# Patient Record
Sex: Female | Born: 1995 | Race: Black or African American | Hispanic: No | Marital: Single | Smoking: Never smoker
Health system: Southern US, Community
[De-identification: ages and names within clinical notes are randomized; demographics above are authoritative.]

## PROBLEM LIST (undated history)

## (undated) ENCOUNTER — Inpatient Hospital Stay (HOSPITAL_COMMUNITY): Payer: Self-pay

## (undated) ENCOUNTER — Ambulatory Visit (HOSPITAL_COMMUNITY): Discharge status: Absent without leave | Payer: Managed Care, Other (non HMO)

## (undated) DIAGNOSIS — A749 Chlamydial infection, unspecified: Secondary | ICD-10-CM

## (undated) DIAGNOSIS — U071 COVID-19: Secondary | ICD-10-CM

## (undated) DIAGNOSIS — A549 Gonococcal infection, unspecified: Secondary | ICD-10-CM

## (undated) DIAGNOSIS — E119 Type 2 diabetes mellitus without complications: Secondary | ICD-10-CM

## (undated) DIAGNOSIS — R87629 Unspecified abnormal cytological findings in specimens from vagina: Secondary | ICD-10-CM

## (undated) DIAGNOSIS — O1490 Unspecified pre-eclampsia, unspecified trimester: Secondary | ICD-10-CM

## (undated) DIAGNOSIS — A6000 Herpesviral infection of urogenital system, unspecified: Secondary | ICD-10-CM

## (undated) DIAGNOSIS — O139 Gestational [pregnancy-induced] hypertension without significant proteinuria, unspecified trimester: Secondary | ICD-10-CM

## (undated) DIAGNOSIS — L309 Dermatitis, unspecified: Secondary | ICD-10-CM

## (undated) DIAGNOSIS — N390 Urinary tract infection, site not specified: Secondary | ICD-10-CM

## (undated) DIAGNOSIS — O24419 Gestational diabetes mellitus in pregnancy, unspecified control: Secondary | ICD-10-CM

## (undated) HISTORY — PX: LYMPHADENECTOMY: SHX15

## (undated) HISTORY — PX: WISDOM TOOTH EXTRACTION: SHX21

## (undated) HISTORY — DX: Unspecified pre-eclampsia, unspecified trimester: O14.90

---
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
## 2016-03-20 ENCOUNTER — Ambulatory Visit (INDEPENDENT_AMBULATORY_CARE_PROVIDER_SITE_OTHER): Payer: Medicaid Other | Admitting: Pediatrics

## 2016-03-20 ENCOUNTER — Ambulatory Visit: Payer: Medicaid Other | Admitting: Pediatrics

## 2016-03-20 ENCOUNTER — Encounter: Payer: Self-pay | Admitting: Pediatrics

## 2016-03-20 VITALS — BP 139/81 | HR 64 | Ht 71.0 in | Wt 263.8 lb

## 2016-03-20 DIAGNOSIS — Z3040 Encounter for surveillance of contraceptives, unspecified: Secondary | ICD-10-CM

## 2016-03-20 DIAGNOSIS — Z113 Encounter for screening for infections with a predominantly sexual mode of transmission: Secondary | ICD-10-CM | POA: Diagnosis not present

## 2016-03-20 NOTE — Addendum Note (Signed)
Addended by: Ardeth SportsmanHODES, Ingrid Shifrin L on: 03/20/2016 02:39 PM   Modules accepted: Orders

## 2016-03-20 NOTE — Progress Notes (Signed)
THIS RECORD MAY CONTAIN CONFIDENTIAL INFORMATION THAT SHOULD NOT BE RELEASED WITHOUT REVIEW OF THE SERVICE PROVIDER.  Adolescent Medicine Consultation Follow-Up Visit Kelli Mcpherson  is a 21 y.o. female referred by [name], [name], MD here today for follow-up regarding nexplanon.    Last seen in [facility] Clinic on 11/15/15 for vaginal discharge.  Plan at last visit included wet prep. Treated for BV.   - Pertinent Labs? No - Growth Chart Viewed? yes   History was provided by the patient and boyfriend.  PCP Confirmed?  yes  My Chart Activated?   yes    Chief Complaint  Patient presents with  . Follow-up    HPI:   Would like nexplanon out. She is gaining weight. She reports not eating any more than usual. Drinks water and fruit punch but not a lot.  Not trying to get pregnant. Boyfriend is here and would like her to keep nexplanon.  Doesn't feel like she would take pills, doesn't want IUD, concerned about depo weight gain.   24 hour recall:  B: didn't eat- late to class  L: fish sticks, salad ([place]), fruit punch, gatorade  D: chili, water  B; didn't eat   Review of Systems  Constitutional: Negative for malaise/fatigue.  Eyes: Negative for double vision.  Respiratory: Negative for shortness of breath.   Cardiovascular: Negative for chest pain and palpitations.  Gastrointestinal: Negative for abdominal pain, constipation, diarrhea, nausea and vomiting.  Genitourinary: Negative for dysuria.  Musculoskeletal: Negative for joint pain and myalgias.  Skin: Negative for rash.  Neurological: Negative for dizziness and headaches.  Endo/Heme/Allergies: Does not bruise/bleed easily.      No LMP recorded. No Known Allergies Outpatient Medications Prior to Visit  Medication Sig Dispense Refill  . ibuprofen (ADVIL,MOTRIN) 600 MG tablet Take 1 tablet (600 mg total) by mouth every 6 (six) hours as needed for fever or mild pain. 30 tablet 0  .  metroNIDAZOLE (FLAGYL) 500 MG tablet Take 1 tablet (500 mg total) by mouth 2 (two) times daily. 14 tablet 0  . naproxen (NAPROSYN) 500 MG tablet Take 1 tablet (500 mg total) by mouth 2 (two) times daily. 30 tablet 0   No facility-administered medications prior to visit.      Patient Active Problem List   Diagnosis Date Noted  . Surveillance of contraceptive implant 11/15/2015  . Obesity 08/01/2015      The following portions of the patient's history were reviewed and updated as appropriate: allergies, current medications, past family history, past medical history, past social history and problem list.  Physical Exam:  Vitals:   03/20/16 1342  BP: 139/81  Pulse: 64  Weight: 263 lb 12.8 oz (119.7 kg)  Height: 5\' 11"  (1.803 m)   BP 139/81 (BP Location: Right Arm, Patient Position: Sitting, Cuff Size: Large)   Pulse 64   Ht 5\' 11"  (1.803 m)   Wt 263 lb 12.8 oz (119.7 kg)   BMI 36.79 kg/m  Body mass index: body mass index is 36.79 kg/m. Blood pressure percentiles are >99 % systolic and 91 % diastolic based on NHBPEP's 4th Report. Blood pressure percentile targets: 90: 127/80, 95: 130/84, 99 + 5 mmHg: 143/97.   Physical Exam  Constitutional: She appears well-developed. No distress.  HENT:  Mouth/Throat: Oropharynx is clear and moist.  Neck: No thyromegaly present.  Cardiovascular: Normal rate and regular rhythm.   No murmur heard. Pulmonary/Chest: Breath sounds normal.  Abdominal: Soft. She exhibits no mass. There is no  tenderness. There is no guarding.  Musculoskeletal: She exhibits no edema.  Lymphadenopathy:    She has no cervical adenopathy.  Neurological: She is alert.  Skin: Skin is warm. No rash noted.  Psychiatric: She has a normal mood and affect.  Nursing note and vitals reviewed.   Assessment/Plan: 1. Surveillance of contraceptive implant Nexplanon in good position in LUE. Has had some weight gain since placement but also continues in college with fairly  poor eating habits. Has gained weight consistently since 2016 prior to nexplanon. Discussed at length today- she may consider keeping it. Does not desire pregnancy but didn't feel committal to another form of contraception today. She will come back for removal if she would like.    Follow-up:  PRN for nexplanon removal   Medical decision-making:  >15 minutes spent face to face with patient with more than 50% of appointment spent discussing diagnosis, management, follow-up, and reviewing the plan of care as noted above.

## 2016-03-20 NOTE — Patient Instructions (Signed)
Come back and see us if you still want your nexplanon out  Consider exercising more and limiting sugary beverages

## 2016-03-21 LAB — GC/CHLAMYDIA PROBE AMP
CT PROBE, AMP APTIMA: NOT DETECTED
GC PROBE AMP APTIMA: NOT DETECTED

## 2016-08-20 ENCOUNTER — Ambulatory Visit (INDEPENDENT_AMBULATORY_CARE_PROVIDER_SITE_OTHER): Payer: Medicaid Other | Admitting: Student

## 2016-08-20 ENCOUNTER — Encounter: Payer: Self-pay | Admitting: Student

## 2016-08-20 VITALS — Temp 98.0°F | Wt 280.0 lb

## 2016-08-20 DIAGNOSIS — R197 Diarrhea, unspecified: Secondary | ICD-10-CM | POA: Diagnosis not present

## 2016-08-20 DIAGNOSIS — N760 Acute vaginitis: Secondary | ICD-10-CM

## 2016-08-20 DIAGNOSIS — Z3202 Encounter for pregnancy test, result negative: Secondary | ICD-10-CM | POA: Diagnosis not present

## 2016-08-20 DIAGNOSIS — N946 Dysmenorrhea, unspecified: Secondary | ICD-10-CM

## 2016-08-20 LAB — POCT URINE PREGNANCY: PREG TEST UR: NEGATIVE

## 2016-08-20 MED ORDER — FLUCONAZOLE 100 MG PO TABS: 150.0000 mg | ORAL_TABLET | Freq: Once | ORAL | 0 refills | Status: AC

## 2016-08-20 NOTE — Patient Instructions (Addendum)
You were seen today for your cramping, diarrhea, vomiting, and vaginal rash.  We will treat you for a yeast infection and will let you know about your test results. If you have any questions or worsening of any of your symptoms, please call us back or return to care.  The best website for information about children is [email or web address].[email or web address].  All the information is reliable and up-to-date.    At every age, encourage reading.  Reading with your child is one of the best activities you can do.   Use the [facility] near your home and borrow new books every week!  Call the main number [phone] before going to the Emergency Department unless it's a true emergency.  For a true emergency, [facility].   A nurse always answers the main number [phone] and a doctor is always available, even when the clinic is closed.    Clinic is open for sick visits only on Saturday mornings from 8:30AM to 12:30PM. Call first thing on Saturday morning for an appointment.

## 2016-08-20 NOTE — Progress Notes (Signed)
   Subjective:     Kelli Mcpherson, is a 21 y.o. female   History provider by patient No interpreter necessary.  Chief Complaint  Patient presents with  . Diarrhea    x1week  . Vaginal Itching    pt is currently on cylcle but stated that she thinks it could be the pads that she uses    HPI: Kelli Mcpherson is a 20yo with a Nexplanon in place presenting with heavy vaginal bleeding, abdominal cramping, diarrhea, nausea, and vomiting. Since her nexplanon was placed last fall she gets a period almost every month but it is normally light and lasts 2-3 days.   Her period started 6 days ago and she is still on her period today. The bleeding has been much heavier than normal. Has also been associated with cramping, nausea. She also has a vaginal rash that is irritating. She says the rash is painful, itchy, and red and inflamed. A few days before her period started she had a new yellow cloudy mucous vaginal discharge. She was last sexually active two weeks ago with a new partner. She reports using condoms. She denies new feminine products or soaps.  Also reports diarrhea that is frequent and liquidy. Had one episode of NBNB emesis today. Has been able to eat and drink normally.  Review of Systems  Constitutional: Negative for activity change, appetite change and fever.  HENT: Positive for rhinorrhea.   Respiratory: Negative for shortness of breath.   Cardiovascular: Negative for chest pain.  Gastrointestinal: Positive for diarrhea, nausea and vomiting. Negative for blood in stool.  Genitourinary: Positive for pelvic pain and vaginal discharge. Negative for dysuria.  Musculoskeletal: Negative for arthralgias.  Skin: Negative for rash.  Neurological: Negative for headaches.     Patient's history was reviewed and updated as appropriate: allergies, current medications, past medical history, past social history and problem list.     Objective:     Temp 98 F (36.7 C)   Wt 280 lb (127 kg)    LMP 08/14/2016   BMI 39.05 kg/m   Physical Exam  Constitutional:  Obese female, not in distress  HENT:  Head: Normocephalic and atraumatic.  Moist mucus membranes  Eyes: EOM are normal.  Neck: Normal range of motion.  Cardiovascular: Normal rate, regular rhythm and normal heart sounds.   No murmur heard. Pulmonary/Chest: Effort normal and breath sounds normal. She has no wheezes. She has no rales.  Abdominal: Soft. Bowel sounds are normal. She exhibits no distension. There is no tenderness.  Genitourinary:  Genitourinary Comments: Inflamed, pink maculopapular rash posterior to vulva on inferior buttocks  Musculoskeletal: Normal range of motion.  Neurological: She is alert.  Skin: Skin is warm and dry.       Assessment & Plan:   1. Dysmenorrhea in adolescent - POCT urine pregnancy  2. Acute vaginitis - Rash seems consistent with yeast infection. Patient's symptoms of recent vaginal discharge and itchiness in setting of new sexual partner warrant further testing. - WET PREP BY MOLECULAR PROBE - GC/Chlamydia Probe Amp - fluconazole (DIFLUCAN) 100 MG tablet; Take 1.5 tablets (150 mg total) by mouth once.  Dispense: 1.5 tablet; Refill: 0  3. Diarrhea, unspecified type - Likely diarrhea is unrelated to GU symptoms. Most likely cause of diarrhea is viral infection. Reviewed return precautions.  Supportive care and return precautions reviewed.  Return if symptoms worsen or fail to improve.  Randolm IdolSarah Bailie Christenbury, MD [facility], PGY-2 08/20/2016

## 2016-08-21 LAB — WET PREP BY MOLECULAR PROBE
CANDIDA SPECIES: NOT DETECTED
Gardnerella vaginalis: NOT DETECTED
Trichomonas vaginosis: DETECTED — AB

## 2016-08-21 LAB — GC/CHLAMYDIA PROBE AMP
CT Probe RNA: DETECTED — AB
GC Probe RNA: NOT DETECTED

## 2016-08-21 NOTE — Progress Notes (Signed)
Phone call to Kelli SquiresShaquasia was not answered.  Voicemail left without details, with promise to send message by MyChart.  Infection needs treatment, which can be either one pill twice a day for 7 days, or larger dose once.  [facility]   Also promised to call again on Thursday afternoon from the office.

## 2016-08-22 ENCOUNTER — Other Ambulatory Visit: Payer: Self-pay | Admitting: Pediatrics

## 2016-08-22 DIAGNOSIS — A599 Trichomoniasis, unspecified: Secondary | ICD-10-CM

## 2016-08-22 MED ORDER — FLUCONAZOLE 150 MG PO TABS: 150.0000 mg | ORAL_TABLET | Freq: Once | ORAL | 0 refills | Status: AC

## 2016-08-22 MED ORDER — METRONIDAZOLE 500 MG PO TABS: 500.0000 mg | ORAL_TABLET | Freq: Two times a day (BID) | ORAL | 0 refills | Status: DC

## 2016-08-22 NOTE — Progress Notes (Signed)
Spoke with Kelli Mcpherson, who said she prefers metronidazole 7-day treatment over single dose. Also Diflucan as written was not covered by Medicaid, according to pharmacy.  Will reorder as generic with specific note in prescription.  August 20, 2016 [facility] generic but not Diflucan.

## 2016-08-25 ENCOUNTER — Encounter: Payer: Self-pay | Admitting: Pediatrics

## 2016-08-25 ENCOUNTER — Ambulatory Visit (INDEPENDENT_AMBULATORY_CARE_PROVIDER_SITE_OTHER): Payer: Medicaid Other | Admitting: Pediatrics

## 2016-08-25 VITALS — Temp 98.9°F | Wt 274.8 lb

## 2016-08-25 DIAGNOSIS — A64 Unspecified sexually transmitted disease: Secondary | ICD-10-CM

## 2016-08-25 NOTE — Patient Instructions (Signed)
You have been treated today for chlamydia and you have medicine to treat your last partner. The Health Department may contact you to give and get more information.   Chlamydia, Female Chlamydia is an STD (sexually transmitted disease). This is an infection that spreads through sexual contact. If it is not treated, it can cause serious problems. It must be treated with antibiotic medicine. Sometimes, you may not have symptoms (asymptomatic). When you have symptoms, they can include:  Burning when you pee (urinate).  Peeing often.  Fluid (discharge) coming from the vagina.  Redness, soreness, and swelling (inflammation) of the butt (rectum).  Bleeding or fluid coming from the butt.  Belly (abdominal) pain.  Pain during sex.  Bleeding between periods.  Itching, burning, or redness in the eyes.  Fluid coming from the eyes.  Follow these instructions at home: Medicines  Take over-the-counter and prescription medicines only as told by your doctor.  Take your antibiotic medicine as told by your doctor. Do not stop taking the antibiotic even if you start to feel better. Sexual activity  Tell sex partners about your infection. Sex partners are people you had oral, anal, or vaginal sex with within 60 days of when you started getting sick. They need treatment, too.  Do not have sex until: ? You and your sex partners have been treated. ? Your doctor says it is okay.  If you have a single dose treatment, wait 7 days before having sex. General instructions  It is up to you to get your test results. Ask your doctor when your results will be ready.  Get a lot of rest.  Eat healthy foods.  Drink enough fluid to keep your pee (urine) clear or pale yellow.  Keep all follow-up visits as told by your doctor. You may need tests after 3 months. Preventing chlamydia  The only way to prevent chlamydia is not to have sex. To lower your risk: ? Use latex condoms correctly. Do this every  time you have sex. ? Avoid having many sex partners. ? Ask if your partner has been tested for STDs and if he or she had negative results. Contact a doctor if:  You get new symptoms.  You do not get better with treatment.  You have a fever or chills.  You have pain during sex. Get help right away if:  Your pain gets worse and does not get better with medicine.  You get flu-like symptoms, such as: ? Night sweats. ? Sore throat. ? Muscle aches.  You feel sick to your stomach (nauseous).  You throw up (vomit).  You have trouble swallowing.  You have bleeding: ? Between periods. ? After sex.  You have irregular periods.  You have belly pain that does not get better with medicine.  You have lower back pain that does not get better with medicine.  You feel weak or dizzy.  You pass out (faint).  You are pregnant and you get symptoms of chlamydia. Summary  Chlamydia is an infection that spreads through sexual contact.  Sometimes, chlamydia can cause no symptoms (asymptomatic).  Do not have sex until your doctor says it is okay.  All sex partners will have to be treated for chlamydia.

## 2016-08-25 NOTE — Progress Notes (Signed)
    Assessment and Plan:     1. STI (sexually transmitted infection) x 2  Taking medication for Trich as ordered Directly observed therapy for chlamydia today EPT given for partner Stressed condom use  [facility] report faxed  Return in about 3 weeks (around 09/15/2016) for medication response follow up with Dr Lubertha SouthProse.    Subjective:  HPI Kelli Mcpherson is a 21 y.o. old female here with herself  Chief Complaint  Patient presents with  . Follow-up   Positive chlamydia result after communications about wet prep positive for Trich and prescription successfully made Feeling much better with less itching Diarrhea and nausea had already cleared  Immunizations, medications and allergies were reviewed and updated. Family history and social history were reviewed and updated.   Review of Systems No fever No back pain No abdominal pain No sore throat  History and Problem List: Kelli Mcpherson has Obesity and Surveillance of contraceptive implant on her problem list.  Kelli Mcpherson  has a past medical history of Eczema.  Objective:   Temp 98.9 F (37.2 C) (Temporal)   Wt 274 lb 12.8 oz (124.6 kg)   LMP 08/20/2016   BMI 38.33 kg/m  Physical Exam  Constitutional: She is oriented to person, place, and time. She appears well-developed.  Obese  HENT:  Left Ear: External ear normal.  Nose: Nose normal.  Mouth/Throat: Oropharynx is clear and moist.  Eyes: Conjunctivae and EOM are normal.  Neck: Neck supple. No thyromegaly present.  Cardiovascular: Normal rate, regular rhythm and normal heart sounds.   Pulmonary/Chest: Effort normal and breath sounds normal.  Abdominal: Soft. Bowel sounds are normal. There is no tenderness.  Neurological: She is alert and oriented to person, place, and time.  Skin: Skin is warm and dry. No rash noted.  Nursing note and vitals reviewed.   Leda MinPROSE, CLAUDIA, MD

## 2016-09-15 ENCOUNTER — Ambulatory Visit: Payer: Self-pay

## 2016-09-15 ENCOUNTER — Ambulatory Visit: Payer: Medicaid Other | Admitting: Pediatrics

## 2016-09-16 ENCOUNTER — Ambulatory Visit: Payer: Medicaid Other

## 2016-09-16 DIAGNOSIS — Z113 Encounter for screening for infections with a predominantly sexual mode of transmission: Secondary | ICD-10-CM

## 2016-09-16 NOTE — Progress Notes (Signed)
Here for STD treatment follow up from 08/25/16. Patient completed medication and is not having any current symptoms. Urine collected.

## 2016-09-17 LAB — GC/CHLAMYDIA PROBE AMP
CT Probe RNA: NOT DETECTED
GC Probe RNA: NOT DETECTED

## 2016-09-17 NOTE — Progress Notes (Signed)
Test from yesterday shows NO infection.  You did a good job taking the medicine and staying safe.   Call if and when you need to.

## 2016-10-23 ENCOUNTER — Ambulatory Visit: Payer: Self-pay | Admitting: Pediatrics

## 2016-10-23 ENCOUNTER — Telehealth: Payer: Self-pay

## 2016-10-23 NOTE — Telephone Encounter (Addendum)
Mom called very upset because patient was unable to be seen today for Nexplanon removal. Explained to pt that if patient consents to Nexplanon removal that procedure may be attempted that day. Reassured patient that because appointment note states "follow up" that doesn't mean procedure will not be done that day. Pt voiced understanding and plans to be at appt on 10/5.

## 2016-10-24 ENCOUNTER — Ambulatory Visit (INDEPENDENT_AMBULATORY_CARE_PROVIDER_SITE_OTHER): Payer: Medicaid Other | Admitting: Family

## 2016-10-24 ENCOUNTER — Encounter: Payer: Self-pay | Admitting: Family

## 2016-10-24 VITALS — BP 146/81 | HR 86 | Ht 72.24 in | Wt 285.8 lb

## 2016-10-24 DIAGNOSIS — Z30432 Encounter for removal of intrauterine contraceptive device: Secondary | ICD-10-CM

## 2016-10-24 DIAGNOSIS — Z3046 Encounter for surveillance of implantable subdermal contraceptive: Secondary | ICD-10-CM | POA: Diagnosis not present

## 2016-10-24 NOTE — Patient Instructions (Signed)
Follow-up  in 1 month. Schedule this appointment before you leave clinic today.  Your Nexplanon was removed today and is no longer preventing pregnancy.  If you have sex, remember to use condoms to prevent pregnancy and to prevent sexually transmitted infections.  Leave the outside bandage on for 24 hours.  Leave the smaller bandages on for 3-5 days or until they fall off on their own.  Keep the area clean and dry for 3-5 days.  There is usually bruising or swelling at and around the removal site for a few days to a week after the removal.  If you see redness or pus draining from the removal site, call us immediately.  We would like you to return to the clinic for a follow-up visit in 1 month.  You can call [facility] 24 hours a day with any questions or concerns.  There is always a nurse or doctor available to take your call.  Call 9-1-1 if you have a life-threatening emergency.  For anything else, please call us at [phone] before heading to the ER.  

## 2016-10-27 ENCOUNTER — Encounter: Payer: Self-pay | Admitting: Family

## 2016-10-27 NOTE — Progress Notes (Signed)
THIS RECORD MAY CONTAIN CONFIDENTIAL INFORMATION THAT SHOULD NOT BE RELEASED WITHOUT REVIEW OF THE SERVICE PROVIDER.  Adolescent Medicine Consultation Follow-Up Visit Kelli Mcpherson  is a 21 y.o. female referred by Tilman Neat, MD here today for follow-up regarding nexplanon.    Last seen in [facility] on 03/20/16 for nexplanon surveillance.   Pertinent Labs? No Growth Chart Viewed? no   History was provided by the patient.  Interpreter? no  PCP Confirmed?  yes  My Chart Activated?   yes    Chief Complaint  Patient presents with  . Follow-up    would like nexplanon removed    HPI:    -desire nexplanon out -does not want another form of birth control  -sexually active without pregnancy intent -declines further conversation regarding methods.   Review of Systems  Constitutional: Negative for fever and malaise/fatigue.  Eyes: Negative for double vision.  Cardiovascular: Negative for chest pain.  Gastrointestinal: Negative for abdominal pain and nausea.  Skin: Negative for rash.  Neurological: Negative for dizziness, tingling and tremors.  Endo/Heme/Allergies: Does not bruise/bleed easily.  Psychiatric/Behavioral: Negative for depression and suicidal ideas.   No LMP recorded. No Known Allergies No outpatient prescriptions prior to visit.   No facility-administered medications prior to visit.      Patient Active Problem List   Diagnosis Date Noted  . Surveillance of contraceptive implant 11/15/2015  . Obesity 08/01/2015  Physical Exam:  Vitals:   10/24/16 1555  BP: (!) 146/81  Pulse: 86  Weight: 285 lb 12.8 oz (129.6 kg)  Height: 6' 0.24" (1.835 m)   BP (!) 146/81 (BP Location: Right Arm, Patient Position: Sitting, Cuff Size: Normal)   Pulse 86   Ht 6' 0.24" (1.835 m)   Wt 285 lb 12.8 oz (129.6 kg)   BMI 38.50 kg/m  Body mass index: body mass index is 38.5 kg/m. Blood pressure percentiles are >99 % systolic and 92 % diastolic  based on the August 2017 AAP Clinical Practice Guideline. Blood pressure percentile targets: 90: 128/78, 95: 130/81, 95 + 12 mmHg: 142/93. This reading is in the Stage 2 hypertension range (BP >= 140/90).   Physical Exam  Constitutional: She is oriented to person, place, and time. She appears well-developed. No distress.  Eyes: No scleral icterus.  Cardiovascular: Normal rate and regular rhythm.   No murmur heard. Pulmonary/Chest: Effort normal and breath sounds normal.  Musculoskeletal: Normal range of motion. She exhibits no edema.  Neurological: She is alert and oriented to person, place, and time. No cranial nerve deficit.  Skin: Skin is warm and dry. No rash noted.  nexplanon palpable at correct site just prior to removal. See procedure note.   Psychiatric: She has a normal mood and affect.   Assessment/Plan: 1. Encounter for Nexplanon removal Risks & benefits of Nexplanon removal discussed. Consent form signed.  The patient denies any allergies to anesthetics or antiseptics.  Procedure: Pt was placed in supine position. left arm was flexed at the elbow and externally rotated so that her wrist was parallel to her ear, The device was palpated and marked. The site was cleaned with Betadine. The area surrounding the device was covered with a sterile drape. 1% lidocaine was injected just under the device. A scalpel was used to create a small incision. The device was pushed towards the incision. Fibrous tissue surrounding the device was gradually removed from the device. The device was removed and measured to ensure all 4 cm of device was removed.  Steri-strips were used to close the incision. Pressure dressing was applied to the patient.  The patient was instructed to removed the pressure dressing in 24 hrs.  The patient was advised to move slowly from a supine to an upright position  The patient denied any concerns or complaints  The patient was instructed to schedule a  follow-up appt in 1 month. The patient will be called in 1 week to address any concerns.   Follow-up:  11/28/16 at 2pm with Christianne Dolin, FNP-C  Medical decision-making:  >15 minutes spent face to face with patient with more than 50% of appointment spent discussing procedure, return precautions, and follow up care.

## 2016-11-17 ENCOUNTER — Encounter: Payer: Self-pay | Admitting: Pediatrics

## 2016-11-17 ENCOUNTER — Ambulatory Visit (INDEPENDENT_AMBULATORY_CARE_PROVIDER_SITE_OTHER): Payer: Medicaid Other | Admitting: Pediatrics

## 2016-11-17 VITALS — BP 118/78 | Ht 72.24 in | Wt 275.0 lb

## 2016-11-17 DIAGNOSIS — L309 Dermatitis, unspecified: Secondary | ICD-10-CM | POA: Diagnosis not present

## 2016-11-17 DIAGNOSIS — Z113 Encounter for screening for infections with a predominantly sexual mode of transmission: Secondary | ICD-10-CM | POA: Diagnosis not present

## 2016-11-17 DIAGNOSIS — N760 Acute vaginitis: Secondary | ICD-10-CM

## 2016-11-17 DIAGNOSIS — Z3202 Encounter for pregnancy test, result negative: Secondary | ICD-10-CM

## 2016-11-17 LAB — POCT URINE PREGNANCY: Preg Test, Ur: NEGATIVE

## 2016-11-17 MED ORDER — TRIAMCINOLONE ACETONIDE 0.1 % EX OINT: 1.0000 "application " | TOPICAL_OINTMENT | Freq: Two times a day (BID) | CUTANEOUS | 4 refills | Status: DC

## 2016-11-17 NOTE — Patient Instructions (Signed)

## 2016-11-17 NOTE — Progress Notes (Signed)
    Subjective:    Kelli Mcpherson is a 21 y.o. female accompanied by self presenting to the clinic today with a chief c/o of vaginal discharge last week that seems to have resolved. She had some itching & discharge but not anymore.  LMP last week & this was the 1st cycle after removal of Nexplanon. She had it taken out on 10/24/16 due to irregular spotting & also thought it was causing weight gain. No c/o abdominal pain, no dysuria or dyspareunia. Pt is sexually active & using condoms. Does not want any alternate birth control. She has lost 10 lbs since her nexplanon was taken out & was resistant to discussing birth control options.  She also wanted medications for eczema flare up. Prev on hydrocortisone but not helping. Usually has flare up in the winter- mostly hands.  Review of Systems  Constitutional: Negative for activity change, appetite change, fatigue and fever.  HENT: Negative for congestion.   Respiratory: Negative for cough, shortness of breath and wheezing.   Gastrointestinal: Negative for abdominal pain, diarrhea, nausea and vomiting.  Genitourinary: Positive for vaginal discharge. Negative for difficulty urinating, dyspareunia and dysuria.  Skin: Negative for rash.  Neurological: Negative for headaches.  Psychiatric/Behavioral: Negative for sleep disturbance.       Objective:   Physical Exam  Constitutional: She appears well-nourished. No distress.  HENT:  Head: Normocephalic and atraumatic.  Right Ear: External ear normal.  Left Ear: External ear normal.  Nose: Nose normal.  Mouth/Throat: Oropharynx is clear and moist.  Eyes: Conjunctivae and EOM are normal. Right eye exhibits no discharge. Left eye exhibits no discharge.  Neck: Normal range of motion.  Cardiovascular: Normal rate, regular rhythm and normal heart sounds.   Pulmonary/Chest: No respiratory distress. She has no wheezes. She has no rales.  Abdominal: Soft. There is no tenderness. There is no  rebound and no guarding.  Skin: Skin is warm and dry. No rash noted.  Nursing note and vitals reviewed.  .BP 118/78   Ht 6' 0.24" (1.835 m)   Wt 275 lb (124.7 kg)   LMP 10/27/2016   BMI 37.05 kg/m         Assessment & Plan:  1. Acute vaginitis Discussed hygiene & avoid douching - WET PREP BY MOLECULAR PROBE STI screen done - C. trachomatis/N. gonorrhoeae RNA - POCT urine pregnancy: negative  2. Eczema, unspecified type Skin care discussed - triamcinolone ointment (KENALOG) 0.1 %; Apply 1 application topically 2 (two) times daily.  Dispense: 80 g; Refill: 4  Patient wanted to cancel [facility] appt as she is not interested in discussing birth control options.  Return if symptoms worsen or fail to improve.  Tobey BrideShruti Ziggy Chanthavong, MD 11/17/2016 3:29 PM

## 2016-11-18 LAB — C. TRACHOMATIS/N. GONORRHOEAE RNA
C. TRACHOMATIS RNA, TMA: DETECTED — AB
N. gonorrhoeae RNA, TMA: NOT DETECTED

## 2016-11-18 LAB — WET PREP BY MOLECULAR PROBE
Candida species: NOT DETECTED
MICRO NUMBER: 81209378
SPECIMEN QUALITY: ADEQUATE
Trichomonas vaginosis: NOT DETECTED

## 2016-11-19 ENCOUNTER — Other Ambulatory Visit: Payer: Self-pay | Admitting: Pediatrics

## 2016-11-19 DIAGNOSIS — N760 Acute vaginitis: Principal | ICD-10-CM

## 2016-11-19 DIAGNOSIS — B9689 Other specified bacterial agents as the cause of diseases classified elsewhere: Secondary | ICD-10-CM

## 2016-11-19 DIAGNOSIS — A64 Unspecified sexually transmitted disease: Secondary | ICD-10-CM

## 2016-11-19 MED ORDER — METRONIDAZOLE 500 MG PO TABS: 500.0000 mg | ORAL_TABLET | Freq: Two times a day (BID) | ORAL | 0 refills | Status: AC

## 2016-11-19 MED ORDER — AZITHROMYCIN 1 G PO PACK: 1.0000 g | PACK | Freq: Once | ORAL | 0 refills | Status: AC

## 2016-11-19 NOTE — Progress Notes (Signed)
VM not set up. Will try again later.

## 2016-11-20 ENCOUNTER — Encounter: Payer: Self-pay | Admitting: Pediatrics

## 2016-11-20 ENCOUNTER — Ambulatory Visit (INDEPENDENT_AMBULATORY_CARE_PROVIDER_SITE_OTHER): Payer: Medicaid Other | Admitting: Pediatrics

## 2016-11-20 VITALS — Temp 98.3°F | Wt 282.2 lb

## 2016-11-20 DIAGNOSIS — A64 Unspecified sexually transmitted disease: Secondary | ICD-10-CM | POA: Diagnosis not present

## 2016-11-20 MED ORDER — AZITHROMYCIN 250 MG PO TABS: 1000.0000 mg | ORAL_TABLET | Freq: Every day | ORAL | Status: DC

## 2016-11-20 NOTE — Progress Notes (Signed)
    Assessment and Plan:     1. Sexually transmitted infection Chlamydia treated with azithromycin 1000 mg.  Witnessed.   Partner therapy given and witnessed. Medication for BV to be picked up and started today. - azithromycin (ZITHROMAX) tablet 1,000 mg; Take 4 tablets (1,000 mg total) by mouth daily. Package of condoms given to each Check with Dr Wynetta EmerySimha if [facility] needs [facility].  Too old for flu shot in clinic.  Return for any new symptoms or concerns.    Subjective:  HPI Kelli Mcpherson is a 21 y.o. old female here with boyfriend Kelli Mcpherson  Chief Complaint  Patient presents with  . Follow-up    Follow Up for infection    Feeling fine.  Last infection felt a little different. Plans to get medication ordered by Dr Wynetta EmerySimha for BV but wanted to come for the other infection (chlamydia) Told Dr Wynetta EmerySimha that she would come to clinic for medicine Came to clinic with partner Kelli Mcpherson so he can get treated Kelli Mcpherson has been incarcerated (no details) and recalls judge told him he cannot vote Neither volunteers info about any other partners  Fever: no Change in appetite: no Change in sleep: no Change in breathing: no Vomiting: no Diarrhea: no Change in stool: no Change in urine: no Change in skin: no  Sick contacts:  Kelli Mcpherson Smoke: no Travel: no  Immunizations, medications and allergies were reviewed and updated. Family history and social history were reviewed and updated.   Review of Systems See HPI  History and Problem List: Kelli Mcpherson has Obesity on her problem list.  Kelli Mcpherson  has a past medical history of Eczema.  Objective:   Temp 98.3 F (36.8 C) (Temporal)   Wt 282 lb 3.2 oz (128 kg)   LMP 10/27/2016   BMI 38.02 kg/m  Physical Exam  Constitutional: She is oriented to person, place, and time. She appears well-developed.  Obese, agreeable  HENT:  Right Ear: External ear normal.  Left Ear: External ear normal.  Nose: Nose normal.  Mouth/Throat:  Oropharynx is clear and moist.  Eyes: Conjunctivae and EOM are normal.  Neck: Neck supple. No thyromegaly present.  Cardiovascular: Normal rate, regular rhythm and normal heart sounds.   Pulmonary/Chest: Effort normal and breath sounds normal.  Abdominal: Soft. Bowel sounds are normal.  Genitourinary:  Genitourinary Comments: Deferred.  Police presence in clinic due to suspicion for bomb package sent to one doctor.   Neurological: She is alert and oriented to person, place, and time.  Skin: Skin is warm and dry. No rash noted.  Nursing note and vitals reviewed.   Leda MinPROSE, CLAUDIA, MD

## 2016-11-20 NOTE — Patient Instructions (Signed)
Be sure and get the medication ordered by Dr Wynetta EmerySimha.  Take it for the full 7days. Come back whenever you feel not right, or either you or [name] has a different partner.

## 2016-11-28 ENCOUNTER — Ambulatory Visit: Payer: Medicaid Other | Admitting: Family

## 2017-02-25 ENCOUNTER — Ambulatory Visit
Admission: RE | Admit: 2017-02-25 | Discharge: 2017-02-25 | Disposition: A | Discharge status: Home / Self Care | Payer: Medicaid Other | Source: Ambulatory Visit | Attending: Pediatrics | Admitting: Pediatrics

## 2017-02-25 ENCOUNTER — Ambulatory Visit (INDEPENDENT_AMBULATORY_CARE_PROVIDER_SITE_OTHER): Payer: Self-pay | Admitting: Pediatrics

## 2017-02-25 VITALS — BP 134/94 | HR 99 | Temp 98.6°F | Wt 277.6 lb

## 2017-02-25 DIAGNOSIS — R05 Cough: Secondary | ICD-10-CM

## 2017-02-25 DIAGNOSIS — R059 Cough, unspecified: Secondary | ICD-10-CM

## 2017-02-25 DIAGNOSIS — Z113 Encounter for screening for infections with a predominantly sexual mode of transmission: Secondary | ICD-10-CM

## 2017-02-25 DIAGNOSIS — B9789 Other viral agents as the cause of diseases classified elsewhere: Secondary | ICD-10-CM

## 2017-02-25 DIAGNOSIS — J069 Acute upper respiratory infection, unspecified: Secondary | ICD-10-CM

## 2017-02-25 MED ORDER — IBUPROFEN 200 MG PO TABS: 800.0000 mg | ORAL_TABLET | Freq: Three times a day (TID) | ORAL | 0 refills | Status: DC | PRN

## 2017-02-25 NOTE — Patient Instructions (Signed)

## 2017-02-25 NOTE — Progress Notes (Signed)
  History was provided by the patient.  No interpreter necessary.  Kelli Mcpherson is a 22 y.o. female presents for  Chief Complaint  Patient presents with  . Cough    since monday no meds taken today  . Headache  . Breathing Problem   For 3 days has had about symptoms.  Headache is frontal. Not taking any medications for symptoms.  Chest pain over the sternum. No fevers.    The following portions of the patient's history were reviewed and updated as appropriate: allergies, current medications, past family history, past medical history, past social history, past surgical history and problem list.  Review of Systems  Constitutional: Negative for fever.  HENT: Positive for congestion. Negative for ear discharge and ear pain.   Eyes: Negative for pain and discharge.  Respiratory: Positive for cough. Negative for shortness of breath and wheezing.   Gastrointestinal: Negative for diarrhea and vomiting.  Skin: Negative for rash.  Neurological: Positive for headaches.     Physical Exam:  BP (!) 134/94   Pulse 99   Temp 98.6 F (37 C) (Temporal)   Wt 277 lb 9.6 oz (125.9 kg)   LMP 02/18/2017   SpO2 94%   BMI 37.40 kg/m  Growth percentile SmartLinks can only be used for patients less than 21 years old. Wt Readings from Last 3 Encounters:  02/25/17 277 lb 9.6 oz (125.9 kg)  11/20/16 282 lb 3.2 oz (128 kg) (>99 %, Z= 2.76)*  11/17/16 275 lb (124.7 kg) (>99 %, Z= 2.71)*   * Growth percentiles are based on CDC (Girls, 2-20 Years) data.   HR: 90 RR: 18  General:   alert, cooperative, appears stated age and no distress  Oral cavity:   lips, mucosa, and tongue normal; moist mucus membranes   EENT:   sclerae white, normal TM bilaterally, no drainage from nares, tonsils are normal, no cervical lymphadenopathy   Lungs:  clear to auscultation bilaterally, tenderness over the sternum   Heart:   regular rate and rhythm, S1, S2 normal, no murmur, click, rub or gallop       Assessment/Plan: 1. Viral URI with cough Xray was negative.  Chest pain is most likely musculoskeletal so wrote script for motrin at home.   - DG Chest 2 View; Future - ibuprofen (MOTRIN IB) 200 MG tablet; Take 4 tablets (800 mg total) by mouth every 8 (eight) hours as needed for mild pain.  Dispense: 30 tablet; Refill: 0  2. Routine screening for STI (sexually transmitted infection) Positive chlamydia at the last visit.  - C. trachomatis/N. gonorrhoeae RNA     Cherece Griffith CitronNicole Grier, MD  02/25/17

## 2017-02-26 ENCOUNTER — Encounter: Payer: Self-pay | Admitting: Pediatrics

## 2017-02-26 LAB — C. TRACHOMATIS/N. GONORRHOEAE RNA
C. trachomatis RNA, TMA: NOT DETECTED
N. gonorrhoeae RNA, TMA: NOT DETECTED

## 2017-03-27 ENCOUNTER — Other Ambulatory Visit: Payer: Self-pay

## 2017-03-27 ENCOUNTER — Encounter: Payer: Self-pay | Admitting: Pediatrics

## 2017-03-27 ENCOUNTER — Ambulatory Visit (INDEPENDENT_AMBULATORY_CARE_PROVIDER_SITE_OTHER): Payer: Medicaid Other | Admitting: Pediatrics

## 2017-03-27 VITALS — BP 120/90 | Temp 97.2°F | Wt 279.2 lb

## 2017-03-27 DIAGNOSIS — Z3202 Encounter for pregnancy test, result negative: Secondary | ICD-10-CM

## 2017-03-27 DIAGNOSIS — Z3049 Encounter for surveillance of other contraceptives: Secondary | ICD-10-CM

## 2017-03-27 DIAGNOSIS — Z113 Encounter for screening for infections with a predominantly sexual mode of transmission: Secondary | ICD-10-CM | POA: Diagnosis not present

## 2017-03-27 DIAGNOSIS — Z3009 Encounter for other general counseling and advice on contraception: Secondary | ICD-10-CM

## 2017-03-27 LAB — POCT URINE PREGNANCY: PREG TEST UR: NEGATIVE

## 2017-03-27 NOTE — Patient Instructions (Signed)
Contraception Choices Contraception, also called birth control, refers to methods or devices that prevent pregnancy. Hormonal methods Contraceptive implant A contraceptive implant is a thin, plastic tube that contains a hormone. It is inserted into the upper part of the arm. It can remain in place for up to 3 years. Progestin-only injections Progestin-only injections are injections of progestin, a synthetic form of the hormone progesterone. They are given every 3 months by a health care provider. Birth control pills Birth control pills are pills that contain hormones that prevent pregnancy. They must be taken once a day, preferably at the same time each day. Birth control patch The birth control patch contains hormones that prevent pregnancy. It is placed on the skin and must be changed once a week for three weeks and removed on the fourth week. A prescription is needed to use this method of contraception. Vaginal ring A vaginal ring contains hormones that prevent pregnancy. It is placed in the vagina for three weeks and removed on the fourth week. After that, the process is repeated with a new ring. A prescription is needed to use this method of contraception. Emergency contraceptive Emergency contraceptives prevent pregnancy after unprotected sex. They come in pill form and can be taken up to 5 days after sex. They work best the sooner they are taken after having sex. Most emergency contraceptives are available without a prescription. This method should not be used as your only form of birth control. Barrier methods Female condom A female condom is a thin sheath that is worn over the penis during sex. Condoms keep sperm from going inside a woman's body. They can be used with a spermicide to increase their effectiveness. They should be disposed after a single use. Female condom A female condom is a soft, loose-fitting sheath that is put into the vagina before sex. The condom keeps sperm from going  inside a woman's body. They should be disposed after a single use. Diaphragm A diaphragm is a soft, dome-shaped barrier. It is inserted into the vagina before sex, along with a spermicide. The diaphragm blocks sperm from entering the uterus, and the spermicide kills sperm. A diaphragm should be left in the vagina for 6-8 hours after sex and removed within 24 hours. A diaphragm is prescribed and fitted by a health care provider. A diaphragm should be replaced every 1-2 years, after giving birth, after gaining more than 15 lb (6.8 kg), and after pelvic surgery. Cervical cap A cervical cap is a round, soft latex or plastic cup that fits over the cervix. It is inserted into the vagina before sex, along with spermicide. It blocks sperm from entering the uterus. The cap should be left in place for 6-8 hours after sex and removed within 48 hours. A cervical cap must be prescribed and fitted by a health care provider. It should be replaced every 2 years. Sponge A sponge is a soft, circular piece of polyurethane foam with spermicide on it. The sponge helps block sperm from entering the uterus, and the spermicide kills sperm. To use it, you make it wet and then insert it into the vagina. It should be inserted before sex, left in for at least 6 hours after sex, and removed and thrown away within 30 hours. Spermicides Spermicides are chemicals that kill or block sperm from entering the cervix and uterus. They can come as a cream, jelly, suppository, foam, or tablet. A spermicide should be inserted into the vagina with an applicator at least 10-15 minutes before   sex to allow time for it to work. The process must be repeated every time you have sex. Spermicides do not require a prescription. Intrauterine contraception Intrauterine device (IUD) An IUD is a T-shaped device that is put in a woman's uterus. There are two types:  Hormone IUD.This type contains progestin, a synthetic form of the hormone progesterone. This  type can stay in place for 3-5 years.  Copper IUD.This type is wrapped in copper wire. It can stay in place for 10 years.  Permanent methods of contraception Female tubal ligation In this method, a woman's fallopian tubes are sealed, tied, or blocked during surgery to prevent eggs from traveling to the uterus. Hysteroscopic sterilization In this method, a small, flexible insert is placed into each fallopian tube. The inserts cause scar tissue to form in the fallopian tubes and block them, so sperm cannot reach an egg. The procedure takes about 3 months to be effective. Another form of birth control must be used during those 3 months. Female sterilization This is a procedure to tie off the tubes that carry sperm (vasectomy). After the procedure, the man can still ejaculate fluid (semen). Natural planning methods Natural family planning In this method, a couple does not have sex on days when the woman could become pregnant. Calendar method This means keeping track of the length of each menstrual cycle, identifying the days when pregnancy can happen, and not having sex on those days. Ovulation method In this method, a couple avoids sex during ovulation. Symptothermal method This method involves not having sex during ovulation. The woman typically checks for ovulation by watching changes in her temperature and in the consistency of cervical mucus. Post-ovulation method In this method, a couple waits to have sex until after ovulation. Summary  Contraception, also called birth control, means methods or devices that prevent pregnancy.  Hormonal methods of contraception include implants, injections, pills, patches, vaginal rings, and emergency contraceptives.  Barrier methods of contraception can include female condoms, female condoms, diaphragms, cervical caps, sponges, and spermicides.  There are two types of IUDs (intrauterine devices). An IUD can be put in a woman's uterus to prevent pregnancy  for 3-5 years.  Permanent sterilization can be done through a procedure for males, females, or both.  Natural family planning methods involve not having sex on days when the woman could become pregnant. This information is not intended to replace advice given to you by your health care provider. Make sure you discuss any questions you have with your health care provider. Document Released: 01/06/2005 Document Revised: 02/09/2016 Document Reviewed: 02/09/2016 Elsevier Interactive Patient Education  2018 [employer]vier [employer]  

## 2017-03-27 NOTE — Progress Notes (Signed)
History was provided by the patient.  Kelli Mcpherson is a 22 y.o. female who is here for STI screening.     HPI:   22 year old female with PMH of Chlamydia and Trichomonas presents for STI screening. She reports last unprotected sexual intercourse was two days ago. Concerned that every time she has sex without a condom she gets an infection. She has had two partners in the recent past. [facility] but she says she is no longer having intercourse with him. The other partner that she has been having intercourse with has given her Chlamydia in the past and to her knowledge has never been treated. She denies vaginal discharge, vaginal itching/irritation, pelvic pain, nausea/vomiting, fevers, and dysuria.   Reports that she is not interested in becoming pregnant right now. She had the Nexplanon removed in Oct 2018 due to weight gain. Frustrated that it was difficult to get the appointment to have it removed. Resistant to discussing other options for contraception.   The following portions of the patient's history were reviewed and updated as appropriate: allergies, current medications, past family history, past medical history, past social history, past surgical history and problem list.  Physical Exam:  BP 120/90 (BP Location: Right Arm, Patient Position: Sitting, Cuff Size: Large)   Temp (!) 97.2 F (36.2 C) (Temporal)   Wt 279 lb 3.2 oz (126.6 kg)   BMI 37.61 kg/m    General:   alert, cooperative and no distress  Skin:   normal  Oral cavity:   lips, mucosa, and tongue normal; teeth and gums normal  Eyes:   sclerae white  Nose: clear, no discharge  Lungs:  clear to auscultation bilaterally  Heart:   regular rate and rhythm, S1, S2 normal, no murmur, click, rub or gallop   Abdomen:  soft, non-tender; bowel sounds normal; no masses,  no organomegaly  Extremities:   extremities normal, atraumatic, no cyanosis or edema  Neuro:  normal without focal findings  and mental status, speech normal, alert and oriented x3    Assessment/Plan:  1. Routine screening for STI (sexually transmitted infection) Patient with history of STDs in the past presents for screening today. Counseled extensively on safe sex and need to have partners tested/treated if she is positive today. Condoms given at today's visit.  - C. trachomatis/N. gonorrhoeae RNA - HIV antibody - RPR - WET PREP FOR TRICH, YEAST, CLUE  2. Pregnancy examination or test, negative result - POCT urine pregnancy  3. Encounter for counseling regarding contraception Discussed contraception options extensively with patient. She is opposed to an implantable device but discussed that there are options that she has more control over such as OCPs, patches, vaginal rings, etc. Patient would like to discuss further with her mother. Handouts provided regarding various options and their efficacy.   De Hollingsheadatherine L Marlenne Ridge, DO  03/27/17

## 2017-03-28 LAB — C. TRACHOMATIS/N. GONORRHOEAE RNA
C. trachomatis RNA, TMA: NOT DETECTED
N. GONORRHOEAE RNA, TMA: NOT DETECTED

## 2017-03-30 ENCOUNTER — Telehealth: Payer: Self-pay

## 2017-03-30 LAB — RPR: RPR Ser Ql: NONREACTIVE

## 2017-03-30 LAB — HIV ANTIBODY (ROUTINE TESTING W REFLEX): HIV: NONREACTIVE

## 2017-03-30 NOTE — Telephone Encounter (Signed)
Patient left message on nurse line asking for lab results from visit last week. Please call her at [phone].

## 2017-04-01 ENCOUNTER — Other Ambulatory Visit: Payer: Self-pay | Admitting: Pediatrics

## 2017-04-01 DIAGNOSIS — N760 Acute vaginitis: Principal | ICD-10-CM

## 2017-04-01 DIAGNOSIS — A599 Trichomoniasis, unspecified: Secondary | ICD-10-CM

## 2017-04-01 DIAGNOSIS — B9689 Other specified bacterial agents as the cause of diseases classified elsewhere: Secondary | ICD-10-CM

## 2017-04-01 LAB — WET PREP BY MOLECULAR PROBE
Candida species: NOT DETECTED
MICRO NUMBER: 90300479
SPECIMEN QUALITY:: ADEQUATE
Trichomonas vaginosis: NOT DETECTED

## 2017-04-01 LAB — WET PREP FOR TRICH, YEAST, CLUE

## 2017-04-01 MED ORDER — METRONIDAZOLE 500 MG PO TABS: 500.0000 mg | ORAL_TABLET | Freq: Two times a day (BID) | ORAL | 0 refills | Status: AC

## 2017-04-01 NOTE — Progress Notes (Signed)
Had positive BV at visit with Peds Teaching last week.

## 2017-04-01 NOTE — Telephone Encounter (Signed)
Patient is looking for results. Called to inform her of results and POC but no VM. Per Dr. Lubertha SouthProse, positive for BV. Other tests normal. Medication sent to pharmacy. Will try her later.

## 2017-04-01 NOTE — Telephone Encounter (Signed)
Second attempt to contact patient. No answer or VM.

## 2017-04-01 NOTE — Telephone Encounter (Signed)
Patient notified of results and POC. Advised against alcohol during treatment and to abstain from sex while taking medication.

## 2017-05-19 ENCOUNTER — Encounter: Payer: Self-pay | Admitting: Pediatrics

## 2017-05-19 ENCOUNTER — Ambulatory Visit (INDEPENDENT_AMBULATORY_CARE_PROVIDER_SITE_OTHER): Payer: Medicaid Other | Admitting: Pediatrics

## 2017-05-19 VITALS — BP 140/88 | HR 97 | Ht 71.85 in | Wt 282.4 lb

## 2017-05-19 DIAGNOSIS — Z789 Other specified health status: Secondary | ICD-10-CM

## 2017-05-19 DIAGNOSIS — Z30012 Encounter for prescription of emergency contraception: Secondary | ICD-10-CM

## 2017-05-19 DIAGNOSIS — N898 Other specified noninflammatory disorders of vagina: Secondary | ICD-10-CM | POA: Diagnosis not present

## 2017-05-19 MED ORDER — ULIPRISTAL ACETATE 30 MG PO TABS
30.0000 mg | ORAL_TABLET | Freq: Once | ORAL | Status: AC
  Administered 2017-05-19: 30 mg via ORAL

## 2017-05-19 NOTE — Progress Notes (Signed)
History was provided by the patient.  Kelli Mcpherson is a 22 y.o. female who is here for concern condom is stuck internally .   PCP confirmed? Yes.    Tilman Neat, MD  HPI:  Having a discharge and odor.  Last sexually active Friday. No pain with sex. Worried that she retained a condom. She had the tip of it that came out.  Agreeable to [facility] today.  Not interested in other form of contraception today.    Review of Systems  Constitutional: Negative for malaise/fatigue.  Eyes: Negative for double vision.  Respiratory: Negative for shortness of breath.   Cardiovascular: Negative for chest pain and palpitations.  Gastrointestinal: Negative for abdominal pain, constipation, diarrhea, nausea and vomiting.  Genitourinary: Negative for dysuria.  Musculoskeletal: Negative for joint pain and myalgias.  Skin: Negative for rash.  Neurological: Negative for dizziness and headaches.  Endo/Heme/Allergies: Does not bruise/bleed easily.     Patient Active Problem List   Diagnosis Date Noted  . Obesity 08/01/2015    Current Outpatient Medications on File Prior to Visit  Medication Sig Dispense Refill  . ibuprofen (MOTRIN IB) 200 MG tablet Take 4 tablets (800 mg total) by mouth every 8 (eight) hours as needed for mild pain. (Patient not taking: Reported on 03/27/2017) 30 tablet 0  . triamcinolone ointment (KENALOG) 0.1 % Apply 1 application topically 2 (two) times daily. (Patient not taking: Reported on 03/27/2017) 80 g 4   Current Facility-Administered Medications on File Prior to Visit  Medication Dose Route Frequency Provider Last Rate Last Dose  . azithromycin (ZITHROMAX) tablet 1,000 mg  1,000 mg Oral Daily Prose, Hazel Dell Bing, MD        No Known Allergies  Physical Exam:    Vitals:   05/19/17 1404  BP: 140/88  Pulse: 97  Weight: 282 lb 6.4 oz (128.1 kg)  Height: 5' 11.85" (1.825 m)    Growth percentile SmartLinks can only be used for patients less than 51 years old. No LMP  recorded.  Physical Exam  Constitutional: She appears well-developed. No distress.  HENT:  Mouth/Throat: Oropharynx is clear and moist.  Neck: No thyromegaly present.  Cardiovascular: Normal rate and regular rhythm.  No murmur heard. Pulmonary/Chest: Breath sounds normal.  Abdominal: Soft. She exhibits no mass. There is no tenderness. There is no guarding.  Genitourinary: There is no rash or lesion on the right labia. There is no rash or lesion on the left labia. Cervix exhibits discharge. Cervix exhibits no friability. No bleeding in the vagina. No foreign body in the vagina. Vaginal discharge found.  Musculoskeletal: She exhibits no edema.  Lymphadenopathy:    She has no cervical adenopathy.  Neurological: She is alert.  Skin: Skin is warm. No rash noted.  Psychiatric: She has a normal mood and affect.  Nursing note and vitals reviewed.    Assessment/Plan: 1. Vaginal discharge Swabs today given that she was positive for chlamydia and trich at last visit. She did not have a retained foreign body.  - WET PREP BY MOLECULAR PROBE - C. trachomatis/N. gonorrhoeae RNA  2. Emergency contraception Given that condom broke, offered EC. She was agreeable. Does not desire regular contraception today.  - ulipristal acetate (ELLA) tablet 30 mg

## 2017-05-19 NOTE — Patient Instructions (Addendum)
No retained condom today. We swabbed for infections and we will send you results.   MyChart username: [id number]

## 2017-05-20 LAB — WET PREP BY MOLECULAR PROBE
Candida species: NOT DETECTED
MICRO NUMBER: 90525215
SPECIMEN QUALITY: ADEQUATE
Trichomonas vaginosis: NOT DETECTED

## 2017-05-20 LAB — C. TRACHOMATIS/N. GONORRHOEAE RNA
C. TRACHOMATIS RNA, TMA: DETECTED — AB
N. gonorrhoeae RNA, TMA: NOT DETECTED

## 2017-05-21 ENCOUNTER — Encounter: Payer: Self-pay | Admitting: Pediatrics

## 2017-05-21 ENCOUNTER — Ambulatory Visit (INDEPENDENT_AMBULATORY_CARE_PROVIDER_SITE_OTHER): Payer: Medicaid Other

## 2017-05-21 VITALS — BP 136/79 | HR 86 | Ht 71.85 in | Wt 282.8 lb

## 2017-05-21 DIAGNOSIS — A749 Chlamydial infection, unspecified: Secondary | ICD-10-CM

## 2017-05-21 MED ORDER — AZITHROMYCIN 500 MG PO TABS
1000.0000 mg | ORAL_TABLET | Freq: Once | ORAL | Status: AC
  Administered 2017-05-21: 1000 mg via ORAL

## 2017-05-21 NOTE — Progress Notes (Signed)
Pt here today to see RN for STI treatment. Allergies reviewed and medication tolerated well. Patient education given and gave partner treatment per patient request. Made patient aware to abstain from sex for 7 days and partner as well. Pt has no concerns or issues- follow up PRN.

## 2017-05-21 NOTE — Patient Instructions (Signed)
Chlamydia, Female Chlamydia is an STD (sexually transmitted disease). This is an infection that spreads through sexual contact. If it is not treated, it can cause serious problems. It must be treated with antibiotic medicine.  Sometimes, you may not have symptoms (asymptomatic). When you have symptoms, they can include:  Burning when you pee (urinate).  Peeing often.  Fluid (discharge) coming from the vagina.  Redness, soreness, and swelling (inflammation) of the butt (rectum).  Bleeding or fluid coming from the butt.  Belly (abdominal) pain.  Pain during sex.  Bleeding between periods.  Itching, burning, or redness in the eyes.  Fluid coming from the eyes.  Follow these instructions at home: Medicines  Take over-the-counter and prescription medicines only as told by your doctor.  Take your antibiotic medicine as told by your doctor. Do not stop taking the antibiotic even if you start to feel better. Sexual activity  Tell sex partners about your infection. Sex partners are people you had oral, anal, or vaginal sex with within 60 days of when you started getting sick. They need treatment, too.  Do not have sex until: ? You and your sex partners have been treated. ? Your doctor says it is okay.  If you have a single dose treatment, wait 7 days before having sex. General instructions  It is up to you to get your test results. Ask your doctor when your results will be ready.  Get a lot of rest.  Eat healthy foods.  Drink enough fluid to keep your pee (urine) clear or pale yellow.  Keep all follow-up visits as told by your doctor. You may need tests after 3 months. Preventing chlamydia  The only way to prevent chlamydia is not to have sex. To lower your risk: ? Use latex condoms correctly. Do this every time you have sex. ? Avoid having many sex partners. ? Ask if your partner has been tested for STDs and if he or she had negative results. Contact a doctor  if:  You get new symptoms.  You do not get better with treatment.  You have a fever or chills.  You have pain during sex. Get help right away if:  Your pain gets worse and does not get better with medicine.  You get flu-like symptoms, such as: ? Night sweats. ? Sore throat. ? Muscle aches.  You feel sick to your stomach (nauseous).  You throw up (vomit).  You have trouble swallowing.  You have bleeding: ? Between periods. ? After sex.  You have irregular periods.  You have belly pain that does not get better with medicine.  You have lower back pain that does not get better with medicine.  You feel weak or dizzy.  You pass out (faint).  You are pregnant and you get symptoms of chlamydia. Summary  Chlamydia is an infection that spreads through sexual contact.  Sometimes, chlamydia can cause no symptoms (asymptomatic).  Do not have sex until your doctor says it is okay.  All sex partners will have to be treated for chlamydia. This information is not intended to replace advice given to you by your health care provider. Make sure you discuss any questions you have with your health care provider.

## 2017-05-22 ENCOUNTER — Telehealth: Payer: Self-pay

## 2017-05-22 NOTE — Telephone Encounter (Signed)
Pt called stating Flagyl was not sent ot pharmacy for BV. Sending to Christianne Dolin, NP to prescribe.

## 2017-05-24 ENCOUNTER — Other Ambulatory Visit: Payer: Self-pay | Admitting: Pediatrics

## 2017-05-24 DIAGNOSIS — B9689 Other specified bacterial agents as the cause of diseases classified elsewhere: Secondary | ICD-10-CM

## 2017-05-24 DIAGNOSIS — N760 Acute vaginitis: Principal | ICD-10-CM

## 2017-05-24 MED ORDER — METRONIDAZOLE 500 MG PO TABS: 500.0000 mg | ORAL_TABLET | Freq: Two times a day (BID) | ORAL | 0 refills | Status: AC

## 2017-06-23 ENCOUNTER — Encounter: Payer: Self-pay | Admitting: Family

## 2017-06-23 ENCOUNTER — Telehealth: Payer: Self-pay | Admitting: Pediatrics

## 2017-06-23 ENCOUNTER — Ambulatory Visit (INDEPENDENT_AMBULATORY_CARE_PROVIDER_SITE_OTHER): Payer: Medicaid Other | Admitting: Family

## 2017-06-23 VITALS — BP 131/80 | HR 69 | Ht 71.0 in | Wt 279.8 lb

## 2017-06-23 DIAGNOSIS — N898 Other specified noninflammatory disorders of vagina: Secondary | ICD-10-CM

## 2017-06-23 DIAGNOSIS — Z3202 Encounter for pregnancy test, result negative: Secondary | ICD-10-CM

## 2017-06-23 DIAGNOSIS — L739 Follicular disorder, unspecified: Secondary | ICD-10-CM

## 2017-06-23 DIAGNOSIS — Z113 Encounter for screening for infections with a predominantly sexual mode of transmission: Secondary | ICD-10-CM | POA: Diagnosis not present

## 2017-06-23 LAB — POCT URINE PREGNANCY: Preg Test, Ur: NEGATIVE

## 2017-06-23 NOTE — Telephone Encounter (Signed)
Kelli Mcpherson is requesting a referral to dermatology for her eczema. Please advice. Thanks.

## 2017-06-23 NOTE — Telephone Encounter (Signed)
[name] should have a clinic appointment.  We can order prescription medicine and see if there is improvement before making a dermatology referral.

## 2017-06-23 NOTE — Progress Notes (Signed)
History was provided by the patient.  Kelli Mcpherson is a 22 y.o. female who is here for vaginal discharge, vaginal lesion.   PCP confirmed? Yes.    [name], [name], MD  HPI:   -was seen on 04/30 for vaginal discharge and possible retained condom.  -EC was given, tx for BV was given. She was treated for chlamydia on 05/21/17. -still having some discharge, not odorous. Most concerned about three vaginal lesions.  -she waxes pubic hair, about 5 weeks ago. No pain with lesions, no drainage.  -wants to be rescreened for STIs -no cramping, no pain with intercourse  Review of Systems  Constitutional: Negative for malaise/fatigue.  Eyes: Negative for double vision.  Respiratory: Negative for shortness of breath.   Cardiovascular: Negative for chest pain and palpitations.  Gastrointestinal: Negative for abdominal pain, constipation, diarrhea, nausea and vomiting.  Genitourinary: Negative for dysuria.  Musculoskeletal: Negative for joint pain and myalgias.  Skin: Negative for rash.  Neurological: Negative for dizziness and headaches.  Endo/Heme/Allergies: Does not bruise/bleed easily.     Patient Active Problem List   Diagnosis Date Noted  . Obesity 08/01/2015    No current outpatient medications on file prior to visit.   No current facility-administered medications on file prior to visit.     No Known Allergies  Physical Exam:    Vitals:   06/23/17 0918  BP: 131/80  Pulse: 69  Weight: 279 lb 12.8 oz (126.9 kg)  Height: 5\' 11"  (1.803 m)    Growth percentile SmartLinks can only be used for patients less than 21 years old. No LMP recorded.  Physical Exam  Constitutional: She appears well-developed. No distress.  HENT:  Head: Normocephalic and atraumatic.  Neck: Normal range of motion. Neck supple.  Cardiovascular: Normal rate and regular rhythm.  No murmur heard. Pulmonary/Chest: Effort normal and breath sounds normal.  Abdominal: Soft.  Genitourinary: Vagina  normal.  Musculoskeletal: She exhibits no edema.  Lymphadenopathy:    She has no cervical adenopathy.  Neurological: She is alert.  Skin: Skin is warm and dry. No rash noted.  3 follicular lesions no drainage noted    Assessment/Plan: 1. Folliculitis -reassurance given, does not appear to be HSV, not painful on exam  -warm moist compresses, sitz baths, waxing precautions   2. Vaginal discharge -will await results - WET PREP BY MOLECULAR PROBE  3. Routine screening for STI (sexually transmitted infection) -will await results - C. trachomatis/N. gonorrhoeae RNA - RPR - HIV antibody  4. Pregnancy examination or test, negative result -negative  - POCT urine pregnancy

## 2017-06-24 LAB — WET PREP BY MOLECULAR PROBE
Candida species: NOT DETECTED
MICRO NUMBER:: 90670819
SPECIMEN QUALITY: ADEQUATE
TRICHOMONAS VAG: NOT DETECTED

## 2017-06-24 LAB — RPR: RPR: NONREACTIVE

## 2017-06-24 LAB — HIV ANTIBODY (ROUTINE TESTING W REFLEX): HIV 1&2 Ab, 4th Generation: NONREACTIVE

## 2017-06-24 LAB — C. TRACHOMATIS/N. GONORRHOEAE RNA
C. TRACHOMATIS RNA, TMA: NOT DETECTED
N. gonorrhoeae RNA, TMA: NOT DETECTED

## 2017-06-25 ENCOUNTER — Other Ambulatory Visit: Payer: Self-pay | Admitting: Pediatrics

## 2017-06-25 MED ORDER — METRONIDAZOLE 500 MG PO TABS: 500.0000 mg | ORAL_TABLET | Freq: Two times a day (BID) | ORAL | 0 refills | Status: AC

## 2017-06-25 NOTE — Telephone Encounter (Signed)
Attempted to call patient but "unable to complete call at this time."  Will try again later.

## 2017-06-26 NOTE — Telephone Encounter (Signed)
I spoke with Kelli Mcpherson and scheduled appointment for 06/30/17.

## 2017-06-30 ENCOUNTER — Encounter: Payer: Self-pay | Admitting: Pediatrics

## 2017-06-30 ENCOUNTER — Ambulatory Visit (INDEPENDENT_AMBULATORY_CARE_PROVIDER_SITE_OTHER): Payer: Medicaid Other | Admitting: Pediatrics

## 2017-06-30 VITALS — Temp 97.6°F | Wt 281.2 lb

## 2017-06-30 DIAGNOSIS — L309 Dermatitis, unspecified: Secondary | ICD-10-CM

## 2017-06-30 DIAGNOSIS — L2084 Intrinsic (allergic) eczema: Secondary | ICD-10-CM

## 2017-06-30 MED ORDER — TRIAMCINOLONE ACETONIDE 0.1 % EX OINT: 1.0000 "application " | TOPICAL_OINTMENT | Freq: Two times a day (BID) | CUTANEOUS | 3 refills | Status: DC

## 2017-06-30 MED ORDER — NYSTATIN 100000 UNIT/GM EX OINT: 1.0000 "application " | TOPICAL_OINTMENT | Freq: Four times a day (QID) | CUTANEOUS | 1 refills | Status: DC

## 2017-06-30 NOTE — Progress Notes (Signed)
   Subjective:     Kelli Mcpherson, is a 22 y.o. female  HPI  Chief Complaint  Patient presents with  . Eczema   Recent visits include: Last seen for eczema 11/17/16: rx'd TAC 0.01% 80 gm R x 4 Also seen 6/4: for bacterial vaginosis with TOC neg for Chlamydia  Flagyl sent to pharmacy -- not picked up yet "they didn't hae it when she first went over there  Itchy on nipple for years on and off, uses her eczema cream on it, Especially itchy at night when she sleeps and scratches it   Eczema hand and arms:  Not  Soap: soap Moisturizer--not really using   Review of Systems   History and Problem List: [name] has Obesity on their problem list.  [name]  has a past medical history of Eczema.     Objective:     Vitals:   06/30/17 1354  Temp: 97.6 F (36.4 C)    Physical Exam  Skin: No acne on face or upper back, thick darkened skin under breasts Left nipple scale small scabbing mild erythema Hyperpigmented lichenified plaque hyperpigmented antecubital fossa left and wrists hyperpigmented Patch left shin no scale no thickening     Assessment & Plan:   1. Intrinsic atopic dermatitis  Please use moisturizer Under bra line more likely acanthosis nigricans Shin probably has postinflammatory hyperpigmentation  Nipple triamcinolone is not going to help nipple dermatitis   - triamcinolone ointment (KENALOG) 0.1 %; Apply 1 application topically 2 (two) times daily.  Dispense: 80 g; Refill: 3  2. Nipple dermatitis  Trial of treatment for candidal dermatitis Limited insurance using nystatin for $4 drug lis. t belo although w Chlortrimazole would be a better choice it is not covered in $4 drug list  - nystatin ointment (MYCOSTATIN); Apply 1 application topically 4 (four) times daily.  Dispense: 30 g; Refill: 1  Not sure about insurance coverage, has Medicaid for family planning  Supportive care and return precautions reviewed.  Spent  15  minutes face to  face time with patient; greater than 50% spent in counseling regarding diagnosis and treatment plan.   Theadore NanHilary Ninel Abdella, MD

## 2017-06-30 NOTE — Patient Instructions (Signed)
Good to see you today! Thank you for coming in.   Nystatin is for the Nipple rash  Triamcinolone is for the Eczema  Both medicines are available at [facility] for 4$

## 2017-08-10 ENCOUNTER — Ambulatory Visit (INDEPENDENT_AMBULATORY_CARE_PROVIDER_SITE_OTHER): Payer: Medicaid Other | Admitting: Pediatrics

## 2017-08-10 ENCOUNTER — Encounter: Payer: Self-pay | Admitting: Pediatrics

## 2017-08-10 VITALS — BP 118/69 | HR 63 | Ht 71.0 in | Wt 281.4 lb

## 2017-08-10 DIAGNOSIS — Z3009 Encounter for other general counseling and advice on contraception: Secondary | ICD-10-CM

## 2017-08-10 DIAGNOSIS — Z3202 Encounter for pregnancy test, result negative: Secondary | ICD-10-CM | POA: Diagnosis not present

## 2017-08-10 DIAGNOSIS — N898 Other specified noninflammatory disorders of vagina: Secondary | ICD-10-CM | POA: Diagnosis not present

## 2017-08-10 DIAGNOSIS — L739 Follicular disorder, unspecified: Secondary | ICD-10-CM | POA: Insufficient documentation

## 2017-08-10 DIAGNOSIS — Z113 Encounter for screening for infections with a predominantly sexual mode of transmission: Secondary | ICD-10-CM

## 2017-08-10 LAB — POCT URINE PREGNANCY: Preg Test, Ur: NEGATIVE

## 2017-08-10 MED ORDER — CLINDAMYCIN PHOSPHATE 1 % EX GEL
Freq: Two times a day (BID) | CUTANEOUS | 0 refills | Status: DC
Start: 2017-08-10 — End: 2017-10-13

## 2017-08-10 NOTE — Patient Instructions (Signed)
Clindamycin gel to the bumps that you have  Track your periods with the Glow app  If you are having discharge that is like egg whites, try and avoid sex  Use plan b up to 72 hours after sex if condom breaks or no condom  We will call you with results  Sugar scrub or loofa for exfoliation after waxing

## 2017-08-10 NOTE — Progress Notes (Signed)
History was provided by the patient.  Kelli Mcpherson is a 22 y.o. female who is here for repro health concerns.   PCP confirmed? Yes.    Tilman Neat, MD  HPI:  Was having white discharge. She was trying to eat shirmp and steak from [employer] and got concerned she was pregnant. LMP in June- she thinks in the middle. She does not normally have regular periods. condom broke when she was last sexually active    Had nexplanon but felt she was really irritable with it and wanted it out but didn't feel that anyone would take it out for her (was seen in primary care). Does not want another form of contraception at this point, even something like pills that is in her control. Does not want to be pregnant. Is willing to take plan B as needed.   Also concerned that she has another bump down there-- concerned about folliculitis as before vs. HSV. One partner who kissed her has been with another woman who has herpes.    Review of Systems  Constitutional: Negative for malaise/fatigue.  Eyes: Negative for double vision.  Respiratory: Negative for shortness of breath.   Cardiovascular: Negative for chest pain and palpitations.  Gastrointestinal: Positive for abdominal pain and diarrhea. Negative for constipation, nausea and vomiting.  Genitourinary: Negative for dysuria.  Musculoskeletal: Negative for joint pain and myalgias.  Skin: Negative for rash.  Neurological: Negative for dizziness and headaches.  Endo/Heme/Allergies: Does not bruise/bleed easily.     Patient Active Problem List   Diagnosis Date Noted  . Obesity 08/01/2015    Current Outpatient Medications on File Prior to Visit  Medication Sig Dispense Refill  . nystatin ointment (MYCOSTATIN) Apply 1 application topically 4 (four) times daily. (Patient not taking: Reported on 08/10/2017) 30 g 1  . triamcinolone ointment (KENALOG) 0.1 % Apply 1 application topically 2 (two) times daily. (Patient not taking: Reported on 08/10/2017)  80 g 3   No current facility-administered medications on file prior to visit.     No Known Allergies  Physical Exam:    Vitals:   08/10/17 1035  BP: 118/69  Pulse: 63  Weight: 281 lb 6.4 oz (127.6 kg)  Height: 5\' 11"  (1.803 m)    Growth percentile SmartLinks can only be used for patients less than 66 years old. No LMP recorded.  Physical Exam  Constitutional: She appears well-developed. No distress.  HENT:  Mouth/Throat: Oropharynx is clear and moist.  Neck: No thyromegaly present.  Cardiovascular: Normal rate and regular rhythm.  No murmur heard. Pulmonary/Chest: Breath sounds normal.  Abdominal: Soft. She exhibits no mass. There is no tenderness. There is no guarding.  Genitourinary: There is lesion on the left labia.  Genitourinary Comments: Scattered ingrown hairs to mons pubis and left labia majora consistent with folliculitis post waxing  Musculoskeletal: She exhibits no edema.  Lymphadenopathy:    She has no cervical adenopathy.  Neurological: She is alert.  Skin: Skin is warm. No rash noted.  Psychiatric: She has a normal mood and affect.  Nursing note and vitals reviewed.    Assessment/Plan: 1. Encounter for counseling regarding contraception Discussed contraception at length given that she does not want to be pregnant and was very concerned that she could be today. Discussed that it takes at least 14 days after sex for upreg to be positive so we can't know for sure today, but will see her back as needed. Discussed using plan B post sex without condom or when  condom breaks which she was agreeable. Discussed ideally wouldn't use more than about 4x/year. Provided extensive education about normal vaginal discharge, menstrual cycles and ovulation. Recommended tracking periods with an app.   2. Folliculitis Will try clindagel for sites that are ingrown. Also discussed good exfoliation after waxing and recommended sugar scrub or loofah to mons pubis.  - clindamycin  (CLINDAGEL) 1 % gel; Apply topically 2 (two) times daily.  Dispense: 30 g; Refill: 0  3. Vaginal discharge Wet prep today- did not appreciate much discharge on exam.  - WET PREP BY MOLECULAR PROBE  4. Routine screening for STI (sexually transmitted infection) Per protocol.  - C. trachomatis/N. gonorrhoeae RNA  5. Pregnancy examination or test, negative result Neg- however- only 10 days post unprotected sex.  - POCT urine pregnancy

## 2017-08-11 LAB — WET PREP BY MOLECULAR PROBE
Candida species: NOT DETECTED
MICRO NUMBER:: 90864488
SPECIMEN QUALITY:: ADEQUATE
Trichomonas vaginosis: NOT DETECTED

## 2017-08-11 LAB — C. TRACHOMATIS/N. GONORRHOEAE RNA
C. trachomatis RNA, TMA: NOT DETECTED
N. gonorrhoeae RNA, TMA: NOT DETECTED

## 2017-08-12 ENCOUNTER — Other Ambulatory Visit: Payer: Self-pay | Admitting: Pediatrics

## 2017-08-12 MED ORDER — METRONIDAZOLE 500 MG PO TABS: 500.0000 mg | ORAL_TABLET | Freq: Two times a day (BID) | ORAL | 0 refills | Status: AC

## 2017-08-28 ENCOUNTER — Other Ambulatory Visit: Payer: Self-pay | Admitting: Pediatrics

## 2017-08-28 NOTE — Telephone Encounter (Signed)
Patient called and needs a refill on her clindamycin. [facility].

## 2017-08-28 NOTE — Telephone Encounter (Signed)
Routing to provider  

## 2017-08-31 NOTE — Telephone Encounter (Signed)
Called and left generic VM stating patient needs follow up medication refill and to ensure the script is not at the pharmacy considering patient was prescribed medication on 7/22.

## 2017-08-31 NOTE — Telephone Encounter (Signed)
Needs OV- clinda just filled on 7/22?

## 2017-09-28 ENCOUNTER — Ambulatory Visit (INDEPENDENT_AMBULATORY_CARE_PROVIDER_SITE_OTHER): Payer: Medicaid Other | Admitting: Pediatrics

## 2017-09-28 ENCOUNTER — Encounter: Payer: Self-pay | Admitting: Pediatrics

## 2017-09-28 VITALS — Temp 97.8°F | Wt 278.0 lb

## 2017-09-28 DIAGNOSIS — R079 Chest pain, unspecified: Secondary | ICD-10-CM

## 2017-09-28 DIAGNOSIS — R1030 Lower abdominal pain, unspecified: Secondary | ICD-10-CM

## 2017-09-28 DIAGNOSIS — Z3202 Encounter for pregnancy test, result negative: Secondary | ICD-10-CM

## 2017-09-28 LAB — POCT URINE PREGNANCY: PREG TEST UR: NEGATIVE

## 2017-09-28 NOTE — Patient Instructions (Addendum)
Your abdominal pain could be due to period but may be due to gastroenteritis, or a stomach virus. If you are continuing to have pain and diarrhea by Friday, we will need you to come back. In the meantime, we recommend you take ibuprofen  If you ever have severe, intense belly pain that causes you to double over in pain, we recommend that you go to the emergency department to make sure this is not your ovaries.  For the chest pain, we are glad you do not have any sign of chest pain due to cardiac or lung problems. Please keep a record of when the chest pain is occurring and return if it does not improve with the modifications in your work schedule. To the extent possible, you should avoid heavy lifting with your arms

## 2017-09-28 NOTE — Progress Notes (Signed)
Subjective:     Kelli Mcpherson, is a 23 y.o. female  HPI  Chief Complaint  Patient presents with  . Abdominal Pain    She said her stomach been cramping for three days   . Chest Pain    She said the chest pains happens on and off    Kelli Mcpherson is a 22 year old female with a history of obesity and previous STI who presents with stomach cramps for approximately 1 week.   Cramps started when she started her period 1 week ago. Her vaginal bleeding stopped 3 days ago, but the cramping has continued. She describes the pain as bilateral in the lower abdominal without radiation and is 8/10 in intensity. Pain is intermittent with episodes approximately 3 times a day. Episodes last 5 seconds at a time and are improved with ibuprofen. Patient took ibuprofen for the pain and found that it helped. However, she has run out of the medicine.   She's had no fever. She has also been nauseous, but has not thrown up. She has had diarrhea since the symptoms started that she describes "as pure water." She denies pain with stools, but does report that she has sometimes had blood on the toilet paper when it is not her toilet paper and reports having large stools.  She denies dysuria but is having vaginal discharge that is clear and smells strong but does not have a fishy odor. She is using Dove soap now to wash her vaginal area.  She has also had a cough for the last two weeks that is still present but much better than when symptoms first started.   She has no sick contacts. She has been eating and drinking as normal. She normally skips breakfast. Patient felt ill at work and had to leave early due to the pain.   She is also reporting sharp substernal chest pain. Chest pain is episodic and has been occurring randomly for the last 2 weeks. It is not brought on by exertion and is not related to coughing or breathing. She reports feeling dizzy, but that occurs at work and not with all episodes of chest pain.  She specifically reports dizziness when she goes from sitting to standing. She has never had syncope.  She also reports shortness of breath and mother gave her brother's inhaler, which the patient reports improves her symptoms but did not take them away.   Pertinent negatives include belching or regurgitation of stomach contents, palpitations. She has no family history of sudden youth death or cardiac problems in childhood.  Of note, the patient was seen by adolescent health in July 2019 with concerns that she might be pregnant. She has irregular periods at baseline, and was reporting that she did not want to replace her Nexplanon with another form of contraception. She has a history of previous STI.  Review of Systems All ten systems reviewed and otherwise negative except as stated in the HPI  The following portions of the patient's history were reviewed and updated as appropriate: allergies, current medications, past medical history, past social history and problem list.     Objective:     Temperature 97.8 F (36.6 C), temperature source Temporal, weight 278 lb (126.1 kg).  Physical Exam  General: well-nourished, in NAD HEENT: [place]/AT, PERRL, EOMI, no conjunctival injection, mucous membranes moist, oropharynx clear Neck: full ROM, supple Lymph nodes: no cervical lymphadenopathy Chest: lungs CTAB, no nasal flaring or grunting, no increased work of breathing, no retractions Heart:  RRR, no m/r/g Abdomen: soft, nontender, nondistended, no hepatosplenomegaly Extremities: Cap refill <3s Musculoskeletal: full ROM in 4 extremities, moves all extremities equally Neurological: alert and active Skin: no rash     Assessment & Plan:   Abdominal Pain - most likely differential is between continued period pain, given her history of irregular bleeding and the very brief episodic nature, and gastroenteritis, given presence of diarrhea - Urine pregnancy today - Reviewed conservative care  instructions for possible gastroenteritis - May continue to take ibuprofen every 6 hours as needed for pain - Requested that patient schedule repeat visit with [place] or adolescent pod about her vaginal discharge - Patient to return if still having abdominal pain and diarrhea in 4 days (9/13) - Reviewed emergency sources of abdominal pain, including appendicitis and ovarian torsion; recommended going to the emergency department if she has severe lower abdominal pain that causes her to double over  Chest Pain - pain sounds musculoskeletal ; no red flags for cardiac chest pain - Instructed patient to return during episode of shortness of breath so that we can better assess those symptoms - May try Tylenol and ibuprofen PRN pain - Provided note for patient's workplace, as some episodes of pain seem to be occurring with dizziness in the context of poor hydration and irregular eating patterns that are somewhat new for the patient, so that she gets 2 shorter breaks instead of one longer break - Encouraged her to eat 3 meals a day, including breakfast and recommended at least 64 oz of water per day  Supportive care and return precautions reviewed.   Dorene Sorrow, MD

## 2017-10-05 ENCOUNTER — Ambulatory Visit (INDEPENDENT_AMBULATORY_CARE_PROVIDER_SITE_OTHER): Payer: Medicaid Other | Admitting: Family

## 2017-10-05 ENCOUNTER — Encounter: Payer: Self-pay | Admitting: Family

## 2017-10-05 VITALS — BP 118/74 | HR 50 | Ht 70.67 in | Wt 289.6 lb

## 2017-10-05 DIAGNOSIS — N898 Other specified noninflammatory disorders of vagina: Secondary | ICD-10-CM | POA: Diagnosis not present

## 2017-10-05 DIAGNOSIS — Z113 Encounter for screening for infections with a predominantly sexual mode of transmission: Secondary | ICD-10-CM

## 2017-10-05 DIAGNOSIS — Z3202 Encounter for pregnancy test, result negative: Secondary | ICD-10-CM | POA: Diagnosis not present

## 2017-10-05 LAB — POCT URINE PREGNANCY: Preg Test, Ur: NEGATIVE

## 2017-10-05 NOTE — Progress Notes (Signed)
History was provided by the patient.  Kelli Mcpherson is a 22 y.o. female who is here for concern for STI testing.   PCP confirmed? Yes.    [name], [name], MD  HPI:   2 weeks vaginal discharge. Smell, heavy.  Uses condoms. Condom broke. Cramping better but no pain with sex No unexplained vag bleeding Ok to leave VM with results  Is not interested in another form of BC at this time.   Review of Systems  Constitutional: Negative for malaise/fatigue.  Eyes: Negative for double vision.  Respiratory: Negative for shortness of breath.   Cardiovascular: Negative for chest pain and palpitations.  Gastrointestinal: Negative for abdominal pain, constipation, diarrhea, nausea and vomiting.  Genitourinary: Negative for dysuria.  Musculoskeletal: Negative for joint pain and myalgias.  Skin: Negative for rash.  Neurological: Negative for dizziness and headaches.  Endo/Heme/Allergies: Does not bruise/bleed easily.      Patient Active Problem List   Diagnosis Date Noted  . Folliculitis 08/10/2017  . Obesity 08/01/2015    Current Outpatient Medications on File Prior to Visit  Medication Sig Dispense Refill  . clindamycin (CLINDAGEL) 1 % gel Apply topically 2 (two) times daily. (Patient not taking: Reported on 09/28/2017) 30 g 0   No current facility-administered medications on file prior to visit.     No Known Allergies  Physical Exam:    Vitals:   10/05/17 1145  BP: 118/74  Pulse: (!) 50  Weight: 289 lb 9.6 oz (131.4 kg)  Height: 5' 10.67" (1.795 m)   Wt Readings from Last 3 Encounters:  10/05/17 289 lb 9.6 oz (131.4 kg)  09/28/17 278 lb (126.1 kg)  08/10/17 281 lb 6.4 oz (127.6 kg)    Growth percentile SmartLinks can only be used for patients less than 21 years old. No LMP recorded.  Physical Exam  Constitutional: She appears well-developed and well-nourished. No distress.  Eyes: Pupils are equal, round, and reactive to light. EOM are normal. No scleral icterus.   Cardiovascular: Normal rate.  No murmur heard. Pulmonary/Chest: Effort normal.  Abdominal: Soft. There is no tenderness. There is no guarding.  Genitourinary: Vaginal discharge (self swab) found.  Musculoskeletal: Normal range of motion.  Lymphadenopathy:    She has no cervical adenopathy.  Neurological: She is alert.  Skin: Skin is warm and dry. No rash noted.  Psychiatric: She has a normal mood and affect.  Nursing note and vitals reviewed.   Assessment/Plan: 1. Vaginal discharge -history consistent with BV, will await results.  - WET PREP BY MOLECULAR PROBE  2. Routine screening for STI (sexually transmitted infection) -will screen for all infections  - RPR - WET PREP BY MOLECULAR PROBE - HIV Antibody (routine testing w rflx)  3. Negative pregnancy test -negative, she is not precontemplative for change for birth control at this time -condom use reviewed, EC reviewed - POCT urine pregnancy

## 2017-10-06 ENCOUNTER — Other Ambulatory Visit: Payer: Self-pay | Admitting: Family

## 2017-10-06 LAB — WET PREP BY MOLECULAR PROBE
Candida species: NOT DETECTED
MICRO NUMBER: 91107723
SPECIMEN QUALITY: ADEQUATE
Trichomonas vaginosis: NOT DETECTED

## 2017-10-06 LAB — RPR: RPR Ser Ql: NONREACTIVE

## 2017-10-06 LAB — HIV ANTIBODY (ROUTINE TESTING W REFLEX): HIV 1&2 Ab, 4th Generation: NONREACTIVE

## 2017-10-06 MED ORDER — METRONIDAZOLE 500 MG PO TABS: 500.0000 mg | ORAL_TABLET | Freq: Two times a day (BID) | ORAL | 0 refills | Status: DC

## 2017-10-07 ENCOUNTER — Telehealth: Payer: Self-pay | Admitting: Family

## 2017-10-07 NOTE — Telephone Encounter (Signed)
Patient called to get results from Mondays visit. Please give her a call back with her results at your earliest convenience. If she does not answer the phone it is ok to lvm.

## 2017-10-07 NOTE — Telephone Encounter (Addendum)
Called and left VM per patient.

## 2017-10-10 ENCOUNTER — Encounter: Payer: Self-pay | Admitting: Family

## 2017-10-13 ENCOUNTER — Encounter: Payer: Self-pay | Admitting: Pediatrics

## 2017-10-13 ENCOUNTER — Ambulatory Visit (INDEPENDENT_AMBULATORY_CARE_PROVIDER_SITE_OTHER): Payer: Medicaid Other | Admitting: Pediatrics

## 2017-10-13 VITALS — BP 110/72 | HR 67 | Temp 97.4°F | Wt 283.8 lb

## 2017-10-13 DIAGNOSIS — R1030 Lower abdominal pain, unspecified: Secondary | ICD-10-CM

## 2017-10-13 DIAGNOSIS — N898 Other specified noninflammatory disorders of vagina: Secondary | ICD-10-CM

## 2017-10-13 DIAGNOSIS — R0789 Other chest pain: Secondary | ICD-10-CM

## 2017-10-13 DIAGNOSIS — Z3202 Encounter for pregnancy test, result negative: Secondary | ICD-10-CM | POA: Diagnosis not present

## 2017-10-13 DIAGNOSIS — G44219 Episodic tension-type headache, not intractable: Secondary | ICD-10-CM

## 2017-10-13 DIAGNOSIS — R071 Chest pain on breathing: Secondary | ICD-10-CM

## 2017-10-13 LAB — POCT URINE PREGNANCY: PREG TEST UR: NEGATIVE

## 2017-10-13 NOTE — Patient Instructions (Addendum)
[facility]  Website  Directions  Save  [phone] Google reviews  [place]  Address: [address], [place], [place] [address]  Hours:  Open ? Closes 12:30PM ? Reopens 1:30PM   Phone: [phone]    For the chest pain can take 600 mg (3 tabs) of ibuprofen every 8 hours for the next week.

## 2017-10-13 NOTE — Progress Notes (Addendum)
Subjective:    Kelli Mcpherson, is a 22 y.o. female   Chief Complaint  Patient presents with  . Chest Pain    today when she was at work she felt like she was going to pass out, she said it feels likes someone is squeezing her chest  . Abdominal Pain    started at visit on 09/28/2017   History provider by patient Interpreter: no  HPI:  CMA's notes and vital signs have been reviewed  New Concern #1 Onset of symptoms:   Abdominal pain persistent since 09/28/17 visit with Dr. Hartley BarefootSteptoe.  Per that office note; patient was seen by adolescent health in July 2019 with concerns that she might be pregnant. She has irregular periods at baseline, and was reporting that she did not want to replace her Nexplanon with another form of contraception. She has a history of previous STI.  Saw Adolescent Medicine 10/05/17 for Vaginal discharge/abdominal pain  Results for Kelli BrooklynAYLOR, Johnnisha N (MRN [id number]) as of 10/13/2017 10:18  Ref. Range 09/28/2017 12:07 10/05/2017 12:03 10/05/2017 12:07 10/05/2017 13:19 10/05/2017 13:28  Trichomonas vaginosis Unknown     Not Detected  Candida species Unknown     Not Detected  Gardnerella vaginalis Unknown     Detected. Increased levels of G. vaginalis may not be significant in the absence of signs and sym... (A)  HIV Latest Ref Range: NON-REACTI   NON-REACTIVE     Preg Test, Ur Latest Ref Range: Negative  Negative   Negative       Component 8d ago  MICRO NUMBER: [id number]   SPECIMEN QUALITY: ADEQUATE   SOURCE: NOT GIVEN   STATUS: FINAL   Trichomonas vaginosis Not Detected   Gardnerella vaginalis Abnormal   Detected. Increased levels of G. vaginalis may not be significant in the absence of signs and symptoms of bacterial vaginosis.        On 10/06/17:  Orders for Flagyl 500 mg BID x 7 days prescribed.  Interval history: Patient states " I am still taking it (flagyl) . She cannot remember when she picked up the medication and started it. Abdominal pain - lower  mid abdominal below umbilicus - Feels like a menstrual cramp, which comes and goes, and it had improved until it  returned today while she was at work.  She experiences cramping at both work or at rest at home.   LMP:  Ended ~ 09/28/17 She has not been sexually active since 09/28/17 office visit Still having vaginal discharge - clear, "odor, ? Fishy), Using soap to clean herself  Appetite   Skips breakfast, Eats lunch 11:30 am and evening meal.  Poor fluid intake Sleep less than 8 hours nightly. Stool:  No diarrhea, soft stool daily. Voiding  Normal and no dysuria Sick Contacts:  No Travel: No  No fever.   Concern #2 Chest tightness - squeezing sensation over lower half of sternum  Per review of office note 09/28/17 reported the following history; sharp substernal chest pain. Chest pain is episodic and has been occurring randomly for the last 2 weeks. It is not brought on by exertion and is not related to coughing or breathing. She reports feeling dizzy, but that occurs at work and not with all episodes of chest pain. She specifically reports dizziness when she goes from sitting to standing. She has never had syncope.  Interval history:  Chest tightness over lower sternum  (10/10) happens at work it happened at 9:20 am today. Discomfort improved with rest but  did not go away.  Pain resolved after 2 - 3 minutes.  Pain radiated to her jaw.   Denies chest pain in the office.  She works Scientist, clinical (histocompatibility and immunogenetics).  Work type can vary throughout the week but is physical.  Concern #3  Complaining of headache started this morning at work - throbbing in right occiput.  No history of migraines No medication taken Denies stressors or major changes in life. Skipped breakfast (usually does, but now eating lunch "early at 11:30 am" Drinks little during the work day as they cannot have water bottles out on the work floor, so she drinks when she can from the water  fountain.  Medications:  Current Outpatient Medications on File Prior to Visit  Medication Sig Dispense Refill  . metroNIDAZOLE (FLAGYL) 500 MG tablet Take 1 tablet (500 mg total) by mouth 2 (two) times daily. 14 tablet 0   No current facility-administered medications on file prior to visit.     Review of Systems  Constitutional: Positive for activity change. Negative for appetite change and fever.  HENT: Negative for congestion, sinus pressure, sinus pain, sneezing and sore throat.   Eyes: Negative.   Respiratory: Positive for chest tightness.   Cardiovascular: Positive for chest pain.  Gastrointestinal: Positive for abdominal pain. Negative for constipation, diarrhea and nausea.  Genitourinary: Positive for vaginal discharge.  Musculoskeletal: Negative.   Neurological: Positive for dizziness and headaches.  Hematological: Negative.   Psychiatric/Behavioral: Negative.      Patient's history was reviewed and updated as appropriate: allergies, medications, and problem list.       has Obesity and Folliculitis on their problem list. Objective:     BP 110/72 (BP Location: Left Arm, Patient Position: Standing)   Pulse 67   Temp (!) 97.4 F (36.3 C) (Temporal)   Wt 283 lb 12.8 oz (128.7 kg)   SpO2 97%   BMI 39.95 kg/m   Physical Exam  Constitutional: She appears well-developed and well-nourished.  Non-toxic appearance. She does not appear ill. No distress.  HENT:  Head: Normocephalic and atraumatic.  Right Ear: External ear normal.  Left Ear: External ear normal.  Nose: Nose normal.  Mouth/Throat: Oropharynx is clear and moist.  No obvious papilledema on exam  Eyes: Pupils are equal, round, and reactive to light. Conjunctivae and EOM are normal. Right eye exhibits no discharge. Left eye exhibits no discharge.  Neck: Normal range of motion.  Cardiovascular: Normal rate, regular rhythm, normal heart sounds and normal pulses.    No systolic murmur is present.  No  diastolic murmur is present. Pulses:      Radial pulses are 2+ on the right side, and 2+ on the left side.  Pulmonary/Chest: Effort normal and breath sounds normal. No respiratory distress. She has no wheezes. She has no rales.  Reproducible pain over costochondral junctions on left sternal lower border  Abdominal: Soft. She exhibits no distension. There is tenderness.  Right and left lower abdominal pain with deep palpation.  Lymphadenopathy:    She has no cervical adenopathy.  Skin: Skin is warm and dry. No rash noted.  Nursing note and vitals reviewed. Uvula is midline No meningeal signs        Assessment & Plan:   1. Costochondral chest pain 3rd office visit for both tight chest pain complaints and lower abdominal pains.  Explored and emotional/stressors and offered opportunity to meet with [facility] but patient declined when offered twice during the office visit.  Chest pain can  be re-created with deep palpation to left lower sternum.  Normal vital signs but adult is overweight for height, BMI  98 % with Weight at 99th %. Do not believe chest pain to be cardiac in nature although she did report some discomfort into her jaw. I was able to cause the "same pain" with palpation to chest/sternum so believe this to be more skeletal/muscle pain vs true cardiac.  Recommended use of Motrin/ibuprofen 600 mg every 6-8 hours over the next week and to evaluate if any improvement in pain, she has not taken medication before.  2. Lower abdominal pain ? Pelvic inflammatory disease given pain with lower left, mid abdominal palpation. Dermoid cyst? She is tender to palpation and tearful during lower abdominal exam. Unclear if she has not cleared the BV or possibly has PID? I do not feel stool in the descending colon, so am not suspicious for constipation.  Will repeat labs.  She is not pregnant per urine pregnancy test today.   - C. trachomatis/N. gonorrhoeae RNA - POCT urine pregnancy - WET PREP BY  MOLECULAR PROBE  Spoke with Harvin Hazel NP about patient and comparing concerns from todays visit.  Will have patient scheduled next week to see C. Millican for pelvic exam and to determine need for further work up if lower abdominal complaints are continuing.  Spoke with patient per phone and she will agreeable to follow up next week.  3. Vaginal discharge Completing Flagyl for BV diagnosed 10/06/17.  Instructed to complete take all medication. Discouraged from practice of using soap for personal genital hygiene.  Verified that Ms Morace is not drinking alcohol while on Flagyl and she denies.  - C. trachomatis/N. gonorrhoeae RNA - POCT urine pregnancy - negative - WET PREP BY MOLECULAR PROBE  Contact number [phone]  4. Episodic tension-type headache, not intractable Poor fluid intake during the work day.  Poor sleep hygiene and has a very physical job.  I did discussion transitioning care to Adult/FP office and provided patient with contact number. Work note provided for today.  Supportive care and return precautions reviewed.  Parent verbalizes understanding and motivation to comply with all instructions.  Offer flu vaccine, but patient declined.  Medical decision-making:  > 25 minutes spent, more than 50% of appointment was spent discussing diagnosis, review of previous office visit notes and management of symptoms  Follow up:  None planned, return precautions if symptoms not improving/resolving.   Pixie Casino MSN, CPNP, CDE   Addendum 10/14/17:  Labs reviewed:  Called patient 10/14/17 to report need to treat for chlymadia to receive 1 gm orally today and provided treatment for partner with instructions. Results for DARTHA, ROZZELL (MRN [id number]) as of 10/14/2017 16:25  Ref. Range 10/13/2017 11:02  Trichomonas vaginosis Unknown Not Detected  Candida species Unknown Not Detected  Gardnerella vaginalis Unknown Detected. Increased levels of G. vaginalis may not be  significant in the absence of signs and sym... (A)  C. Trachomatis RNA, TMA Latest Ref Range: NOT DETECT  DETECTED (A)  N. gonorrhoeae RNA, TMA Latest Ref Range: NOT DETECT  NOT DETECTED  WET PREP BY MOLECULAR PROBE Unknown Rpt (A)  Source Unknown GENITAL  C. TRACHOMATIS/N. GONORRHOEAE RNA Unknown Rpt (A)   Pixie Casino MSN, CPNP, CDE

## 2017-10-14 ENCOUNTER — Ambulatory Visit (INDEPENDENT_AMBULATORY_CARE_PROVIDER_SITE_OTHER): Payer: Medicaid Other

## 2017-10-14 DIAGNOSIS — A749 Chlamydial infection, unspecified: Secondary | ICD-10-CM

## 2017-10-14 LAB — WET PREP BY MOLECULAR PROBE
Candida species: NOT DETECTED
MICRO NUMBER: 91146039
SPECIMEN QUALITY: ADEQUATE
TRICHOMONAS VAG: NOT DETECTED

## 2017-10-14 LAB — C. TRACHOMATIS/N. GONORRHOEAE RNA
C. trachomatis RNA, TMA: DETECTED — AB
N. gonorrhoeae RNA, TMA: NOT DETECTED

## 2017-10-14 MED ORDER — AZITHROMYCIN 250 MG PO TABS: 1000.0000 mg | ORAL_TABLET | Freq: Once | ORAL | Status: DC

## 2017-10-14 MED ORDER — AZITHROMYCIN 500 MG PO TABS
1000.0000 mg | ORAL_TABLET | Freq: Once | ORAL | Status: AC
  Administered 2017-10-14: 1000 mg via ORAL

## 2017-10-14 NOTE — Patient Instructions (Signed)
Chlamydia, Female Chlamydia is an STD (sexually transmitted disease). It is a bacterial infection that spreads (is contagious) through sexual contact. Chlamydia can occur in different areas of the body, including:  The tube that moves urine from the bladder out of the body (urethra).  The lower part of the uterus (cervix).  The throat.  The rectum.  This condition is not difficult to treat. However, if left untreated, chlamydia can lead to more serious health problems, including pelvic inflammatory disorder (PID). PID can increase your risk of not being able to have children (sterility). What are the causes? Chlamydia is caused by the bacteria Chlamydia trachomatis. It is passed from an infected partner during sexual activity. Chlamydia can spread through contact with the genitals, mouth, or rectum. What are the signs or symptoms? In some cases, there may not be any symptoms for this condition (asymptomatic), especially early in the infection. If symptoms develop, they may include:  Burning with urination.  Frequent urination.  Vaginal discharge.  Redness, soreness, and swelling (inflammation) of the rectum.  Bleeding or discharge from the rectum.  Abdominal pain.  Pain during sexual intercourse.  Bleeding between menstrual periods.  Itching, burning, or redness in the eyes, or discharge from the eyes.  How is this diagnosed? This condition may be diagnosed with:  Urine tests.  Swab tests. Depending on your symptoms, your health care provider may use a cotton swab to collect discharge from your vagina or rectum to test for the bacteria.  A pelvic exam.  How is this treated? This condition is treated with antibiotic medicines. If you are pregnant, certain types of antibiotics will need to be avoided. Follow these instructions at home: Medicines  Take over-the-counter and prescription medicines only as told by your health care provider.  Take your antibiotic medicine  as told by your health care provider. Do not stop taking the antibiotic even if you start to feel better. Sexual activity  Tell sexual partners about your infection. This includes any oral, anal, or vaginal sex partners you have had within 60 days of when your symptoms started. Sexual partners should also be treated, even if they have no signs of the disease.  Do not have sex until you and your sexual partners have completed treatment and your health care provider says it is okay. If your health care provider prescribed you a single dose treatment, wait 7 days after taking the treatment before having sex. General instructions  It is your responsibility to get your test results. Ask your health care provider, or the department performing the test, when your results will be ready.  Get plenty of rest.  Eat a healthy, well-balanced diet.  Drink enough fluids to keep your urine clear or pale yellow.  Keep all follow-up visits as told by your health care provider. This is important. You may need to be tested for infection again 3 months after treatment. How is this prevented? The only sure way to prevent chlamydia is to avoid having sex. However, you can lower your risk by:  Using latex condoms correctly every time you have sex.  Not having multiple sexual partners.  Asking if your sexual partner has been tested for STIs and had negative results.  Contact a health care provider if:  You develop new symptoms or your symptoms do not get better after completing treatment.  You have a fever or chills.  You have pain during sexual intercourse. Get help right away if:  Your pain gets worse and does   not get better with medicine.  You develop flu-like symptoms, such as night sweats, sore throat, or muscle aches.  You experience nausea or vomiting.  You have difficulty swallowing.  You have bleeding between periods or after sex.  You have irregular menstrual periods.  You have  abdominal or lower back pain that does not get better with medicine.  You feel weak or dizzy, or you faint.  You are pregnant and you develop symptoms of chlamydia. Summary  Chlamydia is an STD (sexually transmitted disease). It is a bacterial infection that spreads (is contagious) through sexual contact.  This condition is not difficult to treat, however. If left untreated, chlamydia can lead to more serious health problems, including pelvic inflammatory disease (PID).  In some cases, there may not be any symptoms for this condition (asymptomatic).  This condition is treated with antibiotic medicines.  Using latex condoms correctly every time you have sex can help prevent chlamydia. This information is not intended to replace advice given to you by your health care provider. Make sure you discuss any questions you have with your health care provider. Document Released: 10/16/2004 Document Revised: 12/24/2015 Document Reviewed: 12/24/2015 Elsevier Interactive Patient Education  2018 Elsevier Inc.  

## 2017-10-14 NOTE — Progress Notes (Addendum)
Pt here today for STI treatment. Allergies reviewed. Medication tolerated well. Pt education given- along with importance to abstain from sex for 7 days to ensure infection is cured. Partner treatment given.

## 2017-10-21 ENCOUNTER — Encounter: Payer: Self-pay | Admitting: Family

## 2017-10-21 ENCOUNTER — Ambulatory Visit (INDEPENDENT_AMBULATORY_CARE_PROVIDER_SITE_OTHER): Payer: Medicaid Other | Admitting: Family

## 2017-10-21 VITALS — BP 130/79 | HR 60 | Ht 71.14 in | Wt 282.0 lb

## 2017-10-21 DIAGNOSIS — Z8619 Personal history of other infectious and parasitic diseases: Secondary | ICD-10-CM | POA: Diagnosis not present

## 2017-10-21 DIAGNOSIS — Z1389 Encounter for screening for other disorder: Secondary | ICD-10-CM

## 2017-10-26 ENCOUNTER — Other Ambulatory Visit: Payer: Self-pay | Admitting: Pediatrics

## 2017-10-26 ENCOUNTER — Ambulatory Visit: Payer: Self-pay

## 2017-10-26 ENCOUNTER — Telehealth: Payer: Self-pay

## 2017-10-26 MED ORDER — FLUCONAZOLE 150 MG PO TABS: ORAL_TABLET | ORAL | 0 refills | Status: DC

## 2017-10-26 NOTE — Telephone Encounter (Signed)
Called to cancel upcoming nurse visit for today for testing is too soon. She states she just finished up Flagyl but now is having increased vaginal discharge and itching. Routing to [place].

## 2017-10-26 NOTE — Telephone Encounter (Signed)
Spoke with patient and gave medication directions. Follow up scheduled.Patient to come in if symptoms persist or worsen.

## 2017-10-26 NOTE — Telephone Encounter (Signed)
Likely yeast infection from all antibiotics prescribed in regards to recent infections. Diflucan 150 mg today and 3 days from now sent to [facility].

## 2017-10-27 ENCOUNTER — Encounter: Payer: Self-pay | Admitting: Family

## 2017-10-27 NOTE — Progress Notes (Signed)
History was provided by the patient.  Kelli Mcpherson is a 22 y.o. female who is here for follow up after her last visit on 09/24 for abdominal pain. She was treated for chlamydia on 10/14/17 and returns today to ensure symptoms are resolved and to confirm there is no concern for PID.   PCP confirmed? Yes.    Tilman Neat, MD  HPI:  -she has no concerns today and is unsure why she is here -her partner also received tx for chlamydia -she has not had intercourse since tx   Review of Systems  Constitutional: Negative for chills, fever and malaise/fatigue.  HENT: Negative for sore throat.   Respiratory: Negative for cough.   Gastrointestinal: Negative for abdominal pain and diarrhea.  Genitourinary: Negative for dysuria and flank pain.  Musculoskeletal: Negative for joint pain and myalgias.  Skin: Negative for rash.  Neurological: Negative for dizziness.    Patient Active Problem List   Diagnosis Date Noted  . Costochondral chest pain 10/13/2017  . Episodic tension-type headache, not intractable 10/13/2017  . Folliculitis 08/10/2017  . Obesity 08/01/2015    Current Outpatient Medications on File Prior to Visit  Medication Sig Dispense Refill  . metroNIDAZOLE (FLAGYL) 500 MG tablet Take 1 tablet (500 mg total) by mouth 2 (two) times daily. 14 tablet 0   No current facility-administered medications on file prior to visit.     No Known Allergies  Physical Exam:    Vitals:   10/21/17 1345  BP: 130/79  Pulse: 60  Weight: 282 lb (127.9 kg)  Height: 5' 11.14" (1.807 m)    Growth percentile SmartLinks can only be used for patients less than 71 years old. No LMP recorded.  Physical Exam  Constitutional: No distress.  Eyes: Pupils are equal, round, and reactive to light. EOM are normal.  Cardiovascular: Normal rate and regular rhythm.  No murmur heard. Pulmonary/Chest: Effort normal.  Genitourinary: Vagina normal. Pelvic exam was performed with patient supine. No  vaginal discharge found.  Genitourinary Comments: Negative CMT  No discharge, lesions No adnexal tenderness or pain on palpation   Neurological: She is alert.  Skin: Skin is warm and dry. No rash noted.  Psychiatric: She has a normal mood and affect.    Assessment/Plan: 1. History of chlamydia -her pelvic exam is benign today  -condom use reviewed -EC use reviewed -she does not want birth control at this time -reviewed that we will screen for reinfection at about 4 weeks

## 2017-11-19 ENCOUNTER — Ambulatory Visit (INDEPENDENT_AMBULATORY_CARE_PROVIDER_SITE_OTHER): Payer: Medicaid Other

## 2017-11-19 DIAGNOSIS — Z113 Encounter for screening for infections with a predominantly sexual mode of transmission: Secondary | ICD-10-CM | POA: Diagnosis not present

## 2017-11-19 NOTE — Progress Notes (Signed)
Pt here today for STI screening. Pt c/o increased vaginal discharge and took Diflucan for yeast on 10/7 with no relief. Patient given return precautions and pt would like labs released via MyChart.

## 2017-11-20 LAB — WET PREP BY MOLECULAR PROBE
CANDIDA SPECIES: NOT DETECTED
MICRO NUMBER:: 91311778
SPECIMEN QUALITY:: ADEQUATE
Trichomonas vaginosis: NOT DETECTED

## 2017-11-20 LAB — C. TRACHOMATIS/N. GONORRHOEAE RNA
C. TRACHOMATIS RNA, TMA: DETECTED — AB
N. gonorrhoeae RNA, TMA: DETECTED — AB

## 2017-11-21 ENCOUNTER — Other Ambulatory Visit: Payer: Self-pay

## 2017-11-21 ENCOUNTER — Encounter (HOSPITAL_COMMUNITY): Payer: Self-pay | Admitting: Emergency Medicine

## 2017-11-21 ENCOUNTER — Emergency Department (HOSPITAL_COMMUNITY)
Admission: EM | Admit: 2017-11-21 | Discharge: 2017-11-21 | Disposition: A | Discharge status: Home / Self Care | Payer: Self-pay | Attending: Emergency Medicine | Admitting: Emergency Medicine

## 2017-11-21 DIAGNOSIS — A549 Gonococcal infection, unspecified: Secondary | ICD-10-CM | POA: Insufficient documentation

## 2017-11-21 DIAGNOSIS — Z79899 Other long term (current) drug therapy: Secondary | ICD-10-CM | POA: Insufficient documentation

## 2017-11-21 DIAGNOSIS — Z87891 Personal history of nicotine dependence: Secondary | ICD-10-CM | POA: Insufficient documentation

## 2017-11-21 DIAGNOSIS — A749 Chlamydial infection, unspecified: Secondary | ICD-10-CM | POA: Insufficient documentation

## 2017-11-21 HISTORY — DX: Gonococcal infection, unspecified: A54.9

## 2017-11-21 HISTORY — DX: Chlamydial infection, unspecified: A74.9

## 2017-11-21 MED ORDER — CEFTRIAXONE SODIUM 250 MG IJ SOLR
250.0000 mg | Freq: Once | INTRAMUSCULAR | Status: AC
  Administered 2017-11-21: 250 mg via INTRAMUSCULAR
  Filled 2017-11-21: qty 250

## 2017-11-21 MED ORDER — AZITHROMYCIN 250 MG PO TABS
1000.0000 mg | ORAL_TABLET | Freq: Once | ORAL | Status: AC
  Administered 2017-11-21: 1000 mg via ORAL
  Filled 2017-11-21: qty 4

## 2017-11-21 NOTE — ED Provider Notes (Addendum)
[facility] EMERGENCY DEPARTMENT Provider Note   CSN: [id number] Arrival date & time: 11/21/17  1008     History   Chief Complaint No chief complaint on file.   HPI Kelli Mcpherson is a 22 y.o. female.  22 year old female presents for STD treatment.  Patient went to her PCP 2 days ago, tested positive for both gonorrhea and chlamydia and does not want to wait until Monday for treatment.  Patient reports white discharge that has been a recurrent problem for her over the past year.  Patient has been treated for gonorrhea and chlamydia in the past.  Patient is sexually active with more than one female partner, does not use condoms.  Denies pain, fevers, any other complaints or concerns today.     Past Medical History:  Diagnosis Date  . Chlamydia   . COVID-19   . Eczema   . Gestational diabetes   . Gonorrhea   . Pregnancy induced hypertension     Patient Active Problem List   Diagnosis Date Noted  . Pre-eclampsia 06/08/2019  . COVID-19 affecting pregnancy in third trimester 05/29/2019  . DKA (diabetic ketoacidosis) (HCC) 05/29/2019  . Vaginal discharge 02/04/2018  . Costochondral chest pain 10/13/2017  . Episodic tension-type headache, not intractable 10/13/2017  . Obesity 08/01/2015    Past Surgical History:  Procedure Laterality Date  . LYMPHADENECTOMY    . WISDOM TOOTH EXTRACTION       OB History    Gravida  1   Para  1   Term      Preterm  1   AB      Living  1     SAB      TAB      Ectopic      Multiple  0   Live Births  1            Home Medications    Prior to Admission medications   Medication Sig Start Date End Date Taking? Authorizing Provider  acetaminophen (TYLENOL) 500 MG tablet Take 500 mg by mouth every 6 (six) hours as needed.    [provider]  Acetaminophen-Codeine (TYLENOL/CODEINE #3) 300-30 MG tablet Take 1 tablet by mouth every 6 (six) hours as needed for pain. 06/20/19   Nugent, Odie Sera, NP  enalapril (VASOTEC) 10 MG tablet Take 1 tablet (10 mg total) by mouth daily. 06/20/19 07/20/19  Nugent, Odie Sera, NP  furosemide (LASIX) 20 MG tablet Take 1 tablet (20 mg total) by mouth daily for 10 days. 06/20/19 06/30/19  Nugent, Odie Sera, NP  ibuprofen (ADVIL) 600 MG tablet Take 1 tablet (600 mg total) by mouth every 6 (six) hours. 06/11/19   Marcelle Overlie, MD  metroNIDAZOLE (FLAGYL) 500 MG tablet Take 1 tablet (500 mg total) by mouth 2 (two) times daily for 7 days. 06/27/19 07/04/19  LampteyBritta Mccreedy, MD  Prenatal Vit-Fe Phos-FA-Omega (VITAFOL GUMMIES) 3.33-0.333-34.8 MG CHEW Chew 3 each by mouth daily. 04/13/19   [provider]    Family History Family History  Problem Relation Age of Onset  . Diabetes Mother   . Hypertension Mother   . Hypertension Father     Social History Social History   Tobacco Use  . Smoking status: Former Smoker    Types: Cigars  . Smokeless tobacco: Former Neurosurgeon  . Tobacco comment: patient currently does not smoke   Substance Use Topics  . Alcohol use: Never    Comment: socially  . Drug  use: Never     Allergies   Patient has no known allergies.   Review of Systems Review of Systems  Constitutional: Negative for fever.  Genitourinary: Positive for vaginal discharge. Negative for menstrual problem, pelvic pain and vaginal pain.  Musculoskeletal: Negative for back pain.  Skin: Negative for rash and wound.  Allergic/Immunologic: Negative for immunocompromised state.  Hematological: Negative for adenopathy.  Psychiatric/Behavioral: Negative for confusion.  All other systems reviewed and are negative.    Physical Exam Updated Vital Signs BP 133/81 (BP Location: Right Arm)   Pulse 82   Temp 98.8 F (37.1 C) (Oral)   Resp 18   LMP 11/13/2017   SpO2 99%   Physical Exam  Constitutional: She is oriented to person, place, and time. She appears well-developed and well-nourished. No distress.  HENT:  Head: Normocephalic and  atraumatic.  Cardiovascular: Normal rate, regular rhythm, normal heart sounds and intact distal pulses.  No murmur heard. Pulmonary/Chest: Effort normal and breath sounds normal. No respiratory distress.  Neurological: She is alert and oriented to person, place, and time.  Skin: Skin is warm and dry. She is not diaphoretic.  Psychiatric: She has a normal mood and affect. Her behavior is normal.  Nursing note and vitals reviewed.    ED Treatments / Results  Labs (all labs ordered are listed, but only abnormal results are displayed) Labs Reviewed - No data to display  EKG    Radiology No results found.  Procedures Procedures (including critical care time)  Medications Ordered in ED Medications  cefTRIAXone (ROCEPHIN) injection 250 mg (250 mg Intramuscular Given 11/21/17 1043)  azithromycin (ZITHROMAX) tablet 1,000 mg (1,000 mg Oral Given 11/21/17 1043)     Initial Impression / Assessment and Plan / ED Course  I have reviewed the triage vital signs and the nursing notes.  Pertinent labs & imaging results that were available during my care of the patient were reviewed by me and considered in my medical decision making (see chart for details).  Clinical Course as of Jun 27 705  Sat Nov 21, 2017  2362 22 year old female presents for med for gonorrhea and chlamydia, positive test after pelvic exam with her PCP 2 days ago.  Patient declines pelvic exam today, I feel this is reasonable after reviewing her PCP note from 2 days ago.  Discussed concerns regarding recurrent infection with patient, discussed repercussions of untreated or recurrent STD.  Advised patient to abstain from intercourse for the next 10 days.  Patient was given information on free and confidential health department testing sites for herself and her partners.  Patient was treated today with Rocephin and Zithromax.  All questions were answered and patient is ready for discharge. Patient has a implanted birth control  device.   [LM]    Clinical Course User Index [LM] Jeannie Fend, PA-C   Final Clinical Impressions(s) / ED Diagnoses   Final diagnoses:  Gonorrhea  Chlamydia    ED Discharge Orders    None       Jeannie Fend, PA-C 11/21/17 1051    Tilden Fossa, MD 11/22/17 0745   Note addenda to change drug use history per patient request. Review of available UDS on file, negative for any drug use.    Jeannie Fend, PA-C 06/28/19 1610    Tilden Fossa, MD 06/28/19 [phone]

## 2017-11-21 NOTE — Discharge Instructions (Addendum)
Abstain from intercourse until you know your test results. Call the hospital in 3-5 days for your results. IF your gonorrhea and chlamydia tests are negative, you may resume intercourse. IF either test is positive, your partner will need to go to the health department for free treatment. IF your test is positive, both partners need to abstain from intercourse for 10 days after treatment (this includes all forms of intercourse).  ° °

## 2017-11-21 NOTE — ED Triage Notes (Signed)
Pt has white vaginal discharge. She had a pap and provided a urine and was diagnosed with chlamydia, she is here for treatment for this, states they wouldn't be able to treat her until Monday.

## 2017-12-09 ENCOUNTER — Other Ambulatory Visit: Payer: Self-pay

## 2017-12-09 ENCOUNTER — Encounter: Payer: Self-pay | Admitting: Family

## 2017-12-09 ENCOUNTER — Ambulatory Visit (INDEPENDENT_AMBULATORY_CARE_PROVIDER_SITE_OTHER): Payer: Medicaid Other | Admitting: Family

## 2017-12-09 ENCOUNTER — Other Ambulatory Visit (HOSPITAL_COMMUNITY)
Admission: RE | Admit: 2017-12-09 | Discharge: 2017-12-09 | Disposition: A | Discharge status: Home / Self Care | Payer: Medicaid Other | Source: Ambulatory Visit | Attending: Family | Admitting: Family

## 2017-12-09 VITALS — BP 136/79 | HR 60 | Ht 70.87 in | Wt 287.2 lb

## 2017-12-09 DIAGNOSIS — Z124 Encounter for screening for malignant neoplasm of cervix: Secondary | ICD-10-CM | POA: Insufficient documentation

## 2017-12-09 NOTE — Progress Notes (Signed)
History was provided by the patient.  Kelli Mcpherson is a 22 y.o. female who is here for PAP and contraceptive counseling.   PCP confirmed? No.  Prose, Presidio Bing, MD  HPI:  Patient here for PAP screening and contraceptive counseling.  Pt states that she was upset that she had previously tested positive for chlamydia and gonorrhea and received the results on My Chart but was unable to receive treatment until the next Monday, so patient went to the ED for treatment.  Pt was reminded that Urgent Care might have been a better choice for care.  Patient states that she wants to find a PCP so she can have care when she needs it.   Patient reports that she has had no vaginal discharge since the treatment was complete and has not been sexually active since before treatment.     Review of Systems  Constitutional: Negative.   HENT: Negative for ear pain, nosebleeds, sore throat and tinnitus.   Eyes: Negative for blurred vision and double vision.  Respiratory: Negative for cough and shortness of breath.   Cardiovascular: Negative for chest pain.  Gastrointestinal: Positive for diarrhea (pt states she has been on multi rounds of antibiotics and thinks this may be the cause). Negative for abdominal pain, heartburn and nausea.  Genitourinary: Negative for dysuria.  Musculoskeletal: Negative for joint pain and myalgias.  Skin: Negative for rash.  Neurological: Negative for dizziness, speech change, seizures, loss of consciousness, weakness and headaches.  Endo/Heme/Allergies: Negative for environmental allergies.  Psychiatric/Behavioral: Negative.     Patient Active Problem List   Diagnosis Date Noted  . Costochondral chest pain 10/13/2017  . Episodic tension-type headache, not intractable 10/13/2017  . Obesity 08/01/2015    Current Outpatient Medications on File Prior to Visit  Medication Sig Dispense Refill  . fluconazole (DIFLUCAN) 150 MG tablet Take 1 tablet today and 1 tablet 3 days from  now (Patient not taking: Reported on 12/09/2017) 2 tablet 0  . metroNIDAZOLE (FLAGYL) 500 MG tablet Take 1 tablet (500 mg total) by mouth 2 (two) times daily. (Patient not taking: Reported on 12/09/2017) 14 tablet 0   No current facility-administered medications on file prior to visit.     No Known Allergies  Physical Exam:    Vitals:   12/09/17 0934  BP: 136/79  Pulse: 60  Weight: 287 lb 3.2 oz (130.3 kg)  Height: 5' 10.87" (1.8 m)    Growth percentile SmartLinks can only be used for patients less than 42 years old. Patient's last menstrual period was 11/13/2017.  Physical Exam  Constitutional: She appears well-developed and well-nourished.  HENT:  Head: Normocephalic.  Right Ear: External ear normal.  Left Ear: External ear normal.  Nose: Nose normal.  Mouth/Throat: Oropharynx is clear and moist.  Eyes: Pupils are equal, round, and reactive to light. Conjunctivae and EOM are normal.  Neck: Normal range of motion. Neck supple.  Cardiovascular: Regular rhythm, normal heart sounds and intact distal pulses.  No murmur heard. Pulmonary/Chest: Effort normal and breath sounds normal. No respiratory distress. She has no wheezes. She has no rales.  Genitourinary: Vagina normal.  Genitourinary Comments: No CMT, abdominal girth prevents full exam.  Cervix is ectropic and friable.   Vagina no discharge or bleeding.    Musculoskeletal: Normal range of motion.  Neurological: She is alert. No cranial nerve deficit or sensory deficit. She exhibits normal muscle tone.  Skin: Skin is warm and dry. Capillary refill takes less than 2 seconds.  Assessment/Plan:   This 22 year old female is here for PAP exam and testing for reinfection and contraceptive counseling.   1.  PAP exam - PAP exam completed, samples will be sent for testing for STI's.  Will call patient with results.   2. Contraceptive counseling - Discussed various methods for contraception, patients states that she is not a  reliable pill taker and that she would need a method that did not have to be taken daily.  Patient was given condoms and offered Plan B if needed.  Patients is aware that she can return to this clinic for all her  GYN needs.   3. STI screening - PAP samples sent additionally for STI screening.  Patient will be called with results.   Patient given return precautions.

## 2017-12-14 LAB — CYTOLOGY - PAP
Bacterial vaginitis: POSITIVE — AB
CHLAMYDIA, DNA PROBE: NEGATIVE
Candida vaginitis: NEGATIVE
NEISSERIA GONORRHEA: NEGATIVE
Trichomonas: NEGATIVE

## 2017-12-15 ENCOUNTER — Ambulatory Visit: Payer: Medicaid Other | Admitting: Family

## 2017-12-16 ENCOUNTER — Ambulatory Visit: Payer: Medicaid Other | Admitting: Family

## 2017-12-30 ENCOUNTER — Encounter: Payer: Self-pay | Admitting: Family

## 2017-12-30 NOTE — Progress Notes (Signed)
History was provided by the patient .  Kelli Mcpherson is a 22 y.o. female who is here for PAP exam due.   PCP confirmed? Yes.    [name], [name], MD  HPI:   -patient present for PAP smear -reviewed with patient about going to ER for chlamydia treatment after she received test results on Friday and could not be seen in clinic until Monday.  -patient verbalized frustration with having to wait; she took treatment, no sex since that time; no new symptoms.  -having some diarrhea since abx use.   Review of Systems  Constitutional: Negative for malaise/fatigue.  Eyes: Negative for double vision.  Respiratory: Negative for shortness of breath.   Cardiovascular: Negative for chest pain and palpitations.  Gastrointestinal: Positive for diarrhea. Negative for abdominal pain, constipation, nausea and vomiting.  Genitourinary: Negative for dysuria.  Musculoskeletal: Negative for joint pain and myalgias.  Skin: Negative for rash.  Neurological: Negative for dizziness and headaches.  Endo/Heme/Allergies: Does not bruise/bleed easily.      Patient Active Problem List   Diagnosis Date Noted  . Costochondral chest pain 10/13/2017  . Episodic tension-type headache, not intractable 10/13/2017  . Obesity 08/01/2015    Current Outpatient Medications on File Prior to Visit  Medication Sig Dispense Refill  . fluconazole (DIFLUCAN) 150 MG tablet Take 1 tablet today and 1 tablet 3 days from now (Patient not taking: Reported on 12/09/2017) 2 tablet 0  . metroNIDAZOLE (FLAGYL) 500 MG tablet Take 1 tablet (500 mg total) by mouth 2 (two) times daily. (Patient not taking: Reported on 12/09/2017) 14 tablet 0   No current facility-administered medications on file prior to visit.     No Known Allergies  Physical Exam:    Vitals:   12/09/17 0934  BP: 136/79  Pulse: 60  Weight: 287 lb 3.2 oz (130.3 kg)  Height: 5' 10.87" (1.8 m)    Growth percentile SmartLinks can only be used for patients  less than 21 years old. Patient's last menstrual period was 11/13/2017.  Physical Exam  Constitutional: She appears well-developed. No distress.  Eyes: Pupils are equal, round, and reactive to light. EOM are normal.  Cardiovascular: Normal rate.  Pulmonary/Chest: Breath sounds normal.  Genitourinary: Vagina normal and uterus normal. No vaginal discharge found.  Genitourinary Comments: Cervix friable, ectropion   Musculoskeletal: She exhibits no edema.  Neurological: She is alert.  Skin: Skin is warm. No rash noted.  Psychiatric: She has a normal mood and affect.  Nursing note and vitals reviewed.   Assessment/Plan: 1. Pap smear for cervical cancer screening -will send for pathology per guidelines -reviewed ectropion findings, will call with results  -will repeat gc/c today on PAP  - Cytology - PAP

## 2018-02-04 ENCOUNTER — Encounter: Payer: Self-pay | Admitting: Family

## 2018-02-04 ENCOUNTER — Ambulatory Visit (INDEPENDENT_AMBULATORY_CARE_PROVIDER_SITE_OTHER): Payer: Medicaid Other | Admitting: Family

## 2018-02-04 ENCOUNTER — Other Ambulatory Visit (HOSPITAL_COMMUNITY)
Admission: RE | Admit: 2018-02-04 | Discharge: 2018-02-04 | Disposition: A | Discharge status: Home / Self Care | Payer: Medicaid Other | Source: Ambulatory Visit | Attending: Pediatrics | Admitting: Pediatrics

## 2018-02-04 VITALS — BP 116/72 | HR 64 | Ht 71.0 in | Wt 275.0 lb

## 2018-02-04 DIAGNOSIS — Z3202 Encounter for pregnancy test, result negative: Secondary | ICD-10-CM

## 2018-02-04 DIAGNOSIS — Z113 Encounter for screening for infections with a predominantly sexual mode of transmission: Secondary | ICD-10-CM | POA: Insufficient documentation

## 2018-02-04 DIAGNOSIS — Z124 Encounter for screening for malignant neoplasm of cervix: Secondary | ICD-10-CM

## 2018-02-04 DIAGNOSIS — N898 Other specified noninflammatory disorders of vagina: Secondary | ICD-10-CM | POA: Insufficient documentation

## 2018-02-04 LAB — POCT URINE PREGNANCY: PREG TEST UR: NEGATIVE

## 2018-02-04 NOTE — Progress Notes (Signed)
I have reviewed the resident's note and plan of care and helped develop the plan as necessary.  Murlean Seelye, FNP   

## 2018-02-04 NOTE — Progress Notes (Signed)
Adolescent Medicine Consultation Follow-Up Visit Kelli Mcpherson  is a 23 y.o. female referred by Tilman Neat, MD here today for follow-up.    Previsit planning completed:  yes  Growth Chart Viewed? yes  History was provided by the patient.  PCP Confirmed?  yes  My Chart Activated?   yes   HPI:    Vaginal discharge Present x 5 days.  Concerned she may have an STD.  H/o chlamydia and gonorrhea within the past year.  She was treated for both. She is sexually active with males only and is currently involved with one partner. She is not on [facility], sometimes uses condoms.  No desire for pregnancy at this time.   LMP in December 2019 but she does not recall the specific date as it is irregular.  Last sexual encounter was January 2nd.    Health Maintenance: -due for Pap smear today, unsatisfactory sample obtained at last visit   No Known Allergies  Patient Active Problem List   Diagnosis Date Noted  . Costochondral chest pain 10/13/2017  . Episodic tension-type headache, not intractable 10/13/2017  . Obesity 08/01/2015   Confidentiality was discussed with the patient and if applicable, with caregiver as well.  The following portions of the patient's history were reviewed and updated as appropriate: allergies, current medications, past family history, past medical history, past social history, past surgical history and problem list.  Physical Exam:  Vitals:   02/04/18 0954  BP: 116/72  Pulse: 64  Weight: 275 lb (124.7 kg)  Height: 5\' 11"  (1.803 m)   BP 116/72   Pulse 64   Ht 5\' 11"  (1.803 m)   Wt 275 lb (124.7 kg)   BMI 38.35 kg/m  Body mass index: body mass index is 38.35 kg/m. Growth percentile SmartLinks can only be used for patients less than 28 years old.  Physical Exam   Gen- 23 yo female, NAD  Skin - warm, dry, no rash Chest - CTAB, normal effort Heart - RRR no MRG  Pelvic exam: VULVA: normal appearing vulva with no masses, tenderness or lesions, VAGINA:  normal appearing vagina with normal color and discharge, no lesions, CERVIX: normal appearing cervix without discharge or lesions. Neuro - alert, no focal deficits   Assessment/Plan:  Vaginal discharge Not on [facility], with last sexual encounter early January.  Negative urine preg at OV today.   -Obtaining CG chlamydia, wet prep and trichomonas RNA -Check HIV and RPR -Repeat pap today- had one in 11/2017 but unsatisfactory sample -Counseled on safe sex, patient does not desire LARC at this time.  Will inform pt of results when available.   Follow-up: as needed  Freddrick March MD [facility] PGY3

## 2018-02-05 LAB — TRICHOMONAS VAGINALIS, PROBE AMP: Trichomonas vaginalis RNA: NOT DETECTED

## 2018-02-05 LAB — C. TRACHOMATIS/N. GONORRHOEAE RNA
C. trachomatis RNA, TMA: NOT DETECTED
N. gonorrhoeae RNA, TMA: NOT DETECTED

## 2018-02-09 LAB — CYTOLOGY - PAP
Bacterial vaginitis: POSITIVE — AB
CHLAMYDIA, DNA PROBE: NEGATIVE
Candida vaginitis: NEGATIVE
NEISSERIA GONORRHEA: NEGATIVE
Trichomonas: NEGATIVE

## 2018-02-10 ENCOUNTER — Other Ambulatory Visit: Payer: Self-pay | Admitting: Pediatrics

## 2018-02-10 MED ORDER — METRONIDAZOLE 500 MG PO TABS: 500.0000 mg | ORAL_TABLET | Freq: Two times a day (BID) | ORAL | 0 refills | Status: AC

## 2018-02-10 NOTE — Progress Notes (Deleted)
    Assessment and Plan:      No follow-ups on file.    Subjective:  HPI Kelli Mcpherson is a 23 y.o. old female here with {family members:[phone]}  No chief complaint on file.   Several visit for BV (gardnerella) over past year, as well as GC and chlamydia Steady in follow up for medications  Medications/treatments tried at home: ***  Fever: *** Change in appetite: *** Change in sleep: *** Change in breathing: *** Vomiting/diarrhea/stool change: *** Change in urine: *** Change in skin: ***   Review of Systems Above   Immunizations, problem list, medications and allergies were reviewed and updated.   History and Problem List: Kelli Mcpherson has Obesity; Costochondral chest pain; Episodic tension-type headache, not intractable; and Vaginal discharge on their problem list.  Kelli Mcpherson  has a past medical history of Chlamydia, Eczema, and Gonorrhea.  Objective:   There were no vitals taken for this visit. Physical Exam Tilman Neat MD MPH 02/10/2018 8:49 PM

## 2018-02-11 ENCOUNTER — Ambulatory Visit: Payer: Medicaid Other | Admitting: Pediatrics

## 2018-03-08 ENCOUNTER — Ambulatory Visit: Payer: Medicaid Other | Admitting: Pediatrics

## 2018-04-14 ENCOUNTER — Ambulatory Visit: Payer: Medicaid Other

## 2018-07-27 ENCOUNTER — Ambulatory Visit (HOSPITAL_COMMUNITY)
Admission: EM | Admit: 2018-07-27 | Discharge: 2018-07-27 | Disposition: A | Discharge status: Home / Self Care | Payer: Medicaid Other | Attending: Emergency Medicine | Admitting: Emergency Medicine

## 2018-07-27 ENCOUNTER — Encounter (HOSPITAL_COMMUNITY): Payer: Self-pay

## 2018-07-27 ENCOUNTER — Other Ambulatory Visit: Payer: Self-pay

## 2018-07-27 DIAGNOSIS — R3989 Other symptoms and signs involving the genitourinary system: Secondary | ICD-10-CM | POA: Insufficient documentation

## 2018-07-27 LAB — POCT PREGNANCY, URINE: Preg Test, Ur: NEGATIVE

## 2018-07-27 LAB — POCT URINALYSIS DIP (DEVICE)
Bilirubin Urine: NEGATIVE
Glucose, UA: NEGATIVE mg/dL
Ketones, ur: NEGATIVE mg/dL
Nitrite: NEGATIVE
Protein, ur: NEGATIVE mg/dL
Specific Gravity, Urine: >=1.03 (ref 1.005–1.030)
Urobilinogen, UA: 0.2 mg/dL (ref 0.0–1.0)
pH: 5.5 (ref 5.0–8.0)

## 2018-07-27 MED ORDER — VALACYCLOVIR HCL 1 G PO TABS: 1000.0000 mg | ORAL_TABLET | Freq: Three times a day (TID) | ORAL | 0 refills | Status: DC

## 2018-07-27 NOTE — ED Triage Notes (Signed)
Patient presents to Urgent Care with complaints of vaginal irritation since yesterday. Patient reports she thinks somehow she got cut on her vagina, reports burning with urination.

## 2018-07-27 NOTE — Discharge Instructions (Signed)
Begin valtrex three times daily for 10 days  We are testing you for herpes, Gonorrhea, Chlamydia, Trichomonas, Yeast and Bacterial Vaginosis. We will call you if anything is positive and let you know if you require any further treatment. Please inform partners of any positive results.   Please return if symptoms not improving with treatment, development of fever, nausea, vomiting, abdominal pain.

## 2018-07-28 LAB — CERVICOVAGINAL ANCILLARY ONLY
Bacterial vaginitis: NEGATIVE
Candida vaginitis: NEGATIVE
Chlamydia: POSITIVE — AB
Neisseria Gonorrhea: NEGATIVE
Trichomonas: NEGATIVE

## 2018-07-29 ENCOUNTER — Telehealth (HOSPITAL_COMMUNITY): Payer: Self-pay | Admitting: Emergency Medicine

## 2018-07-29 MED ORDER — VALACYCLOVIR HCL 1 G PO TABS: 1000.0000 mg | ORAL_TABLET | Freq: Three times a day (TID) | ORAL | 0 refills | Status: AC

## 2018-07-29 MED ORDER — AZITHROMYCIN 250 MG PO TABS: 1000.0000 mg | ORAL_TABLET | Freq: Once | ORAL | 0 refills | Status: AC

## 2018-07-29 NOTE — Telephone Encounter (Signed)
Chlamydia is positive.  Rx po zithromax 1g #1 dose no refills was sent to the pharmacy of record.  Pt needs education to please refrain from sexual intercourse for 7 days to give the medicine time to work, sexual partners need to be notified and tested/treated.  Condoms may reduce risk of reinfection.  Recheck or followup with PCP for further evaluation if symptoms are not improving.   [facility] notified.  Pt also requesting sending valtrex to cheaper pharmacy. Will send to [facility] on [address].   Patient contacted and made aware of all results, all questions answered.

## 2018-07-29 NOTE — ED Provider Notes (Signed)
[facility]    CSN: [id number] Arrival date & time: 07/27/18  5009      History   Chief Complaint Chief Complaint  Patient presents with  . vaginal irritation    HPI Kelli Mcpherson is a 23 y.o. female no contributing past medical history presenting today for evaluation of vaginal irritation.  Patient states that beginning yesterday she has had irritation and notes burning with urination.  States that she has noticed a sore in her vaginal area and notes that the urine burns when it hits this area.  Denies any abnormal vaginal discharge.  Denies itching.  Denies pelvic pain.  Denies abnormal bleeding.  Denies specific concerns for STDs, but would like to have these checked.  HPI  Past Medical History:  Diagnosis Date  . Chlamydia   . Eczema   . Gonorrhea     Patient Active Problem List   Diagnosis Date Noted  . Vaginal discharge 02/04/2018  . Costochondral chest pain 10/13/2017  . Episodic tension-type headache, not intractable 10/13/2017  . Obesity 08/01/2015    Past Surgical History:  Procedure Laterality Date  . LYMPHADENECTOMY      OB History   No obstetric history on file.      Home Medications    Prior to Admission medications   Medication Sig Start Date End Date Taking? Authorizing Provider  valACYclovir (VALTREX) 1000 MG tablet Take 1 tablet (1,000 mg total) by mouth 3 (three) times daily for 10 days. 07/27/18 08/06/18  Jacqulyne Gladue, Elesa Hacker, PA-C    Family History Family History  Problem Relation Age of Onset  . Diabetes Mother   . Hypertension Mother   . Healthy Father     Social History Social History   Tobacco Use  . Smoking status: Former Research scientist (life sciences)  . Smokeless tobacco: Former Systems developer  . Tobacco comment: patient currently does not smoke   Substance Use Topics  . Alcohol use: Yes    Comment: socially  . Drug use: Not Currently    Types: "Crack" cocaine     Allergies   Patient has no known allergies.   Review of Systems  Review of Systems  Constitutional: Negative for fever.  Respiratory: Negative for shortness of breath.   Cardiovascular: Negative for chest pain.  Gastrointestinal: Negative for abdominal pain, diarrhea, nausea and vomiting.  Genitourinary: Positive for dysuria and genital sores. Negative for flank pain, hematuria, menstrual problem, vaginal bleeding, vaginal discharge and vaginal pain.  Musculoskeletal: Negative for back pain.  Skin: Negative for rash.  Neurological: Negative for dizziness, light-headedness and headaches.     Physical Exam Triage Vital Signs ED Triage Vitals  Enc Vitals Group     BP 07/27/18 1848 125/85     Pulse Rate 07/27/18 1848 87     Resp 07/27/18 1848 17     Temp 07/27/18 1848 98.9 F (37.2 C)     Temp Source 07/27/18 1848 Oral     SpO2 07/27/18 1848 100 %     Weight --      Height --      Head Circumference --      Peak Flow --      Pain Score 07/27/18 1846 0     Pain Loc --      Pain Edu? --      Excl. in [place]? --    No data found.  Updated Vital Signs BP 125/85 (BP Location: Right Arm)   Pulse 87   Temp 98.9 F (37.2  C) (Oral)   Resp 17   SpO2 100%   Visual Acuity Right Eye Distance:   Left Eye Distance:   Bilateral Distance:    Right Eye Near:   Left Eye Near:    Bilateral Near:     Physical Exam Vitals signs and nursing note reviewed.  Constitutional:      General: She is not in acute distress.    Appearance: She is well-developed.  HENT:     Head: Normocephalic and atraumatic.  Eyes:     Conjunctiva/sclera: Conjunctivae normal.  Neck:     Musculoskeletal: Neck supple.  Cardiovascular:     Rate and Rhythm: Normal rate and regular rhythm.     Heart sounds: No murmur.  Pulmonary:     Effort: Pulmonary effort is normal. No respiratory distress.     Breath sounds: Normal breath sounds.  Abdominal:     Palpations: Abdomen is soft.     Tenderness: There is no abdominal tenderness.     Comments: Nontender to light and deep  palpation throughout entire abdomen  Genitourinary:    Comments: Normal external female genitalia, small isolated sore to anterior perineum, tender to touch, no drainage, no surrounding induration  Vaginal mucosa pink, no significant discharge seen in vaginal vault.  Cervix is appears slightly erythematous. Skin:    General: Skin is warm and dry.  Neurological:     Mental Status: She is alert.      UC Treatments / Results  Labs (all labs ordered are listed, but only abnormal results are displayed) Labs Reviewed  POCT URINALYSIS DIP (DEVICE) - Abnormal; Notable for the following components:      Result Value   Hgb urine dipstick TRACE (*)    Leukocytes,Ua TRACE (*)    All other components within normal limits  CERVICOVAGINAL ANCILLARY ONLY - Abnormal; Notable for the following components:   Chlamydia **POSITIVE** (*)    All other components within normal limits  HSV CULTURE AND TYPING  POC URINE PREG, ED  POCT PREGNANCY, URINE    EKG   Radiology No results found.  Procedures Procedures (including critical care time)  Medications Ordered in UC Medications - No data to display  Initial Impression / Assessment and Plan / UC Course  I have reviewed the triage vital signs and the nursing notes.  Pertinent labs & imaging results that were available during my care of the patient were reviewed by me and considered in my medical decision making (see chart for details).     Trace leuks, suggestive of UTI.  Vaginal swab obtained to send off to check for causes of irritation/STDs.  Isolated sore that is painful, HSV swab obtained.  Will empirically treat with Valtrex.  Will call with results and provide further treatment if needed.Discussed strict return precautions. Patient verbalized understanding and is agreeable with plan.  Final Clinical Impressions(s) / UC Diagnoses   Final diagnoses:  Genital sore     Discharge Instructions     Begin valtrex three times daily for  10 days  We are testing you for herpes, Gonorrhea, Chlamydia, Trichomonas, Yeast and Bacterial Vaginosis. We will call you if anything is positive and let you know if you require any further treatment. Please inform partners of any positive results.   Please return if symptoms not improving with treatment, development of fever, nausea, vomiting, abdominal pain.    ED Prescriptions    Medication Sig Dispense Auth. Provider   valACYclovir (VALTREX) 1000 MG tablet Take 1 tablet (  1,000 mg total) by mouth 3 (three) times daily for 10 days. 30 tablet Sacoya Mcgourty, LindisfarneHallie C, PA-C     Controlled Substance Prescriptions  Controlled Substance Registry consulted? Not Applicable   Lew DawesWieters, Danford Tat C, [place] JerseyPA-C 07/29/18 1010

## 2018-07-30 LAB — HSV CULTURE AND TYPING

## 2018-08-03 ENCOUNTER — Telehealth (HOSPITAL_COMMUNITY): Payer: Self-pay | Admitting: Emergency Medicine

## 2018-08-03 NOTE — Telephone Encounter (Signed)
Patient contacted and made aware of all results, all questions answered. Pt enouraged to follow up with PCP. Pt educated on safe sex practices.

## 2018-08-07 ENCOUNTER — Encounter (HOSPITAL_COMMUNITY): Payer: Self-pay

## 2018-08-07 ENCOUNTER — Other Ambulatory Visit: Payer: Self-pay

## 2018-08-07 ENCOUNTER — Ambulatory Visit (HOSPITAL_COMMUNITY)
Admission: EM | Admit: 2018-08-07 | Discharge: 2018-08-07 | Disposition: A | Discharge status: Home / Self Care | Payer: Self-pay | Attending: Family Medicine | Admitting: Family Medicine

## 2018-08-07 ENCOUNTER — Ambulatory Visit (INDEPENDENT_AMBULATORY_CARE_PROVIDER_SITE_OTHER): Payer: Self-pay

## 2018-08-07 DIAGNOSIS — R079 Chest pain, unspecified: Secondary | ICD-10-CM

## 2018-08-07 MED ORDER — IBUPROFEN 800 MG PO TABS: 800.0000 mg | ORAL_TABLET | Freq: Three times a day (TID) | ORAL | 0 refills | Status: DC

## 2018-08-07 NOTE — Discharge Instructions (Signed)
You have been seen at the [facility] today for chest pain. Your evaluation today was not suggestive of any emergent condition requiring medical intervention at this time. Your chest x-ray did not show any worrisome changes. However, some medical problems make take more time to appear. Therefore, it's very important that you pay attention to any new symptoms or worsening of your current condition.  Please proceed directly to the Emergency Department immediately should you feel worse in any way or have any of the following symptoms: increasing or different chest pain, pain that spreads to your arm, neck, jaw, back or abdomen, shortness of breath, or nausea and vomiting.  

## 2018-08-07 NOTE — ED Provider Notes (Signed)
[place]   [id number] 08/07/18 Arrival Time: [place]:  1. Chest pain, unspecified type     I have personally viewed the imaging studies ordered this visit. No pneumothorax.  Patient history and exam consistent with non-cardiac cause of chest pain. Conservative measures indicated. Prescription NSAIDs per medication orders. Worsening signs and symptoms discussed and patient verbalized understanding.   Meds ordered this encounter  Medications  . ibuprofen (ADVIL) 800 MG tablet    Sig: Take 1 tablet (800 mg total) by mouth 3 (three) times daily with meals.    Dispense:  21 tablet    Refill:  0     Discharge Instructions     You have been seen at the [facility] today for chest pain. Your evaluation today was not suggestive of any emergent condition requiring medical intervention at this time. Your chest x-ray did not show any worrisome changes. However, some medical problems make take more time to appear. Therefore, it's very important that you pay attention to any new symptoms or worsening of your current condition.  Please proceed directly to the Emergency Department immediately should you feel worse in any way or have any of the following symptoms: increasing or different chest pain, pain that spreads to your arm, neck, jaw, back or abdomen, shortness of breath, or nausea and vomiting.     Chest pain precautions given. Reviewed expectations re: course of current medical issues. Questions answered. Outlined signs and symptoms indicating need for more acute intervention. Patient verbalized understanding. After Visit Summary given.   SUBJECTIVE:  History from: patient. Kelli Mcpherson is a 23 y.o. female who presents with complaint of persistent pain of L chest/side below axilla; without radiation; described as dull. Onset abrupt, early yesterday evening. Reports symptoms are unchanged since beginning. Slept well last evening;  pain did not wake her. Injury or recent strenuous activity: none. Rates as 3/10 in intensity; without associated n/v; without associated SOB. Denies: fatigue, irregular heart beat, lower extremity edema, near-syncope, orthopnea, palpitations, paroxysmal nocturnal dyspnea and syncope. Aggravating factors: include taking a deep breath; also questions noticing pain more when she is supine. Alleviating factors: have not been identified. Recent illnesses: none. Fever: absent. Ambulatory without assistance. Self/OTC treatment: none reported. No new medications. No OCP use. History of similar: no.  Illicit drug use: none.  Social History   Tobacco Use  Smoking Status Former Smoker  Smokeless Tobacco Former Systems developer  Tobacco Comment   patient currently does not smoke    Social History   Substance and Sexual Activity  Alcohol Use Yes   Comment: socially   ROS: As per HPI. All other systems negative.   OBJECTIVE:  Vitals:   08/07/18 1032  BP: 126/84  Pulse: 84  Resp: 16  Temp: 98.7 F (37.1 C)  TempSrc: Oral  SpO2: 93%    Recheck Sp02 by me: 97%  General appearance: alert, oriented, no acute distress Eyes: PERRLA; EOMI; conjunctivae normal HENT: normocephalic; atraumatic Neck: supple without FROM Lungs: without labored respirations; CTAB Heart: regular rate and rhythm without murmer Chest Wall: with tenderness to palpation over L chest wall just under axilla; no bruising or erythema Abdomen: soft, non-tender; bowel sounds normal; no masses or organomegaly; no guarding or rebound tenderness Extremities: without edema; without calf swelling or tenderness; symmetrical without gross deformities Skin: warm and dry; without rash or lesions Psychological: alert and cooperative; normal mood and affect  Imaging: Dg Chest 2 View  Result Date: 08/07/2018  CLINICAL DATA:  Acute onset of LEFT-sided chest wall pain that is worse with deep inspiration. EXAM: CHEST - 2 VIEW COMPARISON:   02/25/2017 and earlier. FINDINGS: Cardiomediastinal silhouette unremarkable. Lungs clear. Bronchovascular markings normal. Pulmonary vascularity normal. No pneumothorax. No pleural effusions. Visualized bony thorax intact. IMPRESSION: Normal examination. Electronically Signed   By: Hulan Saashomas  Lawrence M.D.   On: 08/07/2018 11:06   No Known Allergies  Past Medical History:  Diagnosis Date  . Chlamydia   . Eczema   . Gonorrhea    Social History   Socioeconomic History  . Marital status: Single    Spouse name: Not on file  . Number of children: Not on file  . Years of education: Not on file  . Highest education level: Not on file  Occupational History  . Not on file  Social Needs  . Financial resource strain: Not on file  . Food insecurity    Worry: Not on file    Inability: Not on file  . Transportation needs    Medical: Not on file    Non-medical: Not on file  Tobacco Use  . Smoking status: Former Games developermoker  . Smokeless tobacco: Former NeurosurgeonUser  . Tobacco comment: patient currently does not smoke   Substance and Sexual Activity  . Alcohol use: Yes    Comment: socially  . Drug use: Not Currently    Types: "Crack" cocaine  . Sexual activity: Yes  Lifestyle  . Physical activity    Days per week: Not on file    Minutes per session: Not on file  . Stress: Not on file  Relationships  . Social Musicianconnections    Talks on phone: Not on file    Gets together: Not on file    Attends religious service: Not on file    Active member of club or organization: Not on file    Attends meetings of clubs or organizations: Not on file    Relationship status: Not on file  . Intimate partner violence    Fear of current or ex partner: Not on file    Emotionally abused: Not on file    Physically abused: Not on file    Forced sexual activity: Not on file  Other Topics Concern  . Not on file  Social History Narrative  . Not on file   Family History  Problem Relation Age of Onset  . Diabetes  Mother   . Hypertension Mother   . Healthy Father    Past Surgical History:  Procedure Laterality Date  . Erven CollaLYMPHADENECTOMY       [name], MD 08/09/18 [phone]

## 2018-08-07 NOTE — ED Triage Notes (Signed)
Pt present left side pain underneath her left arm.  Pt denies any injury to her left side.

## 2018-09-20 DIAGNOSIS — A6000 Herpesviral infection of urogenital system, unspecified: Secondary | ICD-10-CM | POA: Insufficient documentation

## 2018-09-30 DIAGNOSIS — I10 Essential (primary) hypertension: Secondary | ICD-10-CM

## 2018-09-30 HISTORY — DX: Essential (primary) hypertension: I10

## 2018-10-28 ENCOUNTER — Encounter (HOSPITAL_COMMUNITY): Payer: Self-pay

## 2018-10-28 ENCOUNTER — Ambulatory Visit (HOSPITAL_COMMUNITY)
Admission: EM | Admit: 2018-10-28 | Discharge: 2018-10-28 | Disposition: A | Discharge status: Home / Self Care | Payer: Managed Care, Other (non HMO) | Attending: Emergency Medicine | Admitting: Emergency Medicine

## 2018-10-28 ENCOUNTER — Other Ambulatory Visit: Payer: Self-pay

## 2018-10-28 DIAGNOSIS — N390 Urinary tract infection, site not specified: Secondary | ICD-10-CM | POA: Insufficient documentation

## 2018-10-28 DIAGNOSIS — B9689 Other specified bacterial agents as the cause of diseases classified elsewhere: Secondary | ICD-10-CM | POA: Diagnosis present

## 2018-10-28 DIAGNOSIS — N760 Acute vaginitis: Secondary | ICD-10-CM | POA: Insufficient documentation

## 2018-10-28 DIAGNOSIS — R7309 Other abnormal glucose: Secondary | ICD-10-CM | POA: Diagnosis not present

## 2018-10-28 DIAGNOSIS — Z3202 Encounter for pregnancy test, result negative: Secondary | ICD-10-CM

## 2018-10-28 LAB — POCT PREGNANCY, URINE: Preg Test, Ur: NEGATIVE

## 2018-10-28 LAB — POCT URINALYSIS DIP (DEVICE)
Bilirubin Urine: NEGATIVE
Glucose, UA: NEGATIVE mg/dL
Hgb urine dipstick: NEGATIVE
Ketones, ur: NEGATIVE mg/dL
Leukocytes,Ua: NEGATIVE
Nitrite: NEGATIVE
Protein, ur: NEGATIVE mg/dL
Specific Gravity, Urine: >=1.03 (ref 1.005–1.030)
Urobilinogen, UA: 0.2 mg/dL (ref 0.0–1.0)
pH: 6.5 (ref 5.0–8.0)

## 2018-10-28 LAB — GLUCOSE, CAPILLARY: Glucose-Capillary: 89 mg/dL (ref 70–99)

## 2018-10-28 MED ORDER — METRONIDAZOLE 500 MG PO TABS: 500.0000 mg | ORAL_TABLET | Freq: Two times a day (BID) | ORAL | 0 refills | Status: DC

## 2018-10-28 MED ORDER — CEPHALEXIN 500 MG PO CAPS: 500.0000 mg | ORAL_CAPSULE | Freq: Four times a day (QID) | ORAL | 0 refills | Status: DC

## 2018-10-28 NOTE — Discharge Instructions (Signed)
We will send off other STI checks if any results return postive we will call you.  Avoid any sexual activity until results return Stay hydrated  Take medications with food  Take full dose of meds

## 2018-10-28 NOTE — ED Triage Notes (Signed)
Pt reports white vaginal discharge and urinary frequency x 2 days.

## 2018-10-28 NOTE — ED Provider Notes (Signed)
[place]    CSN: [id number] Arrival date & time: 10/28/18  1742      History   Chief Complaint Chief Complaint  Patient presents with  . Vaginal Discharge  . Urinary Frequency    HPI Kelli Mcpherson is a 23 y.o. female.   Pt states that she is having frequency and some white vaginal discharge while sitting at home. Denies any abd pain, states that she has had BV in the past unsure if this is it or not. Pt is concerned due to her last urine check she had a high sugar level and was told that she was pre diabetic and wants to have her sugar check ed to ensure that its not high. Denies any bleeding, no abd pain,      Past Medical History:  Diagnosis Date  . Chlamydia   . Eczema   . Gonorrhea     Patient Active Problem List   Diagnosis Date Noted  . Vaginal discharge 02/04/2018  . Costochondral chest pain 10/13/2017  . Episodic tension-type headache, not intractable 10/13/2017  . Obesity 08/01/2015    Past Surgical History:  Procedure Laterality Date  . LYMPHADENECTOMY      OB History   No obstetric history on file.      Home Medications    Prior to Admission medications   Medication Sig Start Date End Date Taking? Authorizing Provider  cephALEXin (KEFLEX) 500 MG capsule Take 1 capsule (500 mg total) by mouth 4 (four) times daily. 10/28/18   Marney Setting, NP  ibuprofen (ADVIL) 800 MG tablet Take 1 tablet (800 mg total) by mouth 3 (three) times daily with meals. 08/07/18   Vanessa Kick, MD  metroNIDAZOLE (FLAGYL) 500 MG tablet Take 1 tablet (500 mg total) by mouth 2 (two) times daily. 10/28/18   Marney Setting, NP    Family History Family History  Problem Relation Age of Onset  . Diabetes Mother   . Hypertension Mother   . Healthy Father     Social History Social History   Tobacco Use  . Smoking status: Former Research scientist (life sciences)  . Smokeless tobacco: Former Systems developer  . Tobacco comment: patient currently does not smoke   Substance Use  Topics  . Alcohol use: Yes    Comment: socially  . Drug use: Not Currently    Types: "Crack" cocaine     Allergies   Patient has no known allergies.   Review of Systems Review of Systems  Constitutional: Negative.   Respiratory: Negative.   Cardiovascular: Negative.   Gastrointestinal: Negative.   Genitourinary: Positive for frequency and vaginal discharge.  Neurological: Negative.      Physical Exam Triage Vital Signs ED Triage Vitals  Enc Vitals Group     BP 10/28/18 1805 134/61     Pulse Rate 10/28/18 1805 69     Resp 10/28/18 1805 16     Temp 10/28/18 1805 98 F (36.7 C)     Temp Source 10/28/18 1805 Temporal     SpO2 10/28/18 1805 100 %     Weight --      Height --      Head Circumference --      Peak Flow --      Pain Score 10/28/18 1803 0     Pain Loc --      Pain Edu? --      Excl. in [place]? --    No data found.  Updated Vital Signs BP 134/61 (BP  Location: Right Arm)   Pulse 69   Temp 98 F (36.7 C) (Temporal)   Resp 16   SpO2 100%   Visual Acuity Right Eye Distance:   Left Eye Distance:   Bilateral Distance:    Right Eye Near:   Left Eye Near:    Bilateral Near:     Physical Exam Cardiovascular:     Rate and Rhythm: Normal rate.  Pulmonary:     Effort: Pulmonary effort is normal.  Abdominal:     General: Bowel sounds are normal.     Comments: Vaginal discharge white in color small amount   Musculoskeletal: Normal range of motion.  Neurological:     General: No focal deficit present.     Mental Status: She is alert.      UC Treatments / Results  Labs (all labs ordered are listed, but only abnormal results are displayed) Labs Reviewed  URINE CULTURE  GLUCOSE, CAPILLARY  RPR  CBG MONITORING, ED  POCT URINALYSIS DIP (DEVICE)  POC URINE PREG, ED  GC/CHLAMYDIA PROBE AMP () NOT AT [facility]    EKG   Radiology No results found.  Procedures Procedures (including critical care time)  Medications Ordered in UC  Medications - No data to display  Initial Impression / Assessment and Plan / UC Course  I have reviewed the triage vital signs and the nursing notes.  Pertinent labs & imaging results that were available during my care of the patient were reviewed by me and considered in my medical decision making (see chart for details).    Will check urine and send off for other STI tests per pts request.  Will call for any positive test.  We will send off other STI checks if any results return postive we will call you.  Avoid any sexual activity until results return Stay hydrated  Take medications with food  Take full dose of meds  Final Clinical Impressions(s) / UC Diagnoses   Final diagnoses:  Lower urinary tract infectious disease  BV (bacterial vaginosis)     Discharge Instructions     We will send off other STI checks if any results return postive we will call you.  Avoid any sexual activity until results return Stay hydrated  Take medications with food  Take full dose of meds     ED Prescriptions    Medication Sig Dispense Auth. Provider   metroNIDAZOLE (FLAGYL) 500 MG tablet Take 1 tablet (500 mg total) by mouth 2 (two) times daily. 14 tablet Maple Mirza L, NP   cephALEXin (KEFLEX) 500 MG capsule Take 1 capsule (500 mg total) by mouth 4 (four) times daily. 20 capsule Coralyn Mark, NP     PDMP not reviewed this encounter.   Coralyn Mark, NP 10/28/18 [phone]

## 2018-10-29 LAB — URINE CULTURE: Culture: NO GROWTH

## 2018-10-29 LAB — RPR: RPR Ser Ql: NONREACTIVE

## 2018-11-01 LAB — CERVICOVAGINAL ANCILLARY ONLY
Bacterial vaginitis: NEGATIVE
Candida vaginitis: NEGATIVE
Chlamydia: NEGATIVE
Neisseria Gonorrhea: NEGATIVE
Trichomonas: NEGATIVE

## 2019-03-12 ENCOUNTER — Other Ambulatory Visit: Payer: Self-pay

## 2019-03-12 ENCOUNTER — Ambulatory Visit (HOSPITAL_COMMUNITY)
Admission: EM | Admit: 2019-03-12 | Discharge: 2019-03-12 | Disposition: A | Discharge status: Home / Self Care | Payer: Managed Care, Other (non HMO) | Attending: Emergency Medicine | Admitting: Emergency Medicine

## 2019-03-12 ENCOUNTER — Encounter (HOSPITAL_COMMUNITY): Payer: Self-pay

## 2019-03-12 DIAGNOSIS — R350 Frequency of micturition: Secondary | ICD-10-CM | POA: Diagnosis not present

## 2019-03-12 DIAGNOSIS — Z3201 Encounter for pregnancy test, result positive: Secondary | ICD-10-CM | POA: Diagnosis not present

## 2019-03-12 DIAGNOSIS — N390 Urinary tract infection, site not specified: Secondary | ICD-10-CM | POA: Diagnosis not present

## 2019-03-12 LAB — CBG MONITORING, ED: Glucose-Capillary: 133 mg/dL — ABNORMAL HIGH (ref 70–99)

## 2019-03-12 LAB — POCT URINALYSIS DIP (DEVICE)
Bilirubin Urine: NEGATIVE
Glucose, UA: >=1000 mg/dL — AB
Ketones, ur: 15 mg/dL — AB
Nitrite: POSITIVE — AB
Protein, ur: 30 mg/dL — AB
Specific Gravity, Urine: >=1.03 (ref 1.005–1.030)
Urobilinogen, UA: 0.2 mg/dL (ref 0.0–1.0)
pH: 5.5 (ref 5.0–8.0)

## 2019-03-12 LAB — POC URINE PREG, ED: Preg Test, Ur: POSITIVE — AB

## 2019-03-12 LAB — GLUCOSE, CAPILLARY: Glucose-Capillary: 133 mg/dL — ABNORMAL HIGH (ref 70–99)

## 2019-03-12 LAB — POCT PREGNANCY, URINE: Preg Test, Ur: POSITIVE — AB

## 2019-03-12 MED ORDER — NITROFURANTOIN MONOHYD MACRO 100 MG PO CAPS: 100.0000 mg | ORAL_CAPSULE | Freq: Two times a day (BID) | ORAL | 0 refills | Status: AC

## 2019-03-12 NOTE — Discharge Instructions (Signed)
Urine showed evidence of infection. We are treating you with macrobid twice dialy for 5 days. Be sure to take full course. Stay hydrated- urine should be pale yellow to clear.   Please return or follow up with your primary provider if symptoms not improving with treatment. Please return sooner if you have worsening of symptoms or develop fever, nausea, vomiting, abdominal pain, back pain, lightheadedness, dizziness.

## 2019-03-12 NOTE — ED Provider Notes (Signed)
[facility]    CSN: [id number] Arrival date & time: 03/12/19  1530      History   Chief Complaint Chief Complaint  Patient presents with  . Urinary Tract Infection    HPI Kelli Mcpherson is a 24 y.o. female 20 weeks, 5 days pregnant presenting today for evaluation of urinary frequency.  Patient states that over the past week she has had increased urinary frequency along with strong urinary odor.  She does report a clear discharge as well.  Has history of bacterial vaginosis as well as urinary tract infections.  She is currently on Metformin and occasionally checking blood sugars at home.  Following up with OB/GYN and diabetes management.  Denies dysuria.  Does report some lower back pain.  Denies abdominal pain, fever, nausea or vomiting.  Denies bleeding.  HPI  Past Medical History:  Diagnosis Date  . Chlamydia   . Eczema   . Gonorrhea     Patient Active Problem List   Diagnosis Date Noted  . Vaginal discharge 02/04/2018  . Costochondral chest pain 10/13/2017  . Episodic tension-type headache, not intractable 10/13/2017  . Obesity 08/01/2015    Past Surgical History:  Procedure Laterality Date  . LYMPHADENECTOMY      OB History    Gravida  1   Para      Term      Preterm      AB      Living        SAB      TAB      Ectopic      Multiple      Live Births               Home Medications    Prior to Admission medications   Medication Sig Start Date End Date Taking? Authorizing Provider  ibuprofen (ADVIL) 800 MG tablet Take 1 tablet (800 mg total) by mouth 3 (three) times daily with meals. 08/07/18   Mardella Layman, MD  nitrofurantoin, macrocrystal-monohydrate, (MACROBID) 100 MG capsule Take 1 capsule (100 mg total) by mouth 2 (two) times daily for 5 days. 03/12/19 03/17/19  Jodeci Roarty, Junius Creamer, PA-C    Family History Family History  Problem Relation Age of Onset  . Diabetes Mother   . Hypertension Mother   . Healthy Father      Social History Social History   Tobacco Use  . Smoking status: Former Games developer  . Smokeless tobacco: Former Neurosurgeon  . Tobacco comment: patient currently does not smoke   Substance Use Topics  . Alcohol use: Yes    Comment: socially  . Drug use: Not Currently    Types: "Crack" cocaine     Allergies   Patient has no known allergies.   Review of Systems Review of Systems  Constitutional: Negative for fever.  Respiratory: Negative for shortness of breath.   Cardiovascular: Negative for chest pain.  Gastrointestinal: Negative for abdominal pain, diarrhea, nausea and vomiting.  Genitourinary: Positive for frequency and vaginal discharge. Negative for dysuria, flank pain, genital sores, hematuria, menstrual problem, vaginal bleeding and vaginal pain.  Musculoskeletal: Negative for back pain.  Skin: Negative for rash.  Neurological: Negative for dizziness, light-headedness and headaches.     Physical Exam Triage Vital Signs ED Triage Vitals  Enc Vitals Group     BP 03/12/19 1607 136/84     Pulse Rate 03/12/19 1607 100     Resp 03/12/19 1607 18     Temp 03/12/19 1607  98.8 F (37.1 C)     Temp Source 03/12/19 1607 Oral     SpO2 03/12/19 1607 99 %     Weight --      Height --      Head Circumference --      Peak Flow --      Pain Score 03/12/19 1605 0     Pain Loc --      Pain Edu? --      Excl. in [place]? --    No data found.  Updated Vital Signs BP 136/84 (BP Location: Right Arm)   Pulse 100   Temp 98.8 F (37.1 C) (Oral)   Resp 18   LMP 07/31/2018 (Exact Date)   SpO2 99%   Visual Acuity Right Eye Distance:   Left Eye Distance:   Bilateral Distance:    Right Eye Near:   Left Eye Near:    Bilateral Near:     Physical Exam Vitals and nursing note reviewed.  Constitutional:      Appearance: She is well-developed.     Comments: No acute distress  HENT:     Head: Normocephalic and atraumatic.     Nose: Nose normal.  Eyes:     Conjunctiva/sclera:  Conjunctivae normal.  Cardiovascular:     Rate and Rhythm: Normal rate.  Pulmonary:     Effort: Pulmonary effort is normal. No respiratory distress.  Abdominal:     General: There is no distension.  Musculoskeletal:        General: Normal range of motion.     Cervical back: Neck supple.  Skin:    General: Skin is warm and dry.  Neurological:     Mental Status: She is alert and oriented to person, place, and time.      UC Treatments / Results  Labs (all labs ordered are listed, but only abnormal results are displayed) Labs Reviewed  GLUCOSE, CAPILLARY - Abnormal; Notable for the following components:      Result Value   Glucose-Capillary 133 (*)    All other components within normal limits  POC URINE PREG, ED - Abnormal; Notable for the following components:   Preg Test, Ur POSITIVE (*)    All other components within normal limits  POCT URINALYSIS DIP (DEVICE) - Abnormal; Notable for the following components:   Glucose, UA >=1000 (*)    Ketones, ur 15 (*)    Hgb urine dipstick MODERATE (*)    Protein, ur 30 (*)    Nitrite POSITIVE (*)    Leukocytes,Ua TRACE (*)    All other components within normal limits  POCT PREGNANCY, URINE - Abnormal; Notable for the following components:   Preg Test, Ur POSITIVE (*)    All other components within normal limits  CBG MONITORING, ED - Abnormal; Notable for the following components:   Glucose-Capillary 133 (*)    All other components within normal limits  URINE CULTURE  CERVICOVAGINAL ANCILLARY ONLY    EKG   Radiology No results found.  Procedures Procedures (including critical care time)  Medications Ordered in UC Medications - No data to display  Initial Impression / Assessment and Plan / UC Course  I have reviewed the triage vital signs and the nursing notes.  Pertinent labs & imaging results that were available during my care of the patient were reviewed by me and considered in my medical decision making (see chart  for details).    1. UTI Positive nitrites and trace leuks on UA, will  empirically treat for UTI today with Macrobid twice daily x5 days.  Urine culture pending.  Also with greater than 1000 glucose in urine, blood sugar checked and was 133.  Continue to take Metformin as prescribed, monitor sugars at home and follow-up with diabetes management.  Vaginal swab pending to evaluate for abnormal causes of discharge.  Will call with results and provide further treatment as needed.  Discussed strict return precautions. Patient verbalized understanding and is agreeable with plan.  Final Clinical Impressions(s) / UC Diagnoses   Final diagnoses:  Urinary frequency  Lower urinary tract infectious disease     Discharge Instructions     Urine showed evidence of infection. We are treating you with macrobid twice dialy for 5 days. Be sure to take full course. Stay hydrated- urine should be pale yellow to clear.   Please return or follow up with your primary provider if symptoms not improving with treatment. Please return sooner if you have worsening of symptoms or develop fever, nausea, vomiting, abdominal pain, back pain, lightheadedness, dizziness.   ED Prescriptions    Medication Sig Dispense Auth. Provider   nitrofurantoin, macrocrystal-monohydrate, (MACROBID) 100 MG capsule Take 1 capsule (100 mg total) by mouth 2 (two) times daily for 5 days. 10 capsule Wajiha Versteeg, [place] C, PA-C     PDMP not reviewed this encounter.   Joneen Caraway [place] C, PA-C 03/12/19 1725

## 2019-03-12 NOTE — ED Triage Notes (Signed)
Pt presents with urinary frequency for over a week.

## 2019-03-15 LAB — URINE CULTURE: Culture: >=100000 — AB

## 2019-03-15 LAB — CERVICOVAGINAL ANCILLARY ONLY
Bacterial vaginitis: NEGATIVE
Candida vaginitis: NEGATIVE
Chlamydia: NEGATIVE
Neisseria Gonorrhea: NEGATIVE
Trichomonas: NEGATIVE

## 2019-04-12 ENCOUNTER — Other Ambulatory Visit: Payer: Self-pay

## 2019-04-14 ENCOUNTER — Ambulatory Visit: Payer: Managed Care, Other (non HMO) | Admitting: Internal Medicine

## 2019-04-14 ENCOUNTER — Other Ambulatory Visit: Payer: Self-pay

## 2019-05-17 ENCOUNTER — Ambulatory Visit (HOSPITAL_COMMUNITY)
Admission: EM | Admit: 2019-05-17 | Discharge: 2019-05-17 | Disposition: A | Discharge status: Home / Self Care | Payer: Managed Care, Other (non HMO) | Attending: Family Medicine | Admitting: Family Medicine

## 2019-05-17 ENCOUNTER — Encounter (HOSPITAL_COMMUNITY): Payer: Self-pay

## 2019-05-17 ENCOUNTER — Other Ambulatory Visit: Payer: Self-pay

## 2019-05-17 DIAGNOSIS — O99213 Obesity complicating pregnancy, third trimester: Secondary | ICD-10-CM | POA: Insufficient documentation

## 2019-05-17 DIAGNOSIS — Z79899 Other long term (current) drug therapy: Secondary | ICD-10-CM | POA: Insufficient documentation

## 2019-05-17 DIAGNOSIS — R112 Nausea with vomiting, unspecified: Secondary | ICD-10-CM

## 2019-05-17 DIAGNOSIS — O99613 Diseases of the digestive system complicating pregnancy, third trimester: Secondary | ICD-10-CM | POA: Insufficient documentation

## 2019-05-17 DIAGNOSIS — Z3A30 30 weeks gestation of pregnancy: Secondary | ICD-10-CM | POA: Insufficient documentation

## 2019-05-17 DIAGNOSIS — O212 Late vomiting of pregnancy: Secondary | ICD-10-CM | POA: Insufficient documentation

## 2019-05-17 DIAGNOSIS — R12 Heartburn: Secondary | ICD-10-CM | POA: Diagnosis not present

## 2019-05-17 DIAGNOSIS — Z87891 Personal history of nicotine dependence: Secondary | ICD-10-CM | POA: Diagnosis not present

## 2019-05-17 DIAGNOSIS — U071 COVID-19: Secondary | ICD-10-CM | POA: Diagnosis not present

## 2019-05-17 DIAGNOSIS — E669 Obesity, unspecified: Secondary | ICD-10-CM | POA: Insufficient documentation

## 2019-05-17 MED ORDER — ONDANSETRON 8 MG PO TBDP: 8.0000 mg | ORAL_TABLET | Freq: Three times a day (TID) | ORAL | 0 refills | Status: DC | PRN

## 2019-05-17 NOTE — Discharge Instructions (Addendum)
Avoid fried food or dairy for the next two days

## 2019-05-17 NOTE — ED Triage Notes (Signed)
Pt reports she was exposed to her brother, who tested positive for covid yesterday. Reports emesis, which she has not had during her pregnancy.

## 2019-05-17 NOTE — ED Provider Notes (Signed)
[facility]    CSN: [id number] Arrival date & time: 05/17/19  6295      History   Chief Complaint Chief Complaint  Patient presents with  . Covid Exposure    HPI Kelli Mcpherson is a 24 y.o. female.   Established [facility] patient  Pt reports she was exposed to her brother, who tested positive for covid yesterday. Reports emesis, which she has not had during her pregnancy.  Patient is currently about [redacted] weeks pregnant.  She is gravida 1 para 0.  Patient's had some nocturnal heartburn and called her obstetrician.  The latter recommended that she get started on some Pepcid.  Patient's had no abdominal pain or urinary symptoms.  She has retained her sense of smell.  She has had a mild occasional cough.  She denies abdominal pain.  She is currently taking her prenatal vitamins.  Patient notes that after eating chicken nuggets yesterday she had her first episode of vomiting.     Past Medical History:  Diagnosis Date  . Chlamydia   . Eczema   . Gonorrhea     Patient Active Problem List   Diagnosis Date Noted  . Vaginal discharge 02/04/2018  . Costochondral chest pain 10/13/2017  . Episodic tension-type headache, not intractable 10/13/2017  . Obesity 08/01/2015    Past Surgical History:  Procedure Laterality Date  . LYMPHADENECTOMY      OB History    Gravida  1   Para      Term      Preterm      AB      Living        SAB      TAB      Ectopic      Multiple      Live Births               Home Medications    Prior to Admission medications   Medication Sig Start Date End Date Taking? Authorizing Provider  ibuprofen (ADVIL) 800 MG tablet Take 1 tablet (800 mg total) by mouth 3 (three) times daily with meals. 08/07/18   Mardella Layman, MD  ondansetron (ZOFRAN-ODT) 8 MG disintegrating tablet Take 1 tablet (8 mg total) by mouth every 8 (eight) hours as needed for nausea. 05/17/19   Elvina Sidle, MD    Family History Family History   Problem Relation Age of Onset  . Diabetes Mother   . Hypertension Mother   . Healthy Father     Social History Social History   Tobacco Use  . Smoking status: Former Games developer  . Smokeless tobacco: Former Neurosurgeon  . Tobacco comment: patient currently does not smoke   Substance Use Topics  . Alcohol use: Not Currently    Comment: socially  . Drug use: Not Currently    Types: "Crack" cocaine     Allergies   Patient has no known allergies.   Review of Systems Review of Systems  Constitutional: Negative.   Gastrointestinal: Positive for nausea and vomiting. Negative for abdominal pain.     Physical Exam Triage Vital Signs ED Triage Vitals  Enc Vitals Group     BP 05/17/19 1002 140/88     Pulse Rate 05/17/19 1002 (!) 123     Resp 05/17/19 1002 16     Temp 05/17/19 1002 98.7 F (37.1 C)     Temp Source 05/17/19 1002 Oral     SpO2 05/17/19 1002 99 %     Weight --  Height --      Head Circumference --      Peak Flow --      Pain Score 05/17/19 1001 0     Pain Loc --      Pain Edu? --      Excl. in [place]? --    No data found.  Updated Vital Signs BP 140/88 (BP Location: Right Arm)   Pulse (!) 123   Temp 98.7 F (37.1 C) (Oral)   Resp 16   LMP 07/31/2018 (Exact Date)   SpO2 99%    Physical Exam Vitals and nursing note reviewed.  Constitutional:      Appearance: Normal appearance. She is obese.  HENT:     Mouth/Throat:     Mouth: Mucous membranes are moist.  Eyes:     Extraocular Movements: Extraocular movements intact.     Conjunctiva/sclera: Conjunctivae normal.     Pupils: Pupils are equal, round, and reactive to light.  Cardiovascular:     Rate and Rhythm: Tachycardia present.  Pulmonary:     Effort: Pulmonary effort is normal.  Abdominal:     Comments: Gravid abdomen, nontender  Musculoskeletal:        General: Normal range of motion.     Cervical back: Normal range of motion and neck supple.  Skin:    General: Skin is warm and dry.    Neurological:     General: No focal deficit present.     Mental Status: She is alert and oriented to person, place, and time.  Psychiatric:        Mood and Affect: Mood normal.        Behavior: Behavior normal.        Thought Content: Thought content normal.        Judgment: Judgment normal.      UC Treatments / Results  Labs (all labs ordered are listed, but only abnormal results are displayed) Labs Reviewed  SARS CORONAVIRUS 2 (TAT 6-24 HRS)    EKG   Radiology No results found.  Procedures Procedures (including critical care time)  Medications Ordered in UC Medications - No data to display  Initial Impression / Assessment and Plan / UC Course  I have reviewed the triage vital signs and the nursing notes.  Pertinent labs & imaging results that were available during my care of the patient were reviewed by me and considered in my medical decision making (see chart for details).  Patient instructed to call her obstetrician about having been seen here and that we are ruling out COVID-19.   Final Clinical Impressions(s) / UC Diagnoses   Final diagnoses:  Nausea and vomiting, intractability of vomiting not specified, unspecified vomiting type     Discharge Instructions     Avoid fried food or dairy for the next two days    ED Prescriptions    Medication Sig Dispense Auth. Provider   ondansetron (ZOFRAN-ODT) 8 MG disintegrating tablet Take 1 tablet (8 mg total) by mouth every 8 (eight) hours as needed for nausea. 12 tablet Robyn Haber, MD     PDMP not reviewed this encounter.   Robyn Haber, MD 05/17/19 1023

## 2019-05-18 ENCOUNTER — Other Ambulatory Visit: Payer: Self-pay

## 2019-05-18 ENCOUNTER — Emergency Department (HOSPITAL_COMMUNITY)
Admission: EM | Admit: 2019-05-18 | Discharge: 2019-05-18 | Disposition: A | Discharge status: Home / Self Care | Payer: Managed Care, Other (non HMO) | Attending: Emergency Medicine | Admitting: Emergency Medicine

## 2019-05-18 ENCOUNTER — Encounter (HOSPITAL_COMMUNITY): Payer: Self-pay | Admitting: Emergency Medicine

## 2019-05-18 DIAGNOSIS — Z3A30 30 weeks gestation of pregnancy: Secondary | ICD-10-CM | POA: Insufficient documentation

## 2019-05-18 DIAGNOSIS — O98513 Other viral diseases complicating pregnancy, third trimester: Secondary | ICD-10-CM | POA: Insufficient documentation

## 2019-05-18 DIAGNOSIS — Z5321 Procedure and treatment not carried out due to patient leaving prior to being seen by health care provider: Secondary | ICD-10-CM | POA: Insufficient documentation

## 2019-05-18 DIAGNOSIS — U071 COVID-19: Secondary | ICD-10-CM | POA: Insufficient documentation

## 2019-05-18 HISTORY — DX: COVID-19: U07.1

## 2019-05-18 LAB — CBC WITH DIFFERENTIAL/PLATELET
Abs Immature Granulocytes: 0.04 10*3/uL (ref 0.00–0.07)
Basophils Absolute: 0 10*3/uL (ref 0.0–0.1)
Basophils Relative: 0 %
Eosinophils Absolute: 0 10*3/uL (ref 0.0–0.5)
Eosinophils Relative: 1 %
HCT: 35.2 % — ABNORMAL LOW (ref 36.0–46.0)
Hemoglobin: 11.1 g/dL — ABNORMAL LOW (ref 12.0–15.0)
Immature Granulocytes: 1 %
Lymphocytes Relative: 26 %
Lymphs Abs: 1.6 10*3/uL (ref 0.7–4.0)
MCH: 28 pg (ref 26.0–34.0)
MCHC: 31.5 g/dL (ref 30.0–36.0)
MCV: 88.9 fL (ref 80.0–100.0)
Monocytes Absolute: 0.6 10*3/uL (ref 0.1–1.0)
Monocytes Relative: 11 %
Neutro Abs: 3.7 10*3/uL (ref 1.7–7.7)
Neutrophils Relative %: 61 %
Platelets: 205 10*3/uL (ref 150–400)
RBC: 3.96 MIL/uL (ref 3.87–5.11)
RDW: 13.2 % (ref 11.5–15.5)
WBC: 6 10*3/uL (ref 4.0–10.5)
nRBC: 0 % (ref 0.0–0.2)

## 2019-05-18 LAB — COMPREHENSIVE METABOLIC PANEL
ALT: 23 U/L (ref 0–44)
AST: 35 U/L (ref 15–41)
Albumin: 3.1 g/dL — ABNORMAL LOW (ref 3.5–5.0)
Alkaline Phosphatase: 99 U/L (ref 38–126)
Anion gap: 20 — ABNORMAL HIGH (ref 5–15)
BUN: <5 mg/dL — ABNORMAL LOW (ref 6–20)
CO2: 12 mmol/L — ABNORMAL LOW (ref 22–32)
Calcium: 9.3 mg/dL (ref 8.9–10.3)
Chloride: 105 mmol/L (ref 98–111)
Creatinine, Ser: 0.58 mg/dL (ref 0.44–1.00)
GFR calc Af Amer: >60 mL/min (ref >60)
GFR calc non Af Amer: >60 mL/min (ref >60)
Glucose, Bld: 118 mg/dL — ABNORMAL HIGH (ref 70–99)
Potassium: 3.6 mmol/L (ref 3.5–5.1)
Sodium: 137 mmol/L (ref 135–145)
Total Bilirubin: 0.6 mg/dL (ref 0.3–1.2)
Total Protein: 6.6 g/dL (ref 6.5–8.1)

## 2019-05-18 LAB — SARS CORONAVIRUS 2 (TAT 6-24 HRS): SARS Coronavirus 2: POSITIVE — AB

## 2019-05-18 NOTE — ED Notes (Signed)
Pt states that she wants to leave, this NT explained this is leaving AMA, pt stated understanding.

## 2019-05-18 NOTE — ED Triage Notes (Signed)
Pt st's she was tested + for covid earlier today.  Tonight is having pain in upper back when she coughs.  Shortness of breath off and on.  Pt also c/o nausea and vomiting

## 2019-05-21 ENCOUNTER — Other Ambulatory Visit: Payer: Self-pay

## 2019-05-21 ENCOUNTER — Encounter (HOSPITAL_COMMUNITY): Payer: Self-pay | Admitting: Emergency Medicine

## 2019-05-21 ENCOUNTER — Emergency Department (HOSPITAL_COMMUNITY)
Admission: EM | Admit: 2019-05-21 | Discharge: 2019-05-22 | Disposition: A | Discharge status: Home / Self Care | Payer: Managed Care, Other (non HMO) | Attending: Emergency Medicine | Admitting: Emergency Medicine

## 2019-05-21 DIAGNOSIS — O98513 Other viral diseases complicating pregnancy, third trimester: Secondary | ICD-10-CM | POA: Insufficient documentation

## 2019-05-21 DIAGNOSIS — U071 COVID-19: Secondary | ICD-10-CM | POA: Insufficient documentation

## 2019-05-21 DIAGNOSIS — Z87891 Personal history of nicotine dependence: Secondary | ICD-10-CM | POA: Diagnosis not present

## 2019-05-21 DIAGNOSIS — Z3A30 30 weeks gestation of pregnancy: Secondary | ICD-10-CM | POA: Diagnosis not present

## 2019-05-21 DIAGNOSIS — O212 Late vomiting of pregnancy: Secondary | ICD-10-CM | POA: Insufficient documentation

## 2019-05-21 DIAGNOSIS — R112 Nausea with vomiting, unspecified: Secondary | ICD-10-CM

## 2019-05-21 MED ORDER — ACETAMINOPHEN 500 MG PO TABS
1000.0000 mg | ORAL_TABLET | Freq: Once | ORAL | Status: AC
  Administered 2019-05-21: 1000 mg via ORAL
  Filled 2019-05-21: qty 2

## 2019-05-21 MED ORDER — ACETAMINOPHEN 500 MG PO TABS: 1000.0000 mg | ORAL_TABLET | Freq: Once | ORAL | Status: DC

## 2019-05-21 NOTE — ED Triage Notes (Signed)
Pt was seen here yesterday for same, tasted positive for covid 4/28, [redacted] weeks pregnant, having abd pain and increase SOB. No vaginal discharge or bleeding.

## 2019-05-21 NOTE — ED Notes (Signed)
Triage nurse made aware of pt's heart rate and temperature.

## 2019-05-22 ENCOUNTER — Emergency Department (HOSPITAL_COMMUNITY): Payer: Managed Care, Other (non HMO)

## 2019-05-22 ENCOUNTER — Encounter (HOSPITAL_COMMUNITY): Payer: Self-pay

## 2019-05-22 DIAGNOSIS — O98513 Other viral diseases complicating pregnancy, third trimester: Secondary | ICD-10-CM | POA: Diagnosis not present

## 2019-05-22 LAB — URINALYSIS, ROUTINE W REFLEX MICROSCOPIC
Bilirubin Urine: NEGATIVE
Glucose, UA: 50 mg/dL — AB
Hgb urine dipstick: NEGATIVE
Ketones, ur: 80 mg/dL — AB
Nitrite: NEGATIVE
Protein, ur: >=300 mg/dL — AB
Specific Gravity, Urine: 1.029 (ref 1.005–1.030)
Squamous Epithelial / HPF: >50 — ABNORMAL HIGH (ref 0–5)
pH: 5 (ref 5.0–8.0)

## 2019-05-22 LAB — COMPREHENSIVE METABOLIC PANEL
ALT: 17 U/L (ref 0–44)
ALT: 15 U/L (ref 0–44)
AST: 20 U/L (ref 15–41)
AST: 20 U/L (ref 15–41)
Albumin: 3.4 g/dL — ABNORMAL LOW (ref 3.5–5.0)
Albumin: 2.8 g/dL — ABNORMAL LOW (ref 3.5–5.0)
Alkaline Phosphatase: 109 U/L (ref 38–126)
Alkaline Phosphatase: 91 U/L (ref 38–126)
Anion gap: 16 — ABNORMAL HIGH (ref 5–15)
Anion gap: 15 (ref 5–15)
BUN: 5 mg/dL — ABNORMAL LOW (ref 6–20)
BUN: 5 mg/dL — ABNORMAL LOW (ref 6–20)
CO2: 15 mmol/L — ABNORMAL LOW (ref 22–32)
CO2: 13 mmol/L — ABNORMAL LOW (ref 22–32)
Calcium: 10 mg/dL (ref 8.9–10.3)
Calcium: 9.1 mg/dL (ref 8.9–10.3)
Chloride: 105 mmol/L (ref 98–111)
Chloride: 108 mmol/L (ref 98–111)
Creatinine, Ser: 0.84 mg/dL (ref 0.44–1.00)
Creatinine, Ser: 0.71 mg/dL (ref 0.44–1.00)
GFR calc Af Amer: >60 mL/min (ref >60)
GFR calc Af Amer: >60 mL/min (ref >60)
GFR calc non Af Amer: >60 mL/min (ref >60)
GFR calc non Af Amer: >60 mL/min (ref >60)
Glucose, Bld: 127 mg/dL — ABNORMAL HIGH (ref 70–99)
Glucose, Bld: 153 mg/dL — ABNORMAL HIGH (ref 70–99)
Potassium: 4.1 mmol/L (ref 3.5–5.1)
Potassium: 3.5 mmol/L (ref 3.5–5.1)
Sodium: 136 mmol/L (ref 135–145)
Sodium: 136 mmol/L (ref 135–145)
Total Bilirubin: 1.1 mg/dL (ref 0.3–1.2)
Total Bilirubin: 0.9 mg/dL (ref 0.3–1.2)
Total Protein: 7.3 g/dL (ref 6.5–8.1)
Total Protein: 6.2 g/dL — ABNORMAL LOW (ref 6.5–8.1)

## 2019-05-22 LAB — CBC WITH DIFFERENTIAL/PLATELET
Abs Immature Granulocytes: 0.07 10*3/uL (ref 0.00–0.07)
Basophils Absolute: 0 10*3/uL (ref 0.0–0.1)
Basophils Relative: 0 %
Eosinophils Absolute: 0 10*3/uL (ref 0.0–0.5)
Eosinophils Relative: 0 %
HCT: 39.6 % (ref 36.0–46.0)
Hemoglobin: 12.6 g/dL (ref 12.0–15.0)
Immature Granulocytes: 1 %
Lymphocytes Relative: 19 %
Lymphs Abs: 1.3 10*3/uL (ref 0.7–4.0)
MCH: 28.2 pg (ref 26.0–34.0)
MCHC: 31.8 g/dL (ref 30.0–36.0)
MCV: 88.6 fL (ref 80.0–100.0)
Monocytes Absolute: 0.5 10*3/uL (ref 0.1–1.0)
Monocytes Relative: 8 %
Neutro Abs: 4.7 10*3/uL (ref 1.7–7.7)
Neutrophils Relative %: 72 %
Platelets: 185 10*3/uL (ref 150–400)
RBC: 4.47 MIL/uL (ref 3.87–5.11)
RDW: 13.2 % (ref 11.5–15.5)
WBC: 6.6 10*3/uL (ref 4.0–10.5)
nRBC: 0 % (ref 0.0–0.2)

## 2019-05-22 LAB — CBG MONITORING, ED: Glucose-Capillary: 119 mg/dL — ABNORMAL HIGH (ref 70–99)

## 2019-05-22 LAB — LACTIC ACID, PLASMA: Lactic Acid, Venous: 1.7 mmol/L (ref 0.5–1.9)

## 2019-05-22 MED ORDER — ALUM & MAG HYDROXIDE-SIMETH 200-200-20 MG/5ML PO SUSP
30.0000 mL | Freq: Once | ORAL | Status: AC
  Administered 2019-05-22: 30 mL via ORAL
  Filled 2019-05-22: qty 30

## 2019-05-22 MED ORDER — ONDANSETRON HCL 4 MG/2ML IJ SOLN
4.0000 mg | Freq: Once | INTRAMUSCULAR | Status: AC
  Administered 2019-05-22: 4 mg via INTRAVENOUS
  Filled 2019-05-22: qty 2

## 2019-05-22 MED ORDER — LACTATED RINGERS IV BOLUS
1000.0000 mL | Freq: Once | INTRAVENOUS | Status: AC
  Administered 2019-05-22: 1000 mL via INTRAVENOUS

## 2019-05-22 NOTE — Progress Notes (Signed)
0144: OBRRN called to [facility] for pt c/o increased SOB and abdominal pain. [redacted] wks GA. G1P0. Pt tachycardic on monitor but VSS otherwise.   0200: OBRRN at bedside, chest x-ray being performed.  0215: Pt placed on monitor.  0225: CBG 119  0233: Dr. Elon Spanner called and notified of pt c/o increased SOB and abdominal pain. [redacted] wks GA. G1P0. Pt tachycardic but VSS otherwise. FHR reactive at 150 bpm 15x15, one variable. No ctx present. No bleeding. No LOF, Fetal movement decreased but present. OB cleared at this time.  0235: Pt removed from monitor at this time.

## 2019-05-22 NOTE — Discharge Instructions (Addendum)
Continue to push good oral hydration.  Gentle diet and progress as tolerated. Stay quarantined at home until 05/28/19 as directed. Call your OB-GYN if any ongoing issues. Return here for any new/acute changes.

## 2019-05-22 NOTE — ED Notes (Signed)
Pt reports no emesis or feelings of nausea/sickness since drinking her ginger ale at 0500.

## 2019-05-22 NOTE — ED Notes (Signed)
Patient seen walking out ten minutes prior to name being moved into a room. Called for room x1 with no response

## 2019-05-22 NOTE — ED Notes (Signed)
Called [facility]  

## 2019-05-22 NOTE — ED Provider Notes (Signed)
[facility] EMERGENCY DEPARTMENT Provider Note   CSN: [id number] Arrival date & time: 05/21/19  2016     History Chief Complaint  Patient presents with  . Covid    Kelli Mcpherson is a 24 y.o. female.  The history is provided by the patient and medical records.   24 year old female G1, P0 approximately [redacted] weeks gestation here with cough, intermittent shortness of breath, and some lower abdominal pain.  She was diagnosed with Covid on 05/18/2019.  States of the past few days she has had persistent cough pretty much all day every day.  States she mostly has shortness of breath at night when lying flat to sleep.  She denies chest pain, palpitations, dizziness, or weakness. She also reports ongoing nausea and vomiting.  She has tried to switch to a bland diet but has not had any relief.  She is not even able to hold down water throughout the day today.  She also reports some sharp, stabbing lower abdominal pain.  She has not had any vaginal bleeding or loss of fluids.  States she does not feel like she is having any contractions.  She does have history of gestational diabetes.  She is followed by Dr. Royston Sinner.  Past Medical History:  Diagnosis Date  . Chlamydia   . COVID-19   . Eczema   . Gonorrhea     Patient Active Problem List   Diagnosis Date Noted  . Vaginal discharge 02/04/2018  . Costochondral chest pain 10/13/2017  . Episodic tension-type headache, not intractable 10/13/2017  . Obesity 08/01/2015    Past Surgical History:  Procedure Laterality Date  . LYMPHADENECTOMY       OB History    Gravida  1   Para      Term      Preterm      AB      Living        SAB      TAB      Ectopic      Multiple      Live Births              Family History  Problem Relation Age of Onset  . Diabetes Mother   . Hypertension Mother   . Healthy Father     Social History   Tobacco Use  . Smoking status: Former Research scientist (life sciences)  . Smokeless tobacco:  Former Systems developer  . Tobacco comment: patient currently does not smoke   Substance Use Topics  . Alcohol use: Not Currently    Comment: socially  . Drug use: Not Currently    Types: "Crack" cocaine    Home Medications Prior to Admission medications   Medication Sig Start Date End Date Taking? Authorizing Provider  ibuprofen (ADVIL) 800 MG tablet Take 1 tablet (800 mg total) by mouth 3 (three) times daily with meals. 08/07/18   Vanessa Kick, MD  ondansetron (ZOFRAN-ODT) 8 MG disintegrating tablet Take 1 tablet (8 mg total) by mouth every 8 (eight) hours as needed for nausea. 05/17/19   Robyn Haber, MD    Allergies    Patient has no known allergies.  Review of Systems   Review of Systems  Respiratory: Positive for cough.   Gastrointestinal: Positive for abdominal pain.  All other systems reviewed and are negative.   Physical Exam Updated Vital Signs BP 130/85 (BP Location: Right Arm)   Pulse (!) 114   Temp 98.7 F (37.1 C) (Oral)   Resp (!) 22  Ht 6' (1.829 m)   Wt (!) 136.5 kg   LMP 07/31/2018 (Approximate)   SpO2 97%   BMI 40.81 kg/m   Physical Exam Vitals and nursing note reviewed.  Constitutional:      Appearance: She is well-developed.  HENT:     Head: Normocephalic and atraumatic.  Eyes:     Conjunctiva/sclera: Conjunctivae normal.     Pupils: Pupils are equal, round, and reactive to light.  Cardiovascular:     Rate and Rhythm: Regular rhythm. Tachycardia present.     Heart sounds: Normal heart sounds.     Comments: Low grade tachy around 105-110 during exam Pulmonary:     Effort: Pulmonary effort is normal.     Breath sounds: Normal breath sounds. No stridor. No wheezing or rhonchi.     Comments: NAD, able to speak in full sentences without difficulty, O2 sats high 90's on RA Abdominal:     General: Bowel sounds are normal.     Palpations: Abdomen is soft.     Tenderness: There is no abdominal tenderness.     Comments: Gravid abdomen, no elicited  tenderness  Musculoskeletal:        General: Normal range of motion.     Cervical back: Normal range of motion.  Skin:    General: Skin is warm and dry.  Neurological:     Mental Status: She is alert and oriented to person, place, and time.     ED Results / Procedures / Treatments   Labs (all labs ordered are listed, but only abnormal results are displayed) Labs Reviewed  COMPREHENSIVE METABOLIC PANEL - Abnormal; Notable for the following components:      Result Value   CO2 15 (*)    Glucose, Bld 127 (*)    BUN 5 (*)    Albumin 3.4 (*)    Anion gap 16 (*)    All other components within normal limits  URINALYSIS, ROUTINE W REFLEX MICROSCOPIC - Abnormal; Notable for the following components:   Color, Urine AMBER (*)    APPearance CLOUDY (*)    Glucose, UA 50 (*)    Ketones, ur 80 (*)    Protein, ur >=300 (*)    Leukocytes,Ua TRACE (*)    Bacteria, UA MANY (*)    Squamous Epithelial / LPF >50 (*)    Non Squamous Epithelial 0-5 (*)    All other components within normal limits  COMPREHENSIVE METABOLIC PANEL - Abnormal; Notable for the following components:   CO2 13 (*)    Glucose, Bld 153 (*)    BUN 5 (*)    Total Protein 6.2 (*)    Albumin 2.8 (*)    All other components within normal limits  CBG MONITORING, ED - Abnormal; Notable for the following components:   Glucose-Capillary 119 (*)    All other components within normal limits  CBC WITH DIFFERENTIAL/PLATELET  LACTIC ACID, PLASMA    EKG None  Radiology DG Chest Port 1 View  Result Date: 05/22/2019 CLINICAL DATA:  Shortness of breath.  COVID-19. EXAM: PORTABLE CHEST 1 VIEW COMPARISON:  08/07/2018 FINDINGS: The heart size and mediastinal contours are within normal limits. Both lungs are clear. The visualized skeletal structures are unremarkable. IMPRESSION: Clear lungs. Electronically Signed   By: Deatra Robinson M.D.   On: 05/22/2019 02:41    Procedures Procedures (including critical care time)  Medications  Ordered in ED Medications  acetaminophen (TYLENOL) tablet 1,000 mg (1,000 mg Oral Given 05/21/19 2107)  lactated  ringers bolus 1,000 mL (0 mLs Intravenous Stopped 05/22/19 0416)  ondansetron (ZOFRAN) injection 4 mg (4 mg Intravenous Given 05/22/19 0230)  lactated ringers bolus 1,000 mL (0 mLs Intravenous Stopped 05/22/19 0548)  alum & mag hydroxide-simeth (MAALOX/MYLANTA) 200-200-20 MG/5ML suspension 30 mL (30 mLs Oral Given 05/22/19 0554)    ED Course  I have reviewed the triage vital signs and the nursing notes.  Pertinent labs & imaging results that were available during my care of the patient were reviewed by me and considered in my medical decision making (see chart for details).    MDM Rules/Calculators/A&P  24 y.o. F here with cough, SOB at night, nausea, vomiting, and lower abdominal pain.  She is G1P0 approx [redacted] weeks gestation.  No vaginal bleeding or loss of fluid.  Diagnosed covid + on 05/18/19.  She was initially febrile but improved with tylenol by time of my evaluation.  She is tachycardic but in NAD.  She does appear clinically dry.  Will start liter of LR, zofran. Labs pending.  Will page [facility] given advanced pregnancy.  [facility] has evaluated patient and cleared from their standpoint.  Labs with low CO2 of 15, normal renal function.  No significant hyperglycemia.  Mildly elevated anion gap of 16.  This is likely due to dehydration given her ongoing nausea and vomiting.  Her UA appears contaminated with large squamous, ketones, and protein-- this also likely due to dehydration.  Her blood pressure has remained normal along with LFTs.  Does not appear to have any signs of preeclampsia, HELLP syndrome, etc.  Patient was aggressively hydrated here with 2 L of LR.   Heart rate has steadily been improving, now around 105.  Remains without chest pain.  Low suspicion for PE.  She is tolerating oral fluids and medications at this time and seems to be feeling much better.  We will repeat her  CMP.  6:55 AM Patient's repeat CMP with actually worsening CO2 but anion gap is now normal.  Patient continues feeling better, has been asking RN repeatedly to leave. Her HR continues to remain better controlled. I have discussed with her the noted electrolyte derangement on her labs today which we would usually consider admission for continued IV hydration, however patient wants to go home.  Patient is aware her symptoms may worsen but wants to try OP management.  She will need to continue to push oral hydration at home, gentle diet, and progress as tolerated.  She will call her OB-GYN on Monday for follow-up.  She was given strict return precautions for any new/acute changes.  Case discussed with attending physician, Dr. Elesa Massed, who agrees with assessment and plan of care.  Final Clinical Impression(s) / ED Diagnoses Final diagnoses:  COVID-19  Non-intractable vomiting with nausea, unspecified vomiting type    Rx / DC Orders ED Discharge Orders    None       Garlon Hatchet, PA-C 05/22/19 1660    Ward, Layla Maw, DO 05/22/19 [id number]

## 2019-05-22 NOTE — ED Notes (Signed)
Ginger ale given for fluid challenge 

## 2019-05-24 ENCOUNTER — Encounter (HOSPITAL_COMMUNITY): Payer: Self-pay | Admitting: Emergency Medicine

## 2019-05-24 ENCOUNTER — Emergency Department (HOSPITAL_COMMUNITY)
Admission: EM | Admit: 2019-05-24 | Discharge: 2019-05-24 | Disposition: A | Discharge status: Home / Self Care | Payer: Managed Care, Other (non HMO) | Attending: Emergency Medicine | Admitting: Emergency Medicine

## 2019-05-24 DIAGNOSIS — R509 Fever, unspecified: Secondary | ICD-10-CM | POA: Insufficient documentation

## 2019-05-24 DIAGNOSIS — U071 COVID-19: Secondary | ICD-10-CM | POA: Insufficient documentation

## 2019-05-24 DIAGNOSIS — R112 Nausea with vomiting, unspecified: Secondary | ICD-10-CM | POA: Diagnosis not present

## 2019-05-24 DIAGNOSIS — Z3A31 31 weeks gestation of pregnancy: Secondary | ICD-10-CM | POA: Diagnosis not present

## 2019-05-24 DIAGNOSIS — Z87891 Personal history of nicotine dependence: Secondary | ICD-10-CM | POA: Diagnosis not present

## 2019-05-24 DIAGNOSIS — O26893 Other specified pregnancy related conditions, third trimester: Secondary | ICD-10-CM | POA: Diagnosis not present

## 2019-05-24 LAB — URINALYSIS, ROUTINE W REFLEX MICROSCOPIC
Bilirubin Urine: NEGATIVE
Glucose, UA: 50 mg/dL — AB
Hgb urine dipstick: NEGATIVE
Ketones, ur: 80 mg/dL — AB
Leukocytes,Ua: NEGATIVE
Nitrite: NEGATIVE
Protein, ur: 100 mg/dL — AB
Specific Gravity, Urine: 1.024 (ref 1.005–1.030)
pH: 5 (ref 5.0–8.0)

## 2019-05-24 LAB — COMPREHENSIVE METABOLIC PANEL
ALT: 17 U/L (ref 0–44)
AST: 26 U/L (ref 15–41)
Albumin: 3.2 g/dL — ABNORMAL LOW (ref 3.5–5.0)
Alkaline Phosphatase: 110 U/L (ref 38–126)
Anion gap: 17 — ABNORMAL HIGH (ref 5–15)
BUN: <5 mg/dL — ABNORMAL LOW (ref 6–20)
CO2: 8 mmol/L — ABNORMAL LOW (ref 22–32)
Calcium: 10 mg/dL (ref 8.9–10.3)
Chloride: 112 mmol/L — ABNORMAL HIGH (ref 98–111)
Creatinine, Ser: 1 mg/dL (ref 0.44–1.00)
GFR calc Af Amer: >60 mL/min (ref >60)
GFR calc non Af Amer: >60 mL/min (ref >60)
Glucose, Bld: 155 mg/dL — ABNORMAL HIGH (ref 70–99)
Potassium: 3.9 mmol/L (ref 3.5–5.1)
Sodium: 137 mmol/L (ref 135–145)
Total Bilirubin: 0.8 mg/dL (ref 0.3–1.2)
Total Protein: 7.4 g/dL (ref 6.5–8.1)

## 2019-05-24 LAB — CBC WITH DIFFERENTIAL/PLATELET
Abs Immature Granulocytes: 0.12 10*3/uL — ABNORMAL HIGH (ref 0.00–0.07)
Basophils Absolute: 0 10*3/uL (ref 0.0–0.1)
Basophils Relative: 0 %
Eosinophils Absolute: 0 10*3/uL (ref 0.0–0.5)
Eosinophils Relative: 0 %
HCT: 41.7 % (ref 36.0–46.0)
Hemoglobin: 13 g/dL (ref 12.0–15.0)
Immature Granulocytes: 2 %
Lymphocytes Relative: 11 %
Lymphs Abs: 0.8 10*3/uL (ref 0.7–4.0)
MCH: 28.2 pg (ref 26.0–34.0)
MCHC: 31.2 g/dL (ref 30.0–36.0)
MCV: 90.5 fL (ref 80.0–100.0)
Monocytes Absolute: 0.5 10*3/uL (ref 0.1–1.0)
Monocytes Relative: 6 %
Neutro Abs: 6.2 10*3/uL (ref 1.7–7.7)
Neutrophils Relative %: 81 %
Platelets: 202 10*3/uL (ref 150–400)
RBC: 4.61 MIL/uL (ref 3.87–5.11)
RDW: 13.6 % (ref 11.5–15.5)
WBC: 7.7 10*3/uL (ref 4.0–10.5)
nRBC: 0 % (ref 0.0–0.2)

## 2019-05-24 LAB — LACTIC ACID, PLASMA: Lactic Acid, Venous: 1.2 mmol/L (ref 0.5–1.9)

## 2019-05-24 LAB — LIPASE, BLOOD: Lipase: 29 U/L (ref 11–51)

## 2019-05-24 MED ORDER — ONDANSETRON HCL 4 MG/2ML IJ SOLN: 4.0000 mg | Freq: Once | INTRAMUSCULAR | Status: DC

## 2019-05-24 MED ORDER — LACTATED RINGERS IV BOLUS
1000.0000 mL | Freq: Once | INTRAVENOUS | Status: AC
  Administered 2019-05-24: 19:00:00 1000 mL via INTRAVENOUS

## 2019-05-24 MED ORDER — METOCLOPRAMIDE HCL 5 MG/ML IJ SOLN
10.0000 mg | INTRAMUSCULAR | Status: AC
  Administered 2019-05-24: 10 mg via INTRAVENOUS
  Filled 2019-05-24: qty 2

## 2019-05-24 MED ORDER — PROMETHAZINE HCL 25 MG PO TABS: 25.0000 mg | ORAL_TABLET | Freq: Three times a day (TID) | ORAL | 0 refills | Status: DC | PRN

## 2019-05-24 MED ORDER — ACETAMINOPHEN 500 MG PO TABS: 1000.0000 mg | ORAL_TABLET | Freq: Once | ORAL | Status: DC

## 2019-05-24 MED ORDER — ACETAMINOPHEN 500 MG PO TABS
1000.0000 mg | ORAL_TABLET | Freq: Once | ORAL | Status: AC
  Administered 2019-05-24: 1000 mg via ORAL
  Filled 2019-05-24: qty 2

## 2019-05-24 NOTE — ED Notes (Signed)
Brought bedside commode to room, pt able to ambulate w/o assistance, no dizziness or instability noted.

## 2019-05-24 NOTE — ED Provider Notes (Addendum)
[facility] EMERGENCY DEPARTMENT Provider Note   CSN: [id number] Arrival date & time: 05/24/19  1614     History Chief Complaint  Patient presents with  . Fever    Kelli Mcpherson is a 24 y.o. female.  24yo F G1P0 at [redacted] weeks gestation w/ h/o obesity who p/w nausea and vomiting.  Patient tested positive for COVID-19 on 4/28, developed symptoms just before getting tested.  She has had cough, sore throat, and nausea/vomiting.  She came to the ED on 5/2 for nausea and vomiting, was treated and discharged home with Zofran.  She states that the vomiting has continued and she is not able to keep anything down.  Zofran has not helped, last dose was 8 AM this morning.  She denies any diarrhea.  She continues to have cough but notes that her previous shortness of breath has gotten better.  She denies any abdominal pain, vaginal bleeding/passage of fluid, or urinary symptoms.  The history is provided by the patient.  Fever      Past Medical History:  Diagnosis Date  . Chlamydia   . COVID-19   . Eczema   . Gonorrhea     Patient Active Problem List   Diagnosis Date Noted  . Vaginal discharge 02/04/2018  . Costochondral chest pain 10/13/2017  . Episodic tension-type headache, not intractable 10/13/2017  . Obesity 08/01/2015    Past Surgical History:  Procedure Laterality Date  . LYMPHADENECTOMY       OB History    Gravida  1   Para      Term      Preterm      AB      Living        SAB      TAB      Ectopic      Multiple      Live Births              Family History  Problem Relation Age of Onset  . Diabetes Mother   . Hypertension Mother   . Healthy Father     Social Hx: Denies tobacco or drug use Occasional alcohol use  Home Medications Prior to Admission medications   Medication Sig Start Date End Date Taking? Authorizing Provider  metFORMIN (GLUCOPHAGE-XR) 750 MG 24 hr tablet Take 750 mg by mouth 2 (two) times daily.  05/06/19  Yes [provider]  ondansetron (ZOFRAN-ODT) 8 MG disintegrating tablet Take 1 tablet (8 mg total) by mouth every 8 (eight) hours as needed for nausea. Patient taking differently: Take 8 mg by mouth every 8 (eight) hours as needed for nausea (DISSOLVE ORALLY).  05/17/19  Yes Robyn Haber, MD  Prenatal Vit-Fe Phos-FA-Omega (VITAFOL GUMMIES) 3.33-0.333-34.8 MG CHEW Chew 3 each by mouth daily. 04/13/19  Yes [provider]  triamcinolone cream (KENALOG) 0.1 % Apply 1 application topically 2 (two) times daily as needed (for itching).   Yes [provider]  ibuprofen (ADVIL) 800 MG tablet Take 1 tablet (800 mg total) by mouth 3 (three) times daily with meals. Patient not taking: Reported on 05/24/2019 08/07/18   Vanessa Kick, MD  promethazine (PHENERGAN) 25 MG tablet Take 1 tablet (25 mg total) by mouth every 8 (eight) hours as needed for nausea or vomiting. 05/24/19   Jakeria Caissie, Wenda Overland, MD    Allergies    Patient has no known allergies.  Review of Systems   Review of Systems  Constitutional: Positive for fever.  All other systems reviewed and are negative except that which was mentioned in HPI  Physical Exam Updated Vital Signs BP 134/77   Pulse (!) 114   Temp (!) 101.1 F (38.4 C) (Oral)   Resp (!) 26   LMP 07/31/2018 (Approximate) Comment: pt shielded  SpO2 100%   Physical Exam Vitals and nursing note reviewed.  Constitutional:      General: She is not in acute distress.    Appearance: She is well-developed.  HENT:     Head: Normocephalic and atraumatic.  Eyes:     Conjunctiva/sclera: Conjunctivae normal.  Cardiovascular:     Rate and Rhythm: Regular rhythm. Tachycardia present.     Heart sounds: Normal heart sounds. No murmur.  Pulmonary:     Effort: Pulmonary effort is normal.     Breath sounds: Normal breath sounds.  Abdominal:     Palpations: Abdomen is soft.     Tenderness: There is no abdominal tenderness.     Comments: Uterus  palpable above umbilicus  Musculoskeletal:     Cervical back: Neck supple.  Skin:    General: Skin is warm and dry.  Neurological:     Mental Status: She is alert and oriented to person, place, and time.     Comments: Fluent speech  Psychiatric:     Comments: Depressed mood, flat affect, avoids eye contact     ED Results / Procedures / Treatments   Labs (all labs ordered are listed, but only abnormal results are displayed) Labs Reviewed  COMPREHENSIVE METABOLIC PANEL - Abnormal; Notable for the following components:      Result Value   Chloride 112 (*)    CO2 8 (*)    Glucose, Bld 155 (*)    BUN <5 (*)    Albumin 3.2 (*)    Anion gap 17 (*)    All other components within normal limits  CBC WITH DIFFERENTIAL/PLATELET - Abnormal; Notable for the following components:   Abs Immature Granulocytes 0.12 (*)    All other components within normal limits  URINALYSIS, ROUTINE W REFLEX MICROSCOPIC - Abnormal; Notable for the following components:   APPearance HAZY (*)    Glucose, UA 50 (*)    Ketones, ur 80 (*)    Protein, ur 100 (*)    Bacteria, UA FEW (*)    All other components within normal limits  URINE CULTURE  LIPASE, BLOOD  LACTIC ACID, PLASMA    EKG EKG Interpretation  Date/Time:  Tuesday May 24 2019 19:55:09 EDT Ventricular Rate:  114 PR Interval:    QRS Duration: 98 QT Interval:  336 QTC Calculation: 463 R Axis:   60 Text Interpretation: Sinus tachycardia Borderline Q waves in inferior leads Borderline T abnormalities, diffuse leads tachycardia and inferior T wave inversions new from tracing in 2012 Confirmed by Frederick Peers [phone]) on 05/24/2019 8:43:30 PM   Radiology No results found.  Procedures Procedures (including critical care time)  Medications Ordered in ED Medications  acetaminophen (TYLENOL) tablet 1,000 mg (1,000 mg Oral Given 05/24/19 1723)  lactated ringers bolus 1,000 mL (0 mLs Intravenous Stopped 05/24/19 2256)  metoCLOPramide (REGLAN)  injection 10 mg (10 mg Intravenous Given 05/24/19 1902)    ED Course  I have reviewed the triage vital signs and the nursing notes.  Pertinent labs that were available during my care of the patient were reviewed by me and considered in my medical decision making (see chart for details).    MDM Rules/Calculators/A&P  Pt non-toxic on exam, T 101.1, HR 121, remainder of VS normal. Abd non-tender. Denies any pregnancy related complaints. Gave IVF and reglan.   Lab work overall reassuring, creatinine normal, anion gap 17 with CO2 8 likely secondary to dehydration.  UA with some ketones and protein, no evidence of infection. She had similar lab abnormalities a few days ago with same symptoms and AG corrected with fluids alone therefore I do not feel this represents DKA. CBC normal.  On reassessment, she was drinking fluids and stated that her nausea had improved.  She has had no vomiting here and feels comfortable going home.  I have counseled on supportive measures for COVID-19 and encouraged to take Tylenol as needed for fevers.  Instructed to follow closely with her OB/GYN and I have extensively reviewed return precautions.  Kelli Mcpherson was evaluated in Emergency Department on 05/24/2019 for the symptoms described in the history of present illness. She was evaluated in the context of the global COVID-19 pandemic, which necessitated consideration that the patient might be at risk for infection with the SARS-CoV-2 virus that causes COVID-19. Institutional protocols and algorithms that pertain to the evaluation of patients at risk for COVID-19 are in a state of rapid change based on information released by regulatory bodies including the CDC and federal and state organizations. These policies and algorithms were followed during the patient's care in the ED.  Final Clinical Impression(s) / ED Diagnoses Final diagnoses:  COVID-19 virus infection  Non-intractable vomiting with  nausea, unspecified vomiting type    Rx / DC Orders ED Discharge Orders         Ordered    promethazine (PHENERGAN) 25 MG tablet  Every 8 hours PRN     05/24/19 2325           Shanedra Lave, Ambrose Finland, MD 05/24/19 2328    Clarene Duke, Ambrose Finland, MD 06/23/19 [phone]

## 2019-05-24 NOTE — ED Triage Notes (Signed)
Pt is covid + as on April 27th and here today for dehydration states unable to keep fluids down.

## 2019-05-24 NOTE — ED Notes (Signed)
Pt able to keep PO fluids down 

## 2019-05-24 NOTE — ED Notes (Signed)
Provider aware [facility] is not coming.

## 2019-05-24 NOTE — ED Notes (Signed)
[facility] notified.  ?

## 2019-05-25 LAB — URINE CULTURE: Culture: 30000 — AB

## 2019-05-26 ENCOUNTER — Telehealth: Payer: Self-pay | Admitting: *Deleted

## 2019-05-26 NOTE — Telephone Encounter (Signed)
Post ED Visit - Positive Culture Follow-up  Culture report reviewed by antimicrobial stewardship pharmacist: [facility] []  , Pharm.D. []  Enzo Bi, Pharm.D., BCPS AQ-ID []  , Pharm.D., BCPS []  Celedonio Miyamoto, .D., BCPS []  [place], .D., BCPS, AAHIVP []  Georgina Pillion, Pharm.D., BCPS, AAHIVP []  [address], PharmD, BCPS []  , PharmD, BCPS []  [place], PharmD, BCPS []  [place], PharmD []  , PharmD, BCPS []  Estella Husk, PharmD  Pharmacy Team []  Lysle Pearl, PharmD []  , PharmD []  Phillips Climes, PharmD []  , Rph []  Agapito Games) , PharmD []  Verlan Friends, PharmD []  , PharmD []  Mervyn Gay, PharmD []  , PharmD []  Vinnie Level, PharmD []  [facility], PharmD []  , PharmD []  Len Childs, PharmD   Positive urine culture, reviewed by , PA  and no further patient follow-up is required at this time.  Greer Pickerel [facility] 05/26/2019, 11:04 AM

## 2019-05-28 MED ORDER — GENERIC EXTERNAL MEDICATION
10.00 | Status: DC
Start: ? — End: 2019-05-28

## 2019-05-28 MED ORDER — GENERIC EXTERNAL MEDICATION
Status: DC
Start: ? — End: 2019-05-28

## 2019-05-28 MED ORDER — SODIUM CHLORIDE 0.9 % IV SOLN
10.00 | INTRAVENOUS | Status: DC
Start: ? — End: 2019-05-28

## 2019-05-29 ENCOUNTER — Encounter (HOSPITAL_COMMUNITY): Payer: Self-pay | Admitting: Internal Medicine

## 2019-05-29 ENCOUNTER — Inpatient Hospital Stay (HOSPITAL_COMMUNITY)
Admission: AD | Admit: 2019-05-29 | Discharge: 2019-06-11 | DRG: 805 | Disposition: A | Discharge status: Home / Self Care | Payer: Managed Care, Other (non HMO) | Source: Other Acute Inpatient Hospital | Attending: Obstetrics & Gynecology | Admitting: Obstetrics & Gynecology

## 2019-05-29 ENCOUNTER — Other Ambulatory Visit: Payer: Self-pay

## 2019-05-29 ENCOUNTER — Inpatient Hospital Stay (HOSPITAL_BASED_OUTPATIENT_CLINIC_OR_DEPARTMENT_OTHER): Payer: Managed Care, Other (non HMO)

## 2019-05-29 ENCOUNTER — Inpatient Hospital Stay (HOSPITAL_COMMUNITY): Payer: Managed Care, Other (non HMO)

## 2019-05-29 DIAGNOSIS — O9942 Diseases of the circulatory system complicating childbirth: Secondary | ICD-10-CM | POA: Diagnosis not present

## 2019-05-29 DIAGNOSIS — Z3A31 31 weeks gestation of pregnancy: Secondary | ICD-10-CM

## 2019-05-29 DIAGNOSIS — O99284 Endocrine, nutritional and metabolic diseases complicating childbirth: Secondary | ICD-10-CM | POA: Diagnosis present

## 2019-05-29 DIAGNOSIS — O9952 Diseases of the respiratory system complicating childbirth: Secondary | ICD-10-CM | POA: Diagnosis present

## 2019-05-29 DIAGNOSIS — E1110 Type 2 diabetes mellitus with ketoacidosis without coma: Secondary | ICD-10-CM

## 2019-05-29 DIAGNOSIS — O99214 Obesity complicating childbirth: Secondary | ICD-10-CM | POA: Diagnosis present

## 2019-05-29 DIAGNOSIS — O4103X0 Oligohydramnios, third trimester, not applicable or unspecified: Secondary | ICD-10-CM | POA: Diagnosis not present

## 2019-05-29 DIAGNOSIS — O99213 Obesity complicating pregnancy, third trimester: Secondary | ICD-10-CM

## 2019-05-29 DIAGNOSIS — O24425 Gestational diabetes mellitus in childbirth, controlled by oral hypoglycemic drugs: Secondary | ICD-10-CM | POA: Diagnosis present

## 2019-05-29 DIAGNOSIS — O163 Unspecified maternal hypertension, third trimester: Secondary | ICD-10-CM

## 2019-05-29 DIAGNOSIS — O114 Pre-existing hypertension with pre-eclampsia, complicating childbirth: Secondary | ICD-10-CM | POA: Diagnosis present

## 2019-05-29 DIAGNOSIS — E669 Obesity, unspecified: Secondary | ICD-10-CM

## 2019-05-29 DIAGNOSIS — O26893 Other specified pregnancy related conditions, third trimester: Secondary | ICD-10-CM | POA: Diagnosis not present

## 2019-05-29 DIAGNOSIS — E0810 Diabetes mellitus due to underlying condition with ketoacidosis without coma: Secondary | ICD-10-CM | POA: Diagnosis not present

## 2019-05-29 DIAGNOSIS — I472 Ventricular tachycardia: Secondary | ICD-10-CM | POA: Diagnosis present

## 2019-05-29 DIAGNOSIS — U071 COVID-19: Secondary | ICD-10-CM | POA: Diagnosis not present

## 2019-05-29 DIAGNOSIS — E1310 Other specified diabetes mellitus with ketoacidosis without coma: Secondary | ICD-10-CM | POA: Diagnosis not present

## 2019-05-29 DIAGNOSIS — O1002 Pre-existing essential hypertension complicating childbirth: Secondary | ICD-10-CM | POA: Diagnosis present

## 2019-05-29 DIAGNOSIS — Z87891 Personal history of nicotine dependence: Secondary | ICD-10-CM | POA: Diagnosis not present

## 2019-05-29 DIAGNOSIS — E0580 Other thyrotoxicosis without thyrotoxic crisis or storm: Secondary | ICD-10-CM | POA: Diagnosis present

## 2019-05-29 DIAGNOSIS — Z794 Long term (current) use of insulin: Secondary | ICD-10-CM | POA: Diagnosis not present

## 2019-05-29 DIAGNOSIS — E870 Hyperosmolality and hypernatremia: Secondary | ICD-10-CM | POA: Diagnosis present

## 2019-05-29 DIAGNOSIS — O98513 Other viral diseases complicating pregnancy, third trimester: Secondary | ICD-10-CM | POA: Diagnosis not present

## 2019-05-29 DIAGNOSIS — Z4589 Encounter for adjustment and management of other implanted devices: Secondary | ICD-10-CM | POA: Diagnosis not present

## 2019-05-29 DIAGNOSIS — Z3A32 32 weeks gestation of pregnancy: Secondary | ICD-10-CM

## 2019-05-29 DIAGNOSIS — R918 Other nonspecific abnormal finding of lung field: Secondary | ICD-10-CM | POA: Diagnosis not present

## 2019-05-29 DIAGNOSIS — Z452 Encounter for adjustment and management of vascular access device: Secondary | ICD-10-CM | POA: Diagnosis not present

## 2019-05-29 DIAGNOSIS — Z362 Encounter for other antenatal screening follow-up: Secondary | ICD-10-CM | POA: Diagnosis not present

## 2019-05-29 DIAGNOSIS — J9601 Acute respiratory failure with hypoxia: Secondary | ICD-10-CM | POA: Diagnosis not present

## 2019-05-29 DIAGNOSIS — R0602 Shortness of breath: Secondary | ICD-10-CM | POA: Diagnosis not present

## 2019-05-29 DIAGNOSIS — Z363 Encounter for antenatal screening for malformations: Secondary | ICD-10-CM | POA: Diagnosis not present

## 2019-05-29 DIAGNOSIS — J1282 Pneumonia due to coronavirus disease 2019: Secondary | ICD-10-CM | POA: Diagnosis present

## 2019-05-29 DIAGNOSIS — E872 Acidosis: Secondary | ICD-10-CM | POA: Diagnosis present

## 2019-05-29 DIAGNOSIS — O9852 Other viral diseases complicating childbirth: Principal | ICD-10-CM | POA: Diagnosis present

## 2019-05-29 DIAGNOSIS — O24415 Gestational diabetes mellitus in pregnancy, controlled by oral hypoglycemic drugs: Secondary | ICD-10-CM | POA: Diagnosis not present

## 2019-05-29 DIAGNOSIS — Z3A33 33 weeks gestation of pregnancy: Secondary | ICD-10-CM | POA: Diagnosis not present

## 2019-05-29 DIAGNOSIS — O24419 Gestational diabetes mellitus in pregnancy, unspecified control: Secondary | ICD-10-CM | POA: Diagnosis not present

## 2019-05-29 DIAGNOSIS — O4100X0 Oligohydramnios, unspecified trimester, not applicable or unspecified: Secondary | ICD-10-CM

## 2019-05-29 DIAGNOSIS — R05 Cough: Secondary | ICD-10-CM | POA: Diagnosis not present

## 2019-05-29 DIAGNOSIS — O1490 Unspecified pre-eclampsia, unspecified trimester: Secondary | ICD-10-CM | POA: Diagnosis present

## 2019-05-29 DIAGNOSIS — O1493 Unspecified pre-eclampsia, third trimester: Secondary | ICD-10-CM | POA: Diagnosis not present

## 2019-05-29 DIAGNOSIS — R110 Nausea: Secondary | ICD-10-CM | POA: Diagnosis not present

## 2019-05-29 DIAGNOSIS — I471 Supraventricular tachycardia: Secondary | ICD-10-CM | POA: Diagnosis not present

## 2019-05-29 DIAGNOSIS — Z95828 Presence of other vascular implants and grafts: Secondary | ICD-10-CM

## 2019-05-29 HISTORY — DX: Gestational diabetes mellitus in pregnancy, unspecified control: O24.419

## 2019-05-29 HISTORY — DX: Type 2 diabetes mellitus with ketoacidosis without coma: E11.10

## 2019-05-29 LAB — BASIC METABOLIC PANEL
Anion gap: 10 (ref 5–15)
Anion gap: 10 (ref 5–15)
BUN: 13 mg/dL (ref 6–20)
BUN: 12 mg/dL (ref 6–20)
CO2: 15 mmol/L — ABNORMAL LOW (ref 22–32)
CO2: 13 mmol/L — ABNORMAL LOW (ref 22–32)
Calcium: 10.1 mg/dL (ref 8.9–10.3)
Calcium: 10.2 mg/dL (ref 8.9–10.3)
Chloride: 123 mmol/L — ABNORMAL HIGH (ref 98–111)
Chloride: 125 mmol/L — ABNORMAL HIGH (ref 98–111)
Creatinine, Ser: 0.95 mg/dL (ref 0.44–1.00)
Creatinine, Ser: 0.92 mg/dL (ref 0.44–1.00)
GFR calc Af Amer: >60 mL/min (ref >60)
GFR calc Af Amer: >60 mL/min (ref >60)
GFR calc non Af Amer: >60 mL/min (ref >60)
GFR calc non Af Amer: >60 mL/min (ref >60)
Glucose, Bld: 233 mg/dL — ABNORMAL HIGH (ref 70–99)
Glucose, Bld: 188 mg/dL — ABNORMAL HIGH (ref 70–99)
Potassium: 3.6 mmol/L (ref 3.5–5.1)
Potassium: 3.2 mmol/L — ABNORMAL LOW (ref 3.5–5.1)
Sodium: 148 mmol/L — ABNORMAL HIGH (ref 135–145)
Sodium: 148 mmol/L — ABNORMAL HIGH (ref 135–145)

## 2019-05-29 LAB — CBC WITH DIFFERENTIAL/PLATELET
Abs Immature Granulocytes: 0.2 10*3/uL — ABNORMAL HIGH (ref 0.00–0.07)
Basophils Absolute: 0 10*3/uL (ref 0.0–0.1)
Basophils Relative: 0 %
Eosinophils Absolute: 0 10*3/uL (ref 0.0–0.5)
Eosinophils Relative: 0 %
HCT: 35.5 % — ABNORMAL LOW (ref 36.0–46.0)
Hemoglobin: 11.6 g/dL — ABNORMAL LOW (ref 12.0–15.0)
Immature Granulocytes: 4 %
Lymphocytes Relative: 14 %
Lymphs Abs: 0.8 10*3/uL (ref 0.7–4.0)
MCH: 28 pg (ref 26.0–34.0)
MCHC: 32.7 g/dL (ref 30.0–36.0)
MCV: 85.5 fL (ref 80.0–100.0)
Monocytes Absolute: 0.3 10*3/uL (ref 0.1–1.0)
Monocytes Relative: 5 %
Neutro Abs: 4.4 10*3/uL (ref 1.7–7.7)
Neutrophils Relative %: 77 %
Platelets: 266 10*3/uL (ref 150–400)
RBC: 4.15 MIL/uL (ref 3.87–5.11)
RDW: 13.5 % (ref 11.5–15.5)
WBC: 5.8 10*3/uL (ref 4.0–10.5)
nRBC: 1.6 % — ABNORMAL HIGH (ref 0.0–0.2)

## 2019-05-29 LAB — COMPREHENSIVE METABOLIC PANEL
ALT: 51 U/L — ABNORMAL HIGH (ref 0–44)
AST: 44 U/L — ABNORMAL HIGH (ref 15–41)
Albumin: 2 g/dL — ABNORMAL LOW (ref 3.5–5.0)
Alkaline Phosphatase: 100 U/L (ref 38–126)
Anion gap: 13 (ref 5–15)
BUN: 12 mg/dL (ref 6–20)
CO2: 11 mmol/L — ABNORMAL LOW (ref 22–32)
Calcium: 10.2 mg/dL (ref 8.9–10.3)
Chloride: 123 mmol/L — ABNORMAL HIGH (ref 98–111)
Creatinine, Ser: 0.96 mg/dL (ref 0.44–1.00)
GFR calc Af Amer: >60 mL/min (ref >60)
GFR calc non Af Amer: >60 mL/min (ref >60)
Glucose, Bld: 232 mg/dL — ABNORMAL HIGH (ref 70–99)
Potassium: 4.1 mmol/L (ref 3.5–5.1)
Sodium: 147 mmol/L — ABNORMAL HIGH (ref 135–145)
Total Bilirubin: 1.2 mg/dL (ref 0.3–1.2)
Total Protein: 6.1 g/dL — ABNORMAL LOW (ref 6.5–8.1)

## 2019-05-29 LAB — URINALYSIS, ROUTINE W REFLEX MICROSCOPIC
Bacteria, UA: NONE SEEN
Bilirubin Urine: NEGATIVE
Glucose, UA: NEGATIVE mg/dL
Hgb urine dipstick: NEGATIVE
Ketones, ur: 20 mg/dL — AB
Leukocytes,Ua: NEGATIVE
Nitrite: NEGATIVE
Protein, ur: 100 mg/dL — AB
Specific Gravity, Urine: 1.016 (ref 1.005–1.030)
pH: 5 (ref 5.0–8.0)

## 2019-05-29 LAB — PROCALCITONIN: Procalcitonin: 0.23 ng/mL

## 2019-05-29 LAB — BETA-HYDROXYBUTYRIC ACID
Beta-Hydroxybutyric Acid: 2.59 mmol/L — ABNORMAL HIGH (ref 0.05–0.27)
Beta-Hydroxybutyric Acid: 0.3 mmol/L — ABNORMAL HIGH (ref 0.05–0.27)

## 2019-05-29 LAB — GLUCOSE, CAPILLARY
Glucose-Capillary: 184 mg/dL — ABNORMAL HIGH (ref 70–99)
Glucose-Capillary: 184 mg/dL — ABNORMAL HIGH (ref 70–99)
Glucose-Capillary: 186 mg/dL — ABNORMAL HIGH (ref 70–99)
Glucose-Capillary: 185 mg/dL — ABNORMAL HIGH (ref 70–99)
Glucose-Capillary: 196 mg/dL — ABNORMAL HIGH (ref 70–99)
Glucose-Capillary: 208 mg/dL — ABNORMAL HIGH (ref 70–99)
Glucose-Capillary: 217 mg/dL — ABNORMAL HIGH (ref 70–99)
Glucose-Capillary: 221 mg/dL — ABNORMAL HIGH (ref 70–99)
Glucose-Capillary: 228 mg/dL — ABNORMAL HIGH (ref 70–99)
Glucose-Capillary: 238 mg/dL — ABNORMAL HIGH (ref 70–99)
Glucose-Capillary: 227 mg/dL — ABNORMAL HIGH (ref 70–99)
Glucose-Capillary: 201 mg/dL — ABNORMAL HIGH (ref 70–99)
Glucose-Capillary: 205 mg/dL — ABNORMAL HIGH (ref 70–99)
Glucose-Capillary: 211 mg/dL — ABNORMAL HIGH (ref 70–99)
Glucose-Capillary: 218 mg/dL — ABNORMAL HIGH (ref 70–99)
Glucose-Capillary: 216 mg/dL — ABNORMAL HIGH (ref 70–99)
Glucose-Capillary: 220 mg/dL — ABNORMAL HIGH (ref 70–99)
Glucose-Capillary: 194 mg/dL — ABNORMAL HIGH (ref 70–99)
Glucose-Capillary: 201 mg/dL — ABNORMAL HIGH (ref 70–99)
Glucose-Capillary: 139 mg/dL — ABNORMAL HIGH (ref 70–99)
Glucose-Capillary: 126 mg/dL — ABNORMAL HIGH (ref 70–99)
Glucose-Capillary: 163 mg/dL — ABNORMAL HIGH (ref 70–99)

## 2019-05-29 LAB — HEMOGLOBIN A1C
Hgb A1c MFr Bld: 7.3 % — ABNORMAL HIGH (ref 4.8–5.6)
Mean Plasma Glucose: 162.81 mg/dL

## 2019-05-29 LAB — RAPID URINE DRUG SCREEN, HOSP PERFORMED
Amphetamines: NOT DETECTED
Barbiturates: NOT DETECTED
Benzodiazepines: NOT DETECTED
Cocaine: NOT DETECTED
Opiates: NOT DETECTED
Tetrahydrocannabinol: NOT DETECTED

## 2019-05-29 LAB — FIBRINOGEN: Fibrinogen: 466 mg/dL (ref 210–475)

## 2019-05-29 LAB — TYPE AND SCREEN
ABO/RH(D): B POS
Antibody Screen: NEGATIVE

## 2019-05-29 LAB — MAGNESIUM: Magnesium: 2 mg/dL (ref 1.7–2.4)

## 2019-05-29 LAB — FERRITIN: Ferritin: 63 ng/mL (ref 11–307)

## 2019-05-29 LAB — ABO/RH: ABO/RH(D): B POS

## 2019-05-29 LAB — BLOOD GAS, VENOUS
Acid-base deficit: 11.2 mmol/L — ABNORMAL HIGH (ref 0.0–2.0)
Bicarbonate: 13.7 mmol/L — ABNORMAL LOW (ref 20.0–28.0)
O2 Saturation: 88.5 %
Patient temperature: 37
pCO2, Ven: 27.2 mmHg — ABNORMAL LOW (ref 44.0–60.0)
pH, Ven: 7.322 (ref 7.250–7.430)
pO2, Ven: 61.7 mmHg — ABNORMAL HIGH (ref 32.0–45.0)

## 2019-05-29 LAB — C-REACTIVE PROTEIN: CRP: 4.5 mg/dL — ABNORMAL HIGH (ref <1.0)

## 2019-05-29 LAB — HIV ANTIBODY (ROUTINE TESTING W REFLEX): HIV Screen 4th Generation wRfx: NONREACTIVE

## 2019-05-29 LAB — BRAIN NATRIURETIC PEPTIDE: B Natriuretic Peptide: 26.1 pg/mL (ref 0.0–100.0)

## 2019-05-29 LAB — PHOSPHORUS: Phosphorus: 2.6 mg/dL (ref 2.5–4.6)

## 2019-05-29 LAB — D-DIMER, QUANTITATIVE: D-Dimer, Quant: 1.32 ug/mL-FEU — ABNORMAL HIGH (ref 0.00–0.50)

## 2019-05-29 LAB — LACTATE DEHYDROGENASE: LDH: 252 U/L — ABNORMAL HIGH (ref 98–192)

## 2019-05-29 MED ORDER — DEXTROSE-NACL 5-0.45 % IV SOLN: INTRAVENOUS | Status: DC

## 2019-05-29 MED ORDER — DOCUSATE SODIUM 100 MG PO CAPS
100.0000 mg | ORAL_CAPSULE | Freq: Two times a day (BID) | ORAL | Status: DC
  Filled 2019-05-29: qty 1

## 2019-05-29 MED ORDER — SODIUM CHLORIDE 0.9% FLUSH
3.0000 mL | Freq: Two times a day (BID) | INTRAVENOUS | Status: DC
  Administered 2019-05-29 – 2019-06-07 (×10): 3 mL via INTRAVENOUS

## 2019-05-29 MED ORDER — DEXAMETHASONE SODIUM PHOSPHATE 10 MG/ML IJ SOLN: 6.0000 mg | INTRAMUSCULAR | Status: DC

## 2019-05-29 MED ORDER — SODIUM CHLORIDE 0.9 % IV SOLN: 100.0000 mg | Freq: Every day | INTRAVENOUS | Status: DC

## 2019-05-29 MED ORDER — INSULIN REGULAR(HUMAN) IN NACL 100-0.9 UT/100ML-% IV SOLN
INTRAVENOUS | Status: DC
  Administered 2019-05-29: 19 [IU]/h via INTRAVENOUS
  Administered 2019-05-29: 3.3 [IU]/h via INTRAVENOUS
  Administered 2019-05-30: 7 [IU]/h via INTRAVENOUS
  Administered 2019-05-30: 11 [IU]/h via INTRAVENOUS
  Administered 2019-05-31: 10.5 [IU]/h via INTRAVENOUS
  Administered 2019-06-01: 4.8 [IU]/h via INTRAVENOUS
  Administered 2019-06-01: 19 [IU]/h via INTRAVENOUS
  Administered 2019-06-01: 9.5 [IU]/h via INTRAVENOUS
  Administered 2019-06-02: 13 [IU]/h via INTRAVENOUS
  Administered 2019-06-02: 11.5 [IU]/h via INTRAVENOUS
  Administered 2019-06-03: 17 [IU]/h via INTRAVENOUS
  Administered 2019-06-04: 2.8 [IU]/h via INTRAVENOUS
  Filled 2019-05-29 (×13): qty 100

## 2019-05-29 MED ORDER — SODIUM CHLORIDE 0.9% FLUSH: 10.0000 mL | INTRAVENOUS | Status: DC | PRN

## 2019-05-29 MED ORDER — POLYETHYLENE GLYCOL 3350 17 G PO PACK: 17.0000 g | PACK | Freq: Every day | ORAL | Status: DC | PRN

## 2019-05-29 MED ORDER — PROMETHAZINE HCL 25 MG/ML IJ SOLN: 25.0000 mg | Freq: Four times a day (QID) | INTRAMUSCULAR | Status: DC | PRN

## 2019-05-29 MED ORDER — PRENATAL MULTIVITAMIN CH
1.0000 | ORAL_TABLET | Freq: Every day | ORAL | Status: DC
  Administered 2019-05-31 – 2019-06-08 (×9): 1 via ORAL
  Filled 2019-05-29 (×10): qty 1

## 2019-05-29 MED ORDER — ACETAMINOPHEN 325 MG PO TABS: 650.0000 mg | ORAL_TABLET | Freq: Four times a day (QID) | ORAL | Status: DC | PRN

## 2019-05-29 MED ORDER — SODIUM CHLORIDE 0.9 % IV SOLN: INTRAVENOUS | Status: DC

## 2019-05-29 MED ORDER — POTASSIUM CHLORIDE 10 MEQ/100ML IV SOLN
10.0000 meq | INTRAVENOUS | Status: AC
  Administered 2019-05-29 (×2): 10 meq via INTRAVENOUS
  Filled 2019-05-29: qty 100

## 2019-05-29 MED ORDER — ENOXAPARIN SODIUM 40 MG/0.4ML ~~LOC~~ SOLN
40.0000 mg | SUBCUTANEOUS | Status: DC
  Administered 2019-05-29: 40 mg via SUBCUTANEOUS
  Filled 2019-05-29 (×2): qty 0.4

## 2019-05-29 MED ORDER — ACETAMINOPHEN 650 MG RE SUPP
650.0000 mg | Freq: Four times a day (QID) | RECTAL | Status: DC | PRN
  Filled 2019-05-29: qty 1

## 2019-05-29 MED ORDER — GENERIC EXTERNAL MEDICATION
250.00 | Status: DC
Start: ? — End: 2019-05-29

## 2019-05-29 MED ORDER — BISACODYL 5 MG PO TBEC: 5.0000 mg | DELAYED_RELEASE_TABLET | Freq: Every day | ORAL | Status: DC | PRN

## 2019-05-29 MED ORDER — SODIUM CHLORIDE 0.9 % IV BOLUS
1000.0000 mL | Freq: Once | INTRAVENOUS | Status: AC
  Administered 2019-05-29: 1000 mL via INTRAVENOUS

## 2019-05-29 MED ORDER — GENERIC EXTERNAL MEDICATION
Status: DC
Start: ? — End: 2019-05-29

## 2019-05-29 MED ORDER — SODIUM CHLORIDE 0.9 % IV SOLN
200.0000 mg | Freq: Once | INTRAVENOUS | Status: AC
  Administered 2019-05-29: 200 mg via INTRAVENOUS
  Filled 2019-05-29: qty 40

## 2019-05-29 MED ORDER — PROMETHAZINE HCL 12.5 MG RE SUPP
12.5000 mg | Freq: Four times a day (QID) | RECTAL | Status: DC | PRN
  Filled 2019-05-29: qty 2

## 2019-05-29 MED ORDER — SODIUM CHLORIDE 0.9 % IV SOLN
200.0000 mg | Freq: Once | INTRAVENOUS | Status: DC
Start: 2019-05-29 — End: 2019-05-29

## 2019-05-29 MED ORDER — ONDANSETRON HCL 4 MG/2ML IJ SOLN
4.0000 mg | Freq: Four times a day (QID) | INTRAMUSCULAR | Status: DC | PRN
  Administered 2019-05-29 – 2019-05-31 (×3): 4 mg via INTRAVENOUS
  Filled 2019-05-29 (×4): qty 2

## 2019-05-29 MED ORDER — SODIUM CHLORIDE 0.9 % IV SOLN
10.00 | INTRAVENOUS | Status: DC
Start: ? — End: 2019-05-29

## 2019-05-29 MED ORDER — CALCIUM CARBONATE ANTACID 500 MG PO CHEW
2.0000 | CHEWABLE_TABLET | ORAL | Status: DC | PRN
  Administered 2019-06-02 – 2019-06-05 (×5): 400 mg via ORAL
  Filled 2019-05-29 (×5): qty 2

## 2019-05-29 MED ORDER — SODIUM CHLORIDE 0.9 % IV BOLUS
1000.0000 mL | INTRAVENOUS | Status: AC
  Administered 2019-05-29 (×2): 1000 mL via INTRAVENOUS

## 2019-05-29 MED ORDER — DEXTROSE 50 % IV SOLN: 0.0000 mL | INTRAVENOUS | Status: DC | PRN

## 2019-05-29 MED ORDER — SODIUM CHLORIDE 0.9% FLUSH
10.0000 mL | Freq: Two times a day (BID) | INTRAVENOUS | Status: DC
  Administered 2019-05-29: 10 mL

## 2019-05-29 MED ORDER — ACETAMINOPHEN 325 MG PO TABS
650.0000 mg | ORAL_TABLET | ORAL | Status: DC | PRN
  Administered 2019-06-05 – 2019-06-07 (×4): 650 mg via ORAL
  Filled 2019-05-29 (×4): qty 2

## 2019-05-29 MED ORDER — SODIUM CHLORIDE 0.9 % IV SOLN
100.0000 mg | Freq: Every day | INTRAVENOUS | Status: AC
  Administered 2019-05-30 – 2019-06-02 (×4): 100 mg via INTRAVENOUS
  Filled 2019-05-29 (×4): qty 20

## 2019-05-29 MED ORDER — ALBUTEROL SULFATE HFA 108 (90 BASE) MCG/ACT IN AERS
2.0000 | INHALATION_SPRAY | Freq: Four times a day (QID) | RESPIRATORY_TRACT | Status: DC
  Administered 2019-05-29 – 2019-06-08 (×38): 2 via RESPIRATORY_TRACT
  Filled 2019-05-29 (×2): qty 6.7

## 2019-05-29 MED ORDER — POTASSIUM CHLORIDE 10 MEQ/100ML IV SOLN
10.0000 meq | INTRAVENOUS | Status: AC
  Administered 2019-05-29 – 2019-05-30 (×2): 10 meq via INTRAVENOUS
  Filled 2019-05-29 (×2): qty 100

## 2019-05-29 MED ORDER — ZOLPIDEM TARTRATE 5 MG PO TABS
5.0000 mg | ORAL_TABLET | Freq: Every evening | ORAL | Status: DC | PRN
  Administered 2019-06-07: 5 mg via ORAL
  Filled 2019-05-29 (×2): qty 1

## 2019-05-29 MED ORDER — SODIUM CHLORIDE 0.9 % IV BOLUS: 1000.0000 mL | Freq: Once | INTRAVENOUS | Status: DC

## 2019-05-29 MED ORDER — DOCUSATE SODIUM 100 MG PO CAPS
100.0000 mg | ORAL_CAPSULE | Freq: Every day | ORAL | Status: DC
  Administered 2019-05-30 – 2019-06-08 (×10): 100 mg via ORAL
  Filled 2019-05-29 (×10): qty 1

## 2019-05-29 MED ORDER — ONDANSETRON HCL 4 MG PO TABS
4.0000 mg | ORAL_TABLET | Freq: Four times a day (QID) | ORAL | Status: DC | PRN
  Administered 2019-06-03: 4 mg via ORAL
  Filled 2019-05-29: qty 1

## 2019-05-29 NOTE — Progress Notes (Signed)
Patient ID: Kelli Mcpherson, female   DOB: 05-Nov-1995, 24 y.o.   MRN: [id number] CTSP for late decels and minimal variability Recent Anion Gap of 23 and Glc 390 earlier today Suspect DKA BPP 4/8 Reviewed these with Dr Grace Bushy (MFM) He recommends aggressive management of fluids and DKA.  FU labs This should improve as the DKA improves and would not deliver at this time. DL

## 2019-05-29 NOTE — Progress Notes (Signed)
Patient admitted to 5W02. She was able to ambulate to and from the restroom with stand by assist. She is oriented and answers all questions when asked, she does appear sleepy and wakes to voice.   Her vitals are stable at this time, her CBG is 184  and she is resting in bed. Admitting MD made aware of arrival and we are awaiting admission orders. Will continue to monitor closely.

## 2019-05-29 NOTE — Consult Note (Signed)
MFM Consult Telemedicine  Date of Service 05/29/19 Requesting provider: Dr. Candice Camp Reason for request: Maternal DKA  This consultation was performed by chart review and discussion with the nurse. As Kelli Mcpherson was fatigued and somnolent not available for an interview.   Kelli Mcpherson is 24 yo G1P0 at 31w 6 who was admitted due to suspected diabetic ketoacidosis and COVID pneumonia  She was initially evaluated at [facility]  with a anion gap of 26 and glucose of 391. She was transferred to our ER and now to the [facility] service where I was called for consultation.  She began aggressive hydration by the medicine team of Dr. Ophelia Charter. She had a positive chest xray suggestive of atypical pnuemonia and remdesir was administerd.  An initial BPP was ordered and was 4/8 the NST tracing was CAT 2 with late decelerations and moderate/minimal variability. I was in contact with Dr. Rana Snare throughout initial management, while labs were pending. Her initial Beta Hydroxybutyric was 2.59 suggesting ketoacidosis. However, her anion gap had improved and her CO2 had improved. Her capillary Glucose was 227. With a HbA1c of 7.3  Her LFT's were trending down from those taken at [facility] over night with mildly elevated blood pressures.   She was not tachyepnic or dyspneic.  At this time I advised Dr. Rana Snare to withhold betamethsone, with continuous fetal monitoring, continue insulin drip with the goal of 140-100 and aggressively hydrate. I discussed the care with Dr Ophelia Charter as well with a particular discussion of holding decadron until the DKA was resolved. She felt comfortable with that but would contact me if decadron was needed.  Vitals with BMI 05/29/2019 05/29/2019 05/29/2019  Height - - -  Weight - - -  BMI - - -  Systolic 142 146 -  Diastolic 88 88 -  Pulse 102 99 105   CBC    Component Value Date/Time   WBC 5.8 05/29/2019 1247   RBC 4.15 05/29/2019 1247   HGB 11.6 (L) 05/29/2019 1247   HCT 35.5 (L) 05/29/2019 1247   PLT  266 05/29/2019 1247   MCV 85.5 05/29/2019 1247   MCH 28.0 05/29/2019 1247   MCHC 32.7 05/29/2019 1247   RDW 13.5 05/29/2019 1247   LYMPHSABS 0.8 05/29/2019 1247   MONOABS 0.3 05/29/2019 1247   EOSABS 0.0 05/29/2019 1247   BASOSABS 0.0 05/29/2019 1247   CMP Latest Ref Rng & Units 05/29/2019 05/24/2019 05/22/2019  Glucose 70 - 99 mg/dL 542(H) 062(B) 762(G)  BUN 6 - 20 mg/dL 12 <3(T) 5(L)  Creatinine 0.44 - 1.00 mg/dL 5.17 6.16 0.73  Sodium 135 - 145 mmol/L 147(H) 137 136  Potassium 3.5 - 5.1 mmol/L 4.1 3.9 3.5  Chloride 98 - 111 mmol/L 123(H) 112(H) 108  CO2 22 - 32 mmol/L 11(L) 8(L) 13(L)  Calcium 8.9 - 10.3 mg/dL 71.0 62.6 9.1  Total Protein 6.5 - 8.1 g/dL 6.1(L) 7.4 6.2(L)  Total Bilirubin 0.3 - 1.2 mg/dL 1.2 0.8 0.9  Alkaline Phos 38 - 126 U/L 100 110 91  AST 15 - 41 U/L 44(H) 26 20  ALT 0 - 44 U/L 51(H) 17 15   OB History  Gravida Para Term Preterm AB Living  1            SAB TAB Ectopic Multiple Live Births               # Outcome Date GA Lbr Len/2nd Weight Sex Delivery Anes PTL Lv  1 Current  Past Surgical History:  Procedure Laterality Date  . LYMPHADENECTOMY     Past Medical History:  Diagnosis Date  . Chlamydia   . COVID-19   . Eczema   . Gonorrhea    Social History   Socioeconomic History  . Marital status: Single    Spouse name: Not on file  . Number of children: Not on file  . Years of education: Not on file  . Highest education level: Not on file  Occupational History  . Not on file  Tobacco Use  . Smoking status: Former Research scientist (life sciences)  . Smokeless tobacco: Former Systems developer  . Tobacco comment: patient currently does not smoke   Substance and Sexual Activity  . Alcohol use: Not Currently    Comment: socially  . Drug use: Not Currently    Types: "Crack" cocaine  . Sexual activity: Yes  Other Topics Concern  . Not on file  Social History Narrative  . Not on file   Social Determinants of Health   Financial Resource Strain:   . Difficulty of  Paying Living Expenses:   Food Insecurity:   . Worried About Charity fundraiser in the Last Year:   . Arboriculturist in the Last Year:   Transportation Needs:   . Film/video editor (Medical):   Marland Kitchen Lack of Transportation (Non-Medical):   Physical Activity:   . Days of Exercise per Week:   . Minutes of Exercise per Session:   Stress:   . Feeling of Stress :   Social Connections:   . Frequency of Communication with Friends and Family:   . Frequency of Social Gatherings with Friends and Family:   . Attends Religious Services:   . Active Member of Clubs or Organizations:   . Attends Archivist Meetings:   Marland Kitchen Marital Status:   Intimate Partner Violence:   . Fear of Current or Ex-Partner:   . Emotionally Abused:   Marland Kitchen Physically Abused:   . Sexually Abused:    Family History  Problem Relation Age of Onset  . Diabetes Mother   . Hypertension Mother   . Healthy Father    No current facility-administered medications on file prior to encounter.   Current Outpatient Medications on File Prior to Encounter  Medication Sig Dispense Refill  . metFORMIN (GLUCOPHAGE-XR) 750 MG 24 hr tablet Take 750 mg by mouth 2 (two) times daily.    . ondansetron (ZOFRAN-ODT) 8 MG disintegrating tablet Take 1 tablet (8 mg total) by mouth every 8 (eight) hours as needed for nausea. (Patient taking differently: Take 8 mg by mouth every 8 (eight) hours as needed for nausea (DISSOLVE ORALLY). ) 12 tablet 0  . Prenatal Vit-Fe Phos-FA-Omega (VITAFOL GUMMIES) 3.33-0.333-34.8 MG CHEW Chew 3 each by mouth daily.    . promethazine (PHENERGAN) 25 MG tablet Take 1 tablet (25 mg total) by mouth every 8 (eight) hours as needed for nausea or vomiting. 8 tablet 0  . triamcinolone cream (KENALOG) 0.1 % Apply 1 application topically 2 (two) times daily as needed (for itching).     Impression/Counseling:  1) COVID+ with possible pneumonia: continue maternal care per medicine protocol. Overall appears stable no  change is required at this time. Please consult MFM is she becomes hypoxic or requiring intubation.  2) DKA: 1) Continuous fetal monitoring we expect fetal status to consist of Cat 2 tracing.  Fetal rescucitation will mimic maternal resolution of acidemia 2) Continue aggressive fluid rescucitation and insulin infusion until maternal bicarbonate  levels return to normal, with close attention to K and Na levels. 3) Avoid fetal intervention until maternal acidemia is improved, unless recurrent prolonged decelerations are detected. 4) Withhold betamethsone as this could result in worsening DKA 5) Maintain total urine output ~50 mL/hr  I discussed this treatment plan with Dr. Rana Snare and Dr. Ophelia Charter  I spent 60 minutes with > 50% in coordination of care. I spoke with Kelli Mcpherson briefly given her somnolence.  Novella Olive, MD.

## 2019-05-29 NOTE — Progress Notes (Signed)
Patient ID: Kelli Mcpherson, female   DOB: 1995/03/17, 24 y.o.   MRN: [id number] Labs are c/w Acidosis with Kelli Mcpherson Beta HBA 2.59 AG has improved.  She remains on IVF and Insulin with Glc level around 200 FHR remains 150s with minimal variability and recurrent late decels I discussed this with Dr Grace Bushy.  He believes this is a result of the acidosis and should recover with current management.  He does Not think delivery is in Kelli Mcpherson or the baby's best interest at this time.  Will deliver for fetal distress per Dr Grace Bushy. DL

## 2019-05-29 NOTE — H&P (Addendum)
Kelli Mcpherson is a 24 y.o. female presenting for obstetrical management of DKA, Covid sxs.  Pt is a patient of our practice who has  AODM that had poor compliance with her Endocrinologist, referred to [facility] endocrinologist who released her due to combativeness.  She had been repeatedly cautioned about the importance of glucose control and monitoring by our physicians, yet didn't comply.  She developed Covid 05/17/19 and had several ER visits and sent home.  Last night presented to [facility] ED in DKA and transferred to [facility].  Her last visit with [place] was 05/05/19 at 29 weeks. She reports she has not monitored her glucose levels for the last month.  And in the last week, had lost appetite and finally dehydrated and went to ED. She appears somnolent and does answer questions quickly.  She denies pain, bleeding or ctxs.  Reports last FM this morning. Per Dr Ophelia Charter:  HPI: Kelli Mcpherson is a 24 y.o. female with medical history significant of GC/Chl; and gestational DM with current pregnancy presenting from [facility] with n/v after being diagnosed with COVID-19 infection on 4/27.  She was seen in the ER here on 4/27 after COVID exposure (her brother); she had had emesis for the first time during her pregnancy at that time and tested positive for COVID.  She returned on 5/2 with SOB, n/v; she was evaluated by [facility] and was discharged with CO2 15, thought to be due to dehydration.    She returned with fever on 5/4; CO2 was 8, she was given IVF and tolerated PO and so was discharged.  Urine culture grew out lactobacillus which was not treated.  She is quite somnolent this AM.  She may have received Phenergan as this was on her order list (but does not show that it was given on her MAR).  She does awaken and nod in answer to questions, seemingly appropriately.  She denies current n/v.  She reports that the baby is moving.  Her mother reports that she was somnolent, would just stare last  night in the ER.  Her mother reports that she is speaking a little more clearly now and head nodding is also an improvement.  ED Course:  Transfer, per Dr. Antionette Char:  [redacted] weeks gestation with gestational DM, HTN, diagnosed with COVID on 4/27, and recent ED visits for nausea who went to ED today for malaise and nausea and is found to be in DKA. HR is 104, BP 167/96, vitals otherwise normal. She was given Phenergan, IVF, and started on insulin gtt.  OB History    Gravida  1   Para      Term      Preterm      AB      Living        SAB      TAB      Ectopic      Multiple      Live Births             Past Medical History:  Diagnosis Date  . Chlamydia   . COVID-19   . Eczema   . Gonorrhea    Past Surgical History:  Procedure Laterality Date  . LYMPHADENECTOMY     Family History: family history includes Diabetes in her mother; Healthy in her father; Hypertension in her mother. Social History:  reports that she has quit smoking. She has quit using smokeless tobacco. She reports previous alcohol use. She reports previous drug use.  Drug: "Crack" cocaine.     Review of Systems History   Blood pressure 135/84, pulse (!) 103, temperature 98.4 F (36.9 C), temperature source Oral, resp. rate (!) 21, height 6' (1.829 m), weight 123.2 kg, last menstrual period 07/31/2018, SpO2 100 %. Exam Physical Exam   Per admitting MD:  General:  Appears calm and comfortable and is NAD, somnolent  Eyes:   EOMI, normal lids, iris  ENT:  grossly normal hearing, lips & tongue, mildly dry mm  Neck:  no LAD, masses or thyromegaly; no carotid bruits  Cardiovascular:  RR with mild tachycardia, no m/r/g. No LE edema.   Respiratory:   CTA bilaterally with no wheezes/rales/rhonchi.  Normal respiratory effort.  Abdomen:  soft, NT, ND, NABS  Back:   normal alignment, no CVAT  Skin:  no rash or induration seen on limited exam  Musculoskeletal:  grossly normal tone BUE/BLE, good ROM, no  bony abnormality  Lower extremity:  No LE edema.  Limited foot exam with no ulcerations.  2+ distal pulses.  Psychiatric:  grossly normal mood and affect, speech fluent and appropriate, AOx3  Neurologic:  CN 2-12 grossly intact, moves all extremities in coordinated fashion, sensation intact       Assessment/Plan: Principal Problem:   COVID-19 affecting pregnancy in third trimester Active Problems:   Obesity   DKA (diabetic ketoacidosis) (HCC)   31 week IUP -Patient with reportedly normal pregnancy complicated by recent COVID-19 infection, diagnosed on 4/27 (see below) -As per protocol, patient will be admitted to the [facility] service (Dr. Rana Snare) with co-management by [facility] for all medical issues -She was initially placed on 5W but given active 3rd trimester pregnancy, will transfer to the Antepartum area -Limited US at [facility] on 5/8 suggested low amniotic fluid; while this is likely related to the patient's overall dehydration (see below), certainly it warrants close fetal monitoring and possibly f/u US -All OB management per OB  COVID-19 infection -Patient with presenting with mild SOB, reported fever at home, fatigue, and n/v -She does not have a current O2 requirement  -COVID POSITIVE on 4/27 -The patient has comorbidities which may increase the risk for ARDS/MODS including:  DM, obesity -Pertinent labs concerning for COVID include lymphopenia; increased LFTs; additional COVID labs are pending -CXR pending, but negative on 5/2 and patient without significant respiratory symptoms -Will not treat with broad-spectrum antibiotics if procalcitonin <0.1 and/or if CXR is negative for infiltrates -Will admit for further evaluation, close monitoring, and treatment -Monitor on telemetry x at least 24 hours -At this time, will attempt to avoid use of aerosolized medications and use HFAs instead -Will check daily labs including BMP with Mag, Phos; LFTs; CBC with differential; CRP; ferritin;  fibrinogen; D-dimer -Consider Echo since patient has troponin elevation; will continue to trend HS troponin -Will order steroids and Remdesivir (pharmacy consult) given +COVID test if +CXR and/or hypoxia <94% on room air -If the patient shows clinical deterioration, consider transfer to ICU with PCCM consultation -Will attempt to maintain euvolemia to a net negative fluid status; she is currently quite dehydrated (see below) -Patient was seen wearing full PPE including: gown, gloves, head cover, N95, and face shield; donning and doffing was in compliance with current standards.  DKA -Patient without known prior DM, only gestational DM per report -Her mother reports that she was borderline just prior to the pregnancy -While she may have DKA, the issue appears equally likely to be severe dehydration resulting in acidosis with mildly elevated glucose as well -Regardless,  needs intensive treatment with IVF and it is reasonable to use the Endotool for management, as well -Severe DKA on admission by criteria based on HCO3 9, gap >12, patient somnolent -Will admit to antepartum with DKA protocol -NPO for now until gap closes and bicarb normalizes -K+ normal at time of presentation and so potassium supplementation added -IVF at 150 cc/hr, NS until glucose <250 and then decrease rate to 125 and change to D51/2NS  Obstetical FWB: Rec BPP and continuous EFM I have consulted MFM for co management of this patient due to multiple medical concerns.      Luz Lex 05/29/2019, 10:57 AM

## 2019-05-29 NOTE — Progress Notes (Signed)
ADA Standards of Care 2021 Diabetes in Pregnancy Target Glucose Ranges:  Fasting: 60 - 90 mg/dL Preprandial: 60 - 943 mg/dL 1 hr postprandial: Less than 140mg /dL (from first bite of meal) 2 hr postprandial: Less than 120 mg/dL (from first bit of meal)  Note patient admitted with COVID and DKA in pregnancy (31 weeks).   Patient was not on insulin prior to admit however has not been able to eat/vomitting.    Agree with start of IV insulin.  Will need tighter goals due to patient being pregnant. Recommend goal of 110-140 mg/dL initially. Spoke with charge nurse and patient being transferred to Specialty OB unit.   Thanks,  , RN, BC-ADM Inpatient Diabetes Coordinator Pager [phone]

## 2019-05-29 NOTE — Progress Notes (Signed)
Patient safely transferred over to [facility] specialty. Report was given to [facility] over phone and bedside. Dr. Rana Snare entering the room as transferring RN leaving. OB rapid response nurse informed of fetal monitoring per MDs order.

## 2019-05-29 NOTE — Progress Notes (Signed)
Pharmacy Antibiotic Note  Kelli Mcpherson is a 24 y.o. female 31wk6d gestation, covid positive on 4/27, admitted on 05/29/2019 with worsening symptom, GDM also in DKA.  Pharmacy has been consulted for Remdesivir dosing. Will also start decadron 6mg  IV daily  CXR clear on 5/2, now patchy bilateral airspace opacities. ALT 17 (5/4) > 51 (5/9)   lovneox for VTE px, D-dimer 1.32, mildly elevated  Plan: Remdesivir 200mg  x 1 then 100mg  Q 24 hrs x 4 days Monitor electrolyte Monitor ALT trend Will increase lovenox to 60mg  (0.5mg /kg) daily  Monitor glycemic control after off insulin gtt, now on on decadron  Height: 6' (182.9 cm) Weight: 123.2 kg (271 lb 9.7 oz) IBW/kg (Calculated) : 73.1  Temp (24hrs), Avg:98.8 F (37.1 C), Min:98.4 F (36.9 C), Max:99.1 F (37.3 C)  Recent Labs  Lab 05/24/19 1904 05/29/19 1247  WBC 7.7 5.8  CREATININE 1.00 0.96  LATICACIDVEN 1.2  --     Estimated Creatinine Clearance: 135.1 mL/min (by C-G formula based on SCr of 0.96 mg/dL).    No Known Allergies  Antimicrobials this admission: Remdesivir 5/9 >>  Microbiology results: 5/9 Urine 4/27 Covid +  Thank you for allowing pharmacy to be a part of this patient's care.  07/29/19, PharmD, BCPS, BCPPS Clinical Pharmacist  Pager: [phone]   05/29/2019 2:51 PM

## 2019-05-29 NOTE — H&P (Addendum)
History and Physical    Kelli Mcpherson:[id number] DOB: October 25, 1995 DOA: 05/29/2019  PCP: Ranae Pila, MD Consultants:  None Patient coming from:  Home; NOK: Mother, Kelli Mcpherson, [phone]  Chief Complaint: n/v  HPI: Kelli Mcpherson is a 24 y.o. female with medical history significant of GC/Chl; and gestational DM with current pregnancy presenting from [facility] with n/v after being diagnosed with COVID-19 infection on 4/27.  She was seen in the ER here on 4/27 after COVID exposure (her brother); she had had emesis for the first time during her pregnancy at that time and tested positive for COVID.  She returned on 5/2 with SOB, n/v; she was evaluated by [facility] and was discharged with CO2 15, thought to be due to dehydration.    She returned with fever on 5/4; CO2 was 8, she was given IVF and tolerated PO and so was discharged.  Urine culture grew out lactobacillus which was not treated.  She is quite somnolent this AM.  She may have received Phenergan as this was on her order list (but does not show that it was given on her MAR).  She does awaken and nod in answer to questions, seemingly appropriately.  She denies current n/v.  She reports that the baby is moving.  Her mother reports that she was somnolent, would just stare last night in the ER.  Her mother reports that she is speaking a little more clearly now and head nodding is also an improvement.    ED Course:  Transfer, per Dr. Antionette Char:  [redacted] weeks gestation with gestational DM, HTN, diagnosed with COVID on 4/27, and recent ED visits for nausea who went to ED today for malaise and nausea and is found to be in DKA. HR is 104, BP 167/96, vitals otherwise normal. She was given Phenergan, IVF, and started on insulin gtt.   Review of Systems: As per HPI; otherwise review of systems reviewed and negative. Limited by patient's somnolence.  Ambulatory Status:  Ambulates without assistance    Past Medical  History:  Diagnosis Date  . Chlamydia   . COVID-19   . Eczema   . Gestational diabetes   . Gonorrhea   . Pregnancy induced hypertension     Past Surgical History:  Procedure Laterality Date  . LYMPHADENECTOMY    . WISDOM TOOTH EXTRACTION      Social History   Socioeconomic History  . Marital status: Single    Spouse name: Not on file  . Number of children: Not on file  . Years of education: Not on file  . Highest education level: Not on file  Occupational History  . Not on file  Tobacco Use  . Smoking status: Former Smoker    Types: Cigars  . Smokeless tobacco: Former Neurosurgeon  . Tobacco comment: patient currently does not smoke   Substance and Sexual Activity  . Alcohol use: Not Currently    Comment: socially  . Drug use: Not Currently  . Sexual activity: Yes  Other Topics Concern  . Not on file  Social History Narrative  . Not on file   Social Determinants of Health   Financial Resource Strain:   . Difficulty of Paying Living Expenses:   Food Insecurity:   . Worried About Programme researcher, broadcasting/film/video in the Last Year:   . Barista in the Last Year:   Transportation Needs:   . Freight forwarder (Medical):   Marland Kitchen Lack of Transportation (  Non-Medical):   Physical Activity:   . Days of Exercise per Week:   . Minutes of Exercise per Session:   Stress:   . Feeling of Stress :   Social Connections:   . Frequency of Communication with Friends and Family:   . Frequency of Social Gatherings with Friends and Family:   . Attends Religious Services:   . Active Member of Clubs or Organizations:   . Attends Archivist Meetings:   Marland Kitchen Marital Status:   Intimate Partner Violence:   . Fear of Current or Ex-Partner:   . Emotionally Abused:   Marland Kitchen Physically Abused:   . Sexually Abused:     No Known Allergies  Family History  Problem Relation Age of Onset  . Diabetes Mother   . Hypertension Mother   . Hypertension Father     Prior to Admission medications    Medication Sig Start Date End Date Taking? Authorizing Provider  ibuprofen (ADVIL) 800 MG tablet Take 1 tablet (800 mg total) by mouth 3 (three) times daily with meals. Patient not taking: Reported on 05/24/2019 08/07/18   Vanessa Kick, MD  metFORMIN (GLUCOPHAGE-XR) 750 MG 24 hr tablet Take 750 mg by mouth 2 (two) times daily. 05/06/19   [provider]  ondansetron (ZOFRAN-ODT) 8 MG disintegrating tablet Take 1 tablet (8 mg total) by mouth every 8 (eight) hours as needed for nausea. Patient taking differently: Take 8 mg by mouth every 8 (eight) hours as needed for nausea (DISSOLVE ORALLY).  05/17/19   Robyn Haber, MD  Prenatal Vit-Fe Phos-FA-Omega (VITAFOL GUMMIES) 3.33-0.333-34.8 MG CHEW Chew 3 each by mouth daily. 04/13/19   [provider]  promethazine (PHENERGAN) 25 MG tablet Take 1 tablet (25 mg total) by mouth every 8 (eight) hours as needed for nausea or vomiting. 05/24/19   Little, Wenda Overland, MD  triamcinolone cream (KENALOG) 0.1 % Apply 1 application topically 2 (two) times daily as needed (for itching).    [provider]    Physical Exam: Vitals:   06/10/19 2110 06/10/19 2345 06/11/19 0530 06/11/19 0959  BP: (!) 131/57 (!) 143/77 (!) 139/58 135/67  Pulse: 94 98 98 98  Resp: (!) 22 19 18 18   Temp: 98.2 F (36.8 C)  98.7 F (37.1 C) 98.6 F (37 C)  TempSrc: Oral  Oral Oral  SpO2: 99% 99% 99% 99%  Weight:      Height:         . General:  Appears calm and comfortable and is NAD, somnolent . Eyes:   EOMI, normal lids, iris . ENT:  grossly normal hearing, lips & tongue, mildly dry mm . Neck:  no LAD, masses or thyromegaly; no carotid bruits . Cardiovascular:  RR with mild tachycardia, no m/r/g. No LE edema.  Marland Kitchen Respiratory:   CTA bilaterally with no wheezes/rales/rhonchi.  Normal respiratory effort. . Abdomen:  soft, NT, ND, NABS . Back:   normal alignment, no CVAT . Skin:  no rash or induration seen on limited exam . Musculoskeletal:  grossly  normal tone BUE/BLE, good ROM, no bony abnormality . Lower extremity:  No LE edema.  Limited foot exam with no ulcerations.  2+ distal pulses. Marland Kitchen Psychiatric:  grossly normal mood and affect, speech fluent and appropriate, AOx3 . Neurologic:  CN 2-12 grossly intact, moves all extremities in coordinated fashion, sensation intact    Radiological Exams on Admission: No results found.   OB US with oligohydramnios  EKG:  Not done   Labs on  Admission: I have personally reviewed the available labs and imaging studies at the time of the admission.  Pertinent labs at [facility]:   CO2 9; 8 on 5/4;13 on 5/2; 15 on 5/2 Glucose 391 AST 60/ALT 60 Anion gap 23 Unremarkable CBC with lymphopenia COVID POSITIVE on 4/27  At [facility]: Glucose 184 x 2   Assessment/Plan Principal Problem:   COVID-19 affecting pregnancy in third trimester Active Problems:   Obesity   DKA (diabetic ketoacidosis) (HCC)   Pre-eclampsia   31 week IUP -Patient with reportedly normal pregnancy complicated by recent COVID-19 infection, diagnosed on 4/27 (see below) -As per protocol, patient will be admitted to the [facility] service (Dr. Rana Snare) with co-management by [facility] for all medical issues -She was initially placed on 5W but given active 3rd trimester pregnancy, will transfer to the Antepartum area -Limited US at [facility] on 5/8 suggested low amniotic fluid; while this is likely related to the patient's overall dehydration (see below), certainly it warrants close fetal monitoring and possibly f/u US -All OB management per OB  COVID-19 infection -Patient with presenting with mild SOB, reported fever at home, fatigue, and n/v -She does not have a current O2 requirement  -COVID POSITIVE on 4/27 -The patient has comorbidities which may increase the risk for ARDS/MODS including:  DM, obesity -Pertinent labs concerning for COVID include lymphopenia; increased LFTs; additional COVID labs are pending -CXR pending, but negative on 5/2  and patient without significant respiratory symptoms -Will not treat with broad-spectrum antibiotics if procalcitonin <0.1 and/or if CXR is negative for infiltrates -Will admit for further evaluation, close monitoring, and treatment -Monitor on telemetry x at least 24 hours -At this time, will attempt to avoid use of aerosolized medications and use HFAs instead -Will check daily labs including BMP with Mag, Phos; LFTs; CBC with differential; CRP; ferritin; fibrinogen; D-dimer -Consider Echo since patient has troponin elevation; will continue to trend HS troponin -Will order steroids and Remdesivir (pharmacy consult) given +COVID test if +CXR and/or hypoxia <94% on room air -If the patient shows clinical deterioration, consider transfer to ICU with PCCM consultation -Will attempt to maintain euvolemia to a net negative fluid status; she is currently quite dehydrated (see below) -Patient was seen wearing full PPE including: gown, gloves, head cover, N95, and face shield; donning and doffing was in compliance with current standards.  DKA -Patient without known prior DM, only gestational DM per report -Her mother reports that she was borderline just prior to the pregnancy -While she may have DKA, the issue appears equally likely to be severe dehydration resulting in acidosis with mildly elevated glucose as well -Regardless, needs intensive treatment with IVF and it is reasonable to use the Endotool for management, as well -Severe DKA on admission by criteria based on HCO3 9, gap >12, patient somnolent -Will admit to antepartum with DKA protocol -NPO for now until gap closes and bicarb normalizes -K+ normal at time of presentation and so potassium supplementation added -IVF at 150 cc/hr, NS until glucose <250 and then decrease rate to 125 and change to D51/2NS  Obesity Body mass index is 37.08 kg/m. -Will need encouragement about weight loss post-partum    DVT prophylaxis:  Lovenox  Code  Status:  Full  Family Communication: None present; I spoke with the patient's mother by telephone. Disposition Plan:  The patient is from: home  Anticipated d/c is to: home without [facility] services   Anticipated d/c date will depend on clinical response to treatment, likely several days  Patient is currently: acutely ill Consults called: OB/TRH co-management  Admission status: Admit - It is my clinical opinion that admission to INPATIENT is reasonable and necessary because of the expectation that this patient will require hospital care that crosses at least 2 midnights to treat this condition based on the medical complexity of the problems presented.  Given the aforementioned information, the predictability of an adverse outcome is felt to be significant.    Jonah Blue MD [facility]   How to contact the [facility] Attending or Consulting provider 7A - 7P or covering provider during after hours 7P -7A, for this patient?  1. Check the care team in [facility] and look for a) attending/consulting TRH provider listed and b) the [facility] team listed 2. Log into [email or web address] and use [facility]'s universal password to access. If you do not have the password, please contact the hospital operator. 3. Locate the [facility] provider you are looking for under [facility] and page to a number that you can be directly reached. 4. If you still have difficulty reaching the provider, please page the [facility] (Director on Call) for the Hospitalists listed on amion for assistance.   06/25/2019, 7:30 AM

## 2019-05-30 ENCOUNTER — Inpatient Hospital Stay (HOSPITAL_BASED_OUTPATIENT_CLINIC_OR_DEPARTMENT_OTHER): Payer: Managed Care, Other (non HMO)

## 2019-05-30 DIAGNOSIS — O163 Unspecified maternal hypertension, third trimester: Secondary | ICD-10-CM

## 2019-05-30 DIAGNOSIS — O98513 Other viral diseases complicating pregnancy, third trimester: Secondary | ICD-10-CM | POA: Diagnosis not present

## 2019-05-30 DIAGNOSIS — Z3A32 32 weeks gestation of pregnancy: Secondary | ICD-10-CM

## 2019-05-30 DIAGNOSIS — O24419 Gestational diabetes mellitus in pregnancy, unspecified control: Secondary | ICD-10-CM

## 2019-05-30 DIAGNOSIS — O24415 Gestational diabetes mellitus in pregnancy, controlled by oral hypoglycemic drugs: Secondary | ICD-10-CM | POA: Diagnosis not present

## 2019-05-30 DIAGNOSIS — Z3A31 31 weeks gestation of pregnancy: Secondary | ICD-10-CM

## 2019-05-30 DIAGNOSIS — U071 COVID-19: Secondary | ICD-10-CM | POA: Diagnosis not present

## 2019-05-30 DIAGNOSIS — E1310 Other specified diabetes mellitus with ketoacidosis without coma: Secondary | ICD-10-CM

## 2019-05-30 LAB — CBC WITH DIFFERENTIAL/PLATELET
Abs Immature Granulocytes: 0.25 10*3/uL — ABNORMAL HIGH (ref 0.00–0.07)
Basophils Absolute: 0 10*3/uL (ref 0.0–0.1)
Basophils Relative: 1 %
Eosinophils Absolute: 0.1 10*3/uL (ref 0.0–0.5)
Eosinophils Relative: 1 %
HCT: 35.4 % — ABNORMAL LOW (ref 36.0–46.0)
Hemoglobin: 11.5 g/dL — ABNORMAL LOW (ref 12.0–15.0)
Immature Granulocytes: 4 %
Lymphocytes Relative: 16 %
Lymphs Abs: 1 10*3/uL (ref 0.7–4.0)
MCH: 28 pg (ref 26.0–34.0)
MCHC: 32.5 g/dL (ref 30.0–36.0)
MCV: 86.1 fL (ref 80.0–100.0)
Monocytes Absolute: 0.4 10*3/uL (ref 0.1–1.0)
Monocytes Relative: 7 %
Neutro Abs: 4.6 10*3/uL (ref 1.7–7.7)
Neutrophils Relative %: 71 %
Platelets: 233 10*3/uL (ref 150–400)
RBC: 4.11 MIL/uL (ref 3.87–5.11)
RDW: 13.6 % (ref 11.5–15.5)
WBC: 6.4 10*3/uL (ref 4.0–10.5)
nRBC: 3.5 % — ABNORMAL HIGH (ref 0.0–0.2)

## 2019-05-30 LAB — BASIC METABOLIC PANEL
Anion gap: 8 (ref 5–15)
Anion gap: 10 (ref 5–15)
BUN: 9 mg/dL (ref 6–20)
BUN: 10 mg/dL (ref 6–20)
CO2: 16 mmol/L — ABNORMAL LOW (ref 22–32)
CO2: 15 mmol/L — ABNORMAL LOW (ref 22–32)
Calcium: 10.1 mg/dL (ref 8.9–10.3)
Calcium: 10.2 mg/dL (ref 8.9–10.3)
Chloride: 127 mmol/L — ABNORMAL HIGH (ref 98–111)
Chloride: 122 mmol/L — ABNORMAL HIGH (ref 98–111)
Creatinine, Ser: 0.97 mg/dL (ref 0.44–1.00)
Creatinine, Ser: 0.87 mg/dL (ref 0.44–1.00)
GFR calc Af Amer: >60 mL/min (ref >60)
GFR calc Af Amer: >60 mL/min (ref >60)
GFR calc non Af Amer: >60 mL/min (ref >60)
GFR calc non Af Amer: >60 mL/min (ref >60)
Glucose, Bld: 134 mg/dL — ABNORMAL HIGH (ref 70–99)
Glucose, Bld: 116 mg/dL — ABNORMAL HIGH (ref 70–99)
Potassium: 3.6 mmol/L (ref 3.5–5.1)
Potassium: 3.4 mmol/L — ABNORMAL LOW (ref 3.5–5.1)
Sodium: 151 mmol/L — ABNORMAL HIGH (ref 135–145)
Sodium: 147 mmol/L — ABNORMAL HIGH (ref 135–145)

## 2019-05-30 LAB — PROTEIN / CREATININE RATIO, URINE
Creatinine, Urine: 119.12 mg/dL
Protein Creatinine Ratio: 0.86 mg/mg{Cre} — ABNORMAL HIGH (ref 0.00–0.15)
Total Protein, Urine: 103 mg/dL

## 2019-05-30 LAB — GLUCOSE, CAPILLARY
Glucose-Capillary: 100 mg/dL — ABNORMAL HIGH (ref 70–99)
Glucose-Capillary: 103 mg/dL — ABNORMAL HIGH (ref 70–99)
Glucose-Capillary: 103 mg/dL — ABNORMAL HIGH (ref 70–99)
Glucose-Capillary: 108 mg/dL — ABNORMAL HIGH (ref 70–99)
Glucose-Capillary: 108 mg/dL — ABNORMAL HIGH (ref 70–99)
Glucose-Capillary: 112 mg/dL — ABNORMAL HIGH (ref 70–99)
Glucose-Capillary: 113 mg/dL — ABNORMAL HIGH (ref 70–99)
Glucose-Capillary: 113 mg/dL — ABNORMAL HIGH (ref 70–99)
Glucose-Capillary: 115 mg/dL — ABNORMAL HIGH (ref 70–99)
Glucose-Capillary: 115 mg/dL — ABNORMAL HIGH (ref 70–99)
Glucose-Capillary: 115 mg/dL — ABNORMAL HIGH (ref 70–99)
Glucose-Capillary: 116 mg/dL — ABNORMAL HIGH (ref 70–99)
Glucose-Capillary: 120 mg/dL — ABNORMAL HIGH (ref 70–99)
Glucose-Capillary: 121 mg/dL — ABNORMAL HIGH (ref 70–99)
Glucose-Capillary: 121 mg/dL — ABNORMAL HIGH (ref 70–99)
Glucose-Capillary: 121 mg/dL — ABNORMAL HIGH (ref 70–99)
Glucose-Capillary: 122 mg/dL — ABNORMAL HIGH (ref 70–99)
Glucose-Capillary: 122 mg/dL — ABNORMAL HIGH (ref 70–99)
Glucose-Capillary: 123 mg/dL — ABNORMAL HIGH (ref 70–99)
Glucose-Capillary: 126 mg/dL — ABNORMAL HIGH (ref 70–99)
Glucose-Capillary: 133 mg/dL — ABNORMAL HIGH (ref 70–99)
Glucose-Capillary: 134 mg/dL — ABNORMAL HIGH (ref 70–99)
Glucose-Capillary: 95 mg/dL (ref 70–99)

## 2019-05-30 LAB — FERRITIN: Ferritin: 63 ng/mL (ref 11–307)

## 2019-05-30 LAB — BETA-HYDROXYBUTYRIC ACID
Beta-Hydroxybutyric Acid: 0.12 mmol/L (ref 0.05–0.27)
Beta-Hydroxybutyric Acid: 0.09 mmol/L (ref 0.05–0.27)

## 2019-05-30 LAB — CULTURE, OB URINE: Culture: <10000 — AB

## 2019-05-30 LAB — COMPREHENSIVE METABOLIC PANEL
ALT: 51 U/L — ABNORMAL HIGH (ref 0–44)
AST: 51 U/L — ABNORMAL HIGH (ref 15–41)
Albumin: 2.1 g/dL — ABNORMAL LOW (ref 3.5–5.0)
Alkaline Phosphatase: 99 U/L (ref 38–126)
Anion gap: 9 (ref 5–15)
BUN: 9 mg/dL (ref 6–20)
CO2: 14 mmol/L — ABNORMAL LOW (ref 22–32)
Calcium: 10.5 mg/dL — ABNORMAL HIGH (ref 8.9–10.3)
Chloride: 127 mmol/L — ABNORMAL HIGH (ref 98–111)
Creatinine, Ser: 1.03 mg/dL — ABNORMAL HIGH (ref 0.44–1.00)
GFR calc Af Amer: >60 mL/min (ref >60)
GFR calc non Af Amer: >60 mL/min (ref >60)
Glucose, Bld: 132 mg/dL — ABNORMAL HIGH (ref 70–99)
Potassium: 3.4 mmol/L — ABNORMAL LOW (ref 3.5–5.1)
Sodium: 150 mmol/L — ABNORMAL HIGH (ref 135–145)
Total Bilirubin: 0.9 mg/dL (ref 0.3–1.2)
Total Protein: 5.8 g/dL — ABNORMAL LOW (ref 6.5–8.1)

## 2019-05-30 LAB — FIBRINOGEN: Fibrinogen: 489 mg/dL — ABNORMAL HIGH (ref 210–475)

## 2019-05-30 LAB — T4, FREE: Free T4: 0.93 ng/dL (ref 0.61–1.12)

## 2019-05-30 LAB — TSH: TSH: 0.123 u[IU]/mL — ABNORMAL LOW (ref 0.350–4.500)

## 2019-05-30 LAB — MAGNESIUM: Magnesium: 1.7 mg/dL (ref 1.7–2.4)

## 2019-05-30 LAB — C-REACTIVE PROTEIN: CRP: 4.1 mg/dL — ABNORMAL HIGH (ref <1.0)

## 2019-05-30 MED ORDER — POTASSIUM CHLORIDE CRYS ER 20 MEQ PO TBCR
20.0000 meq | EXTENDED_RELEASE_TABLET | Freq: Once | ORAL | Status: AC
  Administered 2019-05-30: 20 meq via ORAL
  Filled 2019-05-30: qty 1

## 2019-05-30 MED ORDER — GENERIC EXTERNAL MEDICATION
Status: DC
Start: ? — End: 2019-05-30

## 2019-05-30 MED ORDER — POTASSIUM CHLORIDE CRYS ER 20 MEQ PO TBCR
40.0000 meq | EXTENDED_RELEASE_TABLET | Freq: Once | ORAL | Status: AC
  Administered 2019-05-30: 40 meq via ORAL
  Filled 2019-05-30: qty 2

## 2019-05-30 MED ORDER — ENOXAPARIN SODIUM 60 MG/0.6ML ~~LOC~~ SOLN
60.0000 mg | SUBCUTANEOUS | Status: DC
  Administered 2019-05-30 – 2019-06-07 (×5): 60 mg via SUBCUTANEOUS
  Filled 2019-05-30 (×6): qty 0.6

## 2019-05-30 MED ORDER — CLOTRIMAZOLE 1 % VA CREA
1.0000 | TOPICAL_CREAM | Freq: Every day | VAGINAL | Status: AC
  Administered 2019-05-30 – 2019-06-05 (×7): 1 via VAGINAL
  Filled 2019-05-30 (×2): qty 45

## 2019-05-30 MED ORDER — POTASSIUM CHLORIDE 10 MEQ/100ML IV SOLN
10.0000 meq | INTRAVENOUS | Status: AC
  Administered 2019-05-30 – 2019-05-31 (×4): 10 meq via INTRAVENOUS
  Filled 2019-05-30 (×4): qty 100

## 2019-05-30 NOTE — Progress Notes (Signed)
Pt refusing PO potassium.  Tremor noticed upon assessment to have progressed throughout the shift.  Pt not as responsive to Rn questions/assessments.   Adult rapid response called for consult.  RR RN to see patient.

## 2019-05-30 NOTE — Progress Notes (Addendum)
Patient with episodes of tachycardia on telemetry, telemetry has been reviewed, appears to be having 3 beats of NSVT  been followed by a short burst of SVT, happened couple times over last 48 hours, will check potassium ( goal > 4), and  Magnesium (goal >2), will obtain 2D echo, TSH and free T4 and continue with telemetry monitoring. Huey Bienenstock MD

## 2019-05-30 NOTE — Progress Notes (Signed)
Subjective: Patient has no complaints this morning.  She denies pain and reports active FM.  No HA, CP/SOB, RUQ pain, or visual disturbance.   Objective: I have reviewed patient's vital signs, intake and output, medications and labs.  Vitals:   05/30/19 0800 05/30/19 0900  BP: (!) 148/91 (!) 148/93  Pulse: (!) 103 99  Resp:    Temp:    SpO2:     Cat 2 fetal monitoring but no more recurrent late decelerations with DKA treatment Flat toco  General: cooperative, appears stated age and somnolent but responsive to questions Extremities: edema 1+ Abd: soft, NT   Assessment/Plan: 23yo G1 at [redacted]w[redacted]d admitted with DKA believed to be precipitated by COVID (dx 4/27) -COVID-Patient is not requiring supplemental O2.  No cough, etc.  On Remdesivir. -DKA-Improving glycemic control.  UOP 150 cc over the last 2 hours.  Patient is up 2L; foley in and will continue to monitor.  Labs improving.  Appreciate management by hospitalist.   -Fetal status-continuous monitoring improved.  No more late declerations.  Holding steroids for FLM given improvement in status and in attempt to avoid worsening glycemia.  Reassess BPP today (yesterday 4/8).  MFM following.  -LMWH for DVT ppx    LOS: 1 day    Kelli Mcpherson 05/30/2019, 9:30 AM

## 2019-05-30 NOTE — Progress Notes (Signed)
Rn assisted pt up to restroom to dc foley.  Old blood noted on bed pad.  Pt stated bleeding from perineal area prior to admission.  Dr Langston Masker notified and en route

## 2019-05-30 NOTE — Progress Notes (Signed)
RN noticed light vaginal bleeding and labial edema.  In to evaluate.  Scant dark brown blood on pad.  Copious thick white discharge noted with left > right labial edema.  Will treat with terazol 7.    Mitchel Honour, DO

## 2019-05-30 NOTE — Progress Notes (Addendum)
RN had drawn blood for morning lab draws from midline @ 0445. RN received call from lab concerning blood needing to be redrawn due to questionable results. Phlebotomist notified @0515  to please come to bedside to draw labs. RN called phlebotomist an additional time @ [phone] to request bedside blood draw.

## 2019-05-30 NOTE — Progress Notes (Signed)
Inpatient Diabetes Program Recommendations  ADA Standards of Care 2021 Diabetes in Pregnancy Target Glucose Ranges:  Fasting: 60 - 90 mg/dL Preprandial: 60 - 962 mg/dL 1 hr postprandial: Less than 140mg /dL (from first bite of meal) 2 hr postprandial: Less than 120 mg/dL (from first bit of meal)   Lab Results  Component Value Date   GLUCAP 121 (H) 05/30/2019   HGBA1C 7.3 (H) 05/29/2019    Review of Glycemic Control Results for Kelli Mcpherson, Kelli Mcpherson" (MRN [place]) as of 05/30/2019 09:26  Ref. Range 05/30/2019 07:04  Sodium Latest Ref Range: 135 - 145 mmol/L 150 (H)  Potassium Latest Ref Range: 3.5 - 5.1 mmol/L 3.4 (L)  Chloride Latest Ref Range: 98 - 111 mmol/L 127 (H)  CO2 Latest Ref Range: 22 - 32 mmol/L 14 (L)  Glucose Latest Ref Range: 70 - 99 mg/dL 07/30/2019 (H)  BUN Latest Ref Range: 6 - 20 mg/dL 9  Creatinine Latest Ref Range: 0.44 - 1.00 mg/dL 194 (H)  Calcium Latest Ref Range: 8.9 - 10.3 mg/dL 1.74 (H)  Anion gap Latest Ref Range: 5 - 15  9  Alkaline Phosphatase Latest Ref Range: 38 - 126 U/L 99  Albumin Latest Ref Range: 3.5 - 5.0 g/dL 2.1 (L)  AST Latest Ref Range: 15 - 41 U/L 51 (H)  ALT Latest Ref Range: 0 - 44 U/L 51 (H)   Diabetes history: GDM Outpatient Diabetes medications: Metformin Current orders for Inpatient glycemic control: Endotool/DKA in preg  COVID + and DKA (31 weeks)  Inpatient Diabetes Program Recommendations:    Continue IV insulin for now. Fetal monitoring. Goal 90-120 mg/dl.  Will anticipate the need for insulin at time of transition based on current insulin gtt rates 8-11 units/hour in the last 4 hours.  Thanks,  08.1 RN, MSN, BC-ADM Inpatient Diabetes Coordinator Team Pager [phone] (8a-5p)

## 2019-05-30 NOTE — Progress Notes (Signed)
PROGRESS NOTE                                                                                                                                                                                                             Patient Demographics:    Kelli Mcpherson, is a 24 y.o. female, DOB - 11/28/1995, ZOX:[id number]  Admit date - 05/29/2019   Admitting Physician Jonah BlueJennifer Yates, MD  Outpatient Primary MD for the patient is Ranae PilaLeger, Elise Jennifer, MD  LOS - 1   No chief complaint on file.      Brief Narrative    24 y.o. female with medical history significant of GC/Chl; and gestational DM with current pregnancy [facility] after being diagnosed with COVID-19 infection on 4/27.  She was seen in the ER here on 4/27 after COVID exposure (her brother); she had had emesis for the first time during her pregnancy at that time and tested positive for COVID.  She returned on 5/2 with SOB, n/v; she was evaluated by [facility] and was discharged with CO2 15, thought to be due to dehydration.    She returned with fever on 5/4; CO2 was 8, she was given IVF and tolerated PO and so was discharged.  Urine culture grew out lactobacillus which was not treated.  [facility] on 5/8 due to complaints of nausea and vomiting, she was diagnosed with DKA, she was noted to be hypertensive, tachycardic, elevated anion gap, [facility] due to lack of OB/GYN service.    Subjective:    Kelli ParmaShaquasia Sole today reports some nausea and vomiting to the nursing staff earlier today which should not want to eat, by the time I saw her reports she is feeling better, would like to try some food , she denies any fever, shortness of breath, she does report some cough .   Assessment  & Plan :    Principal Problem:   COVID-19 affecting pregnancy in third trimester Active Problems:   Obesity   DKA (diabetic  ketoacidosis) (HCC)   Uncontrolled gestational diabetes mellitus with DKA. -Patient reports diagnosis of gestational diabetes, at the beginning of pregnancy, which she has been taking Metformin, reports over the last few days he stopped taking it secondary to persistent nausea and vomiting and unable to  tolerate p.o. intake. -DKA most likely provoked by not taking her Metformin, and nausea and vomiting. -Patient anion gap has closed, as well beta hydroxybutyric acid is normalizing, but given her unreliable oral intake so far, will continue with insulin drip, and when she tolerated her meals she will be transitioned to long-acting Lantus, and sliding scale, as Metformin by itself does not seem to be sufficient at this point. -Diabetic coordinator has been consulted. -Her nausea and vomiting likely  related to COVID-19 infection, as well possibly due to pregnancy.  Hypernatremia -related to volume depletion and dehydration on presentation, was a fluid resuscitated with normal saline, for now we will continue with D5 half-normal saline, and can change to D5W if remains elevated  COVID-19 pneumonia/infection -Imaging taken for by lateral multifocal opacity, she is a started on remdesivir, and so far no hypoxia, so no indication for steroids. -Procalcitonin 0.23, no indication for antibiotics.  32 week IUP -Patient with reportedly normal pregnancy complicated by recent COVID-19 infection, diagnosed on 4/27 . -She was initially placed on 5W but given active 3rd trimester pregnancy, will transfer to the Antepartum area -Management per OB service, reviewing the notes, fetal status continuous monitoring improved, no more late decelerations.  Obesity Body mass index is 36.84 kg/m. -Will need encouragement about weight loss post-partum   COVID-19 Labs  Recent Labs    05/29/19 1247 05/30/19 0704  DDIMER 1.32*  --   FERRITIN 63 63  LDH 252*  --   CRP 4.5* 4.1*    Lab Results  Component  Value Date   SARSCOV2NAA POSITIVE (A) 05/17/2019     Code Status : Full  Disposition Plan  :  Status is: Inpatient  Remains inpatient appropriate because:IV treatments appropriate due to intensity of illness or inability to take PO   Dispo: The patient is from: Home              Anticipated d/c is to: Home              Anticipated d/c date is: 2 days              Patient currently is not medically stable to d/c.   DVT Prophylaxis  : [place] lovenox    Thank you for this consult, will keep following patient during hospital stay.   Lab Results  Component Value Date   PLT 233 05/30/2019    Antibiotics  :    Anti-infectives (From admission, onward)   Start     Dose/Rate Route Frequency Ordered Stop   05/30/19 1000  remdesivir 100 mg in sodium chloride 0.9 % 100 mL IVPB  Status:  Discontinued     100 mg 200 mL/hr over 30 Minutes Intravenous Daily 05/29/19 1345 05/29/19 1347   05/30/19 1000  remdesivir 100 mg in sodium chloride 0.9 % 100 mL IVPB     100 mg 200 mL/hr over 30 Minutes Intravenous Daily 05/29/19 1348 06/03/19 0959   05/29/19 1400  remdesivir 200 mg in sodium chloride 0.9% 250 mL IVPB     200 mg 580 mL/hr over 30 Minutes Intravenous Once 05/29/19 1348 05/29/19 1602   05/29/19 1345  remdesivir 200 mg in sodium chloride 0.9% 250 mL IVPB  Status:  Discontinued     200 mg 580 mL/hr over 30 Minutes Intravenous Once 05/29/19 1345 05/29/19 1347        Objective:   Vitals:   05/30/19 0730 05/30/19 0800 05/30/19 0900 05/30/19 1000  BP:  (!) 148/91 Marland Kitchen)  148/93 (!) 142/79  Pulse:  (!) 103 99 92  Resp: (!) 22     Temp:      TempSrc:      SpO2:      Weight:      Height:        Wt Readings from Last 3 Encounters:  05/29/19 123.2 kg  05/21/19 (!) 136.5 kg  05/18/19 (!) 136.5 kg     Intake/Output Summary (Last 24 hours) at 05/30/2019 1037 Last data filed at 05/30/2019 0929 Gross per 24 hour  Intake 4285.21 ml  Output 2020 ml  Net 2265.21 ml     Physical  Exam  Awake Alert, Oriented X 3, No new F.N deficits, Normal affect Symmetrical Chest wall movement, Good air movement bilaterally, CTAB RRR,No Gallops,Rubs or new Murmurs, No Parasternal Heave +ve B.Sounds, Abd Soft. No Cyanosis, Clubbing or edema, No new Rash or bruise      Data Review:    CBC Recent Labs  Lab 05/24/19 1904 05/29/19 1247 05/30/19 0704  WBC 7.7 5.8 6.4  HGB 13.0 11.6* 11.5*  HCT 41.7 35.5* 35.4*  PLT 202 266 233  MCV 90.5 85.5 86.1  MCH 28.2 28.0 28.0  MCHC 31.2 32.7 32.5  RDW 13.6 13.5 13.6  LYMPHSABS 0.8 0.8 1.0  MONOABS 0.5 0.3 0.4  EOSABS 0.0 0.0 0.1  BASOSABS 0.0 0.0 0.0    Chemistries  Recent Labs  Lab 05/24/19 1904 05/24/19 1904 05/29/19 1247 05/29/19 1718 05/29/19 2107 05/30/19 0040 05/30/19 0704  NA 137   < > 147* 148* 148* 151* 150*  K 3.9   < > 4.1 3.6 3.2* 3.6 3.4*  CL 112*   < > 123* 123* 125* 127* 127*  CO2 8*   < > 11* 15* 13* 16* 14*  GLUCOSE 155*   < > 232* 233* 188* 134* 132*  BUN <5*   < > 12 13 12 9 9   CREATININE 1.00   < > 0.96 0.95 0.92 0.97 1.03*  CALCIUM 10.0   < > 10.2 10.1 10.2 10.1 10.5*  MG  --   --  2.0  --   --   --   --   AST 26  --  44*  --   --   --  51*  ALT 17  --  51*  --   --   --  51*  ALKPHOS 110  --  100  --   --   --  99  BILITOT 0.8  --  1.2  --   --   --  0.9   < > = values in this interval not displayed.   ------------------------------------------------------------------------------------------------------------------ No results for input(s): CHOL, HDL, LDLCALC, TRIG, CHOLHDL, LDLDIRECT in the last 72 hours.  Lab Results  Component Value Date   HGBA1C 7.3 (H) 05/29/2019   ------------------------------------------------------------------------------------------------------------------ No results for input(s): TSH, T4TOTAL, T3FREE, THYROIDAB in the last 72 hours.  Invalid input(s):  FREET3 ------------------------------------------------------------------------------------------------------------------ Recent Labs    05/29/19 1247 05/30/19 0704  FERRITIN 63 63    Coagulation profile No results for input(s): INR, PROTIME in the last 168 hours.  Recent Labs    05/29/19 1247  DDIMER 1.32*    Cardiac Enzymes No results for input(s): CKMB, TROPONINI, MYOGLOBIN in the last 168 hours.  Invalid input(s): CK ------------------------------------------------------------------------------------------------------------------    Component Value Date/Time   BNP 26.1 05/29/2019 1247    Inpatient Medications  Scheduled Meds: . albuterol  2 puff Inhalation Q6H  . docusate sodium  100 mg Oral Daily  . enoxaparin (LOVENOX) injection  60 mg Subcutaneous Q24H  . prenatal multivitamin  1 tablet Oral Q1200  . sodium chloride flush  10-40 mL Intracatheter Q12H  . sodium chloride flush  3 mL Intravenous Q12H   Continuous Infusions: . dextrose 5 % and 0.45% NaCl 125 mL/hr at 05/30/19 0929  . insulin 8 Units/hr (05/30/19 0929)  . remdesivir 100 mg in NS 100 mL 100 mg (05/30/19 0936)   PRN Meds:.[DISCONTINUED] acetaminophen **OR** acetaminophen, acetaminophen, bisacodyl, calcium carbonate, dextrose, ondansetron **OR** ondansetron (ZOFRAN) IV, polyethylene glycol, promethazine, sodium chloride flush, zolpidem  Micro Results Recent Results (from the past 240 hour(s))  Urine culture     Status: Abnormal   Collection Time: 05/24/19  9:27 PM   Specimen: Urine, Random  Result Value Ref Range Status   Specimen Description URINE, RANDOM  Final   Special Requests NONE  Final   Culture (A)  Final    30,000 COLONIES/mL LACTOBACILLUS SPECIES Standardized susceptibility testing for this organism is not available. Performed at [facility], [address] [place], [place] [address]    Report Status 05/25/2019 FINAL  Final  OB Urine Culture     Status: Abnormal    Collection Time: 05/29/19 12:08 PM   Specimen: OB Clean Catch; Urine  Result Value Ref Range Status   Specimen Description OB CLEAN CATCH  Final   Special Requests NONE  Final   Culture (A)  Final    <10,000 COLONIES/mL INSIGNIFICANT GROWTH NO GROUP B STREP (S.AGALACTIAE) ISOLATED Performed at [facility], [address] [place], [place] [address]    Report Status 05/30/2019 FINAL  Final    Radiology Reports DG CHEST PORT 1 VIEW  Result Date: 05/29/2019 CLINICAL DATA:  Shortness of breath and cough. COVID-19 positive. EXAM: PORTABLE CHEST 1 VIEW COMPARISON:  May 22, 2019 FINDINGS: Cardiomediastinal silhouette is mildly enlarged. Interval development of patchy bilateral airspace opacities with lower lobe predominance. Osseous structures are without acute abnormality. Soft tissues are grossly normal. IMPRESSION: 1. Interval development of patchy bilateral airspace opacities with lower lobe predominance consistent with atypical pneumonia. 2. Enlarged cardiac silhouette. Electronically Signed   By: Ted Mcalpine M.D.   On: 05/29/2019 12:13   DG Chest Port 1 View  Result Date: 05/22/2019 CLINICAL DATA:  Shortness of breath.  COVID-19. EXAM: PORTABLE CHEST 1 VIEW COMPARISON:  08/07/2018 FINDINGS: The heart size and mediastinal contours are within normal limits. Both lungs are clear. The visualized skeletal structures are unremarkable. IMPRESSION: Clear lungs. Electronically Signed   By: Deatra Robinson M.D.   On: 05/22/2019 02:41   [place] MFM FETAL BPP WO NON STRESS  Result Date: 05/29/2019 ----------------------------------------------------------------------  OBSTETRICS REPORT                       (Signed Final 05/29/2019 12:15 pm) ---------------------------------------------------------------------- Patient Info  ID #:       [id number]                          D.O.B.:  [birth year] (23 yrs)  Name:       Kelli Mcpherson              Visit Date: 05/29/2019 09:58 am  ---------------------------------------------------------------------- Performed By  Attending:        Lin Landsman      Ref. Address:      [facility]                                                              [facility]                                                              [place]                                                              [place]  Performed By:     Wilnette Kales        Location:          [facility]                                  [place]  Referred By:      Louretta Shorten MD ---------------------------------------------------------------------- Orders  #  Description                           Code        Ordered By  1  [place] MFM FETAL BPP WO NON               69629.52    DAVID LOWE     STRESS ----------------------------------------------------------------------  #  Order #                     Accession #                Episode #  1  [id number]                   [id number]                 [id number] ---------------------------------------------------------------------- Indications  Gestational diabetes in pregnancy,              O24.415  controlled by oral hypoglycemic drugs, DKA  Obesity complicating pregnancy,  third           O99.213  trimester  Unspecified maternal hypertension, third        O16.3  trimester  COVID19 (+)  (4/27)  Oligohydraminios, third trimester, unspecified  O41.03X0  [redacted] weeks gestation of pregnancy                 Z3A.31 ---------------------------------------------------------------------- Fetal Evaluation  Num Of Fetuses:          1  Fetal Heart Rate(bpm):   150  Cardiac Activity:        Observed  Presentation:            Cephalic  Placenta:                Anterior  Amniotic Fluid  AFI FV:      Within normal limits  AFI Sum(cm)     %Tile       Largest  Pocket(cm)  9.5             12          2.8  RUQ(cm)       RLQ(cm)       LUQ(cm)        LLQ(cm)  2.8           2.5           2.8            1.4 ---------------------------------------------------------------------- Biophysical Evaluation  Amniotic F.V:   Within normal limits       F. Tone:         Observed  F. Movement:    Not Observed               Score:           4/8  F. Breathing:   Not Observed ---------------------------------------------------------------------- OB History  Gravidity:    1 ---------------------------------------------------------------------- Gestational Age  LMP:           31w 6d        Date:  10/18/18                 EDD:   07/25/19  Best:          31w 6d     Det. By:  LMP  (10/18/18)          EDD:   07/25/19 ---------------------------------------------------------------------- Anatomy  Stomach:               Appears normal, left   Kidneys:                Appear normal                         sided  Abdominal Wall:        Appears nml (cord      Bladder:                Appears normal                         insert, abd wall) ---------------------------------------------------------------------- Impression  Limited exam due to presumed maternal DKA  Biophysical profile 4 out of 8 there is some movement but not  enough to pass the BPP.  Labs are pending with continued fetal monitoring.  I discussed with Dr. Rana Snare the current plan and agree. ---------------------------------------------------------------------- Recommendations  Continueed Fetal monitoring  Hold Betamethsone  DKA protocol including insulin drip  and fluid hydration with  the goal of blood sugars 100-140's.  Will await labs to perform consultation and diagnosis of DKA.  Continue COVID protocol. ----------------------------------------------------------------------               Lin Landsman, MD Electronically Signed Final Report   05/29/2019 12:15 pm  ----------------------------------------------------------------------    Huey Bienenstock M.D on 05/30/2019 at 10:37 AM  Between 7am to 7pm - Pager - [phone]  After 7pm go to [email or web address] - password [facility]  [facility] -  Office  [phone]

## 2019-05-30 NOTE — Consult Note (Signed)
MFM Note  This patient has been hospitalized due to diabetic ketoacidosis and persistent nausea and vomiting.  She also tested positive for COVID-19 about 2 weeks ago.  She is currently being treated with remdesivir for Covid pneumonia.  She has been receiving IV hydration and IV insulin to help resolve the diabetic ketoacidosis.    This morning, her fetal heart rate tracing has improved.  There are periods of variability and accelerations.  No further decelerations are noted.  The patient is also more alert and awake.  Her labs show hypernatremia with a sodium level of 150, serum creatinine of 1.03, and AST and ALT that are mildly elevated (51).  Her fingerstick values are now in the 110s to 120s range.  Her blood pressures remain in the 140s over 80s range.  A biophysical profile performed today was 4 out of 8.  The patient received -2 for absent fetal movements and also -2 for anhydramnios.    I reviewed the patient's ultrasound images from yesterday.  A >2 cm pocket of amniotic fluid was noted yesterday.  Due to her symptoms of persistent nausea and vomiting, her elevated blood pressures, her elevated liver function tests, and her elevated serum creatinine level, a diagnosis of preeclampsia has not been ruled out.  A P/C ratio performed this afternoon was 0.86 (elevated).  A 24-hour urine collection is underway to assess the total protein.  A course of antenatal corticosteroids for fetal lung maturity has been held due to concerns that it may elevate her blood glucose levels and further worsen her diabetic ketoacidosis.  The patient should be kept on continuous monitoring overnight.  I would recommend delivery should the fetal status be nonreassuring.  Should her diabetic ketoacidosis continue to resolve and her blood pressures remain elevated therefore increasing the suspicion for preeclampsia, a course of antenatal corticosteroids should be administered tomorrow morning.  The patient will  continue to receive IV hydration overnight.  We will repeat another ultrasound tomorrow to assess the amniotic fluid levels, the fetal growth, and umbilical artery Doppler studies.

## 2019-05-30 NOTE — Progress Notes (Signed)
Multiple attempts by RN and phlebotomy to draw labs this afternoon.  Blood sample lost in lab.  Multiple unsuccessful attempts by phlebotomy to obtain new labs.

## 2019-05-31 ENCOUNTER — Inpatient Hospital Stay (HOSPITAL_COMMUNITY): Payer: Managed Care, Other (non HMO)

## 2019-05-31 ENCOUNTER — Inpatient Hospital Stay (HOSPITAL_BASED_OUTPATIENT_CLINIC_OR_DEPARTMENT_OTHER): Payer: Managed Care, Other (non HMO)

## 2019-05-31 ENCOUNTER — Inpatient Hospital Stay: Payer: Self-pay

## 2019-05-31 DIAGNOSIS — I471 Supraventricular tachycardia: Secondary | ICD-10-CM

## 2019-05-31 DIAGNOSIS — Z3A32 32 weeks gestation of pregnancy: Secondary | ICD-10-CM | POA: Diagnosis not present

## 2019-05-31 DIAGNOSIS — Z363 Encounter for antenatal screening for malformations: Secondary | ICD-10-CM | POA: Diagnosis not present

## 2019-05-31 DIAGNOSIS — U071 COVID-19: Secondary | ICD-10-CM | POA: Diagnosis not present

## 2019-05-31 DIAGNOSIS — O98513 Other viral diseases complicating pregnancy, third trimester: Secondary | ICD-10-CM | POA: Diagnosis not present

## 2019-05-31 DIAGNOSIS — O4103X0 Oligohydramnios, third trimester, not applicable or unspecified: Secondary | ICD-10-CM

## 2019-05-31 DIAGNOSIS — O24415 Gestational diabetes mellitus in pregnancy, controlled by oral hypoglycemic drugs: Secondary | ICD-10-CM | POA: Diagnosis not present

## 2019-05-31 LAB — BASIC METABOLIC PANEL
Anion gap: 12 (ref 5–15)
BUN: 9 mg/dL (ref 6–20)
CO2: 19 mmol/L — ABNORMAL LOW (ref 22–32)
Calcium: 10 mg/dL (ref 8.9–10.3)
Chloride: 115 mmol/L — ABNORMAL HIGH (ref 98–111)
Creatinine, Ser: 0.89 mg/dL (ref 0.44–1.00)
GFR calc Af Amer: >60 mL/min (ref >60)
GFR calc non Af Amer: >60 mL/min (ref >60)
Glucose, Bld: 113 mg/dL — ABNORMAL HIGH (ref 70–99)
Potassium: 3.5 mmol/L (ref 3.5–5.1)
Sodium: 146 mmol/L — ABNORMAL HIGH (ref 135–145)

## 2019-05-31 LAB — CBC WITH DIFFERENTIAL/PLATELET
Abs Immature Granulocytes: 0.22 10*3/uL — ABNORMAL HIGH (ref 0.00–0.07)
Basophils Absolute: 0 10*3/uL (ref 0.0–0.1)
Basophils Relative: 1 %
Eosinophils Absolute: 0.1 10*3/uL (ref 0.0–0.5)
Eosinophils Relative: 1 %
HCT: 36.9 % (ref 36.0–46.0)
Hemoglobin: 11.9 g/dL — ABNORMAL LOW (ref 12.0–15.0)
Immature Granulocytes: 3 %
Lymphocytes Relative: 15 %
Lymphs Abs: 1.1 10*3/uL (ref 0.7–4.0)
MCH: 27.6 pg (ref 26.0–34.0)
MCHC: 32.2 g/dL (ref 30.0–36.0)
MCV: 85.6 fL (ref 80.0–100.0)
Monocytes Absolute: 0.6 10*3/uL (ref 0.1–1.0)
Monocytes Relative: 8 %
Neutro Abs: 5.3 10*3/uL (ref 1.7–7.7)
Neutrophils Relative %: 72 %
Platelets: 194 10*3/uL (ref 150–400)
RBC: 4.31 MIL/uL (ref 3.87–5.11)
RDW: 13.6 % (ref 11.5–15.5)
WBC: 7.3 10*3/uL (ref 4.0–10.5)
nRBC: 6.2 % — ABNORMAL HIGH (ref 0.0–0.2)

## 2019-05-31 LAB — GLUCOSE, CAPILLARY
Glucose-Capillary: 112 mg/dL — ABNORMAL HIGH (ref 70–99)
Glucose-Capillary: 90 mg/dL (ref 70–99)
Glucose-Capillary: 115 mg/dL — ABNORMAL HIGH (ref 70–99)
Glucose-Capillary: 110 mg/dL — ABNORMAL HIGH (ref 70–99)
Glucose-Capillary: 100 mg/dL — ABNORMAL HIGH (ref 70–99)
Glucose-Capillary: 135 mg/dL — ABNORMAL HIGH (ref 70–99)
Glucose-Capillary: 127 mg/dL — ABNORMAL HIGH (ref 70–99)
Glucose-Capillary: 109 mg/dL — ABNORMAL HIGH (ref 70–99)
Glucose-Capillary: 102 mg/dL — ABNORMAL HIGH (ref 70–99)
Glucose-Capillary: 101 mg/dL — ABNORMAL HIGH (ref 70–99)
Glucose-Capillary: 105 mg/dL — ABNORMAL HIGH (ref 70–99)
Glucose-Capillary: 152 mg/dL — ABNORMAL HIGH (ref 70–99)
Glucose-Capillary: 181 mg/dL — ABNORMAL HIGH (ref 70–99)
Glucose-Capillary: 132 mg/dL — ABNORMAL HIGH (ref 70–99)
Glucose-Capillary: 90 mg/dL (ref 70–99)

## 2019-05-31 LAB — ECHOCARDIOGRAM COMPLETE
Height: 72 in
Weight: 4345.71 oz

## 2019-05-31 LAB — COMPREHENSIVE METABOLIC PANEL
ALT: 51 U/L — ABNORMAL HIGH (ref 0–44)
AST: 48 U/L — ABNORMAL HIGH (ref 15–41)
Albumin: 2.1 g/dL — ABNORMAL LOW (ref 3.5–5.0)
Alkaline Phosphatase: 105 U/L (ref 38–126)
Anion gap: 12 (ref 5–15)
BUN: 9 mg/dL (ref 6–20)
CO2: 15 mmol/L — ABNORMAL LOW (ref 22–32)
Calcium: 10 mg/dL (ref 8.9–10.3)
Chloride: 118 mmol/L — ABNORMAL HIGH (ref 98–111)
Creatinine, Ser: 0.97 mg/dL (ref 0.44–1.00)
GFR calc Af Amer: >60 mL/min (ref >60)
GFR calc non Af Amer: >60 mL/min (ref >60)
Glucose, Bld: 121 mg/dL — ABNORMAL HIGH (ref 70–99)
Potassium: 3.3 mmol/L — ABNORMAL LOW (ref 3.5–5.1)
Sodium: 145 mmol/L (ref 135–145)
Total Bilirubin: 0.9 mg/dL (ref 0.3–1.2)
Total Protein: 6.2 g/dL — ABNORMAL LOW (ref 6.5–8.1)

## 2019-05-31 LAB — TROPONIN I (HIGH SENSITIVITY): Troponin I (High Sensitivity): 35 ng/L — ABNORMAL HIGH (ref <18)

## 2019-05-31 LAB — PATHOLOGIST SMEAR REVIEW

## 2019-05-31 LAB — MAGNESIUM: Magnesium: 1.7 mg/dL (ref 1.7–2.4)

## 2019-05-31 LAB — PROTEIN, URINE, 24 HOUR
Collection Interval-UPROT: 24 hours
Protein, 24H Urine: 1220 mg/d — ABNORMAL HIGH (ref 50–100)
Protein, Urine: 122 mg/dL
Urine Total Volume-UPROT: 1000 mL

## 2019-05-31 LAB — BETA-HYDROXYBUTYRIC ACID: Beta-Hydroxybutyric Acid: 0.16 mmol/L (ref 0.05–0.27)

## 2019-05-31 LAB — C-REACTIVE PROTEIN: CRP: 5.4 mg/dL — ABNORMAL HIGH (ref <1.0)

## 2019-05-31 MED ORDER — SODIUM CHLORIDE 0.9% FLUSH
10.0000 mL | Freq: Two times a day (BID) | INTRAVENOUS | Status: DC
  Administered 2019-06-01 – 2019-06-08 (×12): 10 mL

## 2019-05-31 MED ORDER — MAGNESIUM SULFATE 2 GM/50ML IV SOLN
2.0000 g | Freq: Once | INTRAVENOUS | Status: AC
  Administered 2019-05-31: 11:00:00 2 g via INTRAVENOUS
  Filled 2019-05-31: qty 50

## 2019-05-31 MED ORDER — SODIUM CHLORIDE 0.9% FLUSH: 10.0000 mL | INTRAVENOUS | Status: DC | PRN

## 2019-05-31 MED ORDER — GENERIC EXTERNAL MEDICATION
Status: DC
Start: ? — End: 2019-05-31

## 2019-05-31 MED ORDER — CHLORHEXIDINE GLUCONATE CLOTH 2 % EX PADS
6.0000 | MEDICATED_PAD | Freq: Every day | CUTANEOUS | Status: DC
  Administered 2019-06-01 – 2019-06-07 (×7): 6 via TOPICAL

## 2019-05-31 MED ORDER — SODIUM BICARBONATE 8.4 % IV SOLN
INTRAVENOUS | Status: DC
  Filled 2019-05-31 (×3): qty 100

## 2019-05-31 MED ORDER — BETAMETHASONE SOD PHOS & ACET 6 (3-3) MG/ML IJ SUSP
12.0000 mg | INTRAMUSCULAR | Status: AC
  Administered 2019-06-01 – 2019-06-02 (×2): 12 mg via INTRAMUSCULAR
  Filled 2019-05-31: qty 5

## 2019-05-31 MED ORDER — POTASSIUM CHLORIDE 10 MEQ/100ML IV SOLN
10.0000 meq | INTRAVENOUS | Status: DC
  Administered 2019-05-31: 10 meq via INTRAVENOUS
  Filled 2019-05-31: qty 100

## 2019-05-31 MED ORDER — POTASSIUM CHLORIDE CRYS ER 20 MEQ PO TBCR
40.0000 meq | EXTENDED_RELEASE_TABLET | ORAL | Status: AC
  Administered 2019-05-31 (×2): 40 meq via ORAL
  Filled 2019-05-31 (×2): qty 2

## 2019-05-31 MED ORDER — ALBUMIN HUMAN 5 % IV SOLN
12.5000 g | Freq: Once | INTRAVENOUS | Status: DC
  Filled 2019-05-31: qty 250

## 2019-05-31 MED ORDER — DEXTROSE-NACL 5-0.45 % IV SOLN
INTRAVENOUS | Status: DC
  Administered 2019-06-02 – 2019-06-03 (×2): 125 mL/h via INTRAVENOUS

## 2019-05-31 NOTE — Progress Notes (Signed)
PROBLEM LIST: Hospital Day # 3  1 - IUP at 1 w 1 day 2 - DKA  3 - COVID + 4 - Probable Preeclampsia 5 - Morbid obesity  S:  Assumed care of this patient this am.  About to receive ECHOCARDIOGRAM. Currently in the bathroom. Drinking small amounts of water but having some nausea this morning.  O:  BP 133/68   Pulse 97   Temp 98.5 F (36.9 C) (Oral)   Resp 20   Ht 6' (1.829 m)   Wt 123.2 kg   LMP 07/31/2018 (Approximate) Comment: pt shielded  SpO2 95%   BMI 36.84 kg/m    Alert and oriented  Abdomen is soft and non tender  Results for orders placed or performed during the hospital encounter of 05/29/19 (from the past 24 hour(s))  Glucose, capillary     Status: Abnormal   Collection Time: 05/30/19  8:28 AM  Result Value Ref Range   Glucose-Capillary 121 (H) 70 - 99 mg/dL  Glucose, capillary     Status: Abnormal   Collection Time: 05/30/19  9:28 AM  Result Value Ref Range   Glucose-Capillary 115 (H) 70 - 99 mg/dL  Glucose, capillary     Status: Abnormal   Collection Time: 05/30/19 10:26 AM  Result Value Ref Range   Glucose-Capillary 115 (H) 70 - 99 mg/dL  Glucose, capillary     Status: Abnormal   Collection Time: 05/30/19 11:28 AM  Result Value Ref Range   Glucose-Capillary 108 (H) 70 - 99 mg/dL  Glucose, capillary     Status: Abnormal   Collection Time: 05/30/19 12:27 PM  Result Value Ref Range   Glucose-Capillary 116 (H) 70 - 99 mg/dL  Glucose, capillary     Status: Abnormal   Collection Time: 05/30/19  1:47 PM  Result Value Ref Range   Glucose-Capillary 121 (H) 70 - 99 mg/dL  Protein / creatinine ratio, urine     Status: Abnormal   Collection Time: 05/30/19  2:00 PM  Result Value Ref Range   Creatinine, Urine 119.12 mg/dL   Total Protein, Urine 103 mg/dL   Protein Creatinine Ratio 0.86 (H) 0.00 - 0.15 mg/mg[Cre]  Glucose, capillary     Status: Abnormal   Collection Time: 05/30/19  2:43 PM  Result Value Ref Range   Glucose-Capillary 122 (H) 70 - 99 mg/dL   Glucose, capillary     Status: Abnormal   Collection Time: 05/30/19  3:44 PM  Result Value Ref Range   Glucose-Capillary 112 (H) 70 - 99 mg/dL  Beta-hydroxybutyric acid     Status: None   Collection Time: 05/30/19  4:15 PM  Result Value Ref Range   Beta-Hydroxybutyric Acid 0.09 0.05 - 0.27 mmol/L  Glucose, capillary     Status: Abnormal   Collection Time: 05/30/19  4:43 PM  Result Value Ref Range   Glucose-Capillary 115 (H) 70 - 99 mg/dL  Glucose, capillary     Status: Abnormal   Collection Time: 05/30/19  5:44 PM  Result Value Ref Range   Glucose-Capillary 134 (H) 70 - 99 mg/dL  Glucose, capillary     Status: Abnormal   Collection Time: 05/30/19  6:47 PM  Result Value Ref Range   Glucose-Capillary 120 (H) 70 - 99 mg/dL  Glucose, capillary     Status: Abnormal   Collection Time: 05/30/19  7:42 PM  Result Value Ref Range   Glucose-Capillary 113 (H) 70 - 99 mg/dL  Basic metabolic panel     Status: Abnormal  Collection Time: 05/30/19  7:54 PM  Result Value Ref Range   Sodium 147 (H) 135 - 145 mmol/L   Potassium 3.4 (L) 3.5 - 5.1 mmol/L   Chloride 122 (H) 98 - 111 mmol/L   CO2 15 (L) 22 - 32 mmol/L   Glucose, Bld 116 (H) 70 - 99 mg/dL   BUN 10 6 - 20 mg/dL   Creatinine, Ser 0.93 0.44 - 1.00 mg/dL   Calcium 23.5 8.9 - 57.3 mg/dL   GFR calc non Af Amer >60 >60 mL/min   GFR calc Af Amer >60 >60 mL/min   Anion gap 10 5 - 15  Magnesium     Status: None   Collection Time: 05/30/19  7:54 PM  Result Value Ref Range   Magnesium 1.7 1.7 - 2.4 mg/dL  TSH     Status: Abnormal   Collection Time: 05/30/19  7:54 PM  Result Value Ref Range   TSH 0.123 (L) 0.350 - 4.500 uIU/mL  T4, free     Status: None   Collection Time: 05/30/19  7:54 PM  Result Value Ref Range   Free T4 0.93 0.61 - 1.12 ng/dL  Glucose, capillary     Status: None   Collection Time: 05/30/19  8:59 PM  Result Value Ref Range   Glucose-Capillary 95 70 - 99 mg/dL  Glucose, capillary     Status: Abnormal    Collection Time: 05/30/19  9:56 PM  Result Value Ref Range   Glucose-Capillary 103 (H) 70 - 99 mg/dL  Glucose, capillary     Status: Abnormal   Collection Time: 05/30/19 11:06 PM  Result Value Ref Range   Glucose-Capillary 100 (H) 70 - 99 mg/dL  Glucose, capillary     Status: Abnormal   Collection Time: 05/31/19  1:11 AM  Result Value Ref Range   Glucose-Capillary 112 (H) 70 - 99 mg/dL  Glucose, capillary     Status: None   Collection Time: 05/31/19  3:11 AM  Result Value Ref Range   Glucose-Capillary 90 70 - 99 mg/dL  CBC with Differential/Platelet     Status: Abnormal (Preliminary result)   Collection Time: 05/31/19  5:10 AM  Result Value Ref Range   WBC PENDING 4.0 - 10.5 K/uL   RBC 4.31 3.87 - 5.11 MIL/uL   Hemoglobin 11.9 (L) 12.0 - 15.0 g/dL   HCT 22.0 25.4 - 27.0 %   MCV 85.6 80.0 - 100.0 fL   MCH 27.6 26.0 - 34.0 pg   MCHC 32.2 30.0 - 36.0 g/dL   RDW 62.3 76.2 - 83.1 %   Platelets 194 150 - 400 K/uL   nRBC PENDING 0.0 - 0.2 %   Neutrophils Relative % PENDING %   Neutro Abs PENDING 1.7 - 7.7 K/uL   Band Neutrophils PENDING %   Lymphocytes Relative PENDING %   Lymphs Abs PENDING 0.7 - 4.0 K/uL   Monocytes Relative PENDING %   Monocytes Absolute PENDING 0.1 - 1.0 K/uL   Eosinophils Relative PENDING %   Eosinophils Absolute PENDING 0.0 - 0.5 K/uL   Basophils Relative PENDING %   Basophils Absolute PENDING 0.0 - 0.1 K/uL   WBC Morphology PENDING    RBC Morphology PENDING    Smear Review PENDING    Other PENDING %   nRBC PENDING 0 /100 WBC   Metamyelocytes Relative PENDING %   Myelocytes PENDING %   Promyelocytes Relative PENDING %   Blasts PENDING %   Immature Granulocytes PENDING %  Abs Immature Granulocytes PENDING 0.00 - 0.07 K/uL  Comprehensive metabolic panel     Status: Abnormal   Collection Time: 05/31/19  5:10 AM  Result Value Ref Range   Sodium 145 135 - 145 mmol/L   Potassium 3.3 (L) 3.5 - 5.1 mmol/L   Chloride 118 (H) 98 - 111 mmol/L   CO2 15 (L)  22 - 32 mmol/L   Glucose, Bld 121 (H) 70 - 99 mg/dL   BUN 9 6 - 20 mg/dL   Creatinine, Ser 1.93 0.44 - 1.00 mg/dL   Calcium 79.0 8.9 - 24.0 mg/dL   Total Protein 6.2 (L) 6.5 - 8.1 g/dL   Albumin 2.1 (L) 3.5 - 5.0 g/dL   AST 48 (H) 15 - 41 U/L   ALT 51 (H) 0 - 44 U/L   Alkaline Phosphatase 105 38 - 126 U/L   Total Bilirubin 0.9 0.3 - 1.2 mg/dL   GFR calc non Af Amer >60 >60 mL/min   GFR calc Af Amer >60 >60 mL/min   Anion gap 12 5 - 15  C-reactive protein     Status: Abnormal   Collection Time: 05/31/19  5:10 AM  Result Value Ref Range   CRP 5.4 (H) <1.0 mg/dL  Magnesium     Status: None   Collection Time: 05/31/19  5:10 AM  Result Value Ref Range   Magnesium 1.7 1.7 - 2.4 mg/dL  Glucose, capillary     Status: Abnormal   Collection Time: 05/31/19  5:14 AM  Result Value Ref Range   Glucose-Capillary 115 (H) 70 - 99 mg/dL  Glucose, capillary     Status: Abnormal   Collection Time: 05/31/19  7:26 AM  Result Value Ref Range   Glucose-Capillary 110 (H) 70 - 99 mg/dL   Scheduled Meds: . albuterol  2 puff Inhalation Q6H  . clotrimazole  1 Applicatorful Vaginal QHS  . docusate sodium  100 mg Oral Daily  . enoxaparin (LOVENOX) injection  60 mg Subcutaneous Q24H  . prenatal multivitamin  1 tablet Oral Q1200  . sodium chloride flush  10-40 mL Intracatheter Q12H  . sodium chloride flush  3 mL Intravenous Q12H   Continuous Infusions: . albumin human    . dextrose 5 % and 0.45% NaCl 125 mL/hr at 05/31/19 0610  . insulin 6.5 Units/hr (05/31/19 0728)  . magnesium sulfate bolus IVPB    . potassium chloride    . remdesivir 100 mg in NS 100 mL Stopped (05/30/19 1006)   PRN Meds:.[DISCONTINUED] acetaminophen **OR** acetaminophen, acetaminophen, bisacodyl, calcium carbonate, dextrose, ondansetron **OR** ondansetron (ZOFRAN) IV, polyethylene glycol, promethazine, sodium chloride flush, zolpidem   IMPRESSION: IUP at 32 weeks 1 day  - Concern over Repetitive late decelerations shortly  after admission  - those have resolved.  BPP 4/8 on admission and yesterday 4/8 with no  fluid.   MFM following closely with [place] - Reviewed yesterday's BPP and a greater than 2 cm pocket was noted.  They  recommended continuous monitoring and repeat BPP today.  DELIVERY is recommended should the fetal status be nonreassuring. Per MFM. Hold steroids because of DKA Growth scan today  -DKA   Labs improving  Continue albumin and insulin  Encourage patient to eat and drink  - COVID +(April 27) Completed 10 days of Remdesivir yesterday  - Probable preeclampsia  Noted hypertension and increased Protein to Cr Ratio 24 hour urine in progress per MFM

## 2019-05-31 NOTE — Progress Notes (Signed)
PICC tip confirmed by chest xray. Tip in good position per Dr. Karle Starch. RN informed that PICC is ready to be used.

## 2019-05-31 NOTE — Progress Notes (Signed)
PROGRESS NOTE                                                                                                                                                                                                             Patient Demographics:    Mcpherson Mcpherson, is a 24 y.o. female, DOB - 10/26/1995, WUJ:[id number]  Admit date - 05/29/2019   Admitting Physician Mcpherson Blue, MD  Outpatient Primary MD for the patient is Mcpherson Pila, MD  LOS - 2   No chief complaint on file.      Brief Narrative    24 y.o. female with medical history significant of GC/Chl; and gestational DM with current pregnancy presenting from [facility] with n/v after being diagnosed with COVID-19 infection on 4/27.  She was seen in the ER here on 4/27 after COVID exposure (her brother); she had had emesis for the first time during her pregnancy at that time and tested positive for COVID.  She returned on 5/2 with SOB, n/v; she was evaluated by [facility] and was discharged with CO2 15, thought to be due to dehydration.    She returned with fever on 5/4; CO2 was 8, she was given IVF and tolerated PO and so was discharged.  Urine culture grew out lactobacillus which was not treated.  Resents to [facility] on 5/8 due to complaints of nausea and vomiting, she was diagnosed with DKA, she was noted to be hypertensive, tachycardic, elevated anion gap, transfer was requested to [facility] due to lack of OB/GYN service.    Subjective:    Mcpherson Mcpherson today reports some nausea and vomiting to the nursing staff earlier today which should not want to eat, by the time I saw her reports she is feeling better, would like to try some food , she denies any fever, shortness of breath, she does report some cough .   Assessment  & Plan :    Principal Problem:   COVID-19 affecting pregnancy in third trimester Active Problems:   Obesity   DKA (diabetic  ketoacidosis) (HCC)   Metabolic acidosis secondary to gestational diabetes mellitus with DKA and starvation ketoacidosis. -patient reports diagnosis of gestational diabetes, on Metformin at the beginning of pregnancy, not taking for last few days secondary to nausea and vomiting . -Nausea and vomiting may be  related to her COVID-19 infection, DKA, and pregnancy as well. -Beta hydroxybutyric acid elevated on admission, with anion gap of 23, anion gap has closed, as well beta hydroxybutyric acid has normalized. -Her bicarb remains low, discussed with renal, will start on bicarb drip and will target Mcpherson Mcpherson level of 22. -She kept on insulin drip over last 24 hours despite DKA resolved given unpredictable oral intake, possible need steroids if delivery is expected, so we will continue with insulin drip for next 24 hours then reassess. - as well COVID 19 may be contributing to her hyperglycemia.  Hypernatremia -related to volume depletion and dehydration on presentation, resolved with IV fluids.  COVID-19 pneumonia/infection -Imaging taken for by lateral multifocal opacity, she is a started on remdesivir finish total of 5 days, today is day #3/5, and so far no hypoxia, so no indication for steroids. -Procalcitonin 0.23, no indication for antibiotics.  32 week IUP -Patient with reportedly normal pregnancy complicated by recent COVID-19 infection, diagnosed on 4/27 . -Management per OB service. -It is okay to proceed with steroids if it felt indicated, as her DKA has resolved and hyperglycemia can be controlled with insulin drip.  Arrhythmias -Patient with multiple events of NSVT followed by short burst of SVTand this morning, they are asymptomatic, 2D echo was obtained significant for EF 50 to 55%(is on the lower side for third trimester pregnant patient). - keep K> 4, and mag>2 -Requested cardiology input for further recommendations.  Abnormal TSH -TSH was obtained given her recurrent  SVTs, low TSH 0.123, but Free T4  WNL, I have discussed with Mcpherson Mcpherson from [facility], usually TSH was on the lower side during early pregnancy, but this is remains on the lower side given she is in third trimester, but given normal TSH no indication for therapy, this can be followed as an outpatient,  - will obtain thyrotropin receptor antibody.  Obesity Body mass index is 36.84 kg/m. -Will need encouragement about weight loss post-partum   COVID-19 Labs  Recent Labs    05/29/19 1247 05/30/19 0704 05/31/19 0510  DDIMER 1.32*  --   --   FERRITIN 63 63  --   LDH 252*  --   --   CRP 4.5* 4.1* 5.4*    Lab Results  Component Value Date   SARSCOV2NAA POSITIVE (A) 05/17/2019     Code Status : Full  Disposition Plan  :  Status is: Inpatient  Remains inpatient appropriate because:IV treatments appropriate due to intensity of illness or inability to take PO   Dispo: The patient is from: Home              Anticipated d/c is to: Home              Anticipated d/c date is: 2 days              Patient currently is not medically stable to d/c.   DVT Prophylaxis  : Lago lovenox    Thank you for this consult, will keep following patient during hospital stay.   Lab Results  Component Value Date   PLT 194 05/31/2019    Antibiotics  :    Anti-infectives (From admission, onward)   Start     Dose/Rate Route Frequency Ordered Stop   05/30/19 1000  remdesivir 100 mg in sodium chloride 0.9 % 100 mL IVPB  Status:  Discontinued     100 mg 200 mL/hr over 30 Minutes Intravenous Daily 05/29/19 1345 05/29/19 1347  05/30/19 1000  remdesivir 100 mg in sodium chloride 0.9 % 100 mL IVPB     100 mg 200 mL/hr over 30 Minutes Intravenous Daily 05/29/19 1348 06/03/19 0959   05/29/19 1400  remdesivir 200 mg in sodium chloride 0.9% 250 mL IVPB     200 mg 580 mL/hr over 30 Minutes Intravenous Once 05/29/19 1348 05/29/19 1602   05/29/19 1345  remdesivir 200 mg in sodium chloride 0.9%  250 mL IVPB  Status:  Discontinued     200 mg 580 mL/hr over 30 Minutes Intravenous Once 05/29/19 1345 05/29/19 1347        Objective:   Vitals:   05/30/19 2205 05/31/19 0116 05/31/19 0505 05/31/19 0735  BP: 138/80 (!) 133/92 136/79 133/68  Pulse: 100  (!) 109 97  Resp: 20 (!) 21 20   Temp: 98.3 F (36.8 C) 98.3 F (36.8 C) 98.3 F (36.8 C) 98.5 F (36.9 C)  TempSrc: Oral Oral Oral Oral  SpO2: 98% 98% 97% 95%  Weight:      Height:        Wt Readings from Last 3 Encounters:  05/29/19 123.2 kg  05/21/19 (!) 136.5 kg  05/18/19 (!) 136.5 kg     Intake/Output Summary (Last 24 hours) at 05/31/2019 1136 Last data filed at 05/31/2019 1121 Gross per 24 hour  Intake 2651.84 ml  Output 2140 ml  Net 511.84 ml     Physical Exam  Awake Alert, Oriented X 3, No new F.N deficits, Normal affect Symmetrical Chest wall movement, Good air movement bilaterally, CTAB RRR,No Gallops,Rubs or new Murmurs, No Parasternal Heave +ve B.Sounds, Abd Soft,  No Cyanosis, Clubbing or edema, No new Rash or bruise    Patient nurse was present throughout the evaluation   Data Review:    CBC Recent Labs  Lab 05/24/19 1904 05/29/19 1247 05/30/19 0704 05/31/19 0510  WBC 7.7 5.8 6.4 7.3  HGB 13.0 11.6* 11.5* 11.9*  HCT 41.7 35.5* 35.4* 36.9  PLT 202 266 233 194  MCV 90.5 85.5 86.1 85.6  MCH 28.2 28.0 28.0 27.6  MCHC 31.2 32.7 32.5 32.2  RDW 13.6 13.5 13.6 13.6  LYMPHSABS 0.8 0.8 1.0 1.1  MONOABS 0.5 0.3 0.4 0.6  EOSABS 0.0 0.0 0.1 0.1  BASOSABS 0.0 0.0 0.0 0.0    Chemistries  Recent Labs  Lab 05/24/19 1904 05/24/19 1904 05/29/19 1247 05/29/19 1718 05/29/19 2107 05/30/19 0040 05/30/19 0704 05/30/19 1954 05/31/19 0510  NA 137   < > 147*   < > 148* 151* 150* 147* 145  K 3.9   < > 4.1   < > 3.2* 3.6 3.4* 3.4* 3.3*  CL 112*   < > 123*   < > 125* 127* 127* 122* 118*  CO2 8*   < > 11*   < > 13* 16* 14* 15* 15*  GLUCOSE 155*   < > 232*   < > 188* 134* 132* 116* 121*  BUN <5*    < > 12   < > CREATININE 1.00   < > 0.96   < > 0.92 0.97 1.03* 0.87 0.97  CALCIUM 10.0   < > 10.2   < > 10.2 10.1 10.5* 10.2 10.0  MG  --   --  2.0  --   --   --   --  1.7 1.7  AST 26  --  44*  --   --   --  51*  --  48*  ALT 17  --  51*  --   --   --  51*  --  51*  ALKPHOS 110  --  100  --   --   --  99  --  105  BILITOT 0.8  --  1.2  --   --   --  0.9  --  0.9   < > = values in this interval not displayed.   ------------------------------------------------------------------------------------------------------------------ No results for input(s): CHOL, HDL, LDLCALC, TRIG, CHOLHDL, LDLDIRECT in the last 72 hours.  Lab Results  Component Value Date   HGBA1C 7.3 (H) 05/29/2019   ------------------------------------------------------------------------------------------------------------------ Recent Labs    05/30/19 1954  TSH 0.123*   ------------------------------------------------------------------------------------------------------------------ Recent Labs    05/29/19 1247 05/30/19 0704  FERRITIN 63 63    Coagulation profile No results for input(s): INR, PROTIME in the last 168 hours.  Recent Labs    05/29/19 1247  DDIMER 1.32*    Cardiac Enzymes No results for input(s): CKMB, TROPONINI, MYOGLOBIN in the last 168 hours.  Invalid input(s): CK ------------------------------------------------------------------------------------------------------------------    Component Value Date/Time   BNP 26.1 05/29/2019 1247    Inpatient Medications  Scheduled Meds: . albuterol  2 puff Inhalation Q6H  . clotrimazole  1 Applicatorful Vaginal QHS  . docusate sodium  100 mg Oral Daily  . enoxaparin (LOVENOX) injection  60 mg Subcutaneous Q24H  . potassium chloride  40 mEq Oral Q4H  . prenatal multivitamin  1 tablet Oral Q1200  . sodium chloride flush  10-40 mL Intracatheter Q12H  . sodium chloride flush  3 mL Intravenous Q12H   Continuous Infusions: . albumin  human    . dextrose 5 % and 0.45% NaCl Stopped (05/31/19 1032)  . insulin 6.5 Units/hr (05/31/19 0728)  . remdesivir 100 mg in NS 100 mL Stopped (05/30/19 1006)  .  sodium bicarbonate  infusion 1000 mL 125 mL/hr at 05/31/19 0959   PRN Meds:.[DISCONTINUED] acetaminophen **OR** acetaminophen, acetaminophen, bisacodyl, calcium carbonate, dextrose, ondansetron **OR** ondansetron (ZOFRAN) IV, polyethylene glycol, promethazine, sodium chloride flush, zolpidem  Micro Results Recent Results (from the past 240 hour(s))  Urine culture     Status: Abnormal   Collection Time: 05/24/19  9:27 PM   Specimen: Urine, Random  Result Value Ref Range Status   Specimen Description URINE, RANDOM  Final   Special Requests NONE  Final   Culture (A)  Final    30,000 COLONIES/mL LACTOBACILLUS SPECIES Standardized susceptibility testing for this organism is not available. Performed at [facility], [address] [place], [place] [address]    Report Status 05/25/2019 FINAL  Final  OB Urine Culture     Status: Abnormal   Collection Time: 05/29/19 12:08 PM   Specimen: OB Clean Catch; Urine  Result Value Ref Range Status   Specimen Description OB CLEAN CATCH  Final   Special Requests NONE  Final   Culture (A)  Final    <10,000 COLONIES/mL INSIGNIFICANT GROWTH NO GROUP B STREP (S.AGALACTIAE) ISOLATED Performed at [facility], [address] [place], [place] [address]    Report Status 05/30/2019 FINAL  Final    Radiology Reports DG CHEST PORT 1 VIEW  Result Date: 05/29/2019 CLINICAL DATA:  Shortness of breath and cough. COVID-19 positive. EXAM: PORTABLE CHEST 1 VIEW COMPARISON:  May 22, 2019 FINDINGS: Cardiomediastinal silhouette is mildly enlarged. Interval development of patchy bilateral airspace opacities with lower lobe predominance. Osseous structures are without acute abnormality. Soft tissues are grossly normal.  IMPRESSION: 1. Interval development of patchy bilateral airspace opacities  with lower lobe predominance consistent with atypical pneumonia. 2. Enlarged cardiac silhouette. Electronically Signed   By: Ted Mcalpine M.D.   On: 05/29/2019 12:13   DG Chest Port 1 View  Result Date: 05/22/2019 CLINICAL DATA:  Shortness of breath.  COVID-19. EXAM: PORTABLE CHEST 1 VIEW COMPARISON:  08/07/2018 FINDINGS: The heart size and mediastinal contours are within normal limits. Both lungs are clear. The visualized skeletal structures are unremarkable. IMPRESSION: Clear lungs. Electronically Signed   By: Deatra Robinson M.D.   On: 05/22/2019 02:41   [place] MFM FETAL BPP WO NON STRESS  Result Date: 05/30/2019 ----------------------------------------------------------------------  OBSTETRICS REPORT                       (Signed Final 05/30/2019 05:11 pm) ---------------------------------------------------------------------- Patient Info  ID #:       [id number]                          D.O.B.:  09-24-1995 (23 yrs)  Name:       Mcpherson Mcpherson              Visit Date: 05/30/2019 09:39 am ---------------------------------------------------------------------- Performed By  Attending:        Ma Rings MD         Ref. Address:     [facility]                                                             [address]                                                             [place]                                                             [phone]  Performed By:     Tommie Raymond BS,       Secondary Phy.:   [facility]                    [facility]  Referred By:      Mcpherson Camp MD          [facility] ---------------------------------------------------------------------- Orders  #  Description                            Code        Ordered By  1  [place] MFM FETAL BPP WO NON               26834.19    Mcpherson Mcpherson     STRESS ----------------------------------------------------------------------  #  Order #                     Accession #                Episode #  1  [id number]                   [id number]                 [id number] ---------------------------------------------------------------------- Indications  Oligohydraminios, third trimester, unspecified O41.03X0  Gestational diabetes in pregnancy,             O24.415  controlled by oral hypoglycemic drugs, DKA  Obesity complicating pregnancy, third          O99.213  trimester  Unspecified maternal hypertension, third       O16.3  trimester  COVID19 (+)  (4/27)  [redacted] weeks gestation of pregnancy                Z3A.31 ---------------------------------------------------------------------- Fetal Evaluation  Num Of Fetuses:         1  Fetal Heart Rate(bpm):  166  Cardiac Activity:       Observed  Presentation:           Cephalic  Placenta:               Anterior  P. Cord Insertion:      Not well visualized  Amniotic Fluid  AFI FV:      Oligohydramnios  AFI Sum(cm)     %Tile       Largest Pocket(cm)  2.2             < 3         1.1  RUQ(cm)       RLQ(cm)       LUQ(cm)        LLQ(cm)  1.1           0             1.1            0 ---------------------------------------------------------------------- Biophysical Evaluation  Amniotic F.V:   Oligohydramnios            F. Tone:        Observed  F. Movement:    Not Observed               Score:          4/8  F. Breathing:   Observed ---------------------------------------------------------------------- OB History  Gravidity:    1 ---------------------------------------------------------------------- Gestational Age  LMP:           32w 0d        Date:  10/18/18                 EDD:   07/25/19  Best:          Mcpherson Mcpherson 0d     Det. By:  LMP  (10/18/18)          EDD:   07/25/19  ---------------------------------------------------------------------- Anatomy  Diaphragm:             Appears normal         Kidneys:                Appear normal  Stomach:               Appears normal, left   Bladder:                Appears normal                         sided  Other:  Technically difficult due to low amniotic fluid. Technically difficult due          to maternal habitus and fetal position. ---------------------------------------------------------------------- Cervix Uterus Adnexa  Cervix  Not visualized (advanced GA >24wks)  Uterus  No abnormality visualized.  Right Ovary  Within normal limits. No adnexal mass visualized.  Left Ovary  Within normal limits. No adnexal mass visualized.  Cul De Sac  No free fluid seen.  Adnexa  No abnormality visualized. ---------------------------------------------------------------------- Comments  This patient has been hospitalized due to diabetic  ketoacidosis and persistent nausea and vomiting.  She also  tested positive for COVID-19 about 2 weeks ago.  She is  currently being treated with remdesivir for Covid pneumonia.  She has been receiving IV hydration and IV insulin to help  resolve the diabetic ketoacidosis.  This morning, her fetal heart rate tracing has improved.  There are periods of variability and accelerations.  No further  decelerations are noted.  The patient is also more alert and  awake.  Her labs show hypernatremia with a sodium level of 150,  serum creatinine of 1.03, and AST and ALT that are mildly  elevated (51).  Her fingerstick values are now in the 110s to  120s range.  Her blood pressures remain in the 140s over  80s range.  A biophysical profile performed today was 4 out of 8.  The  patient received -2 for absent fetal movements and also -2 for  anhydramnios.  I reviewed the patient's ultrasound images from yesterday.  A  >2 cm pocket of amniotic fluid was noted yesterday.  Due to her symptoms of persistent nausea and vomiting, her   elevated blood pressures, her elevated liver function tests,  and her elevated serum creatinine level, a diagnosis of  preeclampsia has not been ruled out.  A P/C ratio performed this afternoon was 0.86 (elevated).  A  24-hour urine collection is underway to assess the total  protein.  A course of antenatal corticosteroids for fetal lung maturity  has been held due to concerns that it may elevate her blood  glucose levels and further worsen her diabetic ketoacidosis.  The patient should be kept on continuous monitoring  overnight.  I would recommend delivery should the fetal  status be nonreassuring.  Should her diabetic ketoacidosis continue to resolve and her  blood pressures remain elevated therefore increasing the  suspicion for preeclampsia, a course of antenatal  corticosteroids should be administered tomorrow morning.  The patient will continue to receive IV hydration overnight.  We will repeat another ultrasound tomorrow to assess the  amniotic fluid levels, the fetal growth, and umbilical artery  Doppler studies. ----------------------------------------------------------------------                   Mcpherson RingsVictor Fang, MD Electronically Signed Final Report   05/30/2019 05:11 pm ----------------------------------------------------------------------  US MFM FETAL BPP WO NON STRESS  Result Date: 05/29/2019 ----------------------------------------------------------------------  OBSTETRICS REPORT                       (Signed Final 05/29/2019 12:15 pm) ---------------------------------------------------------------------- Patient Info  ID #:       [id number]                          D.O.B.:  [birth year] (23 yrs)  Name:       Mcpherson BrooklynSHAQUASIA N Mcpherson              Visit Date: 05/29/2019 09:58 am ---------------------------------------------------------------------- Performed By  Attending:        Lin Landsmanorenthian Booker      Ref. Address:      [facility]                                                               [facility]                                                              [address]                                                              [address]                                                              [place]                                                              [phone]  Performed By:     Birdena CrandallYasemin Karatas        Location[facility]  Referred By:      Mcpherson Camp MD ---------------------------------------------------------------------- Orders  #  Description                           Code        Ordered By  1  [place] MFM FETAL BPP WO NON               16109.60    Mcpherson Mcpherson     STRESS ----------------------------------------------------------------------  #  Order #                     Accession #                Episode #  1  [id number]                   [id number]                 [id number] ---------------------------------------------------------------------- Indications  Gestational diabetes in pregnancy,              O24.415  controlled by oral hypoglycemic drugs, DKA  Obesity complicating pregnancy, third           O99.213  trimester  Unspecified maternal hypertension, third        O16.3  trimester  COVID19 (+)  (4/27)  Oligohydraminios, third trimester, unspecified  O41.03X0  [redacted] weeks gestation of pregnancy                 Z3A.31 ---------------------------------------------------------------------- Fetal Evaluation  Num Of Fetuses:          1  Fetal Heart Rate(bpm):   150  Cardiac Activity:        Observed  Presentation:            Cephalic  Placenta:                Anterior  Amniotic Fluid  AFI FV:      Within normal limits  AFI Sum(cm)     %Tile       Largest Pocket(cm)  9.5             12          2.8  RUQ(cm)       RLQ(cm)       LUQ(cm)        LLQ(cm)  2.8           2.5           2.8            1.4 ---------------------------------------------------------------------- Biophysical Evaluation   Amniotic F.V:   Within normal limits       F. Tone:         Observed  F. Movement:    Not Observed               Score:           4/8  F. Breathing:   Not Observed ---------------------------------------------------------------------- OB History  Gravidity:    1 ---------------------------------------------------------------------- Gestational Age  LMP:  31w 6d        Date:  10/18/18                 EDD:   07/25/19  Best:          Mcpherson Mcpherson 6d     Det. By:  LMP  (10/18/18)          EDD:   07/25/19 ---------------------------------------------------------------------- Anatomy  Stomach:               Appears normal, left   Kidneys:                Appear normal                         sided  Abdominal Wall:        Appears nml (cord      Bladder:                Appears normal                         insert, abd wall) ---------------------------------------------------------------------- Impression  Limited exam due to presumed maternal DKA  Biophysical profile 4 out of 8 there is some movement but not  enough to pass the BPP.  Labs are pending with continued fetal monitoring.  I discussed with Dr. Rana Snare the current plan and agree. ---------------------------------------------------------------------- Recommendations  Continueed Fetal monitoring  Hold Betamethsone  DKA protocol including insulin drip and fluid hydration with  the goal of blood sugars 100-140's.  Will await labs to perform consultation and diagnosis of DKA.  Continue COVID protocol. ----------------------------------------------------------------------               Mcpherson Landsman, MD Electronically Signed Final Report   05/29/2019 12:15 pm ----------------------------------------------------------------------  ECHOCARDIOGRAM COMPLETE  Result Date: 05/31/2019    ECHOCARDIOGRAM REPORT   Patient Name:   TANJA GIFT Date of Exam: 05/31/2019 Medical Rec #:  [id number]          Height:       72.0 in Accession #:    [id number]         Weight:        271.6 lb Date of Birth:  November 04, 1995          BSA:          2.427 m Patient Age:    23 years           BP:           133/68 mmHg Patient Gender: F                  HR:           109 bpm. Exam Location:  Inpatient Procedure: 2D Echo, Color Doppler and Cardiac Doppler Indications:    I47.1 SVT  History:        Patient has no prior history of Echocardiogram examinations.                 COVID+ 05/17/19                 [redacted] Weeks Pregnant at time of study.  Sonographer:    Irving Burton Senior RDCS Referring Phys: [id number] Savan Ruta S Randol Kern  Sonographer Comments: Technically difficult due to patient body habitus. IMPRESSIONS  1. Left ventricular ejection fraction, by estimation, is 50 to 55%. The left ventricle has low normal function. The left ventricle demonstrates global  hypokinesis. Indeterminate diastolic filling due to E-A fusion. There is abnormal (paradoxical) septal  motion, consistent with right ventricular volume overload.  2. Right ventricular systolic function is normal. The right ventricular size is normal. Tricuspid regurgitation signal is inadequate for assessing PA pressure.  3. 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 The inferior vena cava is also dilated, but the right heart chambers are not enlarged.  4. The mitral valve is normal in structure. No evidence of mitral valve regurgitation. No evidence of mitral stenosis.  5. The aortic valve is normal in structure. Aortic valve regurgitation is not visualized. No aortic stenosis is present.  6. The inferior vena cava is dilated in size with <50% respiratory variability, suggesting right atrial pressure of 15 mmHg. Comparison(s): No prior Echocardiogram. Conclusion(s)/Recommendation(s): Recommend a saline contrast injection from a vein in the left upper extremity, with imaging of the coronary sinus. FINDINGS  Left Ventricle: Left ventricular ejection fraction, by estimation, is 50 to 55%. The left ventricle has low normal  function. The left ventricle demonstrates global hypokinesis. The left ventricular internal cavity size was normal in size. There is no left ventricular hypertrophy. Abnormal (paradoxical) septal motion, consistent with right ventricular volume overload. Indeterminate diastolic filling due to E-A fusion. Right Ventricle: The right ventricular size is normal. No increase in right ventricular wall thickness. Right ventricular systolic function is normal. Tricuspid regurgitation signal is inadequate for assessing PA pressure. Left Atrium: Left atrial size was normal in size. 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 The inferior vena cava is also dilated, but the right heart chambers are not enlarged. Right atrial size was normal in size. Pericardium: There is no evidence of pericardial effusion. Mitral Valve: The mitral valve is normal in structure. Normal mobility of the mitral valve leaflets. No evidence of mitral valve regurgitation. No evidence of mitral valve stenosis. Tricuspid Valve: The tricuspid valve is normal in structure. Tricuspid valve regurgitation is not demonstrated. No evidence of tricuspid stenosis. Aortic Valve: The aortic valve is normal in structure. Aortic valve regurgitation is not visualized. No aortic stenosis is present. Pulmonic Valve: The pulmonic valve was normal in structure. Pulmonic valve regurgitation is not visualized. No evidence of pulmonic stenosis. Aorta: The aortic root is normal in size and structure. Venous: The inferior vena cava is dilated in size with less than 50% respiratory variability, suggesting right atrial pressure of 15 mmHg. IAS/Shunts: No atrial level shunt detected by color flow Doppler.  LEFT VENTRICLE PLAX 2D LVIDd:         5.00 cm LVIDs:         3.80 cm LV PW:         1.10 cm LV IVS:        1.40 cm LVOT diam:     2.00 cm LV SV:         56 LV SV Index:   23 LVOT Area:     3.14 cm  RIGHT VENTRICLE RV S  prime:     12.20 cm/s TAPSE (M-mode): 1.8 cm LEFT ATRIUM           Index       RIGHT ATRIUM           Index LA diam:      3.20 cm 1.32 cm/m  RA Area:     17.60 cm LA Vol (A2C): 45.3 ml 18.67 ml/m RA Volume:   51.10 ml  21.06 ml/m  LA Vol (A4C): 47.7 ml 19.66 ml/m  AORTIC VALVE LVOT Vmax:   104.00 cm/s LVOT Vmean:  79.700 cm/s LVOT VTI:    0.179 m  AORTA Ao Root diam: 3.30 cm Ao Asc diam:  2.80 cm  SHUNTS Systemic VTI:  0.18 m Systemic Diam: 2.00 cm Sanda Klein MD Electronically signed by Sanda Klein MD Signature Date/Time: 05/31/2019/9:09:24 AM    Final      Phillips Climes M.D on 05/31/2019 at 11:36 AM  Between 7am to 7pm - Pager - [phone]  After 7pm go to [email or web address] - password [facility]  [facility] -  Office  [phone]

## 2019-05-31 NOTE — Progress Notes (Addendum)
RN contacted Marikay Alar, FNP concerning inability to draw maternal labs and status of echo which was ordered 05/30/19 at 1800.   Katherina Right advised that the echo could wait until the morning. She also recommended albumin IV to expand veins for access if we can not gain access through midline. She recommended I review this with OB provider.   Midline access attempted at 0300 with no success. Mitchel Honour, DO, OB provider notified. Dr. Langston Masker requested we hold on albumin administration. Albumin not given. RN will discuss options for venous access with patient.   Quincy Simmonds, RN

## 2019-05-31 NOTE — Progress Notes (Signed)
Echocardiogram 2D Echocardiogram has been performed.  Kelli Mcpherson 05/31/2019, 8:38 AM

## 2019-05-31 NOTE — Consult Note (Signed)
MFM Note  This patient has been hospitalized due to diabetic ketoacidosis and possible preeclampsia.  Her blood pressures remain in the 120s to 150s over 80s to 90s range.  Her blood glucose levels are now in the normal range and her diabetic ketoacidosis is resolving.  On today's exam, the overall fetal growth was appropriate for her gestational age.  Oligohydramnios continues to be noted today.  Doppler studies of the umbilical arteries performed today shows a normal S/D ratio.  There were no signs of absent or reversed end-diastolic flow.  A biophysical profile performed today was 6 out of 8.  She received a -2 for absent fetal tone.  Her 24-hour urine results are currently pending.  Based on her elevated blood pressures and her elevated P/C ratio, she probably is developing preeclampsia.  Due to probable preeclampsia at her current gestational age, now that her diabetic ketoacidosis is resolving, I would recommend that she receive a complete course of antenatal corticosteroids.  She should be kept on IV insulin while she is receiving her steroid course.  She should have another ultrasound later this week to reassess the amniotic fluid levels.

## 2019-05-31 NOTE — Progress Notes (Signed)
Peripherally Inserted Central Catheter Placement  The IV Nurse has discussed with the patient and/or persons authorized to consent for the patient, the purpose of this procedure and the potential benefits and risks involved with this procedure.  The benefits include less needle sticks, lab draws from the catheter, and the patient may be discharged home with the catheter. Risks include, but not limited to, infection, bleeding, blood clot (thrombus formation), and puncture of an artery; nerve damage and irregular heartbeat and possibility to perform a PICC exchange if needed/ordered by physician.  Alternatives to this procedure were also discussed.  Bard Power PICC patient education guide, fact sheet on infection prevention and patient information card has been provided to patient /or left at bedside.    PICC Placement Documentation  PICC Double Lumen 05/31/19 PICC Left Brachial 47 cm 1 cm (Active)  Indication for Insertion or Continuance of Line Vasoactive infusions 05/31/19 1535  Exposed Catheter (cm) 1 cm 05/31/19 1535  Site Assessment Clean;Dry;Intact 05/31/19 1535  Lumen #1 Status Flushed;Saline locked;Blood return noted 05/31/19 1535  Lumen #2 Status Flushed;Saline locked;Blood return noted 05/31/19 1535  Dressing Type Transparent 05/31/19 1535  Dressing Status Clean;Dry;Intact;Antimicrobial disc in place 05/31/19 1535  Dressing Intervention New dressing 05/31/19 1535  Dressing Change Due 06/07/19 05/31/19 1535       Ethelda Chick 05/31/2019, 3:36 PM

## 2019-05-31 NOTE — Progress Notes (Addendum)
Inpatient Diabetes Program Recommendations  Diabetes Treatment Program Recommendations  ADA Standards of Care 2018 Diabetes in Pregnancy Target Glucose Ranges:  Fasting: 60 - 90 mg/dL Preprandial: 60 - 092 mg/dL 1 hr postprandial: Less than 140mg /dL (from first bite of meal) 2 hr postprandial: Less than 120 mg/dL (from first bite of meal)    Lab Results  Component Value Date   GLUCAP 110 (H) 05/31/2019   HGBA1C 7.3 (H) 05/29/2019    Review of Glycemic Control Results for MILLIE, FORDE" (MRN [place] Reek) as of 05/31/2019 08:54  Ref. Range 05/31/2019 01:11 05/31/2019 03:11 05/31/2019 05:14 05/31/2019 07:26  Glucose-Capillary Latest Ref Range: 70 - 99 mg/dL 07/31/2019 (H) 90 333 (H) 545 (H)   Diabetes history: GDM Outpatient Diabetes medications: Metformin Current orders for Inpatient glycemic control: Endotool/DKA in preg  COVID + and DKA (31 weeks) Sodium bicarb 100 mEq in D5%, potassium chloride 10 mEq x 5  Inpatient Diabetes Program Recommendations:    Continue with IV insulin.   Discussed plan of care with Dr 625 to include recommendations for continuing IV insulin. Also, reviewed Dr Vincente Poli note from yesterday with concerns of preeclampsia. If patient requires BMZ, would not have concern with administration as long as patient is on IV insulin through delivery.  Will develop plan for insulin needs and transition as patient progresses.  Following.  @1140 : Discussed plan with Dr Loretta Plume. MD wants to transition patient. Reviewed possible plan for BMZ per MFM and OB. If plan for BMZ and/or delivery would recommend continuing IV insulin.  Current drip rate on average is 7.5 units/hr and would increase with steroids.    Thanks, , MSN, CDE, RNC-OB Diabetes Coordinator [phone] (8a-5p)

## 2019-05-31 NOTE — Progress Notes (Addendum)
Prior nurse notified RN of patient refusing BMZ. This RN re-educated patient on BMZ dose. Patient refuses BMZ at this time.

## 2019-05-31 NOTE — Progress Notes (Signed)
IVPB Magnesium started through midline IV. Pt complaining of burning and states that she cannot tolerate the medication through her midline. Magnesium IVPB stopped per pt request. Midline flushed with normal saline, pt states normal saline burns.   Dr Randol Kern notified of pt status. MD gave RN a verbal order for pt to get a PICC line. RN will place order for PICC line access.

## 2019-05-31 NOTE — Progress Notes (Signed)
Asked by nursing staff to assess patient for 2nd opinion due to changes in patient's exam. Upon arrival, Kelli Mcpherson was awake, sitting in a semi-fowlers position in bed. She answered questions with slight delay and soft voice. She is oriented to self, place, situation. She thought it was April instead of May. Pt does have an intermittent tremor in her bilateral upper extremities that terminates when she is participating in voluntary movements. She is generally weak however is free of any focal deficit. She does have poor effort and required multiple requests to perform tasks. CBG 120. Her only complaint was the room was too hot. Please feel free to contact RRRN for any further assistance.

## 2019-05-31 NOTE — Progress Notes (Signed)
This RN attempted to draw patient labs from PICC line. PICC line with difficulty flushing and drawing back. IV team consulted to assess. [facility] notified for peripheral lab draw.

## 2019-05-31 NOTE — Progress Notes (Signed)
I have reviewed her chart, reviewed the rhythm strips on the telemetry, patient is having paroxysmal episodes of SVT with a heart rate of 150 to 180 bpm.  No atrial fibrillation.  Rare PVCs and 2-3 beat wide-complex rhythm suggestive of aberrancy than NSVT. Done at the request of Dr. Huey Bienenstock.   Cardiac serum troponins are minimally elevated, do not suspect Covid cardiomyopathy.  Echocardiogram independently reviewed, LVEF appears to be normal.  There is no right ventricular strain.  Large incidental coronary sinus noted, sometimes can be seen in presence of anomalous azygos vein or persistent left superior vena cava draining directly into the coronary sinus.  In view of her pregnancy, unless SVT is persistent and symptomatic, would not recommend evaluation, certainly can be treated with ablation if necessary or use of beta-blockers or calcium channel blockers as deemed appropriate by OB/GYN.  If no contraindication, would recommend diltiazem CD 120 - 180 mg daily to start with, in view of hypertension, possible development of preeclampsia, she may need higher doses if there is no contraindication.  Please do not hesitate to call me if you need my assistance in managing the patient.  Yates Decamp, MD, [facility] 05/31/2019, 8:16 PM [facility] Office: [phone]  M: [phone]

## 2019-06-01 ENCOUNTER — Inpatient Hospital Stay (HOSPITAL_COMMUNITY): Payer: Managed Care, Other (non HMO)

## 2019-06-01 DIAGNOSIS — E0810 Diabetes mellitus due to underlying condition with ketoacidosis without coma: Secondary | ICD-10-CM

## 2019-06-01 LAB — GLUCOSE, CAPILLARY
Glucose-Capillary: 101 mg/dL — ABNORMAL HIGH (ref 70–99)
Glucose-Capillary: 105 mg/dL — ABNORMAL HIGH (ref 70–99)
Glucose-Capillary: 109 mg/dL — ABNORMAL HIGH (ref 70–99)
Glucose-Capillary: 110 mg/dL — ABNORMAL HIGH (ref 70–99)
Glucose-Capillary: 113 mg/dL — ABNORMAL HIGH (ref 70–99)
Glucose-Capillary: 115 mg/dL — ABNORMAL HIGH (ref 70–99)
Glucose-Capillary: 121 mg/dL — ABNORMAL HIGH (ref 70–99)
Glucose-Capillary: 128 mg/dL — ABNORMAL HIGH (ref 70–99)
Glucose-Capillary: 130 mg/dL — ABNORMAL HIGH (ref 70–99)
Glucose-Capillary: 136 mg/dL — ABNORMAL HIGH (ref 70–99)
Glucose-Capillary: 138 mg/dL — ABNORMAL HIGH (ref 70–99)
Glucose-Capillary: 145 mg/dL — ABNORMAL HIGH (ref 70–99)
Glucose-Capillary: 147 mg/dL — ABNORMAL HIGH (ref 70–99)
Glucose-Capillary: 159 mg/dL — ABNORMAL HIGH (ref 70–99)
Glucose-Capillary: 163 mg/dL — ABNORMAL HIGH (ref 70–99)
Glucose-Capillary: 168 mg/dL — ABNORMAL HIGH (ref 70–99)
Glucose-Capillary: 170 mg/dL — ABNORMAL HIGH (ref 70–99)
Glucose-Capillary: 189 mg/dL — ABNORMAL HIGH (ref 70–99)
Glucose-Capillary: 90 mg/dL (ref 70–99)
Glucose-Capillary: 98 mg/dL (ref 70–99)

## 2019-06-01 LAB — BASIC METABOLIC PANEL
Anion gap: 10 (ref 5–15)
Anion gap: 14 (ref 5–15)
Anion gap: 8 (ref 5–15)
Anion gap: 9 (ref 5–15)
BUN: 10 mg/dL (ref 6–20)
BUN: 8 mg/dL (ref 6–20)
BUN: 8 mg/dL (ref 6–20)
BUN: 9 mg/dL (ref 6–20)
CO2: 19 mmol/L — ABNORMAL LOW (ref 22–32)
CO2: 19 mmol/L — ABNORMAL LOW (ref 22–32)
CO2: 19 mmol/L — ABNORMAL LOW (ref 22–32)
CO2: 22 mmol/L (ref 22–32)
Calcium: 9 mg/dL (ref 8.9–10.3)
Calcium: 9 mg/dL (ref 8.9–10.3)
Calcium: 9.3 mg/dL (ref 8.9–10.3)
Calcium: 9.3 mg/dL (ref 8.9–10.3)
Chloride: 108 mmol/L (ref 98–111)
Chloride: 111 mmol/L (ref 98–111)
Chloride: 114 mmol/L — ABNORMAL HIGH (ref 98–111)
Chloride: 116 mmol/L — ABNORMAL HIGH (ref 98–111)
Creatinine, Ser: 0.8 mg/dL (ref 0.44–1.00)
Creatinine, Ser: 0.81 mg/dL (ref 0.44–1.00)
Creatinine, Ser: 0.93 mg/dL (ref 0.44–1.00)
Creatinine, Ser: 1 mg/dL (ref 0.44–1.00)
GFR calc Af Amer: >60 mL/min (ref >60)
GFR calc Af Amer: >60 mL/min (ref >60)
GFR calc Af Amer: >60 mL/min (ref >60)
GFR calc Af Amer: >60 mL/min (ref >60)
GFR calc non Af Amer: >60 mL/min (ref >60)
GFR calc non Af Amer: >60 mL/min (ref >60)
GFR calc non Af Amer: >60 mL/min (ref >60)
GFR calc non Af Amer: >60 mL/min (ref >60)
Glucose, Bld: 132 mg/dL — ABNORMAL HIGH (ref 70–99)
Glucose, Bld: 141 mg/dL — ABNORMAL HIGH (ref 70–99)
Glucose, Bld: 154 mg/dL — ABNORMAL HIGH (ref 70–99)
Glucose, Bld: 195 mg/dL — ABNORMAL HIGH (ref 70–99)
Potassium: 3.1 mmol/L — ABNORMAL LOW (ref 3.5–5.1)
Potassium: 3.3 mmol/L — ABNORMAL LOW (ref 3.5–5.1)
Potassium: 3.3 mmol/L — ABNORMAL LOW (ref 3.5–5.1)
Potassium: 3.4 mmol/L — ABNORMAL LOW (ref 3.5–5.1)
Sodium: 140 mmol/L (ref 135–145)
Sodium: 141 mmol/L (ref 135–145)
Sodium: 144 mmol/L (ref 135–145)
Sodium: 144 mmol/L (ref 135–145)

## 2019-06-01 LAB — CBC WITH DIFFERENTIAL/PLATELET
Abs Immature Granulocytes: 0.16 10*3/uL — ABNORMAL HIGH (ref 0.00–0.07)
Basophils Absolute: 0 10*3/uL (ref 0.0–0.1)
Basophils Relative: 1 %
Eosinophils Absolute: 0.1 10*3/uL (ref 0.0–0.5)
Eosinophils Relative: 2 %
HCT: 35.5 % — ABNORMAL LOW (ref 36.0–46.0)
Hemoglobin: 11.7 g/dL — ABNORMAL LOW (ref 12.0–15.0)
Immature Granulocytes: 3 %
Lymphocytes Relative: 25 %
Lymphs Abs: 1.4 10*3/uL (ref 0.7–4.0)
MCH: 27.9 pg (ref 26.0–34.0)
MCHC: 33 g/dL (ref 30.0–36.0)
MCV: 84.5 fL (ref 80.0–100.0)
Monocytes Absolute: 0.4 10*3/uL (ref 0.1–1.0)
Monocytes Relative: 7 %
Neutro Abs: 3.5 10*3/uL (ref 1.7–7.7)
Neutrophils Relative %: 62 %
Platelets: 187 10*3/uL (ref 150–400)
RBC: 4.2 MIL/uL (ref 3.87–5.11)
RDW: 13.4 % (ref 11.5–15.5)
WBC: 5.6 10*3/uL (ref 4.0–10.5)
nRBC: 5 % — ABNORMAL HIGH (ref 0.0–0.2)

## 2019-06-01 LAB — COMPREHENSIVE METABOLIC PANEL
ALT: 50 U/L — ABNORMAL HIGH (ref 0–44)
AST: 48 U/L — ABNORMAL HIGH (ref 15–41)
Albumin: 1.9 g/dL — ABNORMAL LOW (ref 3.5–5.0)
Alkaline Phosphatase: 101 U/L (ref 38–126)
Anion gap: 8 (ref 5–15)
BUN: 8 mg/dL (ref 6–20)
CO2: 23 mmol/L (ref 22–32)
Calcium: 9.2 mg/dL (ref 8.9–10.3)
Chloride: 115 mmol/L — ABNORMAL HIGH (ref 98–111)
Creatinine, Ser: 0.93 mg/dL (ref 0.44–1.00)
GFR calc Af Amer: >60 mL/min (ref >60)
GFR calc non Af Amer: >60 mL/min (ref >60)
Glucose, Bld: 106 mg/dL — ABNORMAL HIGH (ref 70–99)
Potassium: 3 mmol/L — ABNORMAL LOW (ref 3.5–5.1)
Sodium: 146 mmol/L — ABNORMAL HIGH (ref 135–145)
Total Bilirubin: 0.6 mg/dL (ref 0.3–1.2)
Total Protein: 5.6 g/dL — ABNORMAL LOW (ref 6.5–8.1)

## 2019-06-01 LAB — BETA-HYDROXYBUTYRIC ACID
Beta-Hydroxybutyric Acid: 0.06 mmol/L (ref 0.05–0.27)
Beta-Hydroxybutyric Acid: 0.07 mmol/L (ref 0.05–0.27)
Beta-Hydroxybutyric Acid: 0.12 mmol/L (ref 0.05–0.27)
Beta-Hydroxybutyric Acid: 0.4 mmol/L — ABNORMAL HIGH (ref 0.05–0.27)

## 2019-06-01 LAB — THYROTROPIN RECEPTOR AUTOABS: Thyrotropin Receptor Ab: <1.1 IU/L (ref 0.00–1.75)

## 2019-06-01 LAB — C-REACTIVE PROTEIN: CRP: 6.3 mg/dL — ABNORMAL HIGH (ref <1.0)

## 2019-06-01 MED ORDER — POTASSIUM CHLORIDE 10 MEQ/100ML IV SOLN
10.0000 meq | INTRAVENOUS | Status: AC
  Administered 2019-06-01 (×3): 10 meq via INTRAVENOUS
  Filled 2019-06-01 (×3): qty 100

## 2019-06-01 MED ORDER — TRIAMCINOLONE ACETONIDE 0.1 % EX CREA
TOPICAL_CREAM | Freq: Two times a day (BID) | CUTANEOUS | Status: DC | PRN
  Filled 2019-06-01 (×2): qty 15

## 2019-06-01 MED ORDER — GENERIC EXTERNAL MEDICATION
Status: DC
Start: ? — End: 2019-06-01

## 2019-06-01 MED ORDER — MENTHOL 3 MG MT LOZG
1.0000 | LOZENGE | OROMUCOSAL | Status: DC | PRN
  Administered 2019-06-01 – 2019-06-05 (×2): 3 mg via ORAL
  Filled 2019-06-01 (×5): qty 9

## 2019-06-01 NOTE — Progress Notes (Signed)
Xray resulted and stated that PICC is in stable position. PICC Nurse to follow up pt. In a.m.

## 2019-06-01 NOTE — Progress Notes (Signed)
Kelli Mcpherson is a 24 y.o. female patient. Today she reports feeling much better. Finally has an appetite and denies emesis. Has tolerated a normal breakfast today. For the first time feels well enough to ambulate confidently. No SOB, CP, change in vision or HA. PICC line placed yesterday to help with lab draws.   1. COVID-19   2. DKA (diabetic ketoacidoses) ([place])   3. Diabetic keto-acidosis ([place])   4. Diabetic ketoacidosis without coma associated with diabetes mellitus due to underlying condition ([place])   5. COVID-19 affecting pregnancy in third trimester   6. PICC (peripherally inserted central catheter) in place   7. Probable Preeclampsia   Past Medical History:  Diagnosis Date  . Chlamydia   . COVID-19   . Eczema   . Gonorrhea     Current Facility-Administered Medications  Medication Dose Route Frequency Provider Last Rate Last Admin  . acetaminophen (TYLENOL) suppository 650 mg  650 mg Rectal Q6H PRN Karmen Bongo, MD      . acetaminophen (TYLENOL) tablet 650 mg  650 mg Oral Q4H PRN Louretta Shorten, MD      . albumin human 5 % solution 12.5 g  12.5 g Intravenous Once Lang Snow, FNP      . albuterol (VENTOLIN HFA) 108 (90 Base) MCG/ACT inhaler 2 puff  2 puff Inhalation Q6H Karmen Bongo, MD   2 puff at 06/01/19 1400  . betamethasone acetate-betamethasone sodium phosphate (CELESTONE) injection 12 mg  12 mg Intramuscular Q24 Hr x 2 Dian Queen, MD   12 mg at 06/01/19 0338  . bisacodyl (DULCOLAX) EC tablet 5 mg  5 mg Oral Daily PRN Karmen Bongo, MD      . calcium carbonate (TUMS - dosed in mg elemental calcium) chewable tablet 400 mg of elemental calcium  2 tablet Oral Q4H PRN Louretta Shorten, MD      . Chlorhexidine Gluconate Cloth 2 % PADS 6 each  6 each Topical Daily Elgergawy, Silver Huguenin, MD   6 each at 06/01/19 [phone]  . clotrimazole (GYNE-LOTRIMIN) vaginal cream 1 Applicatorful  1 Applicatorful Vaginal QHS Morris, [place], DO   1 Applicatorful at 44/01/02 2047  . dextrose  5 %-0.45 % sodium chloride infusion   Intravenous Continuous Elgergawy, Silver Huguenin, MD 125 mL/hr at 06/01/19 1300 New Bag at 06/01/19 1300  . dextrose 50 % solution 0-50 mL  0-50 mL Intravenous PRN Karmen Bongo, MD      . docusate sodium (COLACE) capsule 100 mg  100 mg Oral Daily Louretta Shorten, MD   100 mg at 06/01/19 0929  . enoxaparin (LOVENOX) injection 60 mg  60 mg Subcutaneous Q24H Louretta Shorten, MD   60 mg at 05/30/19 1746  . insulin regular, human (MYXREDLIN) 100 units/ 100 mL infusion   Intravenous Continuous Karmen Bongo, MD 19 mL/hr at 06/01/19 1405 19 Units/hr at 06/01/19 1405  . ondansetron (ZOFRAN) tablet 4 mg  4 mg Oral Q6H PRN Karmen Bongo, MD       Or  . ondansetron [facility]) injection 4 mg  4 mg Intravenous Q6H PRN Karmen Bongo, MD   4 mg at 05/31/19 2153  . polyethylene glycol (MIRALAX / GLYCOLAX) packet 17 g  17 g Oral Daily PRN Karmen Bongo, MD      . prenatal multivitamin tablet 1 tablet  1 tablet Oral Q1200 Louretta Shorten, MD   1 tablet at 06/01/19 1002  . promethazine (PHENERGAN) suppository 12.5-25 mg  12.5-25 mg Rectal Q6H PRN Karmen Bongo, MD      .  remdesivir 100 mg in sodium chloride 0.9 % 100 mL IVPB  100 mg Intravenous Daily Robinette Haines, RPH 200 mL/hr at 06/01/19 0928 100 mg at 06/01/19 0928  . sodium chloride flush (NS) 0.9 % injection 10-40 mL  10-40 mL Intracatheter Q12H Elgergawy, Leana Roe, MD   10 mL at 06/01/19 0929  . sodium chloride flush (NS) 0.9 % injection 10-40 mL  10-40 mL Intracatheter PRN Elgergawy, Leana Roe, MD      . sodium chloride flush (NS) 0.9 % injection 3 mL  3 mL Intravenous Q12H Jonah Blue, MD   3 mL at 05/30/19 1030  . zolpidem (AMBIEN) tablet 5 mg  5 mg Oral QHS PRN Candice Camp, MD       No Known Allergies Principal Problem:   COVID-19 affecting pregnancy in third trimester Active Problems:   Obesity   DKA (diabetic ketoacidosis) (HCC)  Blood pressure 121/73, pulse (!) 113, temperature 98.2 F (36.8 C), temperature source  Oral, resp. rate 20, height 6' (1.829 m), weight 123.2 kg, last menstrual period 07/31/2018, SpO2 98 %.  Review of Systems  Physical Exam  A&O, up walking around, smiling, conversive NWOB Reg rate on exam earlier Abd gravid nontender No blood on mesh underwear  A/P: 24 yo G1P0 @ 32.2wga presenting w/DKA in the setting of COVID + and poor diabetic control. She is more stable today and seems to be progressing. However, some concern for developing PIH given elevated Bps.   Probable PIH:  - Bps mild to moderate range - PIH labs w/stably elevated LFTs possibly s/s dehydration but otherwise no severe features - UPC elevated to 0.82 - 24 urine in process - ctm closely - Magnesium IV prn development of severe features  FWB: upon admission FHT w/repetitive lates. - FHT has been improving since DKA has been resolving BPP 6/8 5/11 with normal growth and oligohydramnios.  MFM following closely and recommend repeat BPP later this week (plan Thurs or Fri). UAD normal yesterday. They  recommended continuous monitoring. - DELIVERY is recommended should the fetal status be consistently nonreassuring per MFM. - BMZ #1 given, will give BMZ#1 tomorrow  DKA : resolving - Management per IM and DM care - Labs improving  - Continue albumin and insulin until BMZ complete - Encourage patient to eat and drink  COVID +(April 27) - Management per IM - Remdesivir last dose due tomorrow  Ranae Pila 06/01/2019

## 2019-06-01 NOTE — Progress Notes (Signed)
Patient's LUA PICC assessed and pulled back 1 cm to get a blood return. Dressing and cap changed, dry and intact. RN aware.

## 2019-06-01 NOTE — Progress Notes (Addendum)
Dr. Vincente Poli notified of PICC line not drawing back. -Portable Chest X-Ray ordered to confirm position of tip of catheter. IV Team nurse not aware if patient would be able to get tPA d/t patient being pregnant. Dr. Antionette Char notified of no blood return from PICC and notified of potassium of 3.1. No new orders received.

## 2019-06-01 NOTE — Progress Notes (Signed)
Inpatient Diabetes Program Recommendations  Diabetes Treatment Program Recommendations  ADA Standards of Care 2018 Diabetes in Pregnancy Target Glucose Ranges:  Fasting: 60 - 90 mg/dL Preprandial: 60 - 882 mg/dL 1 hr postprandial: Less than 140mg /dL (from first bite of meal) 2 hr postprandial: Less than 120 mg/dL (from first bite of meal)    Lab Results  Component Value Date   GLUCAP 128 (H) 06/01/2019   HGBA1C 7.3 (H) 05/29/2019    Review of Glycemic Control Results for Kelli Mcpherson, Kelli Mcpherson" (MRN [facility] Reek) as of 06/01/2019 09:00  Ref. Range 06/01/2019 03:14 06/01/2019 05:15 06/01/2019 07:20 06/01/2019 08:28  Glucose-Capillary Latest Ref Range: 70 - 99 mg/dL 90 08/01/2019 (H) 150 (H) 569 (H)   Diabetes history:GDM Outpatient Diabetes medications:Metformin Current orders for Inpatient glycemic control:Endotool/DKA in preg  COVID + and DKA (31 weeks) BMZ x1 (of 2)  Inpatient Diabetes Program Recommendations:  Noted administration of BMZ, thus further increasing insulin needs. Patient has received > 124 units in a 24 hour period.   If plan is for patient to received 2nd dose of BMZ on 5/13, then proceed towards delivery would continue with IV insulin.   Per Dr 6/13 MFM on 5/11, "Due to probable preeclampsia at her current gestational age, now that her diabetic ketoacidosis is resolving, I would recommend that she receive a complete course of antenatal corticosteroids.  She should be kept on IV insulin while she is receiving her steroid course."   Will continue to follow.   Thanks, 7/11, MSN, RNC-OB Diabetes Coordinator [phone] (8a-5p)

## 2019-06-01 NOTE — Progress Notes (Signed)
PICC nurse not in house tonight. IV team PICC nurse will consult patient in the AM.

## 2019-06-01 NOTE — Progress Notes (Signed)
PROGRESS NOTE                                                                                                                                                                                                             Patient Demographics:    Kelli Mcpherson, is a 24 y.o. female, DOB - Apr 07, 1995, ZOX:[id number]  Outpatient Primary MD for the patient is Ranae Pila, MD   Admit date - 05/29/2019   LOS - 3  No chief complaint on file.      Brief Narrative: Patient is a 24 y.o. female with PMHx of gestational diabetes-on Metformin-who presented with DKA and COVID-19 pneumonia.  Hospitalist service consulted for management of these conditions.  Significant Events: 4/27>> Covid positive 5/2>> ED visit for S OB/nausea/vomiting and lower abdominal pain-given IV fluids and discharged home. 5/4>> ED visit with nausea/vomiting-discharged home 5/9>> admit for COVID-19 pneumonia, DKA, hospitalist service consulted.  COVID-19 medications: Steroids: 5/11>> Remdesivir: 5/9>>  Antibiotics: None  Microbiology data: 5/9: Urine culture: <10,000 colonies/mL insignificant growth, no group B strep isolated  DVT prophylaxis: SQ Lovenox  Procedures: None  Consults: None    Subjective:    Kelli Mcpherson today complains of some cough-but does not complain of shortness of breath.  On 2 L of oxygen this morning.   Assessment  & Plan :   Acute Hypoxic Resp Failure due to Covid 19 Viral pneumonia: Mildly hypoxic this morning on 2 L but O2 saturations otherwise stable.  CRP still elevated.  Steroids started yesterday by the OB service-if hypoxia continues-may need to extend dosage beyond what is required for OB indication.  Remains on remdesivir.  Continue supportive care and will follow closely.  Fever: afebrile O2 requirements:  SpO2: 95 % O2 Flow Rate (L/min): 2 L/min  Prone/Incentive Spirometry: encouraged  incentive spirometry use 3-4/hour.  COVID-19 Labs: Recent Labs    05/29/19 1247 05/29/19 1247 05/30/19 0704 05/31/19 0510 06/01/19 0314  DDIMER 1.32*  --   --   --   --   FERRITIN 63  --  63  --   --   LDH 252*  --   --   --   --   CRP 4.5*   < > 4.1* 5.4* 6.3*   < > = values in this interval not displayed.  Component Value Date/Time   BNP 26.1 05/29/2019 1247    Recent Labs  Lab 05/29/19 1247  PROCALCITON 0.23    Lab Results  Component Value Date   SARSCOV2NAA POSITIVE (A) 05/17/2019    DKA/starvation ketoacidosis (A1c 7.3): Resolved-remains on IV insulin infusion as she is on steroids.  Will need to transition to SQ insulin once she no longer requires IV insulin.  Subclinical hyperthyroidism: Prior physician discussed with endocrinology-recommendations are for outpatient follow-up.  Thyrotropin receptor antibody negative.  SVT: Continue telemetry monitoring-appreciate cardiology input.  Echo with preserved EF  32 weeks intrauterine pregnancy with probable preeclampsia: Deferred to the primary service  Obesity: Estimated body mass index is 36.84 kg/m as calculated from the following:   Height as of this encounter: 6' (1.829 m).   Weight as of this encounter: 123.2 kg.   ABG:    Component Value Date/Time   HCO3 13.7 (L) 05/29/2019 1746   ACIDBASEDEF 11.2 (H) 05/29/2019 1746   O2SAT 88.5 05/29/2019 1746   Vent Settings: N/A  Condition -  Guarded  Family Communication  : Patient prefers to update family herself-I have asked to let me know if family has any Covid related questions.  Code Status :  Full Code  Diet :  Diet Order            Diet gestational carb mod Room service appropriate? Yes; Fluid consistency: Thin  Diet effective now               Disposition Plan  :   Per primary service  Antimicorbials  :    Anti-infectives (From admission, onward)   Start     Dose/Rate Route Frequency Ordered Stop   05/30/19 1000  remdesivir 100 mg  in sodium chloride 0.9 % 100 mL IVPB  Status:  Discontinued     100 mg 200 mL/hr over 30 Minutes Intravenous Daily 05/29/19 1345 05/29/19 1347   05/30/19 1000  remdesivir 100 mg in sodium chloride 0.9 % 100 mL IVPB     100 mg 200 mL/hr over 30 Minutes Intravenous Daily 05/29/19 1348 06/03/19 0959   05/29/19 1400  remdesivir 200 mg in sodium chloride 0.9% 250 mL IVPB     200 mg 580 mL/hr over 30 Minutes Intravenous Once 05/29/19 1348 05/29/19 1602   05/29/19 1345  remdesivir 200 mg in sodium chloride 0.9% 250 mL IVPB  Status:  Discontinued     200 mg 580 mL/hr over 30 Minutes Intravenous Once 05/29/19 1345 05/29/19 1347      Inpatient Medications  Scheduled Meds: . albuterol  2 puff Inhalation Q6H  . betamethasone acetate-betamethasone sodium phosphate  12 mg Intramuscular Q24 Hr x 2  . Chlorhexidine Gluconate Cloth  6 each Topical Daily  . clotrimazole  1 Applicatorful Vaginal QHS  . docusate sodium  100 mg Oral Daily  . enoxaparin (LOVENOX) injection  60 mg Subcutaneous Q24H  . prenatal multivitamin  1 tablet Oral Q1200  . sodium chloride flush  10-40 mL Intracatheter Q12H  . sodium chloride flush  3 mL Intravenous Q12H   Continuous Infusions: . albumin human    . dextrose 5 % and 0.45% NaCl 125 mL/hr at 06/01/19 0536  . insulin 9.5 Units/hr (06/01/19 0933)  . remdesivir 100 mg in NS 100 mL 100 mg (06/01/19 0928)   PRN Meds:.[DISCONTINUED] acetaminophen **OR** acetaminophen, acetaminophen, bisacodyl, calcium carbonate, dextrose, ondansetron **OR** ondansetron (ZOFRAN) IV, polyethylene glycol, promethazine, sodium chloride flush, zolpidem   Time Spent in minutes  25  See all Orders from today for further details   Jeoffrey Massed M.D on 06/01/2019 at 9:40 AM  To page go to [email or web address] - use universal password  [facility] -  Office  [phone]    Objective:   Vitals:   06/01/19 0725 06/01/19 0800 06/01/19 0900 06/01/19 0930  BP: 131/80     Pulse: 90       Resp: (!) 21     Temp: 97.9 F (36.6 C)     TempSrc: Oral     SpO2: 98% 98% 96% 95%  Weight:      Height:        Wt Readings from Last 3 Encounters:  05/29/19 123.2 kg  05/21/19 (!) 136.5 kg  05/18/19 (!) 136.5 kg     Intake/Output Summary (Last 24 hours) at 06/01/2019 0940 Last data filed at 06/01/2019 0101 Gross per 24 hour  Intake 1350.24 ml  Output 1000 ml  Net 350.24 ml     Physical Exam Gen Exam:Alert awake-not in any distress HEENT:atraumatic, normocephalic Chest: Few scattered rhonchi CVS:S1S2 regular Abdomen:soft non tender, non distended Extremities:no edema Neurology: Non focal Skin: no rash   Data Review:    CBC Recent Labs  Lab 05/29/19 1247 05/30/19 0704 05/31/19 0510 06/01/19 0314  WBC 5.8 6.4 7.3 5.6  HGB 11.6* 11.5* 11.9* 11.7*  HCT 35.5* 35.4* 36.9 35.5*  PLT 266 233 194 187  MCV 85.5 86.1 85.6 84.5  MCH 28.0 28.0 27.6 27.9  MCHC 32.7 32.5 32.2 33.0  RDW 13.5 13.6 13.6 13.4  LYMPHSABS 0.8 1.0 1.1 1.4  MONOABS 0.3 0.4 0.6 0.4  EOSABS 0.0 0.1 0.1 0.1  BASOSABS 0.0 0.0 0.0 0.0    Chemistries  Recent Labs  Lab 05/29/19 1247 05/29/19 1718 05/30/19 0704 05/30/19 0704 05/30/19 1954 05/30/19 1954 05/31/19 0510 05/31/19 1800 05/31/19 2334 06/01/19 0314 06/01/19 0738  NA 147*   < > 150*   < > 147*   < > 145 146* 144 146* 144  K 4.1   < > 3.4*   < > 3.4*   < > 3.3* 3.5 3.1* 3.0* 3.3*  CL 123*   < > 127*   < > 122*   < > 118* 115* 116* 115* 114*  CO2 11*   < > 14*   < > 15*   < > 15* 19* 19* 23 22  GLUCOSE 232*   < > 132*   < > 116*   < > 121* 113* 132* 106* 141*  BUN 12   < > 9   < > 10   < > 9 9 8 8 8   CREATININE 0.96   < > 1.03*   < > 0.87   < > 0.97 0.89 0.80 0.93 0.81  CALCIUM 10.2   < > 10.5*   < > 10.2   < > 10.0 10.0 9.0 9.2 9.0  MG 2.0  --   --   --  1.7  --  1.7  --   --   --   --   AST 44*  --  51*  --   --   --  48*  --   --  48*  --   ALT 51*  --  51*  --   --   --  51*  --   --  50*  --   ALKPHOS 100  --  99  --    --   --  105  --   --  101  --   BILITOT 1.2  --  0.9  --   --   --  0.9  --   --  0.6  --    < > = values in this interval not displayed.   ------------------------------------------------------------------------------------------------------------------ No results for input(s): CHOL, HDL, LDLCALC, TRIG, CHOLHDL, LDLDIRECT in the last 72 hours.  Lab Results  Component Value Date   HGBA1C 7.3 (H) 05/29/2019   ------------------------------------------------------------------------------------------------------------------ Recent Labs    05/30/19 1954  TSH 0.123*   ------------------------------------------------------------------------------------------------------------------ Recent Labs    05/29/19 1247 05/30/19 0704  FERRITIN 63 63    Coagulation profile No results for input(s): INR, PROTIME in the last 168 hours.  Recent Labs    05/29/19 1247  DDIMER 1.32*    Cardiac Enzymes No results for input(s): CKMB, TROPONINI, MYOGLOBIN in the last 168 hours.  Invalid input(s): CK ------------------------------------------------------------------------------------------------------------------    Component Value Date/Time   BNP 26.1 05/29/2019 1247    Micro Results Recent Results (from the past 240 hour(s))  Urine culture     Status: Abnormal   Collection Time: 05/24/19  9:27 PM   Specimen: Urine, Random  Result Value Ref Range Status   Specimen Description URINE, RANDOM  Final   Special Requests NONE  Final   Culture (A)  Final    30,000 COLONIES/mL LACTOBACILLUS SPECIES Standardized susceptibility testing for this organism is not available. Performed at [facility], [address], [place], [place] [address]    Report Status 05/25/2019 FINAL  Final  OB Urine Culture     Status: Abnormal   Collection Time: 05/29/19 12:08 PM   Specimen: OB Clean Catch; Urine  Result Value Ref Range Status   Specimen Description OB CLEAN CATCH  Final   Special Requests  NONE  Final   Culture (A)  Final    <10,000 COLONIES/mL INSIGNIFICANT GROWTH NO GROUP B STREP (S.AGALACTIAE) ISOLATED Performed at [facility], [address] [place], [place] [address]    Report Status 05/30/2019 FINAL  Final    Radiology Reports DG Chest Port 1 View  Result Date: 06/01/2019 CLINICAL DATA:  COVID-19 positive 05/18/2019, PICC placement EXAM: PORTABLE CHEST 1 VIEW COMPARISON:  05/31/2019 at 3:53 p.m. FINDINGS: Single frontal view of the chest again demonstrates left-sided PICC extending over a left-sided mediastinum, in a configuration suggesting persistent left-sided SVC. No significant change since prior study. Cardiac silhouette is stable. Multifocal bilateral airspace disease greatest in the left mid and right lower lung zones unchanged. No effusion or pneumothorax. IMPRESSION: 1. Stable position of the left-sided PICC line, suggesting persistent left-sided superior vena cava. Please correlate with catheter function. 2. Stable multifocal pneumonia. Electronically Signed   By: Sharlet Salina M.D.   On: 06/01/2019 01:13   DG CHEST PORT 1 VIEW  Addendum Date: 05/31/2019   ADDENDUM REPORT: 05/31/2019 16:25 ADDENDUM: It should be noted left SVC typically drains into the coronary sinus and subsequently the right atrium as suggested on recent echocardiogram. Current catheter position does not reach the right atrium and is satisfactory for usage. Electronically Signed   By: Alcide Clever M.D.   On: 05/31/2019 16:25   Result Date: 05/31/2019 CLINICAL DATA:  Status post PICC line placement EXAM: PORTABLE CHEST 1 VIEW COMPARISON:  05/29/2019 FINDINGS: Cardiac shadow is again mildly prominent. New left-sided PICC line is seen with the catheter tip presenting over the mid cardiac shadow. Recent echocardiography suggested a left superior vena cava and the catheter position with support this  diagnosis. The lungs are well aerated bilaterally with patchy opacities consistent with the  given clinical history of COVID-19 positivity. No bony abnormality is seen. IMPRESSION: PICC line placement with the catheter tip projecting over the cardiac shadow to the left of the midline likely within a persistent left superior vena cava. Scattered opacities bilaterally consistent with the given clinical history. Electronically Signed: By: Alcide Clever M.D. On: 05/31/2019 16:12   DG CHEST PORT 1 VIEW  Result Date: 05/29/2019 CLINICAL DATA:  Shortness of breath and cough. COVID-19 positive. EXAM: PORTABLE CHEST 1 VIEW COMPARISON:  May 22, 2019 FINDINGS: Cardiomediastinal silhouette is mildly enlarged. Interval development of patchy bilateral airspace opacities with lower lobe predominance. Osseous structures are without acute abnormality. Soft tissues are grossly normal. IMPRESSION: 1. Interval development of patchy bilateral airspace opacities with lower lobe predominance consistent with atypical pneumonia. 2. Enlarged cardiac silhouette. Electronically Signed   By: Ted Mcalpine M.D.   On: 05/29/2019 12:13   DG Chest Port 1 View  Result Date: 05/22/2019 CLINICAL DATA:  Shortness of breath.  COVID-19. EXAM: PORTABLE CHEST 1 VIEW COMPARISON:  08/07/2018 FINDINGS: The heart size and mediastinal contours are within normal limits. Both lungs are clear. The visualized skeletal structures are unremarkable. IMPRESSION: Clear lungs. Electronically Signed   By: Deatra Robinson M.D.   On: 05/22/2019 02:41   [place] MFM FETAL BPP WO NON STRESS  Result Date: 05/31/2019 ----------------------------------------------------------------------  OBSTETRICS REPORT                       (Signed Final 05/31/2019 06:01 pm) ---------------------------------------------------------------------- Patient Info  ID #:       [id number]                          D.O.B.:  January 26, 1995 (23 yrs)  Name:       Kelli Mcpherson              Visit Date: 05/31/2019 09:27 am ----------------------------------------------------------------------  Performed By  Attending:        Ma Rings MD         Ref. Address:     [facility]                                                             [address]  Jacky Kindle                                                             [phone]  Performed By:     Tommie Raymond BS,       Secondary Phy.:   [facility]                    RDMS, RVT                                                             Care  Referred By:      Candice Camp MD          [facility] ---------------------------------------------------------------------- Orders  #  Description                           Code        Ordered By  1  [place] MFM OB DETAIL +14 WK               76811.01    MEGAN MORRIS  2  [place] MFM UA CORD DOPPLER                76820.02    MEGAN MORRIS  3  [place] MFM FETAL BPP WO NON               76819.01    MEGAN MORRIS     STRESS ----------------------------------------------------------------------  #  Order #                     Accession #                Episode #  1  [id number]                   [id number]                 [id number]  2  [id number]                   [id number]                 [id number]  3  [id number]                   [id number]                 [id number] ---------------------------------------------------------------------- Indications  Gestational diabetes in pregnancy,             O24.415  controlled by oral hypoglycemic drugs, DKA  Oligohydraminios, third trimester, unspecified O41.03X0  [redacted] weeks gestation of pregnancy  Z3A.32  Obesity complicating pregnancy, third          O99.213  trimester  Unspecified maternal hypertension, third       O16.3  trimester  COVID19 (+)  (4/27)  Encounter for antenatal screening for           Z36.3  malformations  Encounter for other antenatal screening        Z36.2  follow-up ---------------------------------------------------------------------- Fetal Evaluation  Num Of Fetuses:         1  Fetal Heart Rate(bpm):  142  Cardiac Activity:       Observed  Presentation:           Cephalic  Placenta:               Anterior  P. Cord Insertion:      Not well visualized  Amniotic Fluid  AFI FV:      Oligohydramnios  AFI Sum(cm)     %Tile       Largest Pocket(cm)  5.3             < 3         2.6  RUQ(cm)       RLQ(cm)       LUQ(cm)        LLQ(cm)  1.8           0             0.9            2.6 ---------------------------------------------------------------------- Biophysical Evaluation  Amniotic F.V:   Pocket => 2 cm             F. Tone:        Not Observed  F. Movement:    Observed                   Score:          6/8  F. Breathing:   Observed ---------------------------------------------------------------------- Biometry  BPD:      82.8  mm     G. Age:  33w 2d         76  %    CI:        83.92   %    70 - 86                                                          FL/HC:      21.8   %    19.1 - 21.3  HC:      284.9  mm     G. Age:  31w 2d        4.3  %    HC/AC:      0.98        0.96 - 1.17  AC:       292   mm     G. Age:  33w 1d         79  %    FL/BPD:     75.1   %    71 - 87  FL:       62.2  mm     G. Age:  32w 2d         39  %    FL/AC:  21.3   %    20 - 24  HUM:        57  mm     G. Age:  33w 1d         71  %  CER:      41.3  mm     G. Age:  35w 2d         87  %  LV:        4.5  mm  Est. FW:    2036  gm      4 lb 8 oz     58  % ---------------------------------------------------------------------- OB History  Gravidity:    1 ---------------------------------------------------------------------- Gestational Age  LMP:           32w 1d        Date:  10/18/18                 EDD:   07/25/19  U/S Today:     32w 4d                                        EDD:   07/22/19  Best:          32w 1d     Det.  By:  LMP  (10/18/18)          EDD:   07/25/19 ---------------------------------------------------------------------- Anatomy  Cranium:               Not well visualized    LVOT:                   Appears normal  Cavum:                 Not well visualized    Aortic Arch:            Not well visualized  Ventricles:            Appears normal         Ductal Arch:            Appears normal  Choroid Plexus:        Appears normal         Diaphragm:              Appears normal  Cerebellum:            Not well visualized    Stomach:                Appears normal, left                                                                        sided  Posterior Fossa:       Not well visualized    Abdomen:                Appears normal  Nuchal Fold:           Not applicable (>20    Abdominal Wall:         Not well visualized  wks GA)  Face:                  Orbits nl; profile not Cord Vessels:           Appears normal (3                         well visualized                                vessel cord)  Lips:                  Not well visualized    Kidneys:                Appear normal  Palate:                Not well visualized    Bladder:                Appears normal  Thoracic:              Appears normal         Spine:                  Ltd views no                                                                        intracranial signs of                                                                        NTD  Heart:                 Appears normal         Upper Extremities:      Visualized                         (4CH, axis, and                         situs)  RVOT:                  Appears normal         Lower Extremities:      Visualized  Other:  Technically difficult due to maternal habitus and fetal position.          Technically difficult due to low amniotic fluid. ---------------------------------------------------------------------- Doppler - Fetal Vessels  Umbilical Artery   S/D     %tile       RI               PI                     ADFV    RDFV   2.96  67    0.66             0.88                        No      No ---------------------------------------------------------------------- Cervix Uterus Adnexa  Cervix  Not visualized (advanced GA >24wks)  Uterus  No abnormality visualized. ---------------------------------------------------------------------- Comments  This patient has been hospitalized due to diabetic  ketoacidosis and possible preeclampsia.  Her blood  pressures remain in the 120s to 150s over 80s to 90s range.  Her blood glucose levels are now in the normal range and her  diabetic ketoacidosis is resolving.  On today's exam, the overall fetal growth was appropriate for  her gestational age.  Oligohydramnios continues to be noted  today.  Doppler studies of the umbilical arteries performed today  shows a normal S/D ratio.  There were no signs of absent or  reversed end-diastolic flow.  A biophysical profile performed today was 6 out of 8.  She  received a -2 for absent fetal tone.  Her 24-hour urine results are currently pending.  Based on  her elevated blood pressures and her elevated P/C ratio, she  probably is developing preeclampsia.  Due to probable preeclampsia at her current gestational age,  now that her diabetic ketoacidosis is resolving, I would  recommend that she receive a complete course of antenatal  corticosteroids.  She should be kept on IV insulin while she is  receiving her steroid course.  She should have another ultrasound later this week to  reassess the amniotic fluid levels. ----------------------------------------------------------------------                   Ma Rings, MD Electronically Signed Final Report   05/31/2019 06:01 pm ----------------------------------------------------------------------  [place] MFM FETAL BPP WO NON STRESS  Result Date: 05/30/2019 ----------------------------------------------------------------------  OBSTETRICS REPORT                        (Signed Final 05/30/2019 05:11 pm) ---------------------------------------------------------------------- Patient Info  ID #:       [id number]                          D.O.B.:  Oct 30, 1995 (23 yrs)  Name:       Kelli Mcpherson              Visit Date: 05/30/2019 09:39 am ---------------------------------------------------------------------- Performed By  Attending:        Ma Rings MD         Ref. Address:     [facility]                                                             [address]  [address]                                                             [place]                                                             [phone]  Performed By:     Jacob Moores BS,       Secondary Phy.:   [facility]                    RDMS, RVT                                                             Care  Referred By:      Louretta Shorten MD          [facility] ---------------------------------------------------------------------- Orders  #  Description                           Code        Ordered By  1  [place] MFM FETAL BPP WO NON               71062.69    MEGAN MORRIS     STRESS ----------------------------------------------------------------------  #  Order #                     Accession #                Episode #  1  [id number]                   [id number]                 [id number] ---------------------------------------------------------------------- Indications  Oligohydraminios, third trimester, unspecified O41.03X0  Gestational diabetes in pregnancy,             O24.415  controlled by oral hypoglycemic drugs, DKA  Obesity complicating pregnancy, third          O99.213  trimester  Unspecified maternal hypertension, third       O16.3  trimester  COVID19 (+)  (4/27)  [redacted] weeks gestation  of pregnancy                Z3A.31 ---------------------------------------------------------------------- Fetal Evaluation  Num Of Fetuses:         1  Fetal Heart Rate(bpm):  166  Cardiac Activity:  Observed  Presentation:           Cephalic  Placenta:               Anterior  P. Cord Insertion:      Not well visualized  Amniotic Fluid  AFI FV:      Oligohydramnios  AFI Sum(cm)     %Tile       Largest Pocket(cm)  2.2             < 3         1.1  RUQ(cm)       RLQ(cm)       LUQ(cm)        LLQ(cm)  1.1           0             1.1            0 ---------------------------------------------------------------------- Biophysical Evaluation  Amniotic F.V:   Oligohydramnios            F. Tone:        Observed  F. Movement:    Not Observed               Score:          4/8  F. Breathing:   Observed ---------------------------------------------------------------------- OB History  Gravidity:    1 ---------------------------------------------------------------------- Gestational Age  LMP:           32w 0d        Date:  10/18/18                 EDD:   07/25/19  Best:          Armida Sans 0d     Det. By:  LMP  (10/18/18)          EDD:   07/25/19 ---------------------------------------------------------------------- Anatomy  Diaphragm:             Appears normal         Kidneys:                Appear normal  Stomach:               Appears normal, left   Bladder:                Appears normal                         sided  Other:  Technically difficult due to low amniotic fluid. Technically difficult due          to maternal habitus and fetal position. ---------------------------------------------------------------------- Cervix Uterus Adnexa  Cervix  Not visualized (advanced GA >24wks)  Uterus  No abnormality visualized.  Right Ovary  Within normal limits. No adnexal mass visualized.  Left Ovary  Within normal limits. No adnexal mass visualized.  Cul De Sac  No free fluid seen.  Adnexa  No abnormality visualized.  ---------------------------------------------------------------------- Comments  This patient has been hospitalized due to diabetic  ketoacidosis and persistent nausea and vomiting.  She also  tested positive for COVID-19 about 2 weeks ago.  She is  currently being treated with remdesivir for Covid pneumonia.  She has been receiving IV hydration and IV insulin to help  resolve the diabetic ketoacidosis.  This morning, her fetal heart rate tracing has improved.  There are periods of variability and accelerations.  No further  decelerations are noted.  The patient is also  more alert and  awake.  Her labs show hypernatremia with a sodium level of 150,  serum creatinine of 1.03, and AST and ALT that are mildly  elevated (51).  Her fingerstick values are now in the 110s to  120s range.  Her blood pressures remain in the 140s over  80s range.  A biophysical profile performed today was 4 out of 8.  The  patient received -2 for absent fetal movements and also -2 for  anhydramnios.  I reviewed the patient's ultrasound images from yesterday.  A  >2 cm pocket of amniotic fluid was noted yesterday.  Due to her symptoms of persistent nausea and vomiting, her  elevated blood pressures, her elevated liver function tests,  and her elevated serum creatinine level, a diagnosis of  preeclampsia has not been ruled out.  A P/C ratio performed this afternoon was 0.86 (elevated).  A  24-hour urine collection is underway to assess the total  protein.  A course of antenatal corticosteroids for fetal lung maturity  has been held due to concerns that it may elevate her blood  glucose levels and further worsen her diabetic ketoacidosis.  The patient should be kept on continuous monitoring  overnight.  I would recommend delivery should the fetal  status be nonreassuring.  Should her diabetic ketoacidosis continue to resolve and her  blood pressures remain elevated therefore increasing the  suspicion for preeclampsia, a course of antenatal   corticosteroids should be administered tomorrow morning.  The patient will continue to receive IV hydration overnight.  We will repeat another ultrasound tomorrow to assess the  amniotic fluid levels, the fetal growth, and umbilical artery  Doppler studies. ----------------------------------------------------------------------                   Ma Rings, MD Electronically Signed Final Report   05/30/2019 05:11 pm ----------------------------------------------------------------------  [place] MFM FETAL BPP WO NON STRESS  Result Date: 05/29/2019 ----------------------------------------------------------------------  OBSTETRICS REPORT                       (Signed Final 05/29/2019 12:15 pm) ---------------------------------------------------------------------- Patient Info  ID #:       [id number]                          D.O.B.:  November 16, 1995 (23 yrs)  Name:       Kelli Mcpherson              Visit Date: 05/29/2019 09:58 am ---------------------------------------------------------------------- Performed By  Attending:        Lin Landsman      Ref. Address:      [facility]                                                              [facility]  [address]                                                              [place]                                                              [address]  Performed By:     Birdena Crandall        [facility]  Referred By:      Candice Camp MD ---------------------------------------------------------------------- Orders  #  Description                           Code        Ordered By  1  [place] MFM FETAL BPP WO NON               60454.09    DAVID LOWE     STRESS ----------------------------------------------------------------------  #   Order #                     Accession #                Episode #  1  [id number]                   [id number]                 [id number] ---------------------------------------------------------------------- Indications  Gestational diabetes in pregnancy,              O24.415  controlled by oral hypoglycemic drugs, DKA  Obesity complicating pregnancy, third           O99.213  trimester  Unspecified maternal hypertension, third        O16.3  trimester  COVID19 (+)  (4/27)  Oligohydraminios, third trimester, unspecified  O41.03X0  [redacted] weeks gestation of pregnancy                 Z3A.31 ---------------------------------------------------------------------- Fetal Evaluation  Num Of Fetuses:          1  Fetal Heart Rate(bpm):   150  Cardiac Activity:        Observed  Presentation:            Cephalic  Placenta:  Anterior  Amniotic Fluid  AFI FV:      Within normal limits  AFI Sum(cm)     %Tile       Largest Pocket(cm)  9.5             12          2.8  RUQ(cm)       RLQ(cm)       LUQ(cm)        LLQ(cm)  2.8           2.5           2.8            1.4 ---------------------------------------------------------------------- Biophysical Evaluation  Amniotic F.V:   Within normal limits       F. Tone:         Observed  F. Movement:    Not Observed               Score:           4/8  F. Breathing:   Not Observed ---------------------------------------------------------------------- OB History  Gravidity:    1 ---------------------------------------------------------------------- Gestational Age  LMP:           31w 6d        Date:  10/18/18                 EDD:   07/25/19  Best:          31w 6d     Det. By:  LMP  (10/18/18)          EDD:   07/25/19 ---------------------------------------------------------------------- Anatomy  Stomach:               Appears normal, left   Kidneys:                Appear normal                         sided  Abdominal Wall:        Appears nml (cord      Bladder:                Appears  normal                         insert, abd wall) ---------------------------------------------------------------------- Impression  Limited exam due to presumed maternal DKA  Biophysical profile 4 out of 8 there is some movement but not  enough to pass the BPP.  Labs are pending with continued fetal monitoring.  I discussed with Dr. Rana Snare the current plan and agree. ---------------------------------------------------------------------- Recommendations  Continueed Fetal monitoring  Hold Betamethsone  DKA protocol including insulin drip and fluid hydration with  the goal of blood sugars 100-140's.  Will await labs to perform consultation and diagnosis of DKA.  Continue COVID protocol. ----------------------------------------------------------------------               Lin Landsman, MD Electronically Signed Final Report   05/29/2019 12:15 pm ----------------------------------------------------------------------  [place] MFM OB DETAIL +14 WK  Result Date: 05/31/2019 ----------------------------------------------------------------------  OBSTETRICS REPORT                       (Signed Final 05/31/2019 06:01 pm) ---------------------------------------------------------------------- Patient Info  ID #:       [id number]  D.O.B.:  07/26/1995 (23 yrs)  Name:       Kelli Mcpherson              Visit Date: 05/31/2019 09:27 am ---------------------------------------------------------------------- Performed By  Attending:        Ma Rings MD         Ref. Address:     [facility]                                                             [address]                                                             [place]                                                             [phone]  Performed By:     Tommie Raymond BS,       Secondary Phy.:   [facility]                     [facility]                                                             [facility]  Referred By:      Candice Camp MD          [facility] ---------------------------------------------------------------------- Orders  #  Description  Code        Ordered By  1  [place] MFM OB DETAIL +14 [place]               [id number]    MEGAN MORRIS  2  [place] MFM UA CORD DOPPLER                76820.02    MEGAN MORRIS  3  [place] MFM FETAL BPP WO NON               76819.01    MEGAN MORRIS     STRESS ----------------------------------------------------------------------  #  Order #                     Accession #                Episode #  1  [id number]                   [id number]                 [id number]  2  [id number]                   [id number]                 [id number]  3  [id number]                   [id number]                 [id number] ---------------------------------------------------------------------- Indications  Gestational diabetes in pregnancy,             O24.415  controlled by oral hypoglycemic drugs, DKA  Oligohydraminios, third trimester, unspecified O41.03X0  [redacted] weeks gestation of pregnancy                I3J.82  Obesity complicating pregnancy, third          O99.213  trimester  Unspecified maternal hypertension, third       O16.3  trimester  COVID19 (+)  (4/27)  Encounter for antenatal screening for          Z36.3  malformations  Encounter for other antenatal screening        Z36.2  follow-up ---------------------------------------------------------------------- Fetal Evaluation  Num Of Fetuses:         1  Fetal Heart Rate(bpm):  142  Cardiac Activity:       Observed  Presentation:           Cephalic  Placenta:               Anterior  P. Cord Insertion:      Not well visualized  Amniotic Fluid  AFI FV:      Oligohydramnios  AFI Sum(cm)     %Tile       Largest Pocket(cm)  5.3             < 3         2.6   RUQ(cm)       RLQ(cm)       LUQ(cm)        LLQ(cm)  1.8           0             0.9            2.6 ---------------------------------------------------------------------- Biophysical Evaluation  Amniotic F.V:   Pocket => 2 cm  F. Tone:        Not Observed  F. Movement:    Observed                   Score:          6/8  F. Breathing:   Observed ---------------------------------------------------------------------- Biometry  BPD:      82.8  mm     G. Age:  33w 2d         76  %    CI:        83.92   %    70 - 86                                                          FL/HC:      21.8   %    19.1 - 21.3  HC:      284.9  mm     G. Age:  31w 2d        4.3  %    HC/AC:      0.98        0.96 - 1.17  AC:       292   mm     G. Age:  33w 1d         79  %    FL/BPD:     75.1   %    71 - 87  FL:       62.2  mm     G. Age:  32w 2d         39  %    FL/AC:      21.3   %    20 - 24  HUM:        57  mm     G. Age:  33w 1d         71  %  CER:      41.3  mm     G. Age:  35w 2d         87  %  LV:        4.5  mm  Est. FW:    2036  gm      4 lb 8 oz     58  % ---------------------------------------------------------------------- OB History  Gravidity:    1 ---------------------------------------------------------------------- Gestational Age  LMP:           32w 1d        Date:  10/18/18                 EDD:   07/25/19  U/S Today:     32w 4d                                        EDD:   07/22/19  Best:          32w 1d     Det. By:  LMP  (10/18/18)          EDD:   07/25/19 ---------------------------------------------------------------------- Anatomy  Cranium:               Not well visualized    LVOT:  Appears normal  Cavum:                 Not well visualized    Aortic Arch:            Not well visualized  Ventricles:            Appears normal         Ductal Arch:            Appears normal  Choroid Plexus:        Appears normal         Diaphragm:              Appears normal  Cerebellum:            Not well  visualized    Stomach:                Appears normal, left                                                                        sided  Posterior Fossa:       Not well visualized    Abdomen:                Appears normal  Nuchal Fold:           Not applicable (>20    Abdominal Wall:         Not well visualized                         wks GA)  Face:                  Orbits nl; profile not Cord Vessels:           Appears normal (3                         well visualized                                vessel cord)  Lips:                  Not well visualized    Kidneys:                Appear normal  Palate:                Not well visualized    Bladder:                Appears normal  Thoracic:              Appears normal         Spine:                  Ltd views no  intracranial signs of                                                                        NTD  Heart:                 Appears normal         Upper Extremities:      Visualized                         (4CH, axis, and                         situs)  RVOT:                  Appears normal         Lower Extremities:      Visualized  Other:  Technically difficult due to maternal habitus and fetal position.          Technically difficult due to low amniotic fluid. ---------------------------------------------------------------------- Doppler - Fetal Vessels  Umbilical Artery   S/D     %tile      RI               PI                     ADFV    RDFV   2.96       67    0.66             0.88                        No      No ---------------------------------------------------------------------- Cervix Uterus Adnexa  Cervix  Not visualized (advanced GA >24wks)  Uterus  No abnormality visualized. ---------------------------------------------------------------------- Comments  This patient has been hospitalized due to diabetic  ketoacidosis and possible preeclampsia.  Her blood  pressures remain in  the 120s to 150s over 80s to 90s range.  Her blood glucose levels are now in the normal range and her  diabetic ketoacidosis is resolving.  On today's exam, the overall fetal growth was appropriate for  her gestational age.  Oligohydramnios continues to be noted  today.  Doppler studies of the umbilical arteries performed today  shows a normal S/D ratio.  There were no signs of absent or  reversed end-diastolic flow.  A biophysical profile performed today was 6 out of 8.  She  received a -2 for absent fetal tone.  Her 24-hour urine results are currently pending.  Based on  her elevated blood pressures and her elevated P/C ratio, she  probably is developing preeclampsia.  Due to probable preeclampsia at her current gestational age,  now that her diabetic ketoacidosis is resolving, I would  recommend that she receive a complete course of antenatal  corticosteroids.  She should be kept on IV insulin while she is  receiving her steroid course.  She should have another ultrasound later this week to  reassess the amniotic fluid levels. ----------------------------------------------------------------------                   Ma Rings, MD  Electronically Signed Final Report   05/31/2019 06:01 pm ----------------------------------------------------------------------  ECHOCARDIOGRAM COMPLETE  Result Date: 05/31/2019    ECHOCARDIOGRAM REPORT   Patient Name:   Kelli Mcpherson Date of Exam: 05/31/2019 Medical Rec #:  [id number]          Height:       72.0 in Accession #:    [id number]         Weight:       271.6 lb Date of Birth:  1995-05-23          BSA:          2.427 m Patient Age:    23 years           BP:           133/68 mmHg Patient Gender: F                  HR:           109 bpm. Exam Location:  Inpatient Procedure: 2D Echo, Color Doppler and Cardiac Doppler Indications:    I47.1 SVT  History:        Patient has no prior history of Echocardiogram examinations.                 COVID+ 05/17/19                 [redacted]  Weeks Pregnant at time of study.  Sonographer:    Irving Burton Senior RDCS Referring Phys: [id number] DAWOOD S Randol Kern  Sonographer Comments: Technically difficult due to patient body habitus. IMPRESSIONS  1. Left ventricular ejection fraction, by estimation, is 50 to 55%. The left ventricle has low normal function. The left ventricle demonstrates global hypokinesis. Indeterminate diastolic filling due to E-A fusion. There is abnormal (paradoxical) septal  motion, consistent with right ventricular volume overload.  2. Right ventricular systolic function is normal. The right ventricular size is normal. Tricuspid regurgitation signal is inadequate for assessing PA pressure.  3. 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 The inferior vena cava is also dilated, but the right heart chambers are not enlarged.  4. The mitral valve is normal in structure. No evidence of mitral valve regurgitation. No evidence of mitral stenosis.  5. The aortic valve is normal in structure. Aortic valve regurgitation is not visualized. No aortic stenosis is present.  6. The inferior vena cava is dilated in size with <50% respiratory variability, suggesting right atrial pressure of 15 mmHg. Comparison(s): No prior Echocardiogram. Conclusion(s)/Recommendation(s): Recommend a saline contrast injection from a vein in the left upper extremity, with imaging of the coronary sinus. FINDINGS  Left Ventricle: Left ventricular ejection fraction, by estimation, is 50 to 55%. The left ventricle has low normal function. The left ventricle demonstrates global hypokinesis. The left ventricular internal cavity size was normal in size. There is no left ventricular hypertrophy. Abnormal (paradoxical) septal motion, consistent with right ventricular volume overload. Indeterminate diastolic filling due to E-A fusion. Right Ventricle: The right ventricular size is normal. No increase in right ventricular wall thickness. Right  ventricular systolic function is normal. Tricuspid regurgitation signal is inadequate for assessing PA pressure. Left Atrium: Left atrial size was normal in size. 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 The inferior vena cava is also dilated, but the right heart chambers are not enlarged. Right atrial size was normal in size. Pericardium: There is no evidence of pericardial effusion. Mitral Valve:  The mitral valve is normal in structure. Normal mobility of the mitral valve leaflets. No evidence of mitral valve regurgitation. No evidence of mitral valve stenosis. Tricuspid Valve: The tricuspid valve is normal in structure. Tricuspid valve regurgitation is not demonstrated. No evidence of tricuspid stenosis. Aortic Valve: The aortic valve is normal in structure. Aortic valve regurgitation is not visualized. No aortic stenosis is present. Pulmonic Valve: The pulmonic valve was normal in structure. Pulmonic valve regurgitation is not visualized. No evidence of pulmonic stenosis. Aorta: The aortic root is normal in size and structure. Venous: The inferior vena cava is dilated in size with less than 50% respiratory variability, suggesting right atrial pressure of 15 mmHg. IAS/Shunts: No atrial level shunt detected by color flow Doppler.  LEFT VENTRICLE PLAX 2D LVIDd:         5.00 cm LVIDs:         3.80 cm LV PW:         1.10 cm LV IVS:        1.40 cm LVOT diam:     2.00 cm LV SV:         56 LV SV Index:   23 LVOT Area:     3.14 cm  RIGHT VENTRICLE RV S prime:     12.20 cm/s TAPSE (M-mode): 1.8 cm LEFT ATRIUM           Index       RIGHT ATRIUM           Index LA diam:      3.20 cm 1.32 cm/m  RA Area:     17.60 cm LA Vol (A2C): 45.3 ml 18.67 ml/m RA Volume:   51.10 ml  21.06 ml/m LA Vol (A4C): 47.7 ml 19.66 ml/m  AORTIC VALVE LVOT Vmax:   104.00 cm/s LVOT Vmean:  79.700 cm/s LVOT VTI:    0.179 m  AORTA Ao Root diam: 3.30 cm Ao Asc diam:  2.80 cm  SHUNTS  Systemic VTI:  0.18 m Systemic Diam: 2.00 cm Thurmon Fair MD Electronically signed by Thurmon Fair MD Signature Date/Time: 05/31/2019/9:09:24 AM    Final    [place] EKG SITE RITE  Result Date: 05/31/2019 If Site Rite image not attached, placement could not be confirmed due to current cardiac rhythm.  [place] MFM UA CORD DOPPLER  Result Date: 05/31/2019 ----------------------------------------------------------------------  OBSTETRICS REPORT                       (Signed Final 05/31/2019 06:01 pm) ---------------------------------------------------------------------- Patient Info  ID #:       [id number]                          D.O.B.:  [birth year] (23 yrs)  Name:       Kelli Mcpherson              Visit Date: 05/31/2019 09:27 am ---------------------------------------------------------------------- Performed By  Attending:        Ma Rings MD         Ref. Address:     [facility]  [address]                                                             [place]                                                             [address]  Performed By:     Tommie Raymond BS,       Secondary Phy.:   [facility]                    [facility]                                                             [facility]  Referred By:      Candice Camp MD          [facility] ---------------------------------------------------------------------- Orders  #  Description                           Code        Ordered By  1  [place] MFM OB DETAIL +14 WK               76811.01    MEGAN MORRIS  2  [place] MFM UA CORD DOPPLER                76820.02    MEGAN MORRIS  3  [place] MFM FETAL BPP WO NON               60454.09    MEGAN MORRIS     STRESS  ----------------------------------------------------------------------  #  Order #                     Accession #                Episode #  1  [id number]                   [id number]  [id number]  2  [id number]                   [id number]  3  [id number]                   [id number] ---------------------------------------------------------------------- Indications  Gestational diabetes in pregnancy,             O24.415  controlled by oral hypoglycemic drugs, DKA  Oligohydraminios, third trimester, unspecified O41.03X0  [redacted] weeks gestation of pregnancy                Z3A.32  Obesity complicating pregnancy, third          O99.213  trimester  Unspecified maternal hypertension, third       O16.3  trimester  COVID19 (+)  (4/27)  Encounter for antenatal screening for          Z36.3  malformations  Encounter for other antenatal screening        Z36.2  follow-up ---------------------------------------------------------------------- Fetal Evaluation  Num Of Fetuses:         1  Fetal Heart Rate(bpm):  142  Cardiac Activity:       Observed  Presentation:           Cephalic  Placenta:               Anterior  P. Cord Insertion:      Not well visualized  Amniotic Fluid  AFI FV:      Oligohydramnios  AFI Sum(cm)     %Tile       Largest Pocket(cm)  5.3             < 3         2.6  RUQ(cm)       RLQ(cm)       LUQ(cm)        LLQ(cm)  1.8           0             0.9            2.6 ---------------------------------------------------------------------- Biophysical Evaluation  Amniotic F.V:   Pocket => 2 cm             F. Tone:        Not Observed  F. Movement:    Observed                   Score:          6/8  F. Breathing:   Observed ---------------------------------------------------------------------- Biometry  BPD:      82.8  mm     G. Age:  33w 2d         76  %    CI:        83.92   %    70 - 86                                                          FL/HC:      21.8    %    19.1 - 21.3  HC:      284.9  mm     G. Age:  31w 2d        4.3  %    HC/AC:      0.98        0.96 - 1.17  AC:       292   mm     G. Age:  33w 1d         79  %    FL/BPD:     75.1   %    71 - 87  FL:       62.2  mm     G. Age:  32w 2d         39  %    FL/AC:      21.3   %    20 - 24  HUM:        57  mm     G. Age:  33w 1d         71  %  CER:      41.3  mm     G. Age:  35w 2d         87  %  LV:        4.5  mm  Est. FW:    2036  gm      4 lb 8 oz     58  % ---------------------------------------------------------------------- OB History  Gravidity:    1 ---------------------------------------------------------------------- Gestational Age  LMP:           32w 1d        Date:  10/18/18                 EDD:   07/25/19  U/S Today:     32w 4d                                        EDD:   07/22/19  Best:          32w 1d     Det. By:  LMP  (10/18/18)          EDD:   07/25/19 ---------------------------------------------------------------------- Anatomy  Cranium:               Not well visualized    LVOT:                   Appears normal  Cavum:                 Not well visualized    Aortic Arch:            Not well visualized  Ventricles:            Appears normal         Ductal Arch:            Appears normal  Choroid Plexus:        Appears normal         Diaphragm:              Appears normal  Cerebellum:            Not well visualized    Stomach:                Appears normal, left  sided  Posterior Fossa:       Not well visualized    Abdomen:                Appears normal  Nuchal Fold:           Not applicable (>20    Abdominal Wall:         Not well visualized                         wks GA)  Face:                  Orbits nl; profile not Cord Vessels:           Appears normal (3                         well visualized                                vessel cord)  Lips:                  Not well visualized    Kidneys:                Appear normal   Palate:                Not well visualized    Bladder:                Appears normal  Thoracic:              Appears normal         Spine:                  Ltd views no                                                                        intracranial signs of                                                                        NTD  Heart:                 Appears normal         Upper Extremities:      Visualized                         (4CH, axis, and                         situs)  RVOT:                  Appears normal         Lower Extremities:      Visualized  Other:  Technically difficult due to maternal habitus  and fetal position.          Technically difficult due to low amniotic fluid. ---------------------------------------------------------------------- Doppler - Fetal Vessels  Umbilical Artery   S/D     %tile      RI               PI                     ADFV    RDFV   2.96       67    0.66             0.88                        No      No ---------------------------------------------------------------------- Cervix Uterus Adnexa  Cervix  Not visualized (advanced GA >24wks)  Uterus  No abnormality visualized. ---------------------------------------------------------------------- Comments  This patient has been hospitalized due to diabetic  ketoacidosis and possible preeclampsia.  Her blood  pressures remain in the 120s to 150s over 80s to 90s range.  Her blood glucose levels are now in the normal range and her  diabetic ketoacidosis is resolving.  On today's exam, the overall fetal growth was appropriate for  her gestational age.  Oligohydramnios continues to be noted  today.  Doppler studies of the umbilical arteries performed today  shows a normal S/D ratio.  There were no signs of absent or  reversed end-diastolic flow.  A biophysical profile performed today was 6 out of 8.  She  received a -2 for absent fetal tone.  Her 24-hour urine results are currently pending.  Based on  her elevated blood  pressures and her elevated P/C ratio, she  probably is developing preeclampsia.  Due to probable preeclampsia at her current gestational age,  now that her diabetic ketoacidosis is resolving, I would  recommend that she receive a complete course of antenatal  corticosteroids.  She should be kept on IV insulin while she is  receiving her steroid course.  She should have another ultrasound later this week to  reassess the amniotic fluid levels. ----------------------------------------------------------------------                   Ma Rings, MD Electronically Signed Final Report   05/31/2019 06:01 pm ----------------------------------------------------------------------

## 2019-06-01 NOTE — Progress Notes (Signed)
Nutrition Consult: Phone conversation with pt  She  reports return of appetite and tolerating food well, except for dairy foods. Pt would like double protein portions. Reviewed typical meal/snack pattern. Pt was unaware that she should receive snacks TID. Pt understand glucose goals. Asked her to spread meals and snacks out with at least 2 hours between to help with glucose control. Reviewed CHO limits with her. She has been receiving high sugar items that are not typically on the GDM diet, icy's , juice. Suggested sugar free popsicle, diet jello as replacement. Encouraged pt to avoid high calorie drinks, desserts, candy.  Elisabeth Cara M.Odis Luster LDN Neonatal Nutrition Support Specialist/RD III

## 2019-06-01 NOTE — Progress Notes (Signed)
L DL PICC assessed, both ports  flushing well but no blood return. Site assessed for possible kinked of lines,none noted , dressing intact and dry. Caps changed, arms extended but still  without success. RN notified to request MD  For Xray for PICC placement verification and for possible TPA instillation as pt. is pregnant. RN Deztiny will call back this IV Vast Nurse for update.

## 2019-06-02 LAB — CBC WITH DIFFERENTIAL/PLATELET
Abs Immature Granulocytes: 0 10*3/uL (ref 0.00–0.07)
Basophils Absolute: 0 10*3/uL (ref 0.0–0.1)
Basophils Relative: 0 %
Eosinophils Absolute: 0.1 10*3/uL (ref 0.0–0.5)
Eosinophils Relative: 1 %
HCT: 36.1 % (ref 36.0–46.0)
Hemoglobin: 11.8 g/dL — ABNORMAL LOW (ref 12.0–15.0)
Lymphocytes Relative: 25 %
Lymphs Abs: 1.7 10*3/uL (ref 0.7–4.0)
MCH: 27.9 pg (ref 26.0–34.0)
MCHC: 32.7 g/dL (ref 30.0–36.0)
MCV: 85.3 fL (ref 80.0–100.0)
Monocytes Absolute: 0.4 10*3/uL (ref 0.1–1.0)
Monocytes Relative: 6 %
Neutro Abs: 4.6 10*3/uL (ref 1.7–7.7)
Neutrophils Relative %: 68 %
Platelets: 204 10*3/uL (ref 150–400)
RBC: 4.23 MIL/uL (ref 3.87–5.11)
RDW: 13.5 % (ref 11.5–15.5)
WBC: 6.7 10*3/uL (ref 4.0–10.5)
nRBC: 15 /100 WBC — ABNORMAL HIGH
nRBC: 3.6 % — ABNORMAL HIGH (ref 0.0–0.2)

## 2019-06-02 LAB — COMPREHENSIVE METABOLIC PANEL
ALT: 53 U/L — ABNORMAL HIGH (ref 0–44)
AST: 53 U/L — ABNORMAL HIGH (ref 15–41)
Albumin: 2.2 g/dL — ABNORMAL LOW (ref 3.5–5.0)
Alkaline Phosphatase: 105 U/L (ref 38–126)
Anion gap: 9 (ref 5–15)
BUN: 7 mg/dL (ref 6–20)
CO2: 20 mmol/L — ABNORMAL LOW (ref 22–32)
Calcium: 9.2 mg/dL (ref 8.9–10.3)
Chloride: 111 mmol/L (ref 98–111)
Creatinine, Ser: 0.86 mg/dL (ref 0.44–1.00)
GFR calc Af Amer: >60 mL/min (ref >60)
GFR calc non Af Amer: >60 mL/min (ref >60)
Glucose, Bld: 98 mg/dL (ref 70–99)
Potassium: 3.1 mmol/L — ABNORMAL LOW (ref 3.5–5.1)
Sodium: 140 mmol/L (ref 135–145)
Total Bilirubin: 0.6 mg/dL (ref 0.3–1.2)
Total Protein: 6.1 g/dL — ABNORMAL LOW (ref 6.5–8.1)

## 2019-06-02 LAB — GLUCOSE, CAPILLARY
Glucose-Capillary: 111 mg/dL — ABNORMAL HIGH (ref 70–99)
Glucose-Capillary: 112 mg/dL — ABNORMAL HIGH (ref 70–99)
Glucose-Capillary: 122 mg/dL — ABNORMAL HIGH (ref 70–99)
Glucose-Capillary: 123 mg/dL — ABNORMAL HIGH (ref 70–99)
Glucose-Capillary: 126 mg/dL — ABNORMAL HIGH (ref 70–99)
Glucose-Capillary: 127 mg/dL — ABNORMAL HIGH (ref 70–99)
Glucose-Capillary: 129 mg/dL — ABNORMAL HIGH (ref 70–99)
Glucose-Capillary: 131 mg/dL — ABNORMAL HIGH (ref 70–99)
Glucose-Capillary: 132 mg/dL — ABNORMAL HIGH (ref 70–99)
Glucose-Capillary: 135 mg/dL — ABNORMAL HIGH (ref 70–99)
Glucose-Capillary: 139 mg/dL — ABNORMAL HIGH (ref 70–99)
Glucose-Capillary: 143 mg/dL — ABNORMAL HIGH (ref 70–99)
Glucose-Capillary: 153 mg/dL — ABNORMAL HIGH (ref 70–99)
Glucose-Capillary: 155 mg/dL — ABNORMAL HIGH (ref 70–99)
Glucose-Capillary: 78 mg/dL (ref 70–99)
Glucose-Capillary: 80 mg/dL (ref 70–99)
Glucose-Capillary: 85 mg/dL (ref 70–99)
Glucose-Capillary: 87 mg/dL (ref 70–99)
Glucose-Capillary: 94 mg/dL (ref 70–99)
Glucose-Capillary: 94 mg/dL (ref 70–99)
Glucose-Capillary: 99 mg/dL (ref 70–99)

## 2019-06-02 LAB — C-REACTIVE PROTEIN: CRP: 4.5 mg/dL — ABNORMAL HIGH (ref <1.0)

## 2019-06-02 MED ORDER — MENTHOL 3 MG MT LOZG
1.0000 | LOZENGE | OROMUCOSAL | Status: DC | PRN
  Administered 2019-06-02: 3 mg via ORAL

## 2019-06-02 MED ORDER — POTASSIUM CHLORIDE CRYS ER 20 MEQ PO TBCR
40.0000 meq | EXTENDED_RELEASE_TABLET | Freq: Once | ORAL | Status: AC
  Administered 2019-06-02: 40 meq via ORAL
  Filled 2019-06-02: qty 2

## 2019-06-02 MED ORDER — PHENOL 1.4 % MT LIQD
1.0000 | OROMUCOSAL | Status: DC | PRN
  Administered 2019-06-02: 1 via OROMUCOSAL
  Filled 2019-06-02: qty 177

## 2019-06-02 NOTE — Progress Notes (Signed)
Inpatient Diabetes Program Recommendations  AACE/ADA: New Consensus Statement on Inpatient Glycemic Control (2015)  Target Ranges:  Prepandial:   less than 140 mg/dL      Peak postprandial:   less than 180 mg/dL (1-2 hours)      Critically ill patients:  140 - 180 mg/dL   Lab Results  Component Value Date   GLUCAP 143 (H) 06/02/2019   HGBA1C 7.3 (H) 05/29/2019    Review of Glycemic Control Results for BREONIA, KIRSTEIN" (MRN [id number]) as of 06/02/2019 11:57  Ref. Range 06/02/2019 07:43 06/02/2019 08:51 06/02/2019 10:03 06/02/2019 11:16  Glucose-Capillary Latest Ref Range: 70 - 99 mg/dL 85 94 409 (H) 811 (H)   Diabetes history:GDM Outpatient Diabetes medications:Metformin Current orders for Inpatient glycemic control:Endotool/DKA in preg  COVID + and DKA (31 weeks) BMZ x2   Inpatient Diabetes Program Recommendations:  Continue with IV insulin for 24 hours following second BMZ dose. Will plan to dose for transition on 5/14.  Per Dr Parke Poisson MFM on 5/11, "Due to probable preeclampsia at her current gestational age, now that her diabetic ketoacidosis is resolving, I would recommend that she receive a complete course of antenatal corticosteroids. She should be kept on IV insulin while she is receiving her steroid course." Attempted to reach patient to discuss plan by cell and room phone x 2. Patient aware; no answer.   Thanks, Lujean Rave, MSN, RNC-OB Diabetes Coordinator [phone] (8a-5p)

## 2019-06-02 NOTE — Progress Notes (Addendum)
Kelli Mcpherson is a 24 y.o. female patient. Today she reports feeling much better. We discussed her plan of care and her hospital course (which she doesn't remember very well). Currently doing well and wants to eat and be more active.  She is still on covid restrictions.   1. COVID-19   2. DKA (diabetic ketoacidoses) (HCC)   3. Diabetic keto-acidosis (HCC)   4. Diabetic ketoacidosis without coma associated with diabetes mellitus due to underlying condition (HCC)   5. COVID-19 affecting pregnancy in third trimester   6. PICC (peripherally inserted central catheter) in place   7. Probable Preeclampsia     05/12 0700  05/13 0659 05/13 0700  05/13 1057  Most Recent    Temp (F)    97.8-98.3 97.9  97.9 (36.6)  05/13 0757  Pulse Rate    82-115 101  101   05/13 0757  Resp    17-21 19  19   05/13 0757  BP    121/73-140/95 138/90  138/90  05/13 0757  SpO2 (%)    95-98 97  97  05/13 0757   FHR 140-150 occas accels.  No decels.  Physical Exam  A&O, up walking around,Reports GFM  Abd gravid nontender Neg homans bilateral  A/P: 24 yo G1P0 @ 32 3/7 ega presenting w/DKA in the setting of COVID + and poor diabetic control. She is more stable today and seems to be progressing. However, some concern for developing PIH given elevated Bps.   Probable PIH:  - Bps mild to moderate range - PIH labs w/stably elevated LFTs possibly s/s dehydration but otherwise no severe features - UPC elevated to 0.82 - 24 urine in 1.2grams protein in 24h - Magnesium IV prn development of severe features  BPP tomorrow - MFM following. BPP 6/8 5/11 with normal growth and oligohydramnios.  - DELIVERY is recommended should the fetal status be consistently nonreassuring per MFM. -BMZ series completed at 330 this am.  Continue Insulin drip at least 24 h post  DKA : resolving - Management per Hospitalist and Diabetic management.  - Continue albumin and insulin until BMZ complete - Encourage patient  to eat and drink  COVID +(April 27) - Management per IM - Remdesivir last dose due tomorrow

## 2019-06-02 NOTE — Progress Notes (Signed)
PROGRESS NOTE                                                                                                                                                                                                             Patient Demographics:    Kelli Mcpherson, is a 24 y.o. female, DOB - 06/20/1995, ZOX:[id number]  Outpatient Primary MD for the patient is Ranae PilaLeger, Elise Jennifer, MD   Admit date - 05/29/2019   LOS - 4  No chief complaint on file.      Brief Narrative: Patient is a 24 y.o. female with PMHx of gestational diabetes-on Metformin-who presented with DKA and COVID-19 pneumonia.  Hospitalist service consulted for management of these conditions.  Significant Events: 4/27>> Covid positive 5/2>> ED visit for S OB/nausea/vomiting and lower abdominal pain-given IV fluids and discharged home. 5/4>> ED visit with nausea/vomiting-discharged home 5/9>> admit for COVID-19 pneumonia, DKA, hospitalist service consulted.  COVID-19 medications: Steroids: 5/11>> Remdesivir: 5/9>>  Antibiotics: None  Microbiology data: 5/9: Urine culture: <10,000 colonies/mL insignificant growth, no group B strep isolated  DVT prophylaxis: SQ Lovenox  Procedures: None  Consults: None    Subjective:   Feels a lot better-hardly any cough this morning.  Has been on room air-with O2 saturations in the high 90s.   Assessment  & Plan :   Acute Hypoxic Resp Failure due to Covid 19 Viral pneumonia: No longer hypoxic-CRP downtrending-we will complete remdesivir 5/13.  Received 2 doses of betamethasone for OB indication.  Since not hypoxic-she does not require steroids for COVID-19 pneumonia  Fever: afebrile O2 requirements:  SpO2: 97 % O2 Flow Rate (L/min): 2 L/min  Prone/Incentive Spirometry: encouraged incentive spirometry use 3-4/hour.  COVID-19 Labs: Recent Labs    05/31/19 0510 06/01/19 0314 06/02/19 0020  CRP 5.4* 6.3*  4.5*       Component Value Date/Time   BNP 26.1 05/29/2019 1247    Recent Labs  Lab 05/29/19 1247  PROCALCITON 0.23    Lab Results  Component Value Date   SARSCOV2NAA POSITIVE (A) 05/17/2019    DKA/starvation ketoacidosis (A1c 7.3): Resolved-remains on IV insulin infusion as she is on steroids.  Will need to transition to SQ insulin once she no longer requires IV insulin.  Subclinical hyperthyroidism: Prior physician discussed with endocrinology-recommendations are for outpatient follow-up.  Thyrotropin receptor antibody negative.  SVT:  Continue telemetry monitoring-appreciate cardiology input.  Echo with preserved EF  32 weeks intrauterine pregnancy with probable preeclampsia: Deferred to the primary service  Obesity: Estimated body mass index is 36.84 kg/m as calculated from the following:   Height as of this encounter: 6' (1.829 m).   Weight as of this encounter: 123.2 kg.   ABG:    Component Value Date/Time   HCO3 13.7 (L) 05/29/2019 1746   ACIDBASEDEF 11.2 (H) 05/29/2019 1746   O2SAT 88.5 05/29/2019 1746   Vent Settings: N/A  Condition -  Guarded  Family Communication  : Patient prefers to update family herself-I have asked to let me know if family has any Covid related questions.  Code Status :  Full Code  Diet :  Diet Order            Diet gestational carb mod Room service appropriate? Yes; Fluid consistency: Thin  Diet effective now               Disposition Plan  :   Per primary service  Antimicorbials  :    Anti-infectives (From admission, onward)   Start     Dose/Rate Route Frequency Ordered Stop   05/30/19 1000  remdesivir 100 mg in sodium chloride 0.9 % 100 mL IVPB  Status:  Discontinued     100 mg 200 mL/hr over 30 Minutes Intravenous Daily 05/29/19 1345 05/29/19 1347   05/30/19 1000  remdesivir 100 mg in sodium chloride 0.9 % 100 mL IVPB     100 mg 200 mL/hr over 30 Minutes Intravenous Daily 05/29/19 1348 06/02/19 1037   05/29/19  1400  remdesivir 200 mg in sodium chloride 0.9% 250 mL IVPB     200 mg 580 mL/hr over 30 Minutes Intravenous Once 05/29/19 1348 05/29/19 1602   05/29/19 1345  remdesivir 200 mg in sodium chloride 0.9% 250 mL IVPB  Status:  Discontinued     200 mg 580 mL/hr over 30 Minutes Intravenous Once 05/29/19 1345 05/29/19 1347      Inpatient Medications  Scheduled Meds: . albuterol  2 puff Inhalation Q6H  . Chlorhexidine Gluconate Cloth  6 each Topical Daily  . clotrimazole  1 Applicatorful Vaginal QHS  . docusate sodium  100 mg Oral Daily  . enoxaparin (LOVENOX) injection  60 mg Subcutaneous Q24H  . prenatal multivitamin  1 tablet Oral Q1200  . sodium chloride flush  10-40 mL Intracatheter Q12H  . sodium chloride flush  3 mL Intravenous Q12H   Continuous Infusions: . albumin human    . dextrose 5 % and 0.45% NaCl 125 mL/hr at 06/02/19 0601  . insulin 12 Units/hr (06/02/19 1118)   PRN Meds:.[DISCONTINUED] acetaminophen **OR** acetaminophen, acetaminophen, bisacodyl, calcium carbonate, dextrose, menthol-cetylpyridinium, ondansetron **OR** ondansetron (ZOFRAN) IV, polyethylene glycol, promethazine, sodium chloride flush, triamcinolone cream, zolpidem   Time Spent in minutes  25  See all Orders from today for further details   Jeoffrey Massed M.D on 06/02/2019 at 11:34 AM  To page go to [email or web address] - use universal password  [facility] -  Office  [phone]    Objective:   Vitals:   06/01/19 2006 06/01/19 2258 06/02/19 0421 06/02/19 0757  BP: 128/89 139/88 139/84 138/90  Pulse: 98 99 (!) 115 (!) 101  Resp:  18 20 19   Temp:  97.8 F (36.6 C) 98.3 F (36.8 C) 97.9 F (36.6 C)  TempSrc:  Oral Oral Oral  SpO2:  98% 98% 97%  Weight:      Height:  Wt Readings from Last 3 Encounters:  05/29/19 123.2 kg  05/21/19 (!) 136.5 kg  05/18/19 (!) 136.5 kg     Intake/Output Summary (Last 24 hours) at 06/02/2019 1134 Last data filed at 06/02/2019 1031 Gross per 24  hour  Intake 5436.88 ml  Output 2000 ml  Net 3436.88 ml     Physical Exam Gen Exam:Alert awake-not in any distress HEENT:atraumatic, normocephalic Chest: B/L clear to auscultation anteriorly CVS:S1S2 regular Abdomen:soft non tender, non distended Extremities:no edema Neurology: Non focal Skin: no rash   Data Review:    CBC Recent Labs  Lab 05/29/19 1247 05/30/19 0704 05/31/19 0510 06/01/19 0314 06/02/19 0020  WBC 5.8 6.4 7.3 5.6 6.7  HGB 11.6* 11.5* 11.9* 11.7* 11.8*  HCT 35.5* 35.4* 36.9 35.5* 36.1  PLT 266 233 194 187 204  MCV 85.5 86.1 85.6 84.5 85.3  MCH 28.0 28.0 27.6 27.9 27.9  MCHC 32.7 32.5 32.2 33.0 32.7  RDW 13.5 13.6 13.6 13.4 13.5  LYMPHSABS 0.8 1.0 1.1 1.4 1.7  MONOABS 0.3 0.4 0.6 0.4 0.4  EOSABS 0.0 0.1 0.1 0.1 0.1  BASOSABS 0.0 0.0 0.0 0.0 0.0    Chemistries  Recent Labs  Lab 05/29/19 1247 05/29/19 1718 05/30/19 0704 05/30/19 0704 05/30/19 1954 05/30/19 1954 05/31/19 0510 05/31/19 1800 06/01/19 0314 06/01/19 0738 06/01/19 1300 06/01/19 1527 06/02/19 0020  NA 147*   < > 150*   < > 147*   < > 145   < > 146* 144 140 141 140  K 4.1   < > 3.4*   < > 3.4*   < > 3.3*   < > 3.0* 3.3* 3.3* 3.4* 3.1*  CL 123*   < > 127*   < > 122*   < > 118*   < > 115* 114* 111 108 111  CO2 11*   < > 14*   < > 15*   < > 15*   < > 23 22 19* 19* 20*  GLUCOSE 232*   < > 132*   < > 116*   < > 121*   < > 106* 141* 195* 154* 98  BUN 12   < > 9   < > 10   < > 9   < > 8 8 9 10 7   CREATININE 0.96   < > 1.03*   < > 0.87   < > 0.97   < > 0.93 0.81 1.00 0.93 0.86  CALCIUM 10.2   < > 10.5*   < > 10.2   < > 10.0   < > 9.2 9.0 9.3 9.3 9.2  MG 2.0  --   --   --  1.7  --  1.7  --   --   --   --   --   --   AST 44*  --  51*  --   --   --  48*  --  48*  --   --   --  53*  ALT 51*  --  51*  --   --   --  51*  --  50*  --   --   --  53*  ALKPHOS 100  --  99  --   --   --  105  --  101  --   --   --  105  BILITOT 1.2  --  0.9  --   --   --  0.9  --  0.6  --   --   --  0.6   < > =  values in this interval not displayed.   ------------------------------------------------------------------------------------------------------------------ No results for input(s): CHOL, HDL, LDLCALC, TRIG, CHOLHDL, LDLDIRECT in the last 72 hours.  Lab Results  Component Value Date   HGBA1C 7.3 (H) 05/29/2019   ------------------------------------------------------------------------------------------------------------------ Recent Labs    05/30/19 1954  TSH 0.123*   ------------------------------------------------------------------------------------------------------------------ No results for input(s): VITAMINB12, FOLATE, FERRITIN, TIBC, IRON, RETICCTPCT in the last 72 hours.  Coagulation profile No results for input(s): INR, PROTIME in the last 168 hours.  No results for input(s): DDIMER in the last 72 hours.  Cardiac Enzymes No results for input(s): CKMB, TROPONINI, MYOGLOBIN in the last 168 hours.  Invalid input(s): CK ------------------------------------------------------------------------------------------------------------------    Component Value Date/Time   BNP 26.1 05/29/2019 1247    Micro Results Recent Results (from the past 240 hour(s))  Urine culture     Status: Abnormal   Collection Time: 05/24/19  9:27 PM   Specimen: Urine, Random  Result Value Ref Range Status   Specimen Description URINE, RANDOM  Final   Special Requests NONE  Final   Culture (A)  Final    30,000 COLONIES/mL LACTOBACILLUS SPECIES Standardized susceptibility testing for this organism is not available. Performed at [facility], [address] [place], [place] [address]    Report Status 05/25/2019 FINAL  Final  OB Urine Culture     Status: Abnormal   Collection Time: 05/29/19 12:08 PM   Specimen: OB Clean Catch; Urine  Result Value Ref Range Status   Specimen Description OB CLEAN CATCH  Final   Special Requests NONE  Final   Culture (A)  Final    <10,000 COLONIES/mL  INSIGNIFICANT GROWTH NO GROUP B STREP (S.AGALACTIAE) ISOLATED Performed at [facility], [address] [place], [place] [address]    Report Status 05/30/2019 FINAL  Final    Radiology Reports DG Chest Port 1 View  Result Date: 06/01/2019 CLINICAL DATA:  COVID-19 positive 05/18/2019, PICC placement EXAM: PORTABLE CHEST 1 VIEW COMPARISON:  05/31/2019 at 3:53 p.m. FINDINGS: Single frontal view of the chest again demonstrates left-sided PICC extending over a left-sided mediastinum, in a configuration suggesting persistent left-sided SVC. No significant change since prior study. Cardiac silhouette is stable. Multifocal bilateral airspace disease greatest in the left mid and right lower lung zones unchanged. No effusion or pneumothorax. IMPRESSION: 1. Stable position of the left-sided PICC line, suggesting persistent left-sided superior vena cava. Please correlate with catheter function. 2. Stable multifocal pneumonia. Electronically Signed   By: Sharlet Salina M.D.   On: 06/01/2019 01:13   DG CHEST PORT 1 VIEW  Addendum Date: 05/31/2019   ADDENDUM REPORT: 05/31/2019 16:25 ADDENDUM: It should be noted left SVC typically drains into the coronary sinus and subsequently the right atrium as suggested on recent echocardiogram. Current catheter position does not reach the right atrium and is satisfactory for usage. Electronically Signed   By: Alcide Clever M.D.   On: 05/31/2019 16:25   Result Date: 05/31/2019 CLINICAL DATA:  Status post PICC line placement EXAM: PORTABLE CHEST 1 VIEW COMPARISON:  05/29/2019 FINDINGS: Cardiac shadow is again mildly prominent. New left-sided PICC line is seen with the catheter tip presenting over the mid cardiac shadow. Recent echocardiography suggested a left superior vena cava and the catheter position with support this diagnosis. The lungs are well aerated bilaterally with patchy opacities consistent with the given clinical history of COVID-19 positivity. No bony  abnormality is seen. IMPRESSION: PICC line placement with the  catheter tip projecting over the cardiac shadow to the left of the midline likely within a persistent left superior vena cava. Scattered opacities bilaterally consistent with the given clinical history. Electronically Signed: By: Alcide Clever M.D. On: 05/31/2019 16:12   DG CHEST PORT 1 VIEW  Result Date: 05/29/2019 CLINICAL DATA:  Shortness of breath and cough. COVID-19 positive. EXAM: PORTABLE CHEST 1 VIEW COMPARISON:  May 22, 2019 FINDINGS: Cardiomediastinal silhouette is mildly enlarged. Interval development of patchy bilateral airspace opacities with lower lobe predominance. Osseous structures are without acute abnormality. Soft tissues are grossly normal. IMPRESSION: 1. Interval development of patchy bilateral airspace opacities with lower lobe predominance consistent with atypical pneumonia. 2. Enlarged cardiac silhouette. Electronically Signed   By: Ted Mcalpine M.D.   On: 05/29/2019 12:13   DG Chest Port 1 View  Result Date: 05/22/2019 CLINICAL DATA:  Shortness of breath.  COVID-19. EXAM: PORTABLE CHEST 1 VIEW COMPARISON:  08/07/2018 FINDINGS: The heart size and mediastinal contours are within normal limits. Both lungs are clear. The visualized skeletal structures are unremarkable. IMPRESSION: Clear lungs. Electronically Signed   By: Deatra Robinson M.D.   On: 05/22/2019 02:41   [place] MFM FETAL BPP WO NON STRESS  Result Date: 05/31/2019 ----------------------------------------------------------------------  OBSTETRICS REPORT                       (Signed Final 05/31/2019 06:01 pm) ---------------------------------------------------------------------- Patient Info  ID #:       [id number]                          D.O.B.:  [birth year] (23 yrs)  Name:       Kelli Mcpherson              Visit Date: 05/31/2019 09:27 am ---------------------------------------------------------------------- Performed By  Attending:        Ma Rings MD          Ref. Address:     [facility]                                                             [address]                                                             [place]  [address]  Performed By:     Tommie Raymond BS,       Secondary Phy.:   [facility]                    RDMS, RVT                                                             Care  Referred By:      Candice Camp MD          [facility] ---------------------------------------------------------------------- Orders  #  Description                           Code        Ordered By  1  [place] MFM OB DETAIL +14 WK               76811.01    MEGAN MORRIS  2  [place] MFM UA CORD DOPPLER                76820.02    MEGAN MORRIS  3  [place] MFM FETAL BPP WO NON               76819.01    MEGAN MORRIS     STRESS ----------------------------------------------------------------------  #  Order #                     Accession #                Episode #  1  [id number]                   [id number]  2  [id number]                   [id number]  3  [id number]                   [id number]                 [id number] ---------------------------------------------------------------------- Indications  Gestational diabetes in pregnancy,             O24.415  controlled by oral hypoglycemic drugs, DKA  Oligohydraminios, third trimester, unspecified O41.03X0  [redacted] weeks gestation of pregnancy                Z3A.32  Obesity complicating pregnancy, third          O99.213  trimester  Unspecified maternal hypertension, third       O16.3  trimester  COVID19 (+)  (4/27)  Encounter for antenatal screening for  Z36.3  malformations  Encounter for other  antenatal screening        Z36.2  follow-up ---------------------------------------------------------------------- Fetal Evaluation  Num Of Fetuses:         1  Fetal Heart Rate(bpm):  142  Cardiac Activity:       Observed  Presentation:           Cephalic  Placenta:               Anterior  P. Cord Insertion:      Not well visualized  Amniotic Fluid  AFI FV:      Oligohydramnios  AFI Sum(cm)     %Tile       Largest Pocket(cm)  5.3             < 3         2.6  RUQ(cm)       RLQ(cm)       LUQ(cm)        LLQ(cm)  1.8           0             0.9            2.6 ---------------------------------------------------------------------- Biophysical Evaluation  Amniotic F.V:   Pocket => 2 cm             F. Tone:        Not Observed  F. Movement:    Observed                   Score:          6/8  F. Breathing:   Observed ---------------------------------------------------------------------- Biometry  BPD:      82.8  mm     G. Age:  33w 2d         76  %    CI:        83.92   %    70 - 86                                                          FL/HC:      21.8   %    19.1 - 21.3  HC:      284.9  mm     G. Age:  31w 2d        4.3  %    HC/AC:      0.98        0.96 - 1.17  AC:       292   mm     G. Age:  33w 1d         79  %    FL/BPD:     75.1   %    71 - 87  FL:       62.2  mm     G. Age:  32w 2d         39  %    FL/AC:      21.3   %    20 - 24  HUM:        57  mm     G. Age:  33w 1d         71  %  CER:  41.3  mm     G. Age:  35w 2d         87  %  LV:        4.5  mm  Est. FW:    2036  gm      4 lb 8 oz     58  % ---------------------------------------------------------------------- OB History  Gravidity:    1 ---------------------------------------------------------------------- Gestational Age  LMP:           32w 1d        Date:  10/18/18                 EDD:   07/25/19  U/S Today:     32w 4d                                        EDD:   07/22/19  Best:          32w 1d     Det. By:  LMP  (10/18/18)          EDD:   07/25/19  ---------------------------------------------------------------------- Anatomy  Cranium:               Not well visualized    LVOT:                   Appears normal  Cavum:                 Not well visualized    Aortic Arch:            Not well visualized  Ventricles:            Appears normal         Ductal Arch:            Appears normal  Choroid Plexus:        Appears normal         Diaphragm:              Appears normal  Cerebellum:            Not well visualized    Stomach:                Appears normal, left                                                                        sided  Posterior Fossa:       Not well visualized    Abdomen:                Appears normal  Nuchal Fold:           Not applicable (>20    Abdominal Wall:         Not well visualized                         wks GA)  Face:                  Orbits nl; profile not Cord Vessels:  Appears normal (3                         well visualized                                vessel cord)  Lips:                  Not well visualized    Kidneys:                Appear normal  Palate:                Not well visualized    Bladder:                Appears normal  Thoracic:              Appears normal         Spine:                  Ltd views no                                                                        intracranial signs of                                                                        NTD  Heart:                 Appears normal         Upper Extremities:      Visualized                         (4CH, axis, and                         situs)  RVOT:                  Appears normal         Lower Extremities:      Visualized  Other:  Technically difficult due to maternal habitus and fetal position.          Technically difficult due to low amniotic fluid. ---------------------------------------------------------------------- Doppler - Fetal Vessels  Umbilical Artery   S/D     %tile      RI               PI                     ADFV     RDFV   2.96       67    0.66             0.88  No      No ---------------------------------------------------------------------- Cervix Uterus Adnexa  Cervix  Not visualized (advanced GA >24wks)  Uterus  No abnormality visualized. ---------------------------------------------------------------------- Comments  This patient has been hospitalized due to diabetic  ketoacidosis and possible preeclampsia.  Her blood  pressures remain in the 120s to 150s over 80s to 90s range.  Her blood glucose levels are now in the normal range and her  diabetic ketoacidosis is resolving.  On today's exam, the overall fetal growth was appropriate for  her gestational age.  Oligohydramnios continues to be noted  today.  Doppler studies of the umbilical arteries performed today  shows a normal S/D ratio.  There were no signs of absent or  reversed end-diastolic flow.  A biophysical profile performed today was 6 out of 8.  She  received a -2 for absent fetal tone.  Her 24-hour urine results are currently pending.  Based on  her elevated blood pressures and her elevated P/C ratio, she  probably is developing preeclampsia.  Due to probable preeclampsia at her current gestational age,  now that her diabetic ketoacidosis is resolving, I would  recommend that she receive a complete course of antenatal  corticosteroids.  She should be kept on IV insulin while she is  receiving her steroid course.  She should have another ultrasound later this week to  reassess the amniotic fluid levels. ----------------------------------------------------------------------                   Ma Rings, MD Electronically Signed Final Report   05/31/2019 06:01 pm ----------------------------------------------------------------------  [place] MFM FETAL BPP WO NON STRESS  Result Date: 05/30/2019 ----------------------------------------------------------------------  OBSTETRICS REPORT                       (Signed Final 05/30/2019 05:11 pm)  ---------------------------------------------------------------------- Patient Info  ID #:       [id number]                          D.O.B.:  08-11-1995 (23 yrs)  Name:       Kelli Mcpherson              Visit Date: 05/30/2019 09:39 am ---------------------------------------------------------------------- Performed By  Attending:        Ma Rings MD         Ref. Address:     [facility]                                                             [address]                                                             [place]                                                             [phone]  Performed By:     Tommie Raymond BS,       Secondary Phy.:   [facility]                    RDMS, RVT                                                             Care  Referred By:      Candice Camp MD          [facility] ---------------------------------------------------------------------- Orders  #  Description                           Code        Ordered By  1  [place] MFM FETAL BPP WO NON               19147.82    MEGAN MORRIS     STRESS ----------------------------------------------------------------------  #  Order #                     Accession #                Episode #  1  [id number]                   [id number]                 [id number] ---------------------------------------------------------------------- Indications  Oligohydraminios, third trimester, unspecified O41.03X0  Gestational diabetes in pregnancy,             O24.415  controlled by oral hypoglycemic drugs, DKA  Obesity complicating pregnancy, third          O99.213  trimester  Unspecified maternal hypertension, third       O16.3  trimester  COVID19 (+)  (4/27)  [redacted] weeks gestation of pregnancy                Z3A.31  ---------------------------------------------------------------------- Fetal Evaluation  Num Of Fetuses:         1  Fetal Heart Rate(bpm):  166  Cardiac Activity:  Observed  Presentation:           Cephalic  Placenta:               Anterior  P. Cord Insertion:      Not well visualized  Amniotic Fluid  AFI FV:      Oligohydramnios  AFI Sum(cm)     %Tile       Largest Pocket(cm)  2.2             < 3         1.1  RUQ(cm)       RLQ(cm)       LUQ(cm)        LLQ(cm)  1.1           0             1.1            0 ---------------------------------------------------------------------- Biophysical Evaluation  Amniotic F.V:   Oligohydramnios            F. Tone:        Observed  F. Movement:    Not Observed               Score:          4/8  F. Breathing:   Observed ---------------------------------------------------------------------- OB History  Gravidity:    1 ---------------------------------------------------------------------- Gestational Age  LMP:           32w 0d        Date:  10/18/18                 EDD:   07/25/19  Best:          Kelli Mcpherson 0d     Det. By:  LMP  (10/18/18)          EDD:   07/25/19 ---------------------------------------------------------------------- Anatomy  Diaphragm:             Appears normal         Kidneys:                Appear normal  Stomach:               Appears normal, left   Bladder:                Appears normal                         sided  Other:  Technically difficult due to low amniotic fluid. Technically difficult due          to maternal habitus and fetal position. ---------------------------------------------------------------------- Cervix Uterus Adnexa  Cervix  Not visualized (advanced GA >24wks)  Uterus  No abnormality visualized.  Right Ovary  Within normal limits. No adnexal mass visualized.  Left Ovary  Within normal limits. No adnexal mass visualized.  Cul De Sac  No free fluid seen.  Adnexa  No abnormality visualized.  ---------------------------------------------------------------------- Comments  This patient has been hospitalized due to diabetic  ketoacidosis and persistent nausea and vomiting.  She also  tested positive for COVID-19 about 2 weeks ago.  She is  currently being treated with remdesivir for Covid pneumonia.  She has been receiving IV hydration and IV insulin to help  resolve the diabetic ketoacidosis.  This morning, her fetal heart rate tracing has improved.  There are periods of variability and accelerations.  No further  decelerations are noted.  The patient is also  more alert and  awake.  Her labs show hypernatremia with a sodium level of 150,  serum creatinine of 1.03, and AST and ALT that are mildly  elevated (51).  Her fingerstick values are now in the 110s to  120s range.  Her blood pressures remain in the 140s over  80s range.  A biophysical profile performed today was 4 out of 8.  The  patient received -2 for absent fetal movements and also -2 for  anhydramnios.  I reviewed the patient's ultrasound images from yesterday.  A  >2 cm pocket of amniotic fluid was noted yesterday.  Due to her symptoms of persistent nausea and vomiting, her  elevated blood pressures, her elevated liver function tests,  and her elevated serum creatinine level, a diagnosis of  preeclampsia has not been ruled out.  A P/C ratio performed this afternoon was 0.86 (elevated).  A  24-hour urine collection is underway to assess the total  protein.  A course of antenatal corticosteroids for fetal lung maturity  has been held due to concerns that it may elevate her blood  glucose levels and further worsen her diabetic ketoacidosis.  The patient should be kept on continuous monitoring  overnight.  I would recommend delivery should the fetal  status be nonreassuring.  Should her diabetic ketoacidosis continue to resolve and her  blood pressures remain elevated therefore increasing the  suspicion for preeclampsia, a course of antenatal   corticosteroids should be administered tomorrow morning.  The patient will continue to receive IV hydration overnight.  We will repeat another ultrasound tomorrow to assess the  amniotic fluid levels, the fetal growth, and umbilical artery  Doppler studies. ----------------------------------------------------------------------                   Ma Rings, MD Electronically Signed Final Report   05/30/2019 05:11 pm ----------------------------------------------------------------------  [place] MFM FETAL BPP WO NON STRESS  Result Date: 05/29/2019 ----------------------------------------------------------------------  OBSTETRICS REPORT                       (Signed Final 05/29/2019 12:15 pm) ---------------------------------------------------------------------- Patient Info  ID #:       [id number]                          D.O.B.:  1995-10-09 (23 yrs)  Name:       Kelli Mcpherson              Visit Date: 05/29/2019 09:58 am ---------------------------------------------------------------------- Performed By  Attending:        Lin Landsman      Ref. Address:      [facility]  [address]                                                              [place]                                                              [address]  Performed By:     Birdena Crandall        [facility]  Referred By:      Candice Camp MD ---------------------------------------------------------------------- Orders  #  Description                           Code        Ordered By  1  [place] MFM FETAL BPP WO NON               60454.09    DAVID LOWE     STRESS ----------------------------------------------------------------------  #   Order #                     Accession #                Episode #  1  [id number]                   [id number]                 [id number] ---------------------------------------------------------------------- Indications  Gestational diabetes in pregnancy,              O24.415  controlled by oral hypoglycemic drugs, DKA  Obesity complicating pregnancy, third           O99.213  trimester  Unspecified maternal hypertension, third        O16.3  trimester  COVID19 (+)  (4/27)  Oligohydraminios, third trimester, unspecified  O41.03X0  [redacted] weeks gestation of pregnancy                 Z3A.31 ---------------------------------------------------------------------- Fetal Evaluation  Num Of Fetuses:          1  Fetal Heart Rate(bpm):   150  Cardiac Activity:        Observed  Presentation:            Cephalic  Placenta:  Anterior  Amniotic Fluid  AFI FV:      Within normal limits  AFI Sum(cm)     %Tile       Largest Pocket(cm)  9.5             12          2.8  RUQ(cm)       RLQ(cm)       LUQ(cm)        LLQ(cm)  2.8           2.5           2.8            1.4 ---------------------------------------------------------------------- Biophysical Evaluation  Amniotic F.V:   Within normal limits       F. Tone:         Observed  F. Movement:    Not Observed               Score:           4/8  F. Breathing:   Not Observed ---------------------------------------------------------------------- OB History  Gravidity:    1 ---------------------------------------------------------------------- Gestational Age  LMP:           31w 6d        Date:  10/18/18                 EDD:   07/25/19  Best:          31w 6d     Det. By:  LMP  (10/18/18)          EDD:   07/25/19 ---------------------------------------------------------------------- Anatomy  Stomach:               Appears normal, left   Kidneys:                Appear normal                         sided  Abdominal Wall:        Appears nml (cord      Bladder:                Appears  normal                         insert, abd wall) ---------------------------------------------------------------------- Impression  Limited exam due to presumed maternal DKA  Biophysical profile 4 out of 8 there is some movement but not  enough to pass the BPP.  Labs are pending with continued fetal monitoring.  I discussed with Dr. Rana Snare the current plan and agree. ---------------------------------------------------------------------- Recommendations  Continueed Fetal monitoring  Hold Betamethsone  DKA protocol including insulin drip and fluid hydration with  the goal of blood sugars 100-140's.  Will await labs to perform consultation and diagnosis of DKA.  Continue COVID protocol. ----------------------------------------------------------------------               Lin Landsman, MD Electronically Signed Final Report   05/29/2019 12:15 pm ----------------------------------------------------------------------  [place] MFM OB DETAIL +14 WK  Result Date: 05/31/2019 ----------------------------------------------------------------------  OBSTETRICS REPORT                       (Signed Final 05/31/2019 06:01 pm) ---------------------------------------------------------------------- Patient Info  ID #:       [id number]  D.O.B.:  [birth year] (23 yrs)  Name:       DENNA FRYBERGER              Visit Date: 05/31/2019 09:27 am ---------------------------------------------------------------------- Performed By  Attending:        Ma Rings MD         Ref. Address:     [facility]                                                             [address]                                                             [place]                                                             [phone]  Performed By:     Tommie Raymond BS,       Secondary Phy.:   [facility]                     [facility]                                                             [facility]  Referred By:      Candice Camp MD          [facility] ---------------------------------------------------------------------- Orders  #  Description  Code        Ordered By  1  [place] MFM OB DETAIL +14 [place]               [id number]    MEGAN MORRIS  2  [place] MFM UA CORD DOPPLER                76820.02    MEGAN MORRIS  3  [place] MFM FETAL BPP WO NON               76819.01    MEGAN MORRIS     STRESS ----------------------------------------------------------------------  #  Order #                     Accession #                Episode #  1  [id number]                   [id number]                 [id number]  2  [id number]                   [id number]                 [id number]  3  [id number]                   [id number]                 [id number] ---------------------------------------------------------------------- Indications  Gestational diabetes in pregnancy,             O24.415  controlled by oral hypoglycemic drugs, DKA  Oligohydraminios, third trimester, unspecified O41.03X0  [redacted] weeks gestation of pregnancy                I3J.82  Obesity complicating pregnancy, third          O99.213  trimester  Unspecified maternal hypertension, third       O16.3  trimester  COVID19 (+)  (4/27)  Encounter for antenatal screening for          Z36.3  malformations  Encounter for other antenatal screening        Z36.2  follow-up ---------------------------------------------------------------------- Fetal Evaluation  Num Of Fetuses:         1  Fetal Heart Rate(bpm):  142  Cardiac Activity:       Observed  Presentation:           Cephalic  Placenta:               Anterior  P. Cord Insertion:      Not well visualized  Amniotic Fluid  AFI FV:      Oligohydramnios  AFI Sum(cm)     %Tile       Largest Pocket(cm)  5.3             < 3         2.6   RUQ(cm)       RLQ(cm)       LUQ(cm)        LLQ(cm)  1.8           0             0.9            2.6 ---------------------------------------------------------------------- Biophysical Evaluation  Amniotic F.V:   Pocket => 2 cm  F. Tone:        Not Observed  F. Movement:    Observed                   Score:          6/8  F. Breathing:   Observed ---------------------------------------------------------------------- Biometry  BPD:      82.8  mm     G. Age:  33w 2d         76  %    CI:        83.92   %    70 - 86                                                          FL/HC:      21.8   %    19.1 - 21.3  HC:      284.9  mm     G. Age:  31w 2d        4.3  %    HC/AC:      0.98        0.96 - 1.17  AC:       292   mm     G. Age:  33w 1d         79  %    FL/BPD:     75.1   %    71 - 87  FL:       62.2  mm     G. Age:  32w 2d         39  %    FL/AC:      21.3   %    20 - 24  HUM:        57  mm     G. Age:  33w 1d         71  %  CER:      41.3  mm     G. Age:  35w 2d         87  %  LV:        4.5  mm  Est. FW:    2036  gm      4 lb 8 oz     58  % ---------------------------------------------------------------------- OB History  Gravidity:    1 ---------------------------------------------------------------------- Gestational Age  LMP:           32w 1d        Date:  10/18/18                 EDD:   07/25/19  U/S Today:     32w 4d                                        EDD:   07/22/19  Best:          32w 1d     Det. By:  LMP  (10/18/18)          EDD:   07/25/19 ---------------------------------------------------------------------- Anatomy  Cranium:               Not well visualized    LVOT:  Appears normal  Cavum:                 Not well visualized    Aortic Arch:            Not well visualized  Ventricles:            Appears normal         Ductal Arch:            Appears normal  Choroid Plexus:        Appears normal         Diaphragm:              Appears normal  Cerebellum:            Not well  visualized    Stomach:                Appears normal, left                                                                        sided  Posterior Fossa:       Not well visualized    Abdomen:                Appears normal  Nuchal Fold:           Not applicable (>20    Abdominal Wall:         Not well visualized                         wks GA)  Face:                  Orbits nl; profile not Cord Vessels:           Appears normal (3                         well visualized                                vessel cord)  Lips:                  Not well visualized    Kidneys:                Appear normal  Palate:                Not well visualized    Bladder:                Appears normal  Thoracic:              Appears normal         Spine:                  Ltd views no  intracranial signs of                                                                        NTD  Heart:                 Appears normal         Upper Extremities:      Visualized                         (4CH, axis, and                         situs)  RVOT:                  Appears normal         Lower Extremities:      Visualized  Other:  Technically difficult due to maternal habitus and fetal position.          Technically difficult due to low amniotic fluid. ---------------------------------------------------------------------- Doppler - Fetal Vessels  Umbilical Artery   S/D     %tile      RI               PI                     ADFV    RDFV   2.96       67    0.66             0.88                        No      No ---------------------------------------------------------------------- Cervix Uterus Adnexa  Cervix  Not visualized (advanced GA >24wks)  Uterus  No abnormality visualized. ---------------------------------------------------------------------- Comments  This patient has been hospitalized due to diabetic  ketoacidosis and possible preeclampsia.  Her blood  pressures remain in  the 120s to 150s over 80s to 90s range.  Her blood glucose levels are now in the normal range and her  diabetic ketoacidosis is resolving.  On today's exam, the overall fetal growth was appropriate for  her gestational age.  Oligohydramnios continues to be noted  today.  Doppler studies of the umbilical arteries performed today  shows a normal S/D ratio.  There were no signs of absent or  reversed end-diastolic flow.  A biophysical profile performed today was 6 out of 8.  She  received a -2 for absent fetal tone.  Her 24-hour urine results are currently pending.  Based on  her elevated blood pressures and her elevated P/C ratio, she  probably is developing preeclampsia.  Due to probable preeclampsia at her current gestational age,  now that her diabetic ketoacidosis is resolving, I would  recommend that she receive a complete course of antenatal  corticosteroids.  She should be kept on IV insulin while she is  receiving her steroid course.  She should have another ultrasound later this week to  reassess the amniotic fluid levels. ----------------------------------------------------------------------                   Ma Rings, MD  Electronically Signed Final Report   05/31/2019 06:01 pm ----------------------------------------------------------------------  ECHOCARDIOGRAM COMPLETE  Result Date: 05/31/2019    ECHOCARDIOGRAM REPORT   Patient Name:   TAYLAH DUBIEL Date of Exam: 05/31/2019 Medical Rec #:  [id number]          Height:       72.0 in Accession #:    [id number]         Weight:       271.6 lb Date of Birth:  1995-05-23          BSA:          2.427 m Patient Age:    23 years           BP:           133/68 mmHg Patient Gender: F                  HR:           109 bpm. Exam Location:  Inpatient Procedure: 2D Echo, Color Doppler and Cardiac Doppler Indications:    I47.1 SVT  History:        Patient has no prior history of Echocardiogram examinations.                 COVID+ 05/17/19                 [redacted]  Weeks Pregnant at time of study.  Sonographer:    Irving Burton Senior RDCS Referring Phys: [id number] DAWOOD S Randol Kern  Sonographer Comments: Technically difficult due to patient body habitus. IMPRESSIONS  1. Left ventricular ejection fraction, by estimation, is 50 to 55%. The left ventricle has low normal function. The left ventricle demonstrates global hypokinesis. Indeterminate diastolic filling due to E-A fusion. There is abnormal (paradoxical) septal  motion, consistent with right ventricular volume overload.  2. Right ventricular systolic function is normal. The right ventricular size is normal. Tricuspid regurgitation signal is inadequate for assessing PA pressure.  3. 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 The inferior vena cava is also dilated, but the right heart chambers are not enlarged.  4. The mitral valve is normal in structure. No evidence of mitral valve regurgitation. No evidence of mitral stenosis.  5. The aortic valve is normal in structure. Aortic valve regurgitation is not visualized. No aortic stenosis is present.  6. The inferior vena cava is dilated in size with <50% respiratory variability, suggesting right atrial pressure of 15 mmHg. Comparison(s): No prior Echocardiogram. Conclusion(s)/Recommendation(s): Recommend a saline contrast injection from a vein in the left upper extremity, with imaging of the coronary sinus. FINDINGS  Left Ventricle: Left ventricular ejection fraction, by estimation, is 50 to 55%. The left ventricle has low normal function. The left ventricle demonstrates global hypokinesis. The left ventricular internal cavity size was normal in size. There is no left ventricular hypertrophy. Abnormal (paradoxical) septal motion, consistent with right ventricular volume overload. Indeterminate diastolic filling due to E-A fusion. Right Ventricle: The right ventricular size is normal. No increase in right ventricular wall thickness. Right  ventricular systolic function is normal. Tricuspid regurgitation signal is inadequate for assessing PA pressure. Left Atrium: Left atrial size was normal in size. 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 The inferior vena cava is also dilated, but the right heart chambers are not enlarged. Right atrial size was normal in size. Pericardium: There is no evidence of pericardial effusion. Mitral Valve:  The mitral valve is normal in structure. Normal mobility of the mitral valve leaflets. No evidence of mitral valve regurgitation. No evidence of mitral valve stenosis. Tricuspid Valve: The tricuspid valve is normal in structure. Tricuspid valve regurgitation is not demonstrated. No evidence of tricuspid stenosis. Aortic Valve: The aortic valve is normal in structure. Aortic valve regurgitation is not visualized. No aortic stenosis is present. Pulmonic Valve: The pulmonic valve was normal in structure. Pulmonic valve regurgitation is not visualized. No evidence of pulmonic stenosis. Aorta: The aortic root is normal in size and structure. Venous: The inferior vena cava is dilated in size with less than 50% respiratory variability, suggesting right atrial pressure of 15 mmHg. IAS/Shunts: No atrial level shunt detected by color flow Doppler.  LEFT VENTRICLE PLAX 2D LVIDd:         5.00 cm LVIDs:         3.80 cm LV PW:         1.10 cm LV IVS:        1.40 cm LVOT diam:     2.00 cm LV SV:         56 LV SV Index:   23 LVOT Area:     3.14 cm  RIGHT VENTRICLE RV S prime:     12.20 cm/s TAPSE (M-mode): 1.8 cm LEFT ATRIUM           Index       RIGHT ATRIUM           Index LA diam:      3.20 cm 1.32 cm/m  RA Area:     17.60 cm LA Vol (A2C): 45.3 ml 18.67 ml/m RA Volume:   51.10 ml  21.06 ml/m LA Vol (A4C): 47.7 ml 19.66 ml/m  AORTIC VALVE LVOT Vmax:   104.00 cm/s LVOT Vmean:  79.700 cm/s LVOT VTI:    0.179 m  AORTA Ao Root diam: 3.30 cm Ao Asc diam:  2.80 cm  SHUNTS  Systemic VTI:  0.18 m Systemic Diam: 2.00 cm Thurmon Fair MD Electronically signed by Thurmon Fair MD Signature Date/Time: 05/31/2019/9:09:24 AM    Final    [place] EKG SITE RITE  Result Date: 05/31/2019 If Site Rite image not attached, placement could not be confirmed due to current cardiac rhythm.  [place] MFM UA CORD DOPPLER  Result Date: 05/31/2019 ----------------------------------------------------------------------  OBSTETRICS REPORT                       (Signed Final 05/31/2019 06:01 pm) ---------------------------------------------------------------------- Patient Info  ID #:       [id number]                          D.O.B.:  [birth year] (23 yrs)  Name:       Kelli Mcpherson              Visit Date: 05/31/2019 09:27 am ---------------------------------------------------------------------- Performed By  Attending:        Ma Rings MD         Ref. Address:     [facility]  [address]                                                             [place]                                                             [address]  Performed By:     Tommie Raymond BS,       Secondary Phy.:   [facility]                    [facility], [facility]                                                             [facility]  Referred By:      Candice Camp MD          [facility] ---------------------------------------------------------------------- Orders  #  Description                           Code        Ordered By  1  [place] MFM OB DETAIL +14 WK               76811.01    MEGAN MORRIS  2  [place] MFM UA CORD DOPPLER                76820.02    MEGAN MORRIS  3  [place] MFM FETAL BPP WO NON               60454.09    MEGAN MORRIS     STRESS  ----------------------------------------------------------------------  #  Order #                     Accession #                Episode #  1  [id number]                   [id number]  [id number]  2  [id number]                   [id number]  3  [id number]                   [id number] ---------------------------------------------------------------------- Indications  Gestational diabetes in pregnancy,             O24.415  controlled by oral hypoglycemic drugs, DKA  Oligohydraminios, third trimester, unspecified O41.03X0  [redacted] weeks gestation of pregnancy                Z3A.32  Obesity complicating pregnancy, third          O99.213  trimester  Unspecified maternal hypertension, third       O16.3  trimester  COVID19 (+)  (4/27)  Encounter for antenatal screening for          Z36.3  malformations  Encounter for other antenatal screening        Z36.2  follow-up ---------------------------------------------------------------------- Fetal Evaluation  Num Of Fetuses:         1  Fetal Heart Rate(bpm):  142  Cardiac Activity:       Observed  Presentation:           Cephalic  Placenta:               Anterior  P. Cord Insertion:      Not well visualized  Amniotic Fluid  AFI FV:      Oligohydramnios  AFI Sum(cm)     %Tile       Largest Pocket(cm)  5.3             < 3         2.6  RUQ(cm)       RLQ(cm)       LUQ(cm)        LLQ(cm)  1.8           0             0.9            2.6 ---------------------------------------------------------------------- Biophysical Evaluation  Amniotic F.V:   Pocket => 2 cm             F. Tone:        Not Observed  F. Movement:    Observed                   Score:          6/8  F. Breathing:   Observed ---------------------------------------------------------------------- Biometry  BPD:      82.8  mm     G. Age:  33w 2d         76  %    CI:        83.92   %    70 - 86                                                          FL/HC:      21.8    %    19.1 - 21.3  HC:      284.9  mm     G. Age:  31w 2d        4.3  %    HC/AC:      0.98        0.96 - 1.17  AC:       292   mm     G. Age:  33w 1d         79  %    FL/BPD:     75.1   %    71 - 87  FL:       62.2  mm     G. Age:  32w 2d         39  %    FL/AC:      21.3   %    20 - 24  HUM:        57  mm     G. Age:  33w 1d         71  %  CER:      41.3  mm     G. Age:  35w 2d         87  %  LV:        4.5  mm  Est. FW:    2036  gm      4 lb 8 oz     58  % ---------------------------------------------------------------------- OB History  Gravidity:    1 ---------------------------------------------------------------------- Gestational Age  LMP:           32w 1d        Date:  10/18/18                 EDD:   07/25/19  U/S Today:     32w 4d                                        EDD:   07/22/19  Best:          32w 1d     Det. By:  LMP  (10/18/18)          EDD:   07/25/19 ---------------------------------------------------------------------- Anatomy  Cranium:               Not well visualized    LVOT:                   Appears normal  Cavum:                 Not well visualized    Aortic Arch:            Not well visualized  Ventricles:            Appears normal         Ductal Arch:            Appears normal  Choroid Plexus:        Appears normal         Diaphragm:              Appears normal  Cerebellum:            Not well visualized    Stomach:                Appears normal, left  sided  Posterior Fossa:       Not well visualized    Abdomen:                Appears normal  Nuchal Fold:           Not applicable (>20    Abdominal Wall:         Not well visualized                         wks GA)  Face:                  Orbits nl; profile not Cord Vessels:           Appears normal (3                         well visualized                                vessel cord)  Lips:                  Not well visualized    Kidneys:                Appear normal   Palate:                Not well visualized    Bladder:                Appears normal  Thoracic:              Appears normal         Spine:                  Ltd views no                                                                        intracranial signs of                                                                        NTD  Heart:                 Appears normal         Upper Extremities:      Visualized                         (4CH, axis, and                         situs)  RVOT:                  Appears normal         Lower Extremities:      Visualized  Other:  Technically difficult due to maternal habitus  and fetal position.          Technically difficult due to low amniotic fluid. ---------------------------------------------------------------------- Doppler - Fetal Vessels  Umbilical Artery   S/D     %tile      RI               PI                     ADFV    RDFV   2.96       67    0.66             0.88                        No      No ---------------------------------------------------------------------- Cervix Uterus Adnexa  Cervix  Not visualized (advanced GA >24wks)  Uterus  No abnormality visualized. ---------------------------------------------------------------------- Comments  This patient has been hospitalized due to diabetic  ketoacidosis and possible preeclampsia.  Her blood  pressures remain in the 120s to 150s over 80s to 90s range.  Her blood glucose levels are now in the normal range and her  diabetic ketoacidosis is resolving.  On today's exam, the overall fetal growth was appropriate for  her gestational age.  Oligohydramnios continues to be noted  today.  Doppler studies of the umbilical arteries performed today  shows a normal S/D ratio.  There were no signs of absent or  reversed end-diastolic flow.  A biophysical profile performed today was 6 out of 8.  She  received a -2 for absent fetal tone.  Her 24-hour urine results are currently pending.  Based on  her elevated blood  pressures and her elevated P/C ratio, she  probably is developing preeclampsia.  Due to probable preeclampsia at her current gestational age,  now that her diabetic ketoacidosis is resolving, I would  recommend that she receive a complete course of antenatal  corticosteroids.  She should be kept on IV insulin while she is  receiving her steroid course.  She should have another ultrasound later this week to  reassess the amniotic fluid levels. ----------------------------------------------------------------------                   Ma Rings, MD Electronically Signed Final Report   05/31/2019 06:01 pm ----------------------------------------------------------------------

## 2019-06-03 ENCOUNTER — Inpatient Hospital Stay (HOSPITAL_BASED_OUTPATIENT_CLINIC_OR_DEPARTMENT_OTHER): Payer: Managed Care, Other (non HMO)

## 2019-06-03 DIAGNOSIS — O4103X0 Oligohydramnios, third trimester, not applicable or unspecified: Secondary | ICD-10-CM

## 2019-06-03 DIAGNOSIS — O99213 Obesity complicating pregnancy, third trimester: Secondary | ICD-10-CM

## 2019-06-03 DIAGNOSIS — O24415 Gestational diabetes mellitus in pregnancy, controlled by oral hypoglycemic drugs: Secondary | ICD-10-CM | POA: Diagnosis not present

## 2019-06-03 DIAGNOSIS — E669 Obesity, unspecified: Secondary | ICD-10-CM | POA: Diagnosis not present

## 2019-06-03 DIAGNOSIS — O163 Unspecified maternal hypertension, third trimester: Secondary | ICD-10-CM

## 2019-06-03 DIAGNOSIS — Z362 Encounter for other antenatal screening follow-up: Secondary | ICD-10-CM

## 2019-06-03 DIAGNOSIS — Z3A32 32 weeks gestation of pregnancy: Secondary | ICD-10-CM | POA: Diagnosis not present

## 2019-06-03 LAB — BASIC METABOLIC PANEL
Anion gap: 6 (ref 5–15)
Anion gap: 9 (ref 5–15)
BUN: <5 mg/dL — ABNORMAL LOW (ref 6–20)
BUN: <5 mg/dL — ABNORMAL LOW (ref 6–20)
CO2: 20 mmol/L — ABNORMAL LOW (ref 22–32)
CO2: 17 mmol/L — ABNORMAL LOW (ref 22–32)
Calcium: 7.5 mg/dL — ABNORMAL LOW (ref 8.9–10.3)
Calcium: 8.7 mg/dL — ABNORMAL LOW (ref 8.9–10.3)
Chloride: 107 mmol/L (ref 98–111)
Chloride: 109 mmol/L (ref 98–111)
Creatinine, Ser: 0.83 mg/dL (ref 0.44–1.00)
Creatinine, Ser: 0.82 mg/dL (ref 0.44–1.00)
GFR calc Af Amer: >60 mL/min (ref >60)
GFR calc Af Amer: >60 mL/min (ref >60)
GFR calc non Af Amer: >60 mL/min (ref >60)
GFR calc non Af Amer: >60 mL/min (ref >60)
Glucose, Bld: 565 mg/dL (ref 70–99)
Glucose, Bld: 105 mg/dL — ABNORMAL HIGH (ref 70–99)
Potassium: 2.6 mmol/L — CL (ref 3.5–5.1)
Potassium: 3.3 mmol/L — ABNORMAL LOW (ref 3.5–5.1)
Sodium: 133 mmol/L — ABNORMAL LOW (ref 135–145)
Sodium: 135 mmol/L (ref 135–145)

## 2019-06-03 LAB — C-REACTIVE PROTEIN: CRP: 1.1 mg/dL — ABNORMAL HIGH (ref <1.0)

## 2019-06-03 LAB — GLUCOSE, CAPILLARY
Glucose-Capillary: 104 mg/dL — ABNORMAL HIGH (ref 70–99)
Glucose-Capillary: 107 mg/dL — ABNORMAL HIGH (ref 70–99)
Glucose-Capillary: 107 mg/dL — ABNORMAL HIGH (ref 70–99)
Glucose-Capillary: 109 mg/dL — ABNORMAL HIGH (ref 70–99)
Glucose-Capillary: 110 mg/dL — ABNORMAL HIGH (ref 70–99)
Glucose-Capillary: 115 mg/dL — ABNORMAL HIGH (ref 70–99)
Glucose-Capillary: 115 mg/dL — ABNORMAL HIGH (ref 70–99)
Glucose-Capillary: 121 mg/dL — ABNORMAL HIGH (ref 70–99)
Glucose-Capillary: 160 mg/dL — ABNORMAL HIGH (ref 70–99)
Glucose-Capillary: 80 mg/dL (ref 70–99)
Glucose-Capillary: 83 mg/dL (ref 70–99)
Glucose-Capillary: 85 mg/dL (ref 70–99)
Glucose-Capillary: 86 mg/dL (ref 70–99)
Glucose-Capillary: 86 mg/dL (ref 70–99)
Glucose-Capillary: 87 mg/dL (ref 70–99)
Glucose-Capillary: 87 mg/dL (ref 70–99)
Glucose-Capillary: 87 mg/dL (ref 70–99)
Glucose-Capillary: 88 mg/dL (ref 70–99)
Glucose-Capillary: 92 mg/dL (ref 70–99)
Glucose-Capillary: 93 mg/dL (ref 70–99)
Glucose-Capillary: 95 mg/dL (ref 70–99)
Glucose-Capillary: 96 mg/dL (ref 70–99)
Glucose-Capillary: 99 mg/dL (ref 70–99)

## 2019-06-03 LAB — CBC WITH DIFFERENTIAL/PLATELET
Abs Immature Granulocytes: 0.25 10*3/uL — ABNORMAL HIGH (ref 0.00–0.07)
Basophils Absolute: 0 10*3/uL (ref 0.0–0.1)
Basophils Relative: 0 %
Eosinophils Absolute: 0 10*3/uL (ref 0.0–0.5)
Eosinophils Relative: 0 %
HCT: 30.7 % — ABNORMAL LOW (ref 36.0–46.0)
Hemoglobin: 9.7 g/dL — ABNORMAL LOW (ref 12.0–15.0)
Immature Granulocytes: 4 %
Lymphocytes Relative: 27 %
Lymphs Abs: 1.7 10*3/uL (ref 0.7–4.0)
MCH: 27.3 pg (ref 26.0–34.0)
MCHC: 31.6 g/dL (ref 30.0–36.0)
MCV: 86.5 fL (ref 80.0–100.0)
Monocytes Absolute: 0.7 10*3/uL (ref 0.1–1.0)
Monocytes Relative: 11 %
Neutro Abs: 3.5 10*3/uL (ref 1.7–7.7)
Neutrophils Relative %: 58 %
Platelets: 173 10*3/uL (ref 150–400)
RBC: 3.55 MIL/uL — ABNORMAL LOW (ref 3.87–5.11)
RDW: 13.3 % (ref 11.5–15.5)
WBC: 6.1 10*3/uL (ref 4.0–10.5)
nRBC: 1.2 % — ABNORMAL HIGH (ref 0.0–0.2)

## 2019-06-03 LAB — COMPREHENSIVE METABOLIC PANEL
ALT: 50 U/L — ABNORMAL HIGH (ref 0–44)
ALT: 55 U/L — ABNORMAL HIGH (ref 0–44)
AST: 55 U/L — ABNORMAL HIGH (ref 15–41)
AST: 57 U/L — ABNORMAL HIGH (ref 15–41)
Albumin: 1.9 g/dL — ABNORMAL LOW (ref 3.5–5.0)
Albumin: 2 g/dL — ABNORMAL LOW (ref 3.5–5.0)
Alkaline Phosphatase: 88 U/L (ref 38–126)
Alkaline Phosphatase: 91 U/L (ref 38–126)
Anion gap: 8 (ref 5–15)
Anion gap: 7 (ref 5–15)
BUN: 5 mg/dL — ABNORMAL LOW (ref 6–20)
BUN: <5 mg/dL — ABNORMAL LOW (ref 6–20)
CO2: 20 mmol/L — ABNORMAL LOW (ref 22–32)
CO2: 19 mmol/L — ABNORMAL LOW (ref 22–32)
Calcium: 8.6 mg/dL — ABNORMAL LOW (ref 8.9–10.3)
Calcium: 8.6 mg/dL — ABNORMAL LOW (ref 8.9–10.3)
Chloride: 111 mmol/L (ref 98–111)
Chloride: 111 mmol/L (ref 98–111)
Creatinine, Ser: 0.84 mg/dL (ref 0.44–1.00)
Creatinine, Ser: 0.85 mg/dL (ref 0.44–1.00)
GFR calc Af Amer: >60 mL/min (ref >60)
GFR calc Af Amer: >60 mL/min (ref >60)
GFR calc non Af Amer: >60 mL/min (ref >60)
GFR calc non Af Amer: >60 mL/min (ref >60)
Glucose, Bld: 89 mg/dL (ref 70–99)
Glucose, Bld: 91 mg/dL (ref 70–99)
Potassium: 2.7 mmol/L — CL (ref 3.5–5.1)
Potassium: 3.2 mmol/L — ABNORMAL LOW (ref 3.5–5.1)
Sodium: 139 mmol/L (ref 135–145)
Sodium: 137 mmol/L (ref 135–145)
Total Bilirubin: 0.8 mg/dL (ref 0.3–1.2)
Total Bilirubin: 0.5 mg/dL (ref 0.3–1.2)
Total Protein: 5.2 g/dL — ABNORMAL LOW (ref 6.5–8.1)
Total Protein: 5.3 g/dL — ABNORMAL LOW (ref 6.5–8.1)

## 2019-06-03 LAB — MAGNESIUM: Magnesium: 1.6 mg/dL — ABNORMAL LOW (ref 1.7–2.4)

## 2019-06-03 MED ORDER — POTASSIUM CHLORIDE 10 MEQ/100ML IV SOLN
10.0000 meq | Freq: Once | INTRAVENOUS | Status: AC
  Administered 2019-06-03: 10 meq via INTRAVENOUS
  Filled 2019-06-03: qty 100

## 2019-06-03 MED ORDER — GENERIC EXTERNAL MEDICATION
Status: DC
Start: ? — End: 2019-06-03

## 2019-06-03 MED ORDER — POTASSIUM CHLORIDE CRYS ER 20 MEQ PO TBCR: 40.0000 meq | EXTENDED_RELEASE_TABLET | ORAL | Status: DC

## 2019-06-03 MED ORDER — KCL IN DEXTROSE-NACL 40-5-0.45 MEQ/L-%-% IV SOLN
INTRAVENOUS | Status: DC
  Filled 2019-06-03 (×4): qty 1000

## 2019-06-03 MED ORDER — POTASSIUM CHLORIDE 20 MEQ PO PACK
40.0000 meq | PACK | ORAL | Status: AC
  Administered 2019-06-03 (×2): 40 meq via ORAL
  Filled 2019-06-03 (×2): qty 2

## 2019-06-03 MED ORDER — POTASSIUM CHLORIDE CRYS ER 20 MEQ PO TBCR
40.0000 meq | EXTENDED_RELEASE_TABLET | Freq: Once | ORAL | Status: AC
  Administered 2019-06-03: 40 meq via ORAL
  Filled 2019-06-03: qty 2

## 2019-06-03 MED ORDER — POTASSIUM CHLORIDE 10 MEQ/100ML IV SOLN: 10.0000 meq | INTRAVENOUS | Status: DC

## 2019-06-03 MED ORDER — POTASSIUM CHLORIDE 20 MEQ PO PACK: 40.0000 meq | PACK | ORAL | Status: DC

## 2019-06-03 MED ORDER — POTASSIUM CHLORIDE 10 MEQ/100ML IV SOLN: 10.0000 meq | Freq: Once | INTRAVENOUS | Status: DC

## 2019-06-03 MED ORDER — POTASSIUM CHLORIDE 10 MEQ/100ML IV SOLN
10.0000 meq | INTRAVENOUS | Status: AC
  Administered 2019-06-03 (×2): 10 meq via INTRAVENOUS
  Filled 2019-06-03 (×2): qty 100

## 2019-06-03 MED ORDER — FAMOTIDINE 20 MG PO TABS
20.0000 mg | ORAL_TABLET | Freq: Two times a day (BID) | ORAL | Status: DC
  Administered 2019-06-03 – 2019-06-05 (×5): 20 mg via ORAL
  Filled 2019-06-03 (×5): qty 1

## 2019-06-03 MED ORDER — POTASSIUM CHLORIDE 20 MEQ PO PACK
40.0000 meq | PACK | Freq: Once | ORAL | Status: AC
  Administered 2019-06-04: 40 meq via ORAL
  Filled 2019-06-03: qty 2

## 2019-06-03 NOTE — Progress Notes (Signed)
CRITICAL VALUE ALERT  Critical Value:  Potassium 2.7  Date & Time Notied: 06/03/19 0650  Provider Notified: MD paged, message left.

## 2019-06-03 NOTE — Consult Note (Signed)
MFM Note  This patient has been hospitalized due to diabetic ketoacidosis which is now resolved.  She has also been diagnosed with preeclampsia based on her elevated blood pressures and 24-hour urine results showing 1.2 g of protein.  Her blood pressures remain in the 130s to 140s over 80s to 90s range.  Oligohydramnios had been noted on her prenatal ultrasounds earlier this week.  The patient has already received a complete course of antenatal corticosteroids.  A biophysical profile performed today was 8 out of 8.  The amniotic fluid level has increased to 9.2 cm now that she is better hydrated.  Due to preeclampsia with mildly elevated liver function tests in the 50s range, I would recommend that she continue inpatient management until delivery.    The patient's liver function tests may not be increasing due to the effects of the antenatal corticosteroids she recently received.  I anticipate that her lab work may become more abnormal once the steroid effects go away.  Usually, expectant management of severe preeclampsia is successful in delaying delivery between 5 to 7 days.  She should continue to be followed with daily PIH labs.   I would recommend delivery should her liver function tests continue to increase, should her platelet levels decrease, or should her blood pressures be persistently greater than 150/ 100s.

## 2019-06-03 NOTE — Progress Notes (Signed)
CRITICAL VALUE ALERT  Critical Value:  Potassium 2.6  Date & Time Notied:  06/03/2019 @ 1535  Provider Notified: Dr. Jerral Ralph  Orders Received/Actions taken: new orders received for runs of potassium.

## 2019-06-03 NOTE — Progress Notes (Signed)
No C/O except reflux No SOB, no cough, no bleeding, no HA Tolerating regular diet Ambulating well Good FM  Today's Vitals   06/03/19 0841 06/03/19 0940 06/03/19 1045 06/03/19 1200  BP:      Pulse:      Resp:      Temp:      TempSrc:      SpO2:      Weight:      Height:      PainSc: 0-No pain 0-No pain 0-No pain Asleep   Body mass index is 36.84 kg/m.  BP 130/77 O2 sat 99% RA  A/P: FWB-FHT cat one         BPP this am-verbal report BPP 8/8, written report pending         BMZ last dose 3:30 am yesterday          Preeclampsia-BPs mild/moderate, there was a severe range BP @ 0048 today that resolved without treatment.         LFT 50s stable, creatinine and plts normal         No symptoms currently         UPC 0.82         24 hr urine=1.22 gm ptn         MFM following          DKA-resolved         Management per Hospitalist and Diabetes Management        BMZ complete, can start SQ insulin tomorrow         Covid + management per Hospitalist

## 2019-06-03 NOTE — Progress Notes (Addendum)
PROGRESS NOTE                                                                                                                                                                                                             Patient Demographics:    Kelli Mcpherson, is a 24 y.o. female, DOB - 1995/03/20, GBT:[id number]  Outpatient Primary MD for the patient is Tyson Dense, MD   Admit date - 05/29/2019   LOS - 5  No chief complaint on file.      Brief Narrative: Patient is a 24 y.o. female with PMHx of gestational diabetes-on Metformin-who presented with DKA and COVID-19 pneumonia.  Hospitalist service consulted for management of these conditions.  Significant Events: 4/27>> Covid positive 5/2>> ED visit for S OB/nausea/vomiting and lower abdominal pain-given IV fluids and discharged home. 5/4>> ED visit with nausea/vomiting-discharged home 5/9>> admit for COVID-19 pneumonia, DKA, hospitalist service consulted.  COVID-19 medications: Steroids: 5/11>> Remdesivir: 5/9>>  Antibiotics: None  Microbiology data: 5/9: Urine culture: <10,000 colonies/mL insignificant growth, no group B strep isolated  DVT prophylaxis: SQ Lovenox  Procedures: None  Consults: None    Subjective:   She appears stable-on room air-does not have any complaints apart from mild soreness of her throat.  She was ambulating from the bathroom to her bed when I walked in-she did not have any shortness of breath.    Assessment  & Plan :   Acute Hypoxic Resp Failure due to Covid 19 Viral pneumonia: Hypoxia has resolved-her O2 saturations are persistently above 95%.  She has completed a course of remdesivir-since she is not hypoxic-she does not require steroids for COVID-19.   Fever: afebrile O2 requirements:  SpO2: 99 % O2 Flow Rate (L/min): 2 L/min  Prone/Incentive Spirometry: encouraged incentive spirometry use 3-4/hour.  COVID-19  Labs: Recent Labs    06/01/19 0314 06/02/19 0020 06/03/19 0443  CRP 6.3* 4.5* 1.1*       Component Value Date/Time   BNP 26.1 05/29/2019 1247    Recent Labs  Lab 05/29/19 1247  PROCALCITON 0.23    Lab Results  Component Value Date   SARSCOV2NAA POSITIVE (A) 05/17/2019    DKA/starvation ketoacidosis (A1c 7.3): Resolved-she was kept on IV insulin-as she received betamethasone-we will need to touch base with OB to see if we can transition her to SQ insulin.  Hypokalemia: We will replete-and recheck later this afternoon.  Subclinical hyperthyroidism: Prior physician discussed with endocrinology-recommendations are for outpatient follow-up.  Thyrotropin receptor antibody negative.  SVT: Continue telemetry monitoring-appreciate cardiology input.  Echo with preserved EF  Mild transaminitis: Likely secondary to COVID-19-continue to monitor-as could be related to underlying pregnancy related complications.  Has been relatively stable for the past several days-continue to follow periodically.  32 weeks intrauterine pregnancy with probable preeclampsia: Deferred to the primary service  Obesity: Estimated body mass index is 36.84 kg/m as calculated from the following:   Height as of this encounter: 6' (1.829 m).   Weight as of this encounter: 123.2 kg.   ABG:    Component Value Date/Time   HCO3 13.7 (L) 05/29/2019 1746   ACIDBASEDEF 11.2 (H) 05/29/2019 1746   O2SAT 88.5 05/29/2019 1746   Vent Settings: N/A  Condition -  Guarded  Family Communication  : Patient prefers to update family herself-I have asked to let me know if family has any Covid related questions.  Code Status :  Full Code  Diet :  Diet Order            Diet gestational carb mod Room service appropriate? Yes; Fluid consistency: Thin  Diet effective now               Disposition Plan  :   Per primary service  Antimicorbials  :    Anti-infectives (From admission, onward)   Start     Dose/Rate  Route Frequency Ordered Stop   05/30/19 1000  remdesivir 100 mg in sodium chloride 0.9 % 100 mL IVPB  Status:  Discontinued     100 mg 200 mL/hr over 30 Minutes Intravenous Daily 05/29/19 1345 05/29/19 1347   05/30/19 1000  remdesivir 100 mg in sodium chloride 0.9 % 100 mL IVPB     100 mg 200 mL/hr over 30 Minutes Intravenous Daily 05/29/19 1348 06/02/19 1037   05/29/19 1400  remdesivir 200 mg in sodium chloride 0.9% 250 mL IVPB     200 mg 580 mL/hr over 30 Minutes Intravenous Once 05/29/19 1348 05/29/19 1602   05/29/19 1345  remdesivir 200 mg in sodium chloride 0.9% 250 mL IVPB  Status:  Discontinued     200 mg 580 mL/hr over 30 Minutes Intravenous Once 05/29/19 1345 05/29/19 1347      Inpatient Medications  Scheduled Meds: . albuterol  2 puff Inhalation Q6H  . Chlorhexidine Gluconate Cloth  6 each Topical Daily  . clotrimazole  1 Applicatorful Vaginal QHS  . docusate sodium  100 mg Oral Daily  . enoxaparin (LOVENOX) injection  60 mg Subcutaneous Q24H  . prenatal multivitamin  1 tablet Oral Q1200  . sodium chloride flush  10-40 mL Intracatheter Q12H  . sodium chloride flush  3 mL Intravenous Q12H   Continuous Infusions: . albumin human    . dextrose 5 % and 0.45% NaCl 125 mL/hr (06/03/19 0650)  . insulin 1.6 Units/hr (06/03/19 1052)  . potassium chloride 10 mEq (06/03/19 1044)  . potassium chloride     PRN Meds:.[DISCONTINUED] acetaminophen **OR** acetaminophen, acetaminophen, bisacodyl, calcium carbonate, dextrose, menthol-cetylpyridinium, ondansetron **OR** ondansetron (ZOFRAN) IV, phenol, polyethylene glycol, promethazine, sodium chloride flush, triamcinolone cream, zolpidem   Time Spent in minutes  25  See all Orders from today for further details   Jeoffrey Massed M.D on 06/03/2019 at 11:06 AM  To page go to [email or web address] - use universal password  [facility] -  Office  [phone]  Objective:   Vitals:   06/03/19 0053 06/03/19 0330 06/03/19 0340  06/03/19 0743  BP: (!) 156/99 (!) 139/96  130/77  Pulse: (!) 115 (!) 112  (!) 107  Resp: 18 20  20   Temp:  98.5 F (36.9 C)  98.2 F (36.8 C)  TempSrc:  Oral  Oral  SpO2: 98% 99% 97% 99%  Weight:      Height:        Wt Readings from Last 3 Encounters:  05/29/19 123.2 kg  05/21/19 (!) 136.5 kg  05/18/19 (!) 136.5 kg     Intake/Output Summary (Last 24 hours) at 06/03/2019 1106 Last data filed at 06/03/2019 1045 Gross per 24 hour  Intake 4774.31 ml  Output 3500 ml  Net 1274.31 ml     Physical Exam Gen Exam:Alert awake-not in any distress HEENT:atraumatic, normocephalic Chest: B/L clear to auscultation anteriorly CVS:S1S2 regular Abdomen:soft non tender, non distended Extremities:no edema Neurology: Non focal Skin: no rash   Data Review:    CBC Recent Labs  Lab 05/30/19 0704 05/31/19 0510 06/01/19 0314 06/02/19 0020 06/03/19 0443  WBC 6.4 7.3 5.6 6.7 6.1  HGB 11.5* 11.9* 11.7* 11.8* 9.7*  HCT 35.4* 36.9 35.5* 36.1 30.7*  PLT 233 194 187 204 173  MCV 86.1 85.6 84.5 85.3 86.5  MCH 28.0 27.6 27.9 27.9 27.3  MCHC 32.5 32.2 33.0 32.7 31.6  RDW 13.6 13.6 13.4 13.5 13.3  LYMPHSABS 1.0 1.1 1.4 1.7 1.7  MONOABS 0.4 0.6 0.4 0.4 0.7  EOSABS 0.1 0.1 0.1 0.1 0.0  BASOSABS 0.0 0.0 0.0 0.0 0.0    Chemistries  Recent Labs  Lab 05/29/19 1247 05/29/19 1718 05/30/19 0704 05/30/19 0704 05/30/19 1954 05/30/19 1954 05/31/19 0510 05/31/19 1800 06/01/19 0314 06/01/19 0314 06/01/19 0738 06/01/19 1300 06/01/19 1527 06/02/19 0020 06/03/19 0443  NA 147*   < > 150*   < > 147*   < > 145   < > 146*   < > 144 140 141 140 139  K 4.1   < > 3.4*   < > 3.4*   < > 3.3*   < > 3.0*   < > 3.3* 3.3* 3.4* 3.1* 2.7*  CL 123*   < > 127*   < > 122*   < > 118*   < > 115*   < > 114* 111 108 111 111  CO2 11*   < > 14*   < > 15*   < > 15*   < > 23   < > 22 19* 19* 20* 20*  GLUCOSE 232*   < > 132*   < > 116*   < > 121*   < > 106*   < > 141* 195* 154* 98 89  BUN 12   < > 9   < > 10   <  > 9   < > 8   < > 8 9 10 7  5*  CREATININE 0.96   < > 1.03*   < > 0.87   < > 0.97   < > 0.93   < > 0.81 1.00 0.93 0.86 0.84  CALCIUM 10.2   < > 10.5*   < > 10.2   < > 10.0   < > 9.2   < > 9.0 9.3 9.3 9.2 8.6*  MG 2.0  --   --   --  1.7  --  1.7  --   --   --   --   --   --   --   --  AST 44*  --  51*  --   --   --  48*  --  48*  --   --   --   --  53* 55*  ALT 51*  --  51*  --   --   --  51*  --  50*  --   --   --   --  53* 50*  ALKPHOS 100  --  99  --   --   --  105  --  101  --   --   --   --  105 88  BILITOT 1.2  --  0.9  --   --   --  0.9  --  0.6  --   --   --   --  0.6 0.8   < > = values in this interval not displayed.   ------------------------------------------------------------------------------------------------------------------ No results for input(s): CHOL, HDL, LDLCALC, TRIG, CHOLHDL, LDLDIRECT in the last 72 hours.  Lab Results  Component Value Date   HGBA1C 7.3 (H) 05/29/2019   ------------------------------------------------------------------------------------------------------------------ No results for input(s): TSH, T4TOTAL, T3FREE, THYROIDAB in the last 72 hours.  Invalid input(s): FREET3 ------------------------------------------------------------------------------------------------------------------ No results for input(s): VITAMINB12, FOLATE, FERRITIN, TIBC, IRON, RETICCTPCT in the last 72 hours.  Coagulation profile No results for input(s): INR, PROTIME in the last 168 hours.  No results for input(s): DDIMER in the last 72 hours.  Cardiac Enzymes No results for input(s): CKMB, TROPONINI, MYOGLOBIN in the last 168 hours.  Invalid input(s): CK ------------------------------------------------------------------------------------------------------------------    Component Value Date/Time   BNP 26.1 05/29/2019 1247    Micro Results Recent Results (from the past 240 hour(s))  Urine culture     Status: Abnormal   Collection Time: 05/24/19  9:27 PM    Specimen: Urine, Random  Result Value Ref Range Status   Specimen Description URINE, RANDOM  Final   Special Requests NONE  Final   Culture (A)  Final    30,000 COLONIES/mL LACTOBACILLUS SPECIES Standardized susceptibility testing for this organism is not available. Performed at [facility], [address], [place], [place] [address]    Report Status 05/25/2019 FINAL  Final  OB Urine Culture     Status: Abnormal   Collection Time: 05/29/19 12:08 PM   Specimen: OB Clean Catch; Urine  Result Value Ref Range Status   Specimen Description OB CLEAN CATCH  Final   Special Requests NONE  Final   Culture (A)  Final    <10,000 COLONIES/mL INSIGNIFICANT GROWTH NO GROUP B STREP (S.AGALACTIAE) ISOLATED Performed at [facility], [address], [place], [place] [address]    Report Status 05/30/2019 FINAL  Final    Radiology Reports DG Chest Port 1 View  Result Date: 06/01/2019 CLINICAL DATA:  COVID-19 positive 05/18/2019, PICC placement EXAM: PORTABLE CHEST 1 VIEW COMPARISON:  05/31/2019 at 3:53 p.m. FINDINGS: Single frontal view of the chest again demonstrates left-sided PICC extending over a left-sided mediastinum, in a configuration suggesting persistent left-sided SVC. No significant change since prior study. Cardiac silhouette is stable. Multifocal bilateral airspace disease greatest in the left mid and right lower lung zones unchanged. No effusion or pneumothorax. IMPRESSION: 1. Stable position of the left-sided PICC line, suggesting persistent left-sided superior vena cava. Please correlate with catheter function. 2. Stable multifocal pneumonia. Electronically Signed   By: Sharlet Salina M.D.   On: 06/01/2019 01:13   DG CHEST PORT 1 VIEW  Addendum Date: 05/31/2019   ADDENDUM REPORT: 05/31/2019  16:25 ADDENDUM: It should be noted left SVC typically drains into the coronary sinus and subsequently the right atrium as suggested on recent echocardiogram. Current catheter position  does not reach the right atrium and is satisfactory for usage. Electronically Signed   By: Alcide Clever M.D.   On: 05/31/2019 16:25   Result Date: 05/31/2019 CLINICAL DATA:  Status post PICC line placement EXAM: PORTABLE CHEST 1 VIEW COMPARISON:  05/29/2019 FINDINGS: Cardiac shadow is again mildly prominent. New left-sided PICC line is seen with the catheter tip presenting over the mid cardiac shadow. Recent echocardiography suggested a left superior vena cava and the catheter position with support this diagnosis. The lungs are well aerated bilaterally with patchy opacities consistent with the given clinical history of COVID-19 positivity. No bony abnormality is seen. IMPRESSION: PICC line placement with the catheter tip projecting over the cardiac shadow to the left of the midline likely within a persistent left superior vena cava. Scattered opacities bilaterally consistent with the given clinical history. Electronically Signed: By: Alcide Clever M.D. On: 05/31/2019 16:12   DG CHEST PORT 1 VIEW  Result Date: 05/29/2019 CLINICAL DATA:  Shortness of breath and cough. COVID-19 positive. EXAM: PORTABLE CHEST 1 VIEW COMPARISON:  May 22, 2019 FINDINGS: Cardiomediastinal silhouette is mildly enlarged. Interval development of patchy bilateral airspace opacities with lower lobe predominance. Osseous structures are without acute abnormality. Soft tissues are grossly normal. IMPRESSION: 1. Interval development of patchy bilateral airspace opacities with lower lobe predominance consistent with atypical pneumonia. 2. Enlarged cardiac silhouette. Electronically Signed   By: Ted Mcalpine M.D.   On: 05/29/2019 12:13   DG Chest Port 1 View  Result Date: 05/22/2019 CLINICAL DATA:  Shortness of breath.  COVID-19. EXAM: PORTABLE CHEST 1 VIEW COMPARISON:  08/07/2018 FINDINGS: The heart size and mediastinal contours are within normal limits. Both lungs are clear. The visualized skeletal structures are unremarkable.  IMPRESSION: Clear lungs. Electronically Signed   By: Deatra Robinson M.D.   On: 05/22/2019 02:41   [place] MFM FETAL BPP WO NON STRESS  Result Date: 05/31/2019 ----------------------------------------------------------------------  OBSTETRICS REPORT                       (Signed Final 05/31/2019 06:01 pm) ---------------------------------------------------------------------- Patient Info  ID #:       [id number]                          D.O.B.:  [birth year] (23 yrs)  Name:       Kelli Mcpherson              Visit Date: 05/31/2019 09:27 am ---------------------------------------------------------------------- Performed By  Attending:        Ma Rings MD         Ref. Address:     [facility]  [address]                                                             [place]                                                             [address]  Performed By:     Tommie Raymond BS,       Secondary Phy.:   [facility]                    RDMS, RVT                                                             Care  Referred By:      Candice Camp MD          [facility] ---------------------------------------------------------------------- Orders  #  Description                           Code        Ordered By  1  [place] MFM OB DETAIL +14 WK               76811.01    MEGAN MORRIS  2  [place] MFM UA CORD DOPPLER                76820.02    MEGAN MORRIS  3  [place] MFM FETAL BPP WO NON               60454.09    MEGAN MORRIS     STRESS ----------------------------------------------------------------------  #  Order #                     Accession #                Episode #  1  [id number]                   [id number]  [id number]  2  [id number]                    [id number]                 [id number]  3  [id number]                   [id number]                 [id number] ---------------------------------------------------------------------- Indications  Gestational diabetes in pregnancy,             O24.415  controlled by oral hypoglycemic drugs, DKA  Oligohydraminios, third trimester, unspecified O41.03X0  [redacted] weeks gestation of pregnancy                Z3A.32  Obesity complicating pregnancy, third          O99.213  trimester  Unspecified maternal hypertension, third       O16.3  trimester  COVID19 (+)  (4/27)  Encounter for antenatal screening for          Z36.3  malformations  Encounter for other antenatal screening        Z36.2  follow-up ---------------------------------------------------------------------- Fetal Evaluation  Num Of Fetuses:         1  Fetal Heart Rate(bpm):  142  Cardiac Activity:       Observed  Presentation:           Cephalic  Placenta:               Anterior  P. Cord Insertion:      Not well visualized  Amniotic Fluid  AFI FV:      Oligohydramnios  AFI Sum(cm)     %Tile       Largest Pocket(cm)  5.3             < 3         2.6  RUQ(cm)       RLQ(cm)       LUQ(cm)        LLQ(cm)  1.8           0             0.9            2.6 ---------------------------------------------------------------------- Biophysical Evaluation  Amniotic F.V:   Pocket => 2 cm             F. Tone:        Not Observed  F. Movement:    Observed                   Score:          6/8  F. Breathing:   Observed ---------------------------------------------------------------------- Biometry  BPD:      82.8  mm     G. Age:  33w 2d         76  %    CI:        83.92   %    70 - 86                                                          FL/HC:      21.8   %    19.1 - 21.3  HC:      284.9  mm     G. Age:  31w 2d        4.3  %    HC/AC:      0.98        0.96 - 1.17  AC:       292   mm     G. Age:  33w 1d         79  %    FL/BPD:     75.1   %    71 - 87  FL:       62.2  mm     G.  Age:  32w 2d         39  %    FL/AC:      21.3   %    20 - 24  HUM:        57  mm     G. Age:  33w 1d         71  %  CER:      41.3  mm     G. Age:  35w 2d         87  %  LV:        4.5  mm  Est. FW:    2036  gm      4 lb 8 oz     58  % ---------------------------------------------------------------------- OB History  Gravidity:    1 ---------------------------------------------------------------------- Gestational Age  LMP:           32w 1d        Date:  10/18/18                 EDD:   07/25/19  U/S Today:     32w 4d                                        EDD:   07/22/19  Best:          32w 1d     Det. By:  LMP  (10/18/18)          EDD:   07/25/19 ---------------------------------------------------------------------- Anatomy  Cranium:               Not well visualized    LVOT:                   Appears normal  Cavum:                 Not well visualized    Aortic Arch:            Not well visualized  Ventricles:            Appears normal         Ductal Arch:            Appears normal  Choroid Plexus:        Appears normal         Diaphragm:              Appears normal  Cerebellum:            Not well visualized    Stomach:                Appears normal, left  sided  Posterior Fossa:       Not well visualized    Abdomen:                Appears normal  Nuchal Fold:           Not applicable (>20    Abdominal Wall:         Not well visualized                         wks GA)  Face:                  Orbits nl; profile not Cord Vessels:           Appears normal (3                         well visualized                                vessel cord)  Lips:                  Not well visualized    Kidneys:                Appear normal  Palate:                Not well visualized    Bladder:                Appears normal  Thoracic:              Appears normal         Spine:                  Ltd views no                                                                         intracranial signs of                                                                        NTD  Heart:                 Appears normal         Upper Extremities:      Visualized                         (4CH, axis, and                         situs)  RVOT:                  Appears normal         Lower Extremities:      Visualized  Other:  Technically difficult due to maternal habitus  and fetal position.          Technically difficult due to low amniotic fluid. ---------------------------------------------------------------------- Doppler - Fetal Vessels  Umbilical Artery   S/D     %tile      RI               PI                     ADFV    RDFV   2.96       67    0.66             0.88                        No      No ---------------------------------------------------------------------- Cervix Uterus Adnexa  Cervix  Not visualized (advanced GA >24wks)  Uterus  No abnormality visualized. ---------------------------------------------------------------------- Comments  This patient has been hospitalized due to diabetic  ketoacidosis and possible preeclampsia.  Her blood  pressures remain in the 120s to 150s over 80s to 90s range.  Her blood glucose levels are now in the normal range and her  diabetic ketoacidosis is resolving.  On today's exam, the overall fetal growth was appropriate for  her gestational age.  Oligohydramnios continues to be noted  today.  Doppler studies of the umbilical arteries performed today  shows a normal S/D ratio.  There were no signs of absent or  reversed end-diastolic flow.  A biophysical profile performed today was 6 out of 8.  She  received a -2 for absent fetal tone.  Her 24-hour urine results are currently pending.  Based on  her elevated blood pressures and her elevated P/C ratio, she  probably is developing preeclampsia.  Due to probable preeclampsia at her current gestational age,  now that her diabetic ketoacidosis is resolving, I would  recommend that she receive a  complete course of antenatal  corticosteroids.  She should be kept on IV insulin while she is  receiving her steroid course.  She should have another ultrasound later this week to  reassess the amniotic fluid levels. ----------------------------------------------------------------------                   Ma Rings, MD Electronically Signed Final Report   05/31/2019 06:01 pm ----------------------------------------------------------------------  [place] MFM FETAL BPP WO NON STRESS  Result Date: 05/30/2019 ----------------------------------------------------------------------  OBSTETRICS REPORT                       (Signed Final 05/30/2019 05:11 pm) ---------------------------------------------------------------------- Patient Info  ID #:       [id number]                          D.O.B.:  06-10-1995 (23 yrs)  Name:       Kelli Mcpherson              Visit Date: 05/30/2019 09:39 am ---------------------------------------------------------------------- Performed By  Attending:        Ma Rings MD         Ref. Address:     [facility]                                                             [address]                                                             [place]                                                             [address]  Performed By:     Tommie Raymond BS,       Secondary Phy.:   [facility]                    RDMS, RVT                                                             Care  Referred By:      Candice Camp MD          [facility] ---------------------------------------------------------------------- Orders  #  Description                           Code        Ordered By  1  [place] MFM FETAL BPP WO NON               60454.09    MEGAN MORRIS     STRESS  ----------------------------------------------------------------------  #  Order #                     Accession #                Episode #  1  [id number]                   [id number]  [id number] ---------------------------------------------------------------------- Indications  Oligohydraminios, third trimester, unspecified O41.03X0  Gestational diabetes in pregnancy,             O24.415  controlled by oral hypoglycemic drugs, DKA  Obesity complicating pregnancy, third          O99.213  trimester  Unspecified maternal hypertension, third       O16.3  trimester  COVID19 (+)  (4/27)  [redacted] weeks gestation of pregnancy                Z3A.31 ---------------------------------------------------------------------- Fetal Evaluation  Num Of Fetuses:         1  Fetal Heart Rate(bpm):  166  Cardiac Activity:       Observed  Presentation:           Cephalic  Placenta:               Anterior  P. Cord Insertion:      Not well visualized  Amniotic Fluid  AFI FV:      Oligohydramnios  AFI Sum(cm)     %Tile       Largest Pocket(cm)  2.2             < 3         1.1  RUQ(cm)       RLQ(cm)       LUQ(cm)        LLQ(cm)  1.1           0             1.1            0 ---------------------------------------------------------------------- Biophysical Evaluation  Amniotic F.V:   Oligohydramnios            F. Tone:        Observed  F. Movement:    Not Observed               Score:          4/8  F. Breathing:   Observed ---------------------------------------------------------------------- OB History  Gravidity:    1 ---------------------------------------------------------------------- Gestational Age  LMP:           32w 0d        Date:  10/18/18                 EDD:   07/25/19  Best:          Armida Sans 0d     Det. By:  LMP  (10/18/18)          EDD:   07/25/19 ---------------------------------------------------------------------- Anatomy  Diaphragm:             Appears normal         Kidneys:                Appear normal  Stomach:                Appears normal, left   Bladder:                Appears normal                         sided  Other:  Technically difficult due to low amniotic fluid. Technically difficult due          to maternal habitus and fetal position. ---------------------------------------------------------------------- Cervix Uterus Adnexa  Cervix  Not visualized (advanced GA >24wks)  Uterus  No abnormality visualized.  Right Ovary  Within normal limits. No adnexal mass visualized.  Left Ovary  Within normal limits. No adnexal mass visualized.  Cul De Sac  No free fluid seen.  Adnexa  No abnormality visualized. ---------------------------------------------------------------------- Comments  This patient has been hospitalized due to diabetic  ketoacidosis and persistent nausea and vomiting.  She also  tested positive for COVID-19 about 2 weeks ago.  She is  currently being treated with remdesivir for Covid pneumonia.  She has been receiving IV hydration and IV insulin to help  resolve the diabetic ketoacidosis.  This morning, her fetal heart rate tracing has improved.  There are periods of variability and accelerations.  No further  decelerations are noted.  The patient is also more alert and  awake.  Her labs show hypernatremia with a sodium level of 150,  serum creatinine of 1.03, and AST and ALT that are mildly  elevated (51).  Her fingerstick values are now in the 110s to  120s range.  Her blood pressures remain in the 140s over  80s range.  A biophysical profile performed today was 4 out of 8.  The  patient received -2 for absent fetal movements and also -2 for  anhydramnios.  I reviewed the patient's ultrasound images from yesterday.  A  >2 cm pocket of amniotic fluid was noted yesterday.  Due to her symptoms of persistent nausea and vomiting, her  elevated blood pressures, her elevated liver function tests,  and her elevated serum creatinine level, a diagnosis of  preeclampsia has not been ruled out.  A P/C ratio performed this  afternoon was 0.86 (elevated).  A  24-hour urine collection is underway to assess the total  protein.  A course of antenatal corticosteroids for fetal lung maturity  has been held due to concerns that it may elevate her blood  glucose levels and further worsen her diabetic ketoacidosis.  The patient should be kept on continuous monitoring  overnight.  I would recommend delivery should the fetal  status be nonreassuring.  Should her diabetic ketoacidosis continue to resolve and her  blood pressures remain elevated therefore increasing the  suspicion for preeclampsia, a course of antenatal  corticosteroids should be administered tomorrow morning.  The patient will continue to receive IV hydration overnight.  We will repeat another ultrasound tomorrow to assess the  amniotic fluid levels, the fetal growth, and umbilical artery  Doppler studies. ----------------------------------------------------------------------                   Ma Rings, MD Electronically Signed Final Report   05/30/2019 05:11 pm ----------------------------------------------------------------------  [place] MFM FETAL BPP WO NON STRESS  Result Date: 05/29/2019 ----------------------------------------------------------------------  OBSTETRICS REPORT                       (Signed Final 05/29/2019 12:15 pm) ---------------------------------------------------------------------- Patient Info  ID #:       [id number]                          D.O.B.:  19-Oct-1995 (23 yrs)  Name:       Kelli Mcpherson              Visit Date: 05/29/2019 09:58 am ---------------------------------------------------------------------- Performed By  Attending:        Lin Landsman      Ref. Address:      [facility]                                                              [address]                                                               [place]                                                              [id number]  Performed By:     Birdena Crandall        [facility]  Referred By:      Candice Camp MD ---------------------------------------------------------------------- Orders  #  Description                           Code        Ordered By  1  [place] MFM FETAL BPP WO NON               60454.09    DAVID LOWE     STRESS ----------------------------------------------------------------------  #  Order #                     Accession #                Episode #  1  [id number]                   [id number]                 [id number] ---------------------------------------------------------------------- Indications  Gestational diabetes in pregnancy,              O24.415  controlled by oral hypoglycemic drugs, DKA  Obesity complicating  pregnancy, third           O99.213  trimester  Unspecified maternal hypertension, third        O16.3  trimester  COVID19 (+)  (4/27)  Oligohydraminios, third trimester, unspecified  O41.03X0  [redacted] weeks gestation of pregnancy                 Z3A.31 ---------------------------------------------------------------------- Fetal Evaluation  Num Of Fetuses:          1  Fetal Heart Rate(bpm):   150  Cardiac Activity:        Observed  Presentation:            Cephalic  Placenta:                Anterior  Amniotic Fluid  AFI FV:      Within normal limits  AFI Sum(cm)     %Tile       Largest Pocket(cm)  9.5             12          2.8  RUQ(cm)       RLQ(cm)       LUQ(cm)        LLQ(cm)  2.8           2.5           2.8            1.4 ---------------------------------------------------------------------- Biophysical Evaluation  Amniotic F.V:   Within normal limits       F. Tone:         Observed  F. Movement:    Not Observed               Score:           4/8  F. Breathing:   Not Observed  ---------------------------------------------------------------------- OB History  Gravidity:    1 ---------------------------------------------------------------------- Gestational Age  LMP:           31w 6d        Date:  10/18/18                 EDD:   07/25/19  Best:          31w 6d     Det. By:  LMP  (10/18/18)          EDD:   07/25/19 ---------------------------------------------------------------------- Anatomy  Stomach:               Appears normal, left   Kidneys:                Appear normal                         sided  Abdominal Wall:        Appears nml (cord      Bladder:                Appears normal                         insert, abd wall) ---------------------------------------------------------------------- Impression  Limited exam due to presumed maternal DKA  Biophysical profile 4 out of 8 there is some movement but not  enough to pass the BPP.  Labs are pending with continued fetal monitoring.  I discussed with Dr. Rana Snare the current plan and agree. ---------------------------------------------------------------------- Recommendations  Continueed Fetal monitoring  Hold Betamethsone  DKA protocol including insulin  drip and fluid hydration with  the goal of blood sugars 100-140's.  Will await labs to perform consultation and diagnosis of DKA.  Continue COVID protocol. ----------------------------------------------------------------------               Lin Landsman, MD Electronically Signed Final Report   05/29/2019 12:15 pm ----------------------------------------------------------------------  [place] MFM OB DETAIL +14 WK  Result Date: 05/31/2019 ----------------------------------------------------------------------  OBSTETRICS REPORT                       (Signed Final 05/31/2019 06:01 pm) ---------------------------------------------------------------------- Patient Info  ID #:       [id number]                          D.O.B.:  05-05-1995 (23 yrs)  Name:       Kelli Mcpherson               Visit Date: 05/31/2019 09:27 am ---------------------------------------------------------------------- Performed By  Attending:        Ma Rings MD         Ref. Address:     [facility]                                                             [address]                                                             [place]                                                             [phone]  Performed By:     Tommie Raymond BS,       Secondary Phy.:   [facility]                    [facility]  [facility]  Referred By:      Candice Camp MD          [facility] ---------------------------------------------------------------------- Orders  #  Description                           Code        Ordered By  1  [place] MFM OB DETAIL +14 WK               76811.01    MEGAN MORRIS  2  [place] MFM UA CORD DOPPLER                76820.02    MEGAN MORRIS  3  [place] MFM FETAL BPP WO NON               76819.01    MEGAN MORRIS     STRESS ----------------------------------------------------------------------  #  Order #                     Accession #                Episode #  1  [id number]                   [id number]                 [id number]  2  [id number]                   [id number]                 [id number]  3  [id number]                   [id number]                 [id number] ---------------------------------------------------------------------- Indications  Gestational diabetes in pregnancy,             O24.415  controlled by oral hypoglycemic drugs, DKA  Oligohydraminios, third trimester, unspecified O41.03X0  [redacted] weeks gestation of pregnancy                Z3A.32  Obesity complicating pregnancy, third          O99.213  trimester  Unspecified maternal  hypertension, third       O16.3  trimester  COVID19 (+)  (4/27)  Encounter for antenatal screening for          Z36.3  malformations  Encounter for other antenatal screening        Z36.2  follow-up ---------------------------------------------------------------------- Fetal Evaluation  Num Of Fetuses:         1  Fetal Heart Rate(bpm):  142  Cardiac Activity:       Observed  Presentation:           Cephalic  Placenta:               Anterior  P. Cord Insertion:      Not well visualized  Amniotic Fluid  AFI FV:  Oligohydramnios  AFI Sum(cm)     %Tile       Largest Pocket(cm)  5.3             < 3         2.6  RUQ(cm)       RLQ(cm)       LUQ(cm)        LLQ(cm)  1.8           0             0.9            2.6 ---------------------------------------------------------------------- Biophysical Evaluation  Amniotic F.V:   Pocket => 2 cm             F. Tone:        Not Observed  F. Movement:    Observed                   Score:          6/8  F. Breathing:   Observed ---------------------------------------------------------------------- Biometry  BPD:      82.8  mm     G. Age:  33w 2d         76  %    CI:        83.92   %    70 - 86                                                          FL/HC:      21.8   %    19.1 - 21.3  HC:      284.9  mm     G. Age:  31w 2d        4.3  %    HC/AC:      0.98        0.96 - 1.17  AC:       292   mm     G. Age:  33w 1d         79  %    FL/BPD:     75.1   %    71 - 87  FL:       62.2  mm     G. Age:  32w 2d         39  %    FL/AC:      21.3   %    20 - 24  HUM:        57  mm     G. Age:  33w 1d         71  %  CER:      41.3  mm     G. Age:  35w 2d         87  %  LV:        4.5  mm  Est. FW:    2036  gm      4 lb 8 oz     58  % ---------------------------------------------------------------------- OB History  Gravidity:    1 ---------------------------------------------------------------------- Gestational Age  LMP:           32w 1d        Date:  10/18/18  EDD:   07/25/19  U/S  Today:     32w 4d                                        EDD:   07/22/19  Best:          32w 1d     Det. By:  LMP  (10/18/18)          EDD:   07/25/19 ---------------------------------------------------------------------- Anatomy  Cranium:               Not well visualized    LVOT:                   Appears normal  Cavum:                 Not well visualized    Aortic Arch:            Not well visualized  Ventricles:            Appears normal         Ductal Arch:            Appears normal  Choroid Plexus:        Appears normal         Diaphragm:              Appears normal  Cerebellum:            Not well visualized    Stomach:                Appears normal, left                                                                        sided  Posterior Fossa:       Not well visualized    Abdomen:                Appears normal  Nuchal Fold:           Not applicable (>20    Abdominal Wall:         Not well visualized                         wks GA)  Face:                  Orbits nl; profile not Cord Vessels:           Appears normal (3                         well visualized                                vessel cord)  Lips:                  Not well visualized    Kidneys:                Appear normal  Palate:  Not well visualized    Bladder:                Appears normal  Thoracic:              Appears normal         Spine:                  Ltd views no                                                                        intracranial signs of                                                                        NTD  Heart:                 Appears normal         Upper Extremities:      Visualized                         (4CH, axis, and                         situs)  RVOT:                  Appears normal         Lower Extremities:      Visualized  Other:  Technically difficult due to maternal habitus and fetal position.          Technically difficult due to low amniotic fluid.  ---------------------------------------------------------------------- Doppler - Fetal Vessels  Umbilical Artery   S/D     %tile      RI               PI                     ADFV    RDFV   2.96       67    0.66             0.88                        No      No ---------------------------------------------------------------------- Cervix Uterus Adnexa  Cervix  Not visualized (advanced GA >24wks)  Uterus  No abnormality visualized. ---------------------------------------------------------------------- Comments  This patient has been hospitalized due to diabetic  ketoacidosis and possible preeclampsia.  Her blood  pressures remain in the 120s to 150s over 80s to 90s range.  Her blood glucose levels are now in the normal range and her  diabetic ketoacidosis is resolving.  On today's exam, the overall fetal growth was appropriate for  her gestational age.  Oligohydramnios continues to be noted  today.  Doppler studies of the umbilical arteries performed today  shows a normal S/D ratio.  There were no signs  of absent or  reversed end-diastolic flow.  A biophysical profile performed today was 6 out of 8.  She  received a -2 for absent fetal tone.  Her 24-hour urine results are currently pending.  Based on  her elevated blood pressures and her elevated P/C ratio, she  probably is developing preeclampsia.  Due to probable preeclampsia at her current gestational age,  now that her diabetic ketoacidosis is resolving, I would  recommend that she receive a complete course of antenatal  corticosteroids.  She should be kept on IV insulin while she is  receiving her steroid course.  She should have another ultrasound later this week to  reassess the amniotic fluid levels. ----------------------------------------------------------------------                   Ma Rings, MD Electronically Signed Final Report   05/31/2019 06:01 pm ----------------------------------------------------------------------  ECHOCARDIOGRAM  COMPLETE  Result Date: 05/31/2019    ECHOCARDIOGRAM REPORT   Patient Name:   Kelli Mcpherson Date of Exam: 05/31/2019 Medical Rec #:  [id number]          Height:       72.0 in Accession #:    [id number]         Weight:       271.6 lb Date of Birth:  [birth year]          BSA:          2.427 m Patient Age:    23 years           BP:           133/68 mmHg Patient Gender: F                  HR:           109 bpm. Exam Location:  Inpatient Procedure: 2D Echo, Color Doppler and Cardiac Doppler Indications:    I47.1 SVT  History:        Patient has no prior history of Echocardiogram examinations.                 COVID+ 05/17/19                 [redacted] Weeks Pregnant at time of study.  Sonographer:    Irving Burton Senior RDCS Referring Phys: [id number] DAWOOD S Randol Kern  Sonographer Comments: Technically difficult due to patient body habitus. IMPRESSIONS  1. Left ventricular ejection fraction, by estimation, is 50 to 55%. The left ventricle has low normal function. The left ventricle demonstrates global hypokinesis. Indeterminate diastolic filling due to E-A fusion. There is abnormal (paradoxical) septal  motion, consistent with right ventricular volume overload.  2. Right ventricular systolic function is normal. The right ventricular size is normal. Tricuspid regurgitation signal is inadequate for assessing PA pressure.  3. 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 The inferior vena cava is also dilated, but the right heart chambers are not enlarged.  4. The mitral valve is normal in structure. No evidence of mitral valve regurgitation. No evidence of mitral stenosis.  5. The aortic valve is normal in structure. Aortic valve regurgitation is not visualized. No aortic stenosis is present.  6. The inferior vena cava is dilated in size with <50% respiratory variability, suggesting right atrial pressure of 15 mmHg. Comparison(s): No prior Echocardiogram. Conclusion(s)/Recommendation(s): Recommend a  saline contrast injection from a vein in the left upper extremity, with imaging of the coronary sinus. FINDINGS  Left Ventricle:  Left ventricular ejection fraction, by estimation, is 50 to 55%. The left ventricle has low normal function. The left ventricle demonstrates global hypokinesis. The left ventricular internal cavity size was normal in size. There is no left ventricular hypertrophy. Abnormal (paradoxical) septal motion, consistent with right ventricular volume overload. Indeterminate diastolic filling due to E-A fusion. Right Ventricle: The right ventricular size is normal. No increase in right ventricular wall thickness. Right ventricular systolic function is normal. Tricuspid regurgitation signal is inadequate for assessing PA pressure. Left Atrium: Left atrial size was normal in size. 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 The inferior vena cava is also dilated, but the right heart chambers are not enlarged. Right atrial size was normal in size. Pericardium: There is no evidence of pericardial effusion. Mitral Valve: The mitral valve is normal in structure. Normal mobility of the mitral valve leaflets. No evidence of mitral valve regurgitation. No evidence of mitral valve stenosis. Tricuspid Valve: The tricuspid valve is normal in structure. Tricuspid valve regurgitation is not demonstrated. No evidence of tricuspid stenosis. Aortic Valve: The aortic valve is normal in structure. Aortic valve regurgitation is not visualized. No aortic stenosis is present. Pulmonic Valve: The pulmonic valve was normal in structure. Pulmonic valve regurgitation is not visualized. No evidence of pulmonic stenosis. Aorta: The aortic root is normal in size and structure. Venous: The inferior vena cava is dilated in size with less than 50% respiratory variability, suggesting right atrial pressure of 15 mmHg. IAS/Shunts: No atrial level shunt detected by color flow  Doppler.  LEFT VENTRICLE PLAX 2D LVIDd:         5.00 cm LVIDs:         3.80 cm LV PW:         1.10 cm LV IVS:        1.40 cm LVOT diam:     2.00 cm LV SV:         56 LV SV Index:   23 LVOT Area:     3.14 cm  RIGHT VENTRICLE RV S prime:     12.20 cm/s TAPSE (M-mode): 1.8 cm LEFT ATRIUM           Index       RIGHT ATRIUM           Index LA diam:      3.20 cm 1.32 cm/m  RA Area:     17.60 cm LA Vol (A2C): 45.3 ml 18.67 ml/m RA Volume:   51.10 ml  21.06 ml/m LA Vol (A4C): 47.7 ml 19.66 ml/m  AORTIC VALVE LVOT Vmax:   104.00 cm/s LVOT Vmean:  79.700 cm/s LVOT VTI:    0.179 m  AORTA Ao Root diam: 3.30 cm Ao Asc diam:  2.80 cm  SHUNTS Systemic VTI:  0.18 m Systemic Diam: 2.00 cm Thurmon Fair MD Electronically signed by Thurmon Fair MD Signature Date/Time: 05/31/2019/9:09:24 AM    Final    [place] EKG SITE RITE  Result Date: 05/31/2019 If Site Rite image not attached, placement could not be confirmed due to current cardiac rhythm.  [place] MFM UA CORD DOPPLER  Result Date: 05/31/2019 ----------------------------------------------------------------------  OBSTETRICS REPORT                       (Signed Final 05/31/2019 06:01 pm) ---------------------------------------------------------------------- Patient Info  ID #:       [id number]  D.O.B.:  1995-09-29 (23 yrs)  Name:       Kelli Mcpherson              Visit Date: 05/31/2019 09:27 am ---------------------------------------------------------------------- Performed By  Attending:        Ma Rings MD         Ref. Address:     [facility]                                                             [address]                                                             [place]                                                             [phone]  Performed By:     Tommie Raymond BS,       Secondary Phy.:    [facility]                    [facility]                                                             [facility]  Referred By:      Candice Camp MD          [facility] ---------------------------------------------------------------------- Orders  #  Description  Code        Ordered By  1  [place] MFM OB DETAIL +14 WK               [id number]    MEGAN MORRIS  2  [place] MFM UA CORD DOPPLER                76820.02    MEGAN MORRIS  3  [place] MFM FETAL BPP WO NON               76819.01    MEGAN MORRIS     STRESS ----------------------------------------------------------------------  #  Order #                     Accession #                Episode #  1  [id number]                   [id number]                 [id number]  2  [id number]                   [id number]                 [id number]  3  [id number]                   [id number]                 [id number] ---------------------------------------------------------------------- Indications  Gestational diabetes in pregnancy,             O24.415  controlled by oral hypoglycemic drugs, DKA  Oligohydraminios, third trimester, unspecified O41.03X0  [redacted] weeks gestation of pregnancy                Z3A.32  Obesity complicating pregnancy, third          O99.213  trimester  Unspecified maternal hypertension, third       O16.3  trimester  COVID19 (+)  (4/27)  Encounter for antenatal screening for          Z36.3  malformations  Encounter for other antenatal screening        Z36.2  follow-up ---------------------------------------------------------------------- Fetal Evaluation  Num Of Fetuses:         1  Fetal Heart Rate(bpm):  142  Cardiac Activity:       Observed  Presentation:           Cephalic  Placenta:               Anterior  P. Cord Insertion:      Not well visualized  Amniotic Fluid  AFI FV:      Oligohydramnios  AFI Sum(cm)     %Tile       Largest Pocket(cm)  5.3             <  3         2.6  RUQ(cm)       RLQ(cm)       LUQ(cm)        LLQ(cm)  1.8           0             0.9            2.6 ---------------------------------------------------------------------- Biophysical Evaluation  Amniotic F.V:   Pocket => 2 cm  F. Tone:        Not Observed  F. Movement:    Observed                   Score:          6/8  F. Breathing:   Observed ---------------------------------------------------------------------- Biometry  BPD:      82.8  mm     G. Age:  33w 2d         76  %    CI:        83.92   %    70 - 86                                                          FL/HC:      21.8   %    19.1 - 21.3  HC:      284.9  mm     G. Age:  31w 2d        4.3  %    HC/AC:      0.98        0.96 - 1.17  AC:       292   mm     G. Age:  33w 1d         79  %    FL/BPD:     75.1   %    71 - 87  FL:       62.2  mm     G. Age:  32w 2d         39  %    FL/AC:      21.3   %    20 - 24  HUM:        57  mm     G. Age:  33w 1d         71  %  CER:      41.3  mm     G. Age:  35w 2d         87  %  LV:        4.5  mm  Est. FW:    2036  gm      4 lb 8 oz     58  % ---------------------------------------------------------------------- OB History  Gravidity:    1 ---------------------------------------------------------------------- Gestational Age  LMP:           32w 1d        Date:  10/18/18                 EDD:   07/25/19  U/S Today:     32w 4d                                        EDD:   07/22/19  Best:          32w 1d     Det. By:  LMP  (10/18/18)          EDD:   07/25/19 ---------------------------------------------------------------------- Anatomy  Cranium:               Not well visualized    LVOT:  Appears normal  Cavum:                 Not well visualized    Aortic Arch:            Not well visualized  Ventricles:            Appears normal         Ductal Arch:            Appears normal  Choroid Plexus:        Appears normal         Diaphragm:              Appears normal  Cerebellum:             Not well visualized    Stomach:                Appears normal, left                                                                        sided  Posterior Fossa:       Not well visualized    Abdomen:                Appears normal  Nuchal Fold:           Not applicable (>20    Abdominal Wall:         Not well visualized                         wks GA)  Face:                  Orbits nl; profile not Cord Vessels:           Appears normal (3                         well visualized                                vessel cord)  Lips:                  Not well visualized    Kidneys:                Appear normal  Palate:                Not well visualized    Bladder:                Appears normal  Thoracic:              Appears normal         Spine:                  Ltd views no  intracranial signs of                                                                        NTD  Heart:                 Appears normal         Upper Extremities:      Visualized                         (4CH, axis, and                         situs)  RVOT:                  Appears normal         Lower Extremities:      Visualized  Other:  Technically difficult due to maternal habitus and fetal position.          Technically difficult due to low amniotic fluid. ---------------------------------------------------------------------- Doppler - Fetal Vessels  Umbilical Artery   S/D     %tile      RI               PI                     ADFV    RDFV   2.96       67    0.66             0.88                        No      No ---------------------------------------------------------------------- Cervix Uterus Adnexa  Cervix  Not visualized (advanced GA >24wks)  Uterus  No abnormality visualized. ---------------------------------------------------------------------- Comments  This patient has been hospitalized due to diabetic  ketoacidosis and possible preeclampsia.  Her blood  pressures  remain in the 120s to 150s over 80s to 90s range.  Her blood glucose levels are now in the normal range and her  diabetic ketoacidosis is resolving.  On today's exam, the overall fetal growth was appropriate for  her gestational age.  Oligohydramnios continues to be noted  today.  Doppler studies of the umbilical arteries performed today  shows a normal S/D ratio.  There were no signs of absent or  reversed end-diastolic flow.  A biophysical profile performed today was 6 out of 8.  She  received a -2 for absent fetal tone.  Her 24-hour urine results are currently pending.  Based on  her elevated blood pressures and her elevated P/C ratio, she  probably is developing preeclampsia.  Due to probable preeclampsia at her current gestational age,  now that her diabetic ketoacidosis is resolving, I would  recommend that she receive a complete course of antenatal  corticosteroids.  She should be kept on IV insulin while she is  receiving her steroid course.  She should have another ultrasound later this week to  reassess the amniotic fluid levels. ----------------------------------------------------------------------                   Ma Rings, MD  Electronically Signed Final Report   05/31/2019 06:01 pm ----------------------------------------------------------------------

## 2019-06-03 NOTE — Progress Notes (Signed)
Labs drawn from PICC line at this time

## 2019-06-03 NOTE — Progress Notes (Addendum)
Inpatient Diabetes Program Recommendations  AACE/ADA: New Consensus Statement on Inpatient Glycemic Control (2015)  Target Ranges:  Prepandial:   less than 140 mg/dL      Peak postprandial:   less than 180 mg/dL (1-2 hours)      Critically ill patients:  140 - 180 mg/dL   Lab Results  Component Value Date   GLUCAP 160 (H) 06/03/2019   HGBA1C 7.3 (H) 05/29/2019    Review of Glycemic Control Results for HARRY, Kelli Mcpherson" (MRN [id number]) as of 06/03/2019 09:23  Ref. Range 06/03/2019 05:24 06/03/2019 06:36 06/03/2019 07:37 06/03/2019 08:45  Glucose-Capillary Latest Ref Range: 70 - 99 mg/dL 80 92 570 (H) 177 (H)   Diabetes history:GDM Outpatient Diabetes medications:Metformin Current orders for Inpatient glycemic control:Endotool/DKA in preg  COVID + and DKA (31 weeks) BMZ x2   Inpatient Diabetes Program Recommendations:  If plan if to transition patient off IV insulin now that 24 hours post steroids, consider:  -Levemir 50 units 2 hours prior to discontinuation of IV insulin, then BID to follow. -Novolog 18 units TID (assuming patient is consuming >50% of meals). -Novolog 0-16 units Q4H for first 24 hours, then TID to follow (under Diabetes Treatment in Pregnancy order set).    If OB anticipating delivery, would continue with IV insulin. Discussed recommendations with Dr Jerral Ralph.  I anticipate this patient will continue to require titration.  Thanks, Lujean Rave, MSN, RNC-OB Diabetes Coordinator [phone] (8a-5p)

## 2019-06-03 NOTE — Progress Notes (Signed)
CRITICAL VALUE ALERT  Critical Value:  Glucose 565  Date & Time Notied:  06/03/19 @1535   Provider Notified: Dr  Orders Received/Actions taken:  Results likely innaccurate due to PICC line blood draw. Per MD, will repeat CMP now.

## 2019-06-04 LAB — GLUCOSE, CAPILLARY
Glucose-Capillary: 133 mg/dL — ABNORMAL HIGH (ref 70–99)
Glucose-Capillary: 121 mg/dL — ABNORMAL HIGH (ref 70–99)
Glucose-Capillary: 113 mg/dL — ABNORMAL HIGH (ref 70–99)
Glucose-Capillary: 107 mg/dL — ABNORMAL HIGH (ref 70–99)
Glucose-Capillary: 96 mg/dL (ref 70–99)
Glucose-Capillary: 80 mg/dL (ref 70–99)
Glucose-Capillary: 91 mg/dL (ref 70–99)
Glucose-Capillary: 123 mg/dL — ABNORMAL HIGH (ref 70–99)
Glucose-Capillary: 122 mg/dL — ABNORMAL HIGH (ref 70–99)
Glucose-Capillary: 100 mg/dL — ABNORMAL HIGH (ref 70–99)
Glucose-Capillary: 101 mg/dL — ABNORMAL HIGH (ref 70–99)
Glucose-Capillary: 82 mg/dL (ref 70–99)
Glucose-Capillary: 75 mg/dL (ref 70–99)
Glucose-Capillary: 71 mg/dL (ref 70–99)
Glucose-Capillary: 110 mg/dL — ABNORMAL HIGH (ref 70–99)
Glucose-Capillary: 116 mg/dL — ABNORMAL HIGH (ref 70–99)

## 2019-06-04 LAB — COMPREHENSIVE METABOLIC PANEL
ALT: 57 U/L — ABNORMAL HIGH (ref 0–44)
AST: 56 U/L — ABNORMAL HIGH (ref 15–41)
Albumin: 2 g/dL — ABNORMAL LOW (ref 3.5–5.0)
Alkaline Phosphatase: 99 U/L (ref 38–126)
Anion gap: 8 (ref 5–15)
BUN: <5 mg/dL — ABNORMAL LOW (ref 6–20)
CO2: 23 mmol/L (ref 22–32)
Calcium: 9.2 mg/dL (ref 8.9–10.3)
Chloride: 111 mmol/L (ref 98–111)
Creatinine, Ser: 0.84 mg/dL (ref 0.44–1.00)
GFR calc Af Amer: >60 mL/min (ref >60)
GFR calc non Af Amer: >60 mL/min (ref >60)
Glucose, Bld: 135 mg/dL — ABNORMAL HIGH (ref 70–99)
Potassium: 4 mmol/L (ref 3.5–5.1)
Sodium: 142 mmol/L (ref 135–145)
Total Bilirubin: 0.8 mg/dL (ref 0.3–1.2)
Total Protein: 5.3 g/dL — ABNORMAL LOW (ref 6.5–8.1)

## 2019-06-04 LAB — MAGNESIUM: Magnesium: 1.4 mg/dL — ABNORMAL LOW (ref 1.7–2.4)

## 2019-06-04 LAB — CBC
HCT: 30.8 % — ABNORMAL LOW (ref 36.0–46.0)
Hemoglobin: 9.8 g/dL — ABNORMAL LOW (ref 12.0–15.0)
MCH: 27.9 pg (ref 26.0–34.0)
MCHC: 31.8 g/dL (ref 30.0–36.0)
MCV: 87.7 fL (ref 80.0–100.0)
Platelets: 156 10*3/uL (ref 150–400)
RBC: 3.51 MIL/uL — ABNORMAL LOW (ref 3.87–5.11)
RDW: 13.9 % (ref 11.5–15.5)
WBC: 5.4 10*3/uL (ref 4.0–10.5)
nRBC: 1.7 % — ABNORMAL HIGH (ref 0.0–0.2)

## 2019-06-04 MED ORDER — MAGNESIUM SULFATE 2 GM/50ML IV SOLN
2.0000 g | Freq: Once | INTRAVENOUS | Status: AC
  Administered 2019-06-04: 2 g via INTRAVENOUS
  Filled 2019-06-04: qty 50

## 2019-06-04 NOTE — Progress Notes (Signed)
Continuous EFM d/c per MD verbal order at [facility].

## 2019-06-04 NOTE — Progress Notes (Signed)
Pt given [employer] Libre2 monitor with education on how to apply monitor and basic monitoring.  If pt is sent home with monitor then further teaching will happen at DC.  Monitor sensor applied by RN with teaching about when to alert the nurse.   Pt states that she had a similar monitor at home prior to admission into hospital but it "came loose" in the car and she never replaced it.  Pt states she used an app to track BS and that she used it for about a week.  Pt does not have any questions at this time. BS with glucometer and monitor checked, with results within 7 points of one another.  Will begin monitoring BS with sensor q 1 hour until D/C.

## 2019-06-04 NOTE — Progress Notes (Addendum)
Inpatient Diabetes Program Recommendations  Diabetes Treatment Program Recommendations  ADA Standards of Care 2021 Diabetes in Pregnancy Target Glucose Ranges:  Fasting: 60 - 90 mg/dL Preprandial: 60 - 801 mg/dL 1 hr postprandial: Less than 140mg /dL (from first bite of meal) 2 hr postprandial: Less than 120 mg/dL (from first bite of meal)   Results for KAYELYNN, ABDOU" (MRN [place]) as of 06/04/2019 08:26  Ref. Range 06/04/2019 01:29 06/04/2019 01:59 06/04/2019 02:47 06/04/2019 04:05 06/04/2019 05:00 06/04/2019 06:51 06/04/2019 08:00  Glucose-Capillary Latest Ref Range: 70 - 99 mg/dL 06/06/2019 (H) 078 (H) 675 (H) 107 (H) 96 80 91   Review of Glycemic Control  Current orders for Inpatient glycemic control: IV insulin  Inpatient Diabetes Program Recommendations:   Insulin for transition from IV to SQ: Once MD is read to transition, please consider ordering Levemir 20 units BID, Novolog 9 units TID with meals for meal coverage if patient eats at least 50% of meals, Novolog 0-16 units Q4H (for first 24 hours and TID under Diabetes Treatment in Pregnancy order set).  NOTE: In reviewing glucose trends over the past 24 hours, glucose has ranged from 80-160 mg/dl and patient has received a total of 88.5 units of IV insulin.   Thanks, 449, RN, MSN, CDE Diabetes Coordinator Inpatient Diabetes Program [phone] (Team Pager from 8am to 5pm)

## 2019-06-04 NOTE — Progress Notes (Signed)
PROGRESS NOTE                                                                                                                                                                                                             Patient Demographics:    Kelli Mcpherson, is a 24 y.o. female, DOB - [birth year], KGM:[id number]  Outpatient Primary MD for the patient is Ranae Pila, MD   Admit date - 05/29/2019   LOS - 6  No chief complaint on file.      Brief Narrative: Patient is a 24 y.o. female with PMHx of gestational diabetes-on Metformin-who presented with DKA and COVID-19 pneumonia.  Hospitalist service consulted for management of these conditions.  Significant Events: 4/27>> Covid positive 5/2>> ED visit for S OB/nausea/vomiting and lower abdominal pain-given IV fluids and discharged home. 5/4>> ED visit with nausea/vomiting-discharged home 5/9>> admit for COVID-19 pneumonia, DKA, hospitalist service consulted.  COVID-19 medications: Steroids: 5/11>>5/12 (betamethasone for OB indication) Remdesivir: 5/9>>5/13  Antibiotics: None  Microbiology data: 5/9: Urine culture: <10,000 colonies/mL insignificant growth, no group B strep isolated  DVT prophylaxis: SQ Lovenox  Procedures: None   Subjective:   Not short of breath-no chest pain-lying comfortably in bed.    Assessment  & Plan :   Acute Hypoxic Resp Failure due to Covid 19 Viral pneumonia: Hypoxia has resolved-her O2 saturations are persistently above 95%.  She has completed a course of remdesivir-since she is not hypoxic-she does not require steroids for COVID-19.   Fever: afebrile O2 requirements:  SpO2: 99 % O2 Flow Rate (L/min): 2 L/min  Prone/Incentive Spirometry: encouraged incentive spirometry use 3-4/hour.  COVID-19 Labs: Recent Labs    06/02/19 0020 06/03/19 0443  CRP 4.5* 1.1*       Component Value Date/Time   BNP 26.1 05/29/2019  1247    Recent Labs  Lab 05/29/19 1247  PROCALCITON 0.23    Lab Results  Component Value Date   SARSCOV2NAA POSITIVE (A) 05/17/2019    DKA/starvation ketoacidosis (A1c 7.3): Resolved-she was kept on IV insulin-as she received betamethasone-but given that her diet remains erratic/noncompliant at times-and the fact that she probably has preeclampsia-and needs tight glycemic control-we will continue with IV insulin.  When she gets closer to discharge or is s/p delivery of baby-we can transition to SQ insulin.  Plan was discussed with OB  MD today.  Hypokalemia: Repleted-on KCl with IV fluids.  Hypomagnesemia: Replete-recheck tomorrow.  Subclinical hyperthyroidism: Prior physician discussed with endocrinology-recommendations are for outpatient follow-up.  Thyrotropin receptor antibody negative.  SVT: Continue telemetry monitoring-appreciate cardiology input.  Echo with preserved EF  Mild transaminitis: Likely secondary to COVID-19-continue to monitor-as could be related to underlying pregnancy related complications.  Has been relatively stable for the past several days-continue to follow periodically.  32 weeks intrauterine pregnancy with probable preeclampsia: Deferred to the primary service  Obesity: Estimated body mass index is 36.84 kg/m as calculated from the following:   Height as of this encounter: 6' (1.829 m).   Weight as of this encounter: 123.2 kg.   ABG:    Component Value Date/Time   HCO3 13.7 (L) 05/29/2019 1746   ACIDBASEDEF 11.2 (H) 05/29/2019 1746   O2SAT 88.5 05/29/2019 1746   Vent Settings: N/A  Condition -  Guarded  Family Communication  : Patient prefers to update family herself-I have asked to let me know if family has any Covid related questions.  Code Status :  Full Code  Diet :  Diet Order            Diet gestational carb mod Room service appropriate? Yes; Fluid consistency: Thin  Diet effective now               Disposition Plan  :   Per  primary service  Antimicorbials  :    Anti-infectives (From admission, onward)   Start     Dose/Rate Route Frequency Ordered Stop   05/30/19 1000  remdesivir 100 mg in sodium chloride 0.9 % 100 mL IVPB  Status:  Discontinued     100 mg 200 mL/hr over 30 Minutes Intravenous Daily 05/29/19 1345 05/29/19 1347   05/30/19 1000  remdesivir 100 mg in sodium chloride 0.9 % 100 mL IVPB     100 mg 200 mL/hr over 30 Minutes Intravenous Daily 05/29/19 1348 06/02/19 1037   05/29/19 1400  remdesivir 200 mg in sodium chloride 0.9% 250 mL IVPB     200 mg 580 mL/hr over 30 Minutes Intravenous Once 05/29/19 1348 05/29/19 1602   05/29/19 1345  remdesivir 200 mg in sodium chloride 0.9% 250 mL IVPB  Status:  Discontinued     200 mg 580 mL/hr over 30 Minutes Intravenous Once 05/29/19 1345 05/29/19 1347      Inpatient Medications  Scheduled Meds: . albuterol  2 puff Inhalation Q6H  . Chlorhexidine Gluconate Cloth  6 each Topical Daily  . clotrimazole  1 Applicatorful Vaginal QHS  . docusate sodium  100 mg Oral Daily  . enoxaparin (LOVENOX) injection  60 mg Subcutaneous Q24H  . famotidine  20 mg Oral BID  . prenatal multivitamin  1 tablet Oral Q1200  . sodium chloride flush  10-40 mL Intracatheter Q12H  . sodium chloride flush  3 mL Intravenous Q12H   Continuous Infusions: . albumin human    . dextrose 5 % and 0.45 % NaCl with KCl 40 mEq/L 100 mL/hr at 06/04/19 0502  . insulin 3 Units/hr (06/04/19 1101)  . magnesium sulfate bolus IVPB     PRN Meds:.[DISCONTINUED] acetaminophen **OR** acetaminophen, acetaminophen, bisacodyl, calcium carbonate, dextrose, menthol-cetylpyridinium, ondansetron **OR** ondansetron (ZOFRAN) IV, phenol, polyethylene glycol, promethazine, sodium chloride flush, triamcinolone cream, zolpidem   Time Spent in minutes  25  See all Orders from today for further details   Jeoffrey Massed M.D on 06/04/2019 at 11:45 AM  To page go to [email or web address] -  use universal  password  [facility] -  Office  [phone]    Objective:   Vitals:   06/04/19 0042 06/04/19 0500 06/04/19 0913 06/04/19 0947  BP: 134/76 (!) 116/55  (!) 141/77  Pulse: (!) 114 90  (!) 106  Resp:  16 17   Temp:  98.7 F (37.1 C) 98.3 F (36.8 C)   TempSrc:  Oral Oral   SpO2:  99%    Weight:      Height:        Wt Readings from Last 3 Encounters:  05/29/19 123.2 kg  05/21/19 (!) 136.5 kg  05/18/19 (!) 136.5 kg     Intake/Output Summary (Last 24 hours) at 06/04/2019 1145 Last data filed at 06/04/2019 1033 Gross per 24 hour  Intake 4841.16 ml  Output 3450 ml  Net 1391.16 ml     Physical Exam Gen Exam:Alert awake-not in any distress HEENT:atraumatic, normocephalic Chest: B/L clear to auscultation anteriorly CVS:S1S2 regular Abdomen:soft non tender, non distended Extremities:no edema Neurology: Non focal Skin: no rash   Data Review:    CBC Recent Labs  Lab 05/30/19 0704 05/30/19 0704 05/31/19 0510 06/01/19 0314 06/02/19 0020 06/03/19 0443 06/04/19 0915  WBC 6.4   < > 7.3 5.6 6.7 6.1 5.4  HGB 11.5*   < > 11.9* 11.7* 11.8* 9.7* 9.8*  HCT 35.4*   < > 36.9 35.5* 36.1 30.7* 30.8*  PLT 233   < > 194 187 204 173 156  MCV 86.1   < > 85.6 84.5 85.3 86.5 87.7  MCH 28.0   < > 27.6 27.9 27.9 27.3 27.9  MCHC 32.5   < > 32.2 33.0 32.7 31.6 31.8  RDW 13.6   < > 13.6 13.4 13.5 13.3 13.9  LYMPHSABS 1.0  --  1.1 1.4 1.7 1.7  --   MONOABS 0.4  --  0.6 0.4 0.4 0.7  --   EOSABS 0.1  --  0.1 0.1 0.1 0.0  --   BASOSABS 0.0  --  0.0 0.0 0.0 0.0  --    < > = values in this interval not displayed.    Chemistries  Recent Labs  Lab 05/29/19 1247 05/29/19 1718 05/30/19 1954 05/30/19 1954 05/31/19 0510 05/31/19 1800 06/01/19 0314 06/01/19 0738 06/02/19 0020 06/02/19 0020 06/03/19 0443 06/03/19 1458 06/03/19 1625 06/03/19 2133 06/04/19 0915  NA 147*   < > 147*   < > 145   < > 146*   < > 140   < > 139 133* 137 135 142  K 4.1   < > 3.4*   < > 3.3*   < >  3.0*   < > 3.1*   < > 2.7* 2.6* 3.2* 3.3* 4.0  CL 123*   < > 122*   < > 118*   < > 115*   < > 111   < > 111 107 111 109 111  CO2 11*   < > 15*   < > 15*   < > 23   < > 20*   < > 20* 20* 19* 17* 23  GLUCOSE 232*   < > 116*   < > 121*   < > 106*   < > 98   < > 89 565* 91 105* 135*  BUN 12   < > 10   < > 9   < > 8   < > 7   < > 5* <5* <5* <5* <5*  CREATININE 0.96   < >  0.87   < > 0.97   < > 0.93   < > 0.86   < > 0.84 0.83 0.85 0.82 0.84  CALCIUM 10.2   < > 10.2   < > 10.0   < > 9.2   < > 9.2   < > 8.6* 7.5* 8.6* 8.7* 9.2  MG 2.0  --  1.7  --  1.7  --   --   --   --   --   --   --  1.6*  --  1.4*  AST 44*   < >  --   --  48*  --  48*  --  53*  --  55*  --  57*  --  56*  ALT 51*   < >  --   --  51*  --  50*  --  53*  --  50*  --  55*  --  57*  ALKPHOS 100   < >  --   --  105  --  101  --  105  --  88  --  91  --  99  BILITOT 1.2   < >  --   --  0.9  --  0.6  --  0.6  --  0.8  --  0.5  --  0.8   < > = values in this interval not displayed.   ------------------------------------------------------------------------------------------------------------------ No results for input(s): CHOL, HDL, LDLCALC, TRIG, CHOLHDL, LDLDIRECT in the last 72 hours.  Lab Results  Component Value Date   HGBA1C 7.3 (H) 05/29/2019   ------------------------------------------------------------------------------------------------------------------ No results for input(s): TSH, T4TOTAL, T3FREE, THYROIDAB in the last 72 hours.  Invalid input(s): FREET3 ------------------------------------------------------------------------------------------------------------------ No results for input(s): VITAMINB12, FOLATE, FERRITIN, TIBC, IRON, RETICCTPCT in the last 72 hours.  Coagulation profile No results for input(s): INR, PROTIME in the last 168 hours.  No results for input(s): DDIMER in the last 72 hours.  Cardiac Enzymes No results for input(s): CKMB, TROPONINI, MYOGLOBIN in the last 168 hours.  Invalid input(s):  CK ------------------------------------------------------------------------------------------------------------------    Component Value Date/Time   BNP 26.1 05/29/2019 1247    Micro Results Recent Results (from the past 240 hour(s))  OB Urine Culture     Status: Abnormal   Collection Time: 05/29/19 12:08 PM   Specimen: OB Clean Catch; Urine  Result Value Ref Range Status   Specimen Description OB CLEAN CATCH  Final   Special Requests NONE  Final   Culture (A)  Final    <10,000 COLONIES/mL INSIGNIFICANT GROWTH NO GROUP B STREP (S.AGALACTIAE) ISOLATED Performed at [facility], [address] [place], [place] [address]    Report Status 05/30/2019 FINAL  Final    Radiology Reports DG Chest Port 1 View  Result Date: 06/01/2019 CLINICAL DATA:  COVID-19 positive 05/18/2019, PICC placement EXAM: PORTABLE CHEST 1 VIEW COMPARISON:  05/31/2019 at 3:53 p.m. FINDINGS: Single frontal view of the chest again demonstrates left-sided PICC extending over a left-sided mediastinum, in a configuration suggesting persistent left-sided SVC. No significant change since prior study. Cardiac silhouette is stable. Multifocal bilateral airspace disease greatest in the left mid and right lower lung zones unchanged. No effusion or pneumothorax. IMPRESSION: 1. Stable position of the left-sided PICC line, suggesting persistent left-sided superior vena cava. Please correlate with catheter function. 2. Stable multifocal pneumonia. Electronically Signed   By: Sharlet Salina M.D.   On: 06/01/2019 01:13   DG CHEST PORT 1 VIEW  Addendum Date: 05/31/2019  ADDENDUM REPORT: 05/31/2019 16:25 ADDENDUM: It should be noted left SVC typically drains into the coronary sinus and subsequently the right atrium as suggested on recent echocardiogram. Current catheter position does not reach the right atrium and is satisfactory for usage. Electronically Signed   By: Alcide Clever M.D.   On: 05/31/2019 16:25   Result Date:  05/31/2019 CLINICAL DATA:  Status post PICC line placement EXAM: PORTABLE CHEST 1 VIEW COMPARISON:  05/29/2019 FINDINGS: Cardiac shadow is again mildly prominent. New left-sided PICC line is seen with the catheter tip presenting over the mid cardiac shadow. Recent echocardiography suggested a left superior vena cava and the catheter position with support this diagnosis. The lungs are well aerated bilaterally with patchy opacities consistent with the given clinical history of COVID-19 positivity. No bony abnormality is seen. IMPRESSION: PICC line placement with the catheter tip projecting over the cardiac shadow to the left of the midline likely within a persistent left superior vena cava. Scattered opacities bilaterally consistent with the given clinical history. Electronically Signed: By: Alcide Clever M.D. On: 05/31/2019 16:12   DG CHEST PORT 1 VIEW  Result Date: 05/29/2019 CLINICAL DATA:  Shortness of breath and cough. COVID-19 positive. EXAM: PORTABLE CHEST 1 VIEW COMPARISON:  May 22, 2019 FINDINGS: Cardiomediastinal silhouette is mildly enlarged. Interval development of patchy bilateral airspace opacities with lower lobe predominance. Osseous structures are without acute abnormality. Soft tissues are grossly normal. IMPRESSION: 1. Interval development of patchy bilateral airspace opacities with lower lobe predominance consistent with atypical pneumonia. 2. Enlarged cardiac silhouette. Electronically Signed   By: Ted Mcalpine M.D.   On: 05/29/2019 12:13   DG Chest Port 1 View  Result Date: 05/22/2019 CLINICAL DATA:  Shortness of breath.  COVID-19. EXAM: PORTABLE CHEST 1 VIEW COMPARISON:  08/07/2018 FINDINGS: The heart size and mediastinal contours are within normal limits. Both lungs are clear. The visualized skeletal structures are unremarkable. IMPRESSION: Clear lungs. Electronically Signed   By: Deatra Robinson M.D.   On: 05/22/2019 02:41   [place] MFM FETAL BPP WO NON STRESS  Result Date:  06/03/2019 ----------------------------------------------------------------------  OBSTETRICS REPORT                       (Signed Final 06/03/2019 02:33 pm) ---------------------------------------------------------------------- Patient Info  ID #:       [id number]                          D.O.B.:  03-May-1995 (23 yrs)  Name:       Kelli Mcpherson              Visit Date: 06/03/2019 07:09 am ---------------------------------------------------------------------- Performed By  Attending:        Ma Rings MD         Ref. Address:     [facility]  [address]                                                             [place]                                                             [id number]  Performed By:     Tommie Raymond BS,       Secondary Phy.:   [facility]                    RDMS, RVT                                                             Care  Referred By:      Candice Camp MD          [facility] ---------------------------------------------------------------------- Orders  #  Description                           Code        Ordered By  1  [place] MFM FETAL BPP WO NON               60454.09    DAVID LOWE     STRESS ----------------------------------------------------------------------  #  Order #                     Accession #                Episode #  1  [id number]                   [id number]                 [id number] ---------------------------------------------------------------------- Indications  Gestational diabetes in pregnancy,             O24.415  controlled by oral hypoglycemic drugs, DKA  Oligohydraminios, third trimester, unspecified O41.03X0  [redacted] weeks gestation of pregnancy                Z3A.32  Obesity complicating pregnancy, third          O99.213  trimester  Unspecified maternal hypertension, third       O16.3  trimester  COVID19 (+)  (4/27)  Encounter for other antenatal screening        Z36.2  follow-up ---------------------------------------------------------------------- Fetal Evaluation  Num Of Fetuses:         1  Fetal Heart Rate(bpm):  152  Cardiac Activity:       Observed  Presentation:           Cephalic  Placenta:               Anterior  P. Cord Insertion:      Not well visualized  Amniotic Fluid  AFI FV:      Subjectively low-normal  AFI Sum(cm)     %Tile       Largest Pocket(cm)  9.6             14          3.2  RUQ(cm)       RLQ(cm)       LUQ(cm)        LLQ(cm)  3.2           2.7           1.9            1.8 ---------------------------------------------------------------------- Biophysical Evaluation  Amniotic F.V:   Pocket => 2 cm             F. Tone:        Observed  F. Movement:    Observed                   Score:          8/8  F. Breathing:   Observed ---------------------------------------------------------------------- OB History  Gravidity:    1 ---------------------------------------------------------------------- Gestational Age  LMP:           32w 4d        Date:  10/18/18                 EDD:   07/25/19  Best:          Armida Sans 4d     Det. By:  LMP  (10/18/18)          EDD:   07/25/19 ---------------------------------------------------------------------- Cervix Uterus Adnexa  Cervix  Not visualized (advanced GA >24wks)  Uterus  No abnormality visualized.  Right Ovary  Within normal limits. No adnexal mass visualized.  Left Ovary  Within normal limits. No adnexal mass visualized.  Cul De Sac  No free fluid seen.  Adnexa  No abnormality visualized. ---------------------------------------------------------------------- Comments  This patient has been hospitalized due to diabetic  ketoacidosis which is now resolved.  She has also been  diagnosed with preeclampsia based on her elevated  blood  pressures and 24-hour urine results showing 1.2 g of protein.  Her blood pressures remain in the 130s to 140s over 80s to  90s range.  Oligohydramnios had been noted on her prenatal  ultrasounds earlier this week.  The patient has already  received a complete course of antenatal corticosteroids.  A biophysical profile performed today was 8 out of 8.  The  amniotic fluid level has increased to 9.2 cm now that she is  better hydrated.  Due to preeclampsia with mildly elevated liver function tests  in the 50s range, I would recommend that she continue  inpatient management  until delivery.  The patient's liver function tests may not be increasing due to  the effects of the antenatal corticosteroids she recently  received.  I anticipate that her lab work may become more  abnormal once the steroid effects go away.  Usually,  expectant management of severe preeclampsia is successful  in delaying delivery between 5 to 7 days.  She should  continue to be followed with daily PIH labs.  I would recommend delivery should her liver function tests  continue to increase, should her platelet levels decrease, or  should her blood pressures be persistently greater than 150/  100s. ----------------------------------------------------------------------                   Ma Rings, MD Electronically Signed Final Report   06/03/2019 02:33 pm ----------------------------------------------------------------------  [place] MFM FETAL BPP WO NON STRESS  Result Date: 05/31/2019 ----------------------------------------------------------------------  OBSTETRICS REPORT                       (Signed Final 05/31/2019 06:01 pm) ---------------------------------------------------------------------- Patient Info  ID #:       [id number]                          D.O.B.:  04/05/1995 (23 yrs)  Name:       Kelli Mcpherson              Visit Date: 05/31/2019 09:27 am ---------------------------------------------------------------------- Performed By   Attending:        Ma Rings MD         Ref. Address:     [facility]                                                             [address]                                                             [place]                                                             [phone]  Performed By:     Ramond Craver  Tester BS,       Secondary Phy.:   [facility]                    RDMS, RVT                                                             Care  Referred By:      Candice Camp MD          [facility] ---------------------------------------------------------------------- Orders  #  Description                           Code        Ordered By  1  [place] MFM OB DETAIL +14 WK               76811.01    MEGAN MORRIS  2  [place] MFM UA CORD DOPPLER                76820.02    MEGAN MORRIS  3  [place] MFM FETAL BPP WO NON               76819.01    MEGAN MORRIS     STRESS ----------------------------------------------------------------------  #  Order #                     Accession #                Episode #  1  [id number]                   [id number]                 [id number]  2  [id number]                   [id number]                 [id number]  3  [id number]                   [id number]                 [id number] ---------------------------------------------------------------------- Indications  Gestational diabetes in pregnancy,             O24.415  controlled by oral hypoglycemic drugs, DKA  Oligohydraminios, third trimester, unspecified O41.03X0  [redacted] weeks gestation of pregnancy                Z3A.32  Obesity complicating pregnancy, third          O99.213  trimester  Unspecified maternal hypertension, third       O16.3  trimester  COVID19 (+)  (4/27)  Encounter for antenatal screening for          Z36.3   malformations  Encounter for other antenatal screening        Z36.2  follow-up ---------------------------------------------------------------------- Fetal Evaluation  Num Of Fetuses:         1  Fetal Heart Rate(bpm):  142  Cardiac Activity:       Observed  Presentation:           Cephalic  Placenta:               Anterior  P. Cord Insertion:      Not well visualized  Amniotic Fluid  AFI FV:      Oligohydramnios  AFI Sum(cm)     %Tile       Largest Pocket(cm)  5.3             < 3         2.6  RUQ(cm)       RLQ(cm)       LUQ(cm)        LLQ(cm)  1.8           0             0.9            2.6 ---------------------------------------------------------------------- Biophysical Evaluation  Amniotic F.V:   Pocket => 2 cm             F. Tone:        Not Observed  F. Movement:    Observed                   Score:          6/8  F. Breathing:   Observed ---------------------------------------------------------------------- Biometry  BPD:      82.8  mm     G. Age:  33w 2d         76  %    CI:        83.92   %    70 - 86                                                          FL/HC:      21.8   %    19.1 - 21.3  HC:      284.9  mm     G. Age:  31w 2d        4.3  %    HC/AC:      0.98        0.96 - 1.17  AC:       292   mm     G. Age:  33w 1d         79  %    FL/BPD:     75.1   %    71 - 87  FL:       62.2  mm     G. Age:  32w 2d         39  %    FL/AC:      21.3   %    20 - 24  HUM:        57  mm     G. Age:  33w 1d         71  %  CER:      41.3  mm  G. Age:  35w 2d         87  %  LV:        4.5  mm  Est. FW:    2036  gm      4 lb 8 oz     58  % ---------------------------------------------------------------------- OB History  Gravidity:    1 ---------------------------------------------------------------------- Gestational Age  LMP:           32w 1d        Date:  10/18/18                 EDD:   07/25/19  U/S Today:     32w 4d                                        EDD:   07/22/19  Best:          32w 1d     Det. By:  LMP   (10/18/18)          EDD:   07/25/19 ---------------------------------------------------------------------- Anatomy  Cranium:               Not well visualized    LVOT:                   Appears normal  Cavum:                 Not well visualized    Aortic Arch:            Not well visualized  Ventricles:            Appears normal         Ductal Arch:            Appears normal  Choroid Plexus:        Appears normal         Diaphragm:              Appears normal  Cerebellum:            Not well visualized    Stomach:                Appears normal, left                                                                        sided  Posterior Fossa:       Not well visualized    Abdomen:                Appears normal  Nuchal Fold:           Not applicable (>20    Abdominal Wall:         Not well visualized                         wks GA)  Face:                  Orbits nl; profile not Cord Vessels:           Appears normal (3  well visualized                                vessel cord)  Lips:                  Not well visualized    Kidneys:                Appear normal  Palate:                Not well visualized    Bladder:                Appears normal  Thoracic:              Appears normal         Spine:                  Ltd views no                                                                        intracranial signs of                                                                        NTD  Heart:                 Appears normal         Upper Extremities:      Visualized                         (4CH, axis, and                         situs)  RVOT:                  Appears normal         Lower Extremities:      Visualized  Other:  Technically difficult due to maternal habitus and fetal position.          Technically difficult due to low amniotic fluid. ---------------------------------------------------------------------- Doppler - Fetal Vessels  Umbilical Artery   S/D     %tile      RI                PI                     ADFV    RDFV   2.96       67    0.66             0.88                        No      No ---------------------------------------------------------------------- Cervix Uterus Adnexa  Cervix  Not visualized (advanced GA >24wks)  Uterus  No  abnormality visualized. ---------------------------------------------------------------------- Comments  This patient has been hospitalized due to diabetic  ketoacidosis and possible preeclampsia.  Her blood  pressures remain in the 120s to 150s over 80s to 90s range.  Her blood glucose levels are now in the normal range and her  diabetic ketoacidosis is resolving.  On today's exam, the overall fetal growth was appropriate for  her gestational age.  Oligohydramnios continues to be noted  today.  Doppler studies of the umbilical arteries performed today  shows a normal S/D ratio.  There were no signs of absent or  reversed end-diastolic flow.  A biophysical profile performed today was 6 out of 8.  She  received a -2 for absent fetal tone.  Her 24-hour urine results are currently pending.  Based on  her elevated blood pressures and her elevated P/C ratio, she  probably is developing preeclampsia.  Due to probable preeclampsia at her current gestational age,  now that her diabetic ketoacidosis is resolving, I would  recommend that she receive a complete course of antenatal  corticosteroids.  She should be kept on IV insulin while she is  receiving her steroid course.  She should have another ultrasound later this week to  reassess the amniotic fluid levels. ----------------------------------------------------------------------                   Ma Rings, MD Electronically Signed Final Report   05/31/2019 06:01 pm ----------------------------------------------------------------------  [place] MFM FETAL BPP WO NON STRESS  Result Date: 05/30/2019 ----------------------------------------------------------------------  OBSTETRICS REPORT                        (Signed Final 05/30/2019 05:11 pm) ---------------------------------------------------------------------- Patient Info  ID #:       [id number]                          D.O.B.:  Jun 15, 1995 (23 yrs)  Name:       Kelli Mcpherson              Visit Date: 05/30/2019 09:39 am ---------------------------------------------------------------------- Performed By  Attending:        Ma Rings MD         Ref. Address:     [facility]                                                             [address]  Jacky Kindle                                                             [phone]  Performed By:     Tommie Raymond BS,       Secondary Phy.:   [facility]                    RDMS, RVT                                                             Care  Referred By:      Candice Camp MD          [facility] ---------------------------------------------------------------------- Orders  #  Description                           Code        Ordered By  1  [place] MFM FETAL BPP WO NON               90240.97    MEGAN MORRIS     STRESS ----------------------------------------------------------------------  #  Order #                     Accession #                Episode #  1  [id number]                   [id number]                 [id number] ---------------------------------------------------------------------- Indications  Oligohydraminios, third trimester, unspecified O41.03X0  Gestational diabetes in pregnancy,             O24.415  controlled by oral hypoglycemic drugs, DKA  Obesity complicating pregnancy, third          O99.213  trimester  Unspecified maternal hypertension, third       O16.3  trimester  COVID19 (+)  (4/27)  [redacted] weeks gestation of  pregnancy                Z3A.31 ---------------------------------------------------------------------- Fetal Evaluation  Num Of Fetuses:         1  Fetal Heart Rate(bpm):  166  Cardiac Activity:       Observed  Presentation:           Cephalic  Placenta:               Anterior  P. Cord Insertion:      Not well visualized  Amniotic Fluid  AFI FV:      Oligohydramnios  AFI Sum(cm)     %Tile       Largest Pocket(cm)  2.2             < 3         1.1  RUQ(cm)       RLQ(cm)       LUQ(cm)        LLQ(cm)  1.1           0             1.1            0 ---------------------------------------------------------------------- Biophysical Evaluation  Amniotic F.V:   Oligohydramnios            F. Tone:        Observed  F. Movement:    Not Observed               Score:          4/8  F. Breathing:   Observed ---------------------------------------------------------------------- OB History  Gravidity:    1 ---------------------------------------------------------------------- Gestational Age  LMP:           32w 0d        Date:  10/18/18                 EDD:   07/25/19  Best:          Armida Sans 0d     Det. By:  LMP  (10/18/18)          EDD:   07/25/19 ---------------------------------------------------------------------- Anatomy  Diaphragm:             Appears normal         Kidneys:                Appear normal  Stomach:               Appears normal, left   Bladder:                Appears normal                         sided  Other:  Technically difficult due to low amniotic fluid. Technically difficult due          to maternal habitus and fetal position. ---------------------------------------------------------------------- Cervix Uterus Adnexa  Cervix  Not visualized (advanced GA >24wks)  Uterus  No abnormality visualized.  Right Ovary  Within normal limits. No adnexal mass visualized.  Left Ovary  Within normal limits. No adnexal mass visualized.  Cul De Sac  No free fluid seen.  Adnexa  No abnormality visualized.  ---------------------------------------------------------------------- Comments  This patient has been hospitalized due to diabetic  ketoacidosis and persistent nausea and vomiting.  She also  tested positive for COVID-19 about 2 weeks ago.  She is  currently being treated with remdesivir for Covid pneumonia.  She has been receiving IV hydration and IV insulin to help  resolve the diabetic ketoacidosis.  This morning, her fetal heart rate tracing has improved.  There are periods of variability and accelerations.  No further  decelerations are noted.  The patient is also more alert and  awake.  Her labs show hypernatremia with a sodium level of 150,  serum creatinine of 1.03, and AST and ALT that are mildly  elevated (51).  Her fingerstick values are now in the 110s to  120s range.  Her blood pressures remain in the 140s over  80s range.  A biophysical profile performed today was 4 out of 8.  The  patient received -2 for absent fetal movements and also -2 for  anhydramnios.  I reviewed the patient's ultrasound images from yesterday.  A  >2 cm pocket of amniotic fluid was noted yesterday.  Due to her symptoms of persistent nausea and vomiting, her  elevated blood pressures, her elevated liver function tests,  and her elevated serum creatinine level, a diagnosis of  preeclampsia has not been ruled out.  A P/C ratio performed this afternoon was 0.86 (elevated).  A  24-hour urine collection is underway to assess the total  protein.  A course of antenatal corticosteroids for fetal lung maturity  has been held due to concerns that it may elevate her blood  glucose levels and further worsen her diabetic ketoacidosis.  The patient should be kept on continuous monitoring  overnight.  I would recommend delivery should the fetal  status be nonreassuring.  Should her diabetic ketoacidosis continue to resolve and her  blood pressures remain elevated therefore increasing the  suspicion for preeclampsia, a course of antenatal   corticosteroids should be administered tomorrow morning.  The patient will continue to receive IV hydration overnight.  We will repeat another ultrasound tomorrow to assess the  amniotic fluid levels, the fetal growth, and umbilical artery  Doppler studies. ----------------------------------------------------------------------                   Ma Rings, MD Electronically Signed Final Report   05/30/2019 05:11 pm ----------------------------------------------------------------------  [place] MFM FETAL BPP WO NON STRESS  Result Date: 05/29/2019 ----------------------------------------------------------------------  OBSTETRICS REPORT                       (Signed Final 05/29/2019 12:15 pm) ---------------------------------------------------------------------- Patient Info  ID #:       [id number]                          D.O.B.:  1995-04-10 (23 yrs)  Name:       Kelli Mcpherson              Visit Date: 05/29/2019 09:58 am ---------------------------------------------------------------------- Performed By  Attending:        Lin Landsman      Ref. Address:      [facility]                                                              [address]  [address]                                                              [place]                                                              [id number]  Performed By:     Birdena Crandall        [facility]  Referred By:      Candice Camp MD ---------------------------------------------------------------------- Orders  #  Description                           Code        Ordered By  1  [place] MFM FETAL BPP WO NON               60454.09    DAVID LOWE     STRESS ----------------------------------------------------------------------  #   Order #                     Accession #                Episode #  1  [id number]                   [id number]                 [id number] ---------------------------------------------------------------------- Indications  Gestational diabetes in pregnancy,              O24.415  controlled by oral hypoglycemic drugs, DKA  Obesity complicating pregnancy, third           O99.213  trimester  Unspecified maternal hypertension, third        O16.3  trimester  COVID19 (+)  (4/27)  Oligohydraminios, third trimester, unspecified  O41.03X0  [redacted] weeks gestation of pregnancy                 Z3A.31 ---------------------------------------------------------------------- Fetal Evaluation  Num Of Fetuses:          1  Fetal Heart Rate(bpm):   150  Cardiac Activity:        Observed  Presentation:            Cephalic  Placenta:                Anterior  Amniotic Fluid  AFI FV:      Within normal limits  AFI Sum(cm)     %Tile       Largest Pocket(cm)  9.5             12          2.8  RUQ(cm)       RLQ(cm)       LUQ(cm)        LLQ(cm)  2.8           2.5           2.8            1.4 ---------------------------------------------------------------------- Biophysical Evaluation  Amniotic F.V:   Within normal limits       F. Tone:         Observed  F. Movement:    Not Observed               Score:           4/8  F. Breathing:   Not Observed ---------------------------------------------------------------------- OB History  Gravidity:    1 ---------------------------------------------------------------------- Gestational Age  LMP:           31w 6d        Date:  10/18/18                 EDD:   07/25/19  Best:          31w 6d     Det. By:  LMP  (10/18/18)          EDD:   07/25/19 ---------------------------------------------------------------------- Anatomy  Stomach:               Appears normal, left   Kidneys:                Appear normal                         sided  Abdominal Wall:        Appears nml (cord      Bladder:                Appears  normal                         insert, abd wall) ---------------------------------------------------------------------- Impression  Limited exam due to presumed maternal DKA  Biophysical profile 4 out of 8 there is some movement but not  enough to pass the BPP.  Labs are pending with continued fetal monitoring.  I discussed with Dr. Rana Snare the current plan and agree. ---------------------------------------------------------------------- Recommendations  Continueed Fetal monitoring  Hold Betamethsone  DKA protocol including insulin drip and fluid hydration with  the goal of blood sugars 100-140's.  Will await labs to perform consultation and diagnosis of DKA.  Continue COVID protocol. ----------------------------------------------------------------------               Lin Landsman, MD Electronically Signed Final Report   05/29/2019 12:15 pm ----------------------------------------------------------------------  [place] MFM OB DETAIL +14 WK  Result Date: 05/31/2019 ----------------------------------------------------------------------  OBSTETRICS REPORT                       (Signed Final 05/31/2019 06:01 pm) ---------------------------------------------------------------------- Patient Info  ID #:       [id number]                          D.O.B.:  Oct 17, 1995 (23 yrs)  Name:       Kelli Mcpherson              Visit Date: 05/31/2019 09:27 am ---------------------------------------------------------------------- Performed By  Attending:        Ma Rings MD  Ref. Address:     [facility]                                                             [address]                                                             [place]                                                             [phone]  Performed By:     Tommie Raymond BS,       Secondary Phy.:   [facility]                     RDMS, RVT                                                             Care  Referred By:      Candice Camp MD          [facility] ---------------------------------------------------------------------- Orders  #  Description                           Code        Ordered By  1  [place] MFM OB DETAIL +14 WK               76811.01    MEGAN MORRIS  2  [place] MFM UA CORD DOPPLER  66294.76    MEGAN MORRIS  3  [place] MFM FETAL BPP WO NON               [id number]    MEGAN MORRIS     STRESS ----------------------------------------------------------------------  #  Order #                     Accession #                Episode #  1  [id number]                   [id number]                 [id number]  2  [id number]                   [id number]                 [id number]  3  [id number]                   [id number]                 [id number] ---------------------------------------------------------------------- Indications  Gestational diabetes in pregnancy,             O24.415  controlled by oral hypoglycemic drugs, DKA  Oligohydraminios, third trimester, unspecified O41.03X0  [redacted] weeks gestation of pregnancy                A7G.81  Obesity complicating pregnancy, third          O99.213  trimester  Unspecified maternal hypertension, third       O16.3  trimester  COVID19 (+)  (4/27)  Encounter for antenatal screening for          Z36.3  malformations  Encounter for other antenatal screening        Z36.2  follow-up ---------------------------------------------------------------------- Fetal Evaluation  Num Of Fetuses:         1  Fetal Heart Rate(bpm):  142  Cardiac Activity:       Observed  Presentation:           Cephalic  Placenta:               Anterior  P. Cord Insertion:      Not well visualized  Amniotic Fluid  AFI FV:      Oligohydramnios  AFI Sum(cm)     %Tile       Largest Pocket(cm)  5.3             < 3         2.6   RUQ(cm)       RLQ(cm)       LUQ(cm)        LLQ(cm)  1.8           0             0.9            2.6 ---------------------------------------------------------------------- Biophysical Evaluation  Amniotic F.V:   Pocket => 2 cm             F. Tone:        Not Observed  F. Movement:    Observed                   Score:          6/8  F. Breathing:   Observed ---------------------------------------------------------------------- Biometry  BPD:      82.8  mm     G. Age:  33w 2d         76  %    CI:        83.92   %    70 - 86                                                          FL/HC:      21.8   %    19.1 - 21.3  HC:      284.9  mm     G. Age:  31w 2d        4.3  %    HC/AC:      0.98        0.96 - 1.17  AC:       292   mm     G. Age:  33w 1d         79  %    FL/BPD:     75.1   %    71 - 87  FL:       62.2  mm     G. Age:  32w 2d         39  %    FL/AC:      21.3   %    20 - 24  HUM:        57  mm     G. Age:  33w 1d         71  %  CER:      41.3  mm     G. Age:  35w 2d         87  %  LV:        4.5  mm  Est. FW:    2036  gm      4 lb 8 oz     58  % ---------------------------------------------------------------------- OB History  Gravidity:    1 ---------------------------------------------------------------------- Gestational Age  LMP:           32w 1d        Date:  10/18/18                 EDD:   07/25/19  U/S Today:     32w 4d                                        EDD:   07/22/19  Best:          32w 1d     Det. By:  LMP  (10/18/18)          EDD:   07/25/19 ---------------------------------------------------------------------- Anatomy  Cranium:               Not well visualized    LVOT:                   Appears normal  Cavum:                 Not well visualized    Aortic Arch:            Not well visualized  Ventricles:            Appears normal         Ductal Arch:            Appears normal  Choroid Plexus:        Appears normal         Diaphragm:              Appears normal  Cerebellum:            Not well  visualized    Stomach:                Appears normal, left                                                                        sided  Posterior Fossa:       Not well visualized    Abdomen:                Appears normal  Nuchal Fold:           Not applicable (>20    Abdominal Wall:         Not well visualized                         wks GA)  Face:                  Orbits nl; profile not Cord Vessels:           Appears normal (3                         well visualized                                vessel cord)  Lips:                  Not well visualized    Kidneys:                Appear normal  Palate:                Not well visualized    Bladder:                Appears normal  Thoracic:              Appears normal         Spine:                  Ltd views no                                                                        intracranial signs of  NTD  Heart:                 Appears normal         Upper Extremities:      Visualized                         (4CH, axis, and                         situs)  RVOT:                  Appears normal         Lower Extremities:      Visualized  Other:  Technically difficult due to maternal habitus and fetal position.          Technically difficult due to low amniotic fluid. ---------------------------------------------------------------------- Doppler - Fetal Vessels  Umbilical Artery   S/D     %tile      RI               PI                     ADFV    RDFV   2.96       67    0.66             0.88                        No      No ---------------------------------------------------------------------- Cervix Uterus Adnexa  Cervix  Not visualized (advanced GA >24wks)  Uterus  No abnormality visualized. ---------------------------------------------------------------------- Comments  This patient has been hospitalized due to diabetic  ketoacidosis and possible preeclampsia.  Her blood  pressures remain in  the 120s to 150s over 80s to 90s range.  Her blood glucose levels are now in the normal range and her  diabetic ketoacidosis is resolving.  On today's exam, the overall fetal growth was appropriate for  her gestational age.  Oligohydramnios continues to be noted  today.  Doppler studies of the umbilical arteries performed today  shows a normal S/D ratio.  There were no signs of absent or  reversed end-diastolic flow.  A biophysical profile performed today was 6 out of 8.  She  received a -2 for absent fetal tone.  Her 24-hour urine results are currently pending.  Based on  her elevated blood pressures and her elevated P/C ratio, she  probably is developing preeclampsia.  Due to probable preeclampsia at her current gestational age,  now that her diabetic ketoacidosis is resolving, I would  recommend that she receive a complete course of antenatal  corticosteroids.  She should be kept on IV insulin while she is  receiving her steroid course.  She should have another ultrasound later this week to  reassess the amniotic fluid levels. ----------------------------------------------------------------------                   Ma Rings, MD Electronically Signed Final Report   05/31/2019 06:01 pm ----------------------------------------------------------------------  ECHOCARDIOGRAM COMPLETE  Result Date: 05/31/2019    ECHOCARDIOGRAM REPORT   Patient Name:   Kelli Mcpherson Date of Exam: 05/31/2019 Medical Rec #:  [id number]          Height:       72.0 in Accession #:    [id number]         Weight:  271.6 lb Date of Birth:  1995-02-06          BSA:          2.427 m Patient Age:    23 years           BP:           133/68 mmHg Patient Gender: F                  HR:           109 bpm. Exam Location:  Inpatient Procedure: 2D Echo, Color Doppler and Cardiac Doppler Indications:    I47.1 SVT  History:        Patient has no prior history of Echocardiogram examinations.                 COVID+ 05/17/19                 [redacted]  Weeks Pregnant at time of study.  Sonographer:    Irving Burton Senior RDCS Referring Phys: [id number] DAWOOD S Randol Kern  Sonographer Comments: Technically difficult due to patient body habitus. IMPRESSIONS  1. Left ventricular ejection fraction, by estimation, is 50 to 55%. The left ventricle has low normal function. The left ventricle demonstrates global hypokinesis. Indeterminate diastolic filling due to E-A fusion. There is abnormal (paradoxical) septal  motion, consistent with right ventricular volume overload.  2. Right ventricular systolic function is normal. The right ventricular size is normal. Tricuspid regurgitation signal is inadequate for assessing PA pressure.  3. 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 The inferior vena cava is also dilated, but the right heart chambers are not enlarged.  4. The mitral valve is normal in structure. No evidence of mitral valve regurgitation. No evidence of mitral stenosis.  5. The aortic valve is normal in structure. Aortic valve regurgitation is not visualized. No aortic stenosis is present.  6. The inferior vena cava is dilated in size with <50% respiratory variability, suggesting right atrial pressure of 15 mmHg. Comparison(s): No prior Echocardiogram. Conclusion(s)/Recommendation(s): Recommend a saline contrast injection from a vein in the left upper extremity, with imaging of the coronary sinus. FINDINGS  Left Ventricle: Left ventricular ejection fraction, by estimation, is 50 to 55%. The left ventricle has low normal function. The left ventricle demonstrates global hypokinesis. The left ventricular internal cavity size was normal in size. There is no left ventricular hypertrophy. Abnormal (paradoxical) septal motion, consistent with right ventricular volume overload. Indeterminate diastolic filling due to E-A fusion. Right Ventricle: The right ventricular size is normal. No increase in right ventricular wall thickness. Right  ventricular systolic function is normal. Tricuspid regurgitation signal is inadequate for assessing PA pressure. Left Atrium: Left atrial size was normal in size. 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 The inferior vena cava is also dilated, but the right heart chambers are not enlarged. Right atrial size was normal in size. Pericardium: There is no evidence of pericardial effusion. Mitral Valve: The mitral valve is normal in structure. Normal mobility of the mitral valve leaflets. No evidence of mitral valve regurgitation. No evidence of mitral valve stenosis. Tricuspid Valve: The tricuspid valve is normal in structure. Tricuspid valve regurgitation is not demonstrated. No evidence of tricuspid stenosis. Aortic Valve: The aortic valve is normal in structure. Aortic valve regurgitation is not visualized. No aortic stenosis is present. Pulmonic Valve: The pulmonic valve was normal in structure. Pulmonic valve regurgitation is  not visualized. No evidence of pulmonic stenosis. Aorta: The aortic root is normal in size and structure. Venous: The inferior vena cava is dilated in size with less than 50% respiratory variability, suggesting right atrial pressure of 15 mmHg. IAS/Shunts: No atrial level shunt detected by color flow Doppler.  LEFT VENTRICLE PLAX 2D LVIDd:         5.00 cm LVIDs:         3.80 cm LV PW:         1.10 cm LV IVS:        1.40 cm LVOT diam:     2.00 cm LV SV:         56 LV SV Index:   23 LVOT Area:     3.14 cm  RIGHT VENTRICLE RV S prime:     12.20 cm/s TAPSE (M-mode): 1.8 cm LEFT ATRIUM           Index       RIGHT ATRIUM           Index LA diam:      3.20 cm 1.32 cm/m  RA Area:     17.60 cm LA Vol (A2C): 45.3 ml 18.67 ml/m RA Volume:   51.10 ml  21.06 ml/m LA Vol (A4C): 47.7 ml 19.66 ml/m  AORTIC VALVE LVOT Vmax:   104.00 cm/s LVOT Vmean:  79.700 cm/s LVOT VTI:    0.179 m  AORTA Ao Root diam: 3.30 cm Ao Asc diam:  2.80 cm  SHUNTS  Systemic VTI:  0.18 m Systemic Diam: 2.00 cm Thurmon FairMihai Croitoru MD Electronically signed by Thurmon FairMihai Croitoru MD Signature Date/Time: 05/31/2019/9:09:24 AM    Final    US EKG SITE RITE  Result Date: 05/31/2019 If Site Rite image not attached, placement could not be confirmed due to current cardiac rhythm.  US MFM UA CORD DOPPLER  Result Date: 05/31/2019 ----------------------------------------------------------------------  OBSTETRICS REPORT                       (Signed Final 05/31/2019 06:01 pm) ---------------------------------------------------------------------- Patient Info  ID #:       [id number]                          D.O.B.:  02-24-1995 (23 yrs)  Name:       Kelli Mcpherson              Visit Date: 05/31/2019 09:27 am ---------------------------------------------------------------------- Performed By  Attending:        Ma RingsVictor Fang MD         Ref. Address:     [facility]                                                             [address]                                                             [place]                                                             [phone]  Performed By:     Tommie Raymond BS,       Secondary Phy.:   [facility]                    RDMS, RVT                                                             Care  Referred By:      Candice Camp MD          [facility] ---------------------------------------------------------------------- Orders  #  Description                           Code        Ordered By  1  [place] MFM OB DETAIL +14 WK               76811.01    MEGAN MORRIS  2  [place] MFM UA CORD DOPPLER                76820.02    MEGAN MORRIS  3  [place] MFM FETAL BPP WO NON               76819.01    MEGAN MORRIS     STRESS  ----------------------------------------------------------------------  #  Order #                     Accession #                Episode #  1  [id number]                   [id number]                 [id number]  2  [id number]                   [id number]  3  [id number]  [id number]                 [id number] ---------------------------------------------------------------------- Indications  Gestational diabetes in pregnancy,             O24.415  controlled by oral hypoglycemic drugs, DKA  Oligohydraminios, third trimester, unspecified O41.03X0  [redacted] weeks gestation of pregnancy                Z3A.32  Obesity complicating pregnancy, third          O99.213  trimester  Unspecified maternal hypertension, third       O16.3  trimester  COVID19 (+)  (4/27)  Encounter for antenatal screening for          Z36.3  malformations  Encounter for other antenatal screening        Z36.2  follow-up ---------------------------------------------------------------------- Fetal Evaluation  Num Of Fetuses:         1  Fetal Heart Rate(bpm):  142  Cardiac Activity:       Observed  Presentation:           Cephalic  Placenta:               Anterior  P. Cord Insertion:      Not well visualized  Amniotic Fluid  AFI FV:      Oligohydramnios  AFI Sum(cm)     %Tile       Largest Pocket(cm)  5.3             < 3         2.6  RUQ(cm)       RLQ(cm)       LUQ(cm)        LLQ(cm)  1.8           0             0.9            2.6 ---------------------------------------------------------------------- Biophysical Evaluation  Amniotic F.V:   Pocket => 2 cm             F. Tone:        Not Observed  F. Movement:    Observed                   Score:          6/8  F. Breathing:   Observed ---------------------------------------------------------------------- Biometry  BPD:      82.8  mm     G. Age:  33w 2d         76  %    CI:        83.92   %    70 - 86                                                          FL/HC:      21.8    %    19.1 - 21.3  HC:      284.9  mm     G. Age:  31w 2d        4.3  %    HC/AC:      0.98        0.96 - 1.17  AC:       292   mm  G. Age:  33w 1d         79  %    FL/BPD:     75.1   %    71 - 87  FL:       62.2  mm     G. Age:  32w 2d         39  %    FL/AC:      21.3   %    20 - 24  HUM:        57  mm     G. Age:  33w 1d         71  %  CER:      41.3  mm     G. Age:  35w 2d         87  %  LV:        4.5  mm  Est. FW:    2036  gm      4 lb 8 oz     58  % ---------------------------------------------------------------------- OB History  Gravidity:    1 ---------------------------------------------------------------------- Gestational Age  LMP:           32w 1d        Date:  10/18/18                 EDD:   07/25/19  U/S Today:     32w 4d                                        EDD:   07/22/19  Best:          32w 1d     Det. By:  LMP  (10/18/18)          EDD:   07/25/19 ---------------------------------------------------------------------- Anatomy  Cranium:               Not well visualized    LVOT:                   Appears normal  Cavum:                 Not well visualized    Aortic Arch:            Not well visualized  Ventricles:            Appears normal         Ductal Arch:            Appears normal  Choroid Plexus:        Appears normal         Diaphragm:              Appears normal  Cerebellum:            Not well visualized    Stomach:                Appears normal, left                                                                        sided  Posterior Fossa:  Not well visualized    Abdomen:                Appears normal  Nuchal Fold:           Not applicable (>20    Abdominal Wall:         Not well visualized                         wks GA)  Face:                  Orbits nl; profile not Cord Vessels:           Appears normal (3                         well visualized                                vessel cord)  Lips:                  Not well visualized    Kidneys:                Appear normal   Palate:                Not well visualized    Bladder:                Appears normal  Thoracic:              Appears normal         Spine:                  Ltd views no                                                                        intracranial signs of                                                                        NTD  Heart:                 Appears normal         Upper Extremities:      Visualized                         (4CH, axis, and                         situs)  RVOT:                  Appears normal         Lower Extremities:      Visualized  Other:  Technically difficult due to maternal habitus and fetal position.  Technically difficult due to low amniotic fluid. ---------------------------------------------------------------------- Doppler - Fetal Vessels  Umbilical Artery   S/D     %tile      RI               PI                     ADFV    RDFV   2.96       67    0.66             0.88                        No      No ---------------------------------------------------------------------- Cervix Uterus Adnexa  Cervix  Not visualized (advanced GA >24wks)  Uterus  No abnormality visualized. ---------------------------------------------------------------------- Comments  This patient has been hospitalized due to diabetic  ketoacidosis and possible preeclampsia.  Her blood  pressures remain in the 120s to 150s over 80s to 90s range.  Her blood glucose levels are now in the normal range and her  diabetic ketoacidosis is resolving.  On today's exam, the overall fetal growth was appropriate for  her gestational age.  Oligohydramnios continues to be noted  today.  Doppler studies of the umbilical arteries performed today  shows a normal S/D ratio.  There were no signs of absent or  reversed end-diastolic flow.  A biophysical profile performed today was 6 out of 8.  She  received a -2 for absent fetal tone.  Her 24-hour urine results are currently pending.  Based on  her elevated blood  pressures and her elevated P/C ratio, she  probably is developing preeclampsia.  Due to probable preeclampsia at her current gestational age,  now that her diabetic ketoacidosis is resolving, I would  recommend that she receive a complete course of antenatal  corticosteroids.  She should be kept on IV insulin while she is  receiving her steroid course.  She should have another ultrasound later this week to  reassess the amniotic fluid levels. ----------------------------------------------------------------------                   Ma Rings, MD Electronically Signed Final Report   05/31/2019 06:01 pm ----------------------------------------------------------------------

## 2019-06-04 NOTE — Progress Notes (Addendum)
32 5/7 weeks  No C/O, no SOB, no chest pain, no LOF, no bleeding  VS BP 116/55 Vitals:   06/03/19 2012 06/03/19 2317 06/04/19 0042 06/04/19 0500  BP: (!) 112/59 (!) 146/101 134/76 (!) 116/55  Pulse: (!) 106 (!) 104 (!) 114 90  Temp: 98.3 F (36.8 C) 98.7 F (37.1 C)  98.7 F (37.1 C)  Resp: 19 (!) 24 Comment: Just got back from restroom  16  Height:      Weight:      SpO2: 97% 99%  99%  TempSrc: Oral Oral  Oral  BMI (Calculated):       Patient in NAD  FHT cat one UCs occasional irritability  BPP yesterday 8/8           AFI 9.2  FBS 80>insulin infusion held>91  A/P:- FWB>BPP 8/8         FHT cat one         NST q shift         BMZ series completed         - Preeclampsia-no severe features                                  Labs this am pending                                 Labs daily                                 MFM>I would recommend delivery should her liver function tests continue to increase, should her platelet levels decrease, or should her blood pressures be persistently greater than 150/ 100s.           -GDM-will stop IV insulin infusion and start SQ dosing with recommendations per Diabetes Management           -DKA resolved           -Covid + steroids/Remdesivir done            SVT, hypokalemia, telemetry per hospitalist           -DVT prophylaxis-SQ Lovenox           -D/W patient preeclampsia and need for continued hospitalization and close observation.

## 2019-06-05 LAB — COMPREHENSIVE METABOLIC PANEL
ALT: 54 U/L — ABNORMAL HIGH (ref 0–44)
AST: 53 U/L — ABNORMAL HIGH (ref 15–41)
Albumin: 1.8 g/dL — ABNORMAL LOW (ref 3.5–5.0)
Alkaline Phosphatase: 96 U/L (ref 38–126)
Anion gap: 6 (ref 5–15)
BUN: <5 mg/dL — ABNORMAL LOW (ref 6–20)
CO2: 21 mmol/L — ABNORMAL LOW (ref 22–32)
Calcium: 8.7 mg/dL — ABNORMAL LOW (ref 8.9–10.3)
Chloride: 112 mmol/L — ABNORMAL HIGH (ref 98–111)
Creatinine, Ser: 0.64 mg/dL (ref 0.44–1.00)
GFR calc Af Amer: >60 mL/min (ref >60)
GFR calc non Af Amer: >60 mL/min (ref >60)
Glucose, Bld: 98 mg/dL (ref 70–99)
Potassium: 3.6 mmol/L (ref 3.5–5.1)
Sodium: 139 mmol/L (ref 135–145)
Total Bilirubin: 0.5 mg/dL (ref 0.3–1.2)
Total Protein: 5 g/dL — ABNORMAL LOW (ref 6.5–8.1)

## 2019-06-05 LAB — MAGNESIUM: Magnesium: 1.7 mg/dL (ref 1.7–2.4)

## 2019-06-05 MED ORDER — POTASSIUM CHLORIDE CRYS ER 20 MEQ PO TBCR
40.0000 meq | EXTENDED_RELEASE_TABLET | Freq: Once | ORAL | Status: AC
  Administered 2019-06-05: 40 meq via ORAL
  Filled 2019-06-05: qty 2

## 2019-06-05 MED ORDER — INSULIN ASPART 100 UNIT/ML ~~LOC~~ SOLN: 0.0000 [IU] | SUBCUTANEOUS | Status: DC

## 2019-06-05 MED ORDER — INSULIN DETEMIR 100 UNIT/ML ~~LOC~~ SOLN
14.0000 [IU] | Freq: Two times a day (BID) | SUBCUTANEOUS | Status: DC
  Administered 2019-06-05: 14 [IU] via SUBCUTANEOUS
  Filled 2019-06-05 (×4): qty 0.14

## 2019-06-05 MED ORDER — MAGNESIUM SULFATE 2 GM/50ML IV SOLN
2.0000 g | Freq: Once | INTRAVENOUS | Status: AC
  Administered 2019-06-05: 2 g via INTRAVENOUS
  Filled 2019-06-05: qty 50

## 2019-06-05 MED ORDER — GENERIC EXTERNAL MEDICATION
Status: DC
Start: ? — End: 2019-06-05

## 2019-06-05 MED ORDER — PANTOPRAZOLE SODIUM 40 MG PO TBEC
40.0000 mg | DELAYED_RELEASE_TABLET | Freq: Every day | ORAL | Status: DC
  Administered 2019-06-05 – 2019-06-08 (×4): 40 mg via ORAL
  Filled 2019-06-05 (×4): qty 1

## 2019-06-05 MED ORDER — INSULIN ASPART 100 UNIT/ML ~~LOC~~ SOLN
7.0000 [IU] | Freq: Three times a day (TID) | SUBCUTANEOUS | Status: DC
  Administered 2019-06-06: 7 [IU] via SUBCUTANEOUS

## 2019-06-05 MED ORDER — INSULIN ASPART 100 UNIT/ML ~~LOC~~ SOLN
0.0000 [IU] | SUBCUTANEOUS | Status: DC
  Administered 2019-06-05: 2 [IU] via SUBCUTANEOUS
  Administered 2019-06-05: 1 [IU] via SUBCUTANEOUS
  Administered 2019-06-06 (×3): 2 [IU] via SUBCUTANEOUS
  Administered 2019-06-07: 3 [IU] via SUBCUTANEOUS

## 2019-06-05 MED ORDER — MAGNESIUM HYDROXIDE 400 MG/5ML PO SUSP
30.0000 mL | Freq: Once | ORAL | Status: AC
  Administered 2019-06-05: 30 mL via ORAL
  Filled 2019-06-05: qty 30

## 2019-06-05 NOTE — Progress Notes (Signed)
32 6/7  No C/O Good FM  Today's Vitals   06/05/19 0027 06/05/19 0430 06/05/19 0800 06/05/19 0909  BP: (!) 139/94 106/70  (!) 152/78  Pulse: (!) 104 89  (!) 109  Resp: 20 16  18   Temp:  98.9 F (37.2 C)  98.7 F (37.1 C)  TempSrc:  Oral  Oral  SpO2: 97% 97%  98%  Weight:      Height:      PainSc:   0-No pain    Body mass index is 36.84 kg/m.   Abdomen NT  NST today reactive  Results for orders placed or performed during the hospital encounter of 05/29/19 (from the past 24 hour(s))  Glucose, capillary     Status: Abnormal   Collection Time: 06/04/19 11:00 AM  Result Value Ref Range   Glucose-Capillary 100 (H) 70 - 99 mg/dL  Glucose, capillary     Status: Abnormal   Collection Time: 06/04/19 12:03 PM  Result Value Ref Range   Glucose-Capillary 101 (H) 70 - 99 mg/dL  Glucose, capillary     Status: None   Collection Time: 06/04/19  1:09 PM  Result Value Ref Range   Glucose-Capillary 82 70 - 99 mg/dL  Glucose, capillary     Status: None   Collection Time: 06/04/19  2:13 PM  Result Value Ref Range   Glucose-Capillary 75 70 - 99 mg/dL  Glucose, capillary     Status: None   Collection Time: 06/04/19  3:20 PM  Result Value Ref Range   Glucose-Capillary 71 70 - 99 mg/dL  Glucose, capillary     Status: Abnormal   Collection Time: 06/04/19  4:09 PM  Result Value Ref Range   Glucose-Capillary 110 (H) 70 - 99 mg/dL  Glucose, capillary     Status: Abnormal   Collection Time: 06/04/19  5:36 PM  Result Value Ref Range   Glucose-Capillary 116 (H) 70 - 99 mg/dL  Comprehensive metabolic panel     Status: Abnormal   Collection Time: 06/05/19  4:59 AM  Result Value Ref Range   Sodium 139 135 - 145 mmol/L   Potassium 3.6 3.5 - 5.1 mmol/L   Chloride 112 (H) 98 - 111 mmol/L   CO2 21 (L) 22 - 32 mmol/L   Glucose, Bld 98 70 - 99 mg/dL   BUN <5 (L) 6 - 20 mg/dL   Creatinine, Ser 0.64 0.44 - 1.00 mg/dL   Calcium 8.7 (L) 8.9 - 10.3 mg/dL   Total Protein 5.0 (L) 6.5 - 8.1 g/dL   Albumin 1.8 (L) 3.5 - 5.0 g/dL   AST 53 (H) 15 - 41 U/L   ALT 54 (H) 0 - 44 U/L   Alkaline Phosphatase 96 38 - 126 U/L   Total Bilirubin 0.5 0.3 - 1.2 mg/dL   GFR calc non Af Amer >60 >60 mL/min   GFR calc Af Amer >60 >60 mL/min   Anion gap 6 5 - 15  Magnesium     Status: None   Collection Time: 06/05/19  4:59 AM  Result Value Ref Range   Magnesium 1.7 1.7 - 2.4 mg/dL    A/P :- FWB>BPP 8/8 06/03/19         FHT cat one         NST q shift         BMZ series completed         - Preeclampsia-no severe features  CBC today pending                                 Labs daily                                 MFM>I would recommend delivery should her liver function tests continue to increase, should her platelet levels decrease, or should her blood pressures be persistently greater than 150/ 100s.           -GDM-will stop IV insulin infusion and start SQ dosing with recommendations per Diabetes Management           -DKA resolved           -Covid + steroids/Remdesivir done            SVT, hypokalemia, telemetry per hospitalist           -DVT prophylaxis-SQ Lovenox           -D/W again with patient preeclampsia, plan to monitor closely and some of the indications for delivery, ie persistently high BP, abnormal labs or question of FWB. D/W that delivery could be IOL or C/S depending on circumstance. She states she understands.

## 2019-06-05 NOTE — Progress Notes (Signed)
At 2200 hospitalist X. Blount returned page. Hospitalist updated on pt's CBGs. Orders to hold nigh time Levemir received.

## 2019-06-05 NOTE — Progress Notes (Signed)
Blood sugar via freestyle 89.

## 2019-06-05 NOTE — Progress Notes (Addendum)
Dr. Henderson Cloud made aware of pt's blood sugar at lunchtime status post SSI was 79 from 96 with pt c/o lightheadedness. Pt's blood sugar now at 1600 is 93. Ok with MD to administer 1 unit SSI instead of 2 units.

## 2019-06-05 NOTE — Progress Notes (Signed)
Inpatient Diabetes Program Recommendations  Diabetes Treatment Program Recommendations  ADA Standards of Care Diabetes in Pregnancy Target Glucose Ranges:  Fasting: 60 - 90 mg/dL Preprandial: 60 - 592 mg/dL 1 hr postprandial: Less than 140mg /dL (from first bite of meal) 2 hr postprandial: Less than 120 mg/dL (from first bite of meal)   Review of Glycemic Control  Current orders for Inpatient glycemic control: IV insulin  Inpatient Diabetes Program Recommendations:   Insulin for transition from IV to SQ: Once MD is read to transition, please consider ordering Levemir 14 units BID, Novolog 7 units TID with meals for meal coverage if patient eats at least 50% of meals, Novolog 0-16 units Q4H (for first 24 hours and TID under Diabetes Treatment in Pregnancy order set).  NOTE: MD allowed patient to have FreeStyle Libre 2 on 06/04/19 due to frequent glucose monitoring. 06/06/19, RN assisted patient with applying FreeStyle Libre 2 on 06/04/19. Patient remains on IV insulin this morning but is requesting to be transitioned off IV insulin to SQ insulin.  In reviewing glucose trends over the past 24 hours, glucose has ranged from 71-128 mg/dl and patient has received a total of 57.2 units of IV insulin.   Thanks, 06/06/19, RN, MSN, CDE Diabetes Coordinator Inpatient Diabetes Program [phone] (Team Pager from 8am to 5pm)

## 2019-06-05 NOTE — Progress Notes (Signed)
PROGRESS NOTE                                                                                                                                                                                                             Patient Demographics:    Kelli Mcpherson, is a 24 y.o. female, DOB - 05-Jul-1995, ZOX:[id number]  Outpatient Primary MD for the patient is Ranae Pila, MD   Admit date - 05/29/2019   LOS - 7  No chief complaint on file.      Brief Narrative: Patient is a 24 y.o. female with PMHx of gestational diabetes-on Metformin-who presented with DKA and COVID-19 pneumonia.  Hospitalist service consulted for management of these conditions.  Significant Events: 4/27>> Covid positive 5/2>> ED visit for S OB/nausea/vomiting and lower abdominal pain-given IV fluids and discharged home. 5/4>> ED visit with nausea/vomiting-discharged home 5/9>> admit for COVID-19 pneumonia, DKA, hospitalist service consulted.  COVID-19 medications: Steroids: 5/11>>5/12 (betamethasone for OB indication) Remdesivir: 5/9>>5/13  Antibiotics: None  Microbiology data: 5/9: Urine culture: <10,000 colonies/mL insignificant growth, no group B strep isolated  DVT prophylaxis: SQ Lovenox  Procedures: None   Subjective:   Patient wants to try "insulin shots"-she is tired of being hooked up to an IV.  She apparently has been very compliant with her diet for the past 2 days.  Some intermittent cough-but O2 saturation in the high 90s.    Assessment  & Plan :   Acute Hypoxic Resp Failure due to Covid 19 Viral pneumonia: Hypoxia has resolved-her O2 saturations are persistently above 95%.  She has completed a course of remdesivir-since she is not hypoxic-she does not require steroids for COVID-19.   Fever: afebrile O2 requirements:  SpO2: 98 % O2 Flow Rate (L/min): 2 L/min  Prone/Incentive Spirometry: encouraged incentive spirometry  use 3-4/hour.  COVID-19 Labs: Recent Labs    06/03/19 0443  CRP 1.1*       Component Value Date/Time   BNP 26.1 05/29/2019 1247    Recent Labs  Lab 05/29/19 1247  PROCALCITON 0.23    Lab Results  Component Value Date   SARSCOV2NAA POSITIVE (A) 05/17/2019    DKA/starvation ketoacidosis (A1c 7.3): Resolved-she was kept on IV insulin-as she received betamethasone-but given that her diet remains erratic/noncompliant at times-and the fact that she probably has preeclampsia-and needs tight glycemic control-we will  continue with IV insulin.  This morning-patient request that we transition her to subcutaneous insulin as she is tired being on Keppra IV lines, appreciate diabetic coordinator input-discussed with OB MD-we will go ahead and transition her to subcutaneous insulin-start Levemir 14 units twice daily, 7 units of NovoLog with meals and SSI.    Hypokalemia: Repleted.  Hypomagnesemia: Replete-recheck tomorrow.  Subclinical hyperthyroidism: Prior physician discussed with endocrinology-recommendations are for outpatient follow-up.  Thyrotropin receptor antibody negative.  SVT:-appreciate cardiology input.  Echo with preserved EF  Mild transaminitis: Likely secondary to COVID-19-continue to monitor-as could be related to underlying pregnancy related complications.  Has been relatively stable for the past several days-continue to follow periodically.  32 weeks intrauterine pregnancy with probable preeclampsia: Deferred to the primary service  Obesity: Estimated body mass index is 36.84 kg/m as calculated from the following:   Height as of this encounter: 6' (1.829 m).   Weight as of this encounter: 123.2 kg.   ABG:    Component Value Date/Time   HCO3 13.7 (L) 05/29/2019 1746   ACIDBASEDEF 11.2 (H) 05/29/2019 1746   O2SAT 88.5 05/29/2019 1746   Vent Settings: N/A  Condition -  Guarded  Family Communication  : Patient prefers to update family herself-I have asked to let  me know if family has any Covid related questions.  Code Status :  Full Code  Diet :  Diet Order            Diet gestational carb mod Room service appropriate? Yes; Fluid consistency: Thin  Diet effective now               Disposition Plan  :   Per primary service  Antimicorbials  :    Anti-infectives (From admission, onward)   Start     Dose/Rate Route Frequency Ordered Stop   05/30/19 1000  remdesivir 100 mg in sodium chloride 0.9 % 100 mL IVPB  Status:  Discontinued     100 mg 200 mL/hr over 30 Minutes Intravenous Daily 05/29/19 1345 05/29/19 1347   05/30/19 1000  remdesivir 100 mg in sodium chloride 0.9 % 100 mL IVPB     100 mg 200 mL/hr over 30 Minutes Intravenous Daily 05/29/19 1348 06/02/19 1037   05/29/19 1400  remdesivir 200 mg in sodium chloride 0.9% 250 mL IVPB     200 mg 580 mL/hr over 30 Minutes Intravenous Once 05/29/19 1348 05/29/19 1602   05/29/19 1345  remdesivir 200 mg in sodium chloride 0.9% 250 mL IVPB  Status:  Discontinued     200 mg 580 mL/hr over 30 Minutes Intravenous Once 05/29/19 1345 05/29/19 1347      Inpatient Medications  Scheduled Meds: . albuterol  2 puff Inhalation Q6H  . Chlorhexidine Gluconate Cloth  6 each Topical Daily  . clotrimazole  1 Applicatorful Vaginal QHS  . docusate sodium  100 mg Oral Daily  . enoxaparin (LOVENOX) injection  60 mg Subcutaneous Q24H  . famotidine  20 mg Oral BID  . insulin aspart  0-15 Units Subcutaneous Q4H  . insulin aspart  7 Units Subcutaneous TID WC  . insulin detemir  14 Units Subcutaneous BID  . prenatal multivitamin  1 tablet Oral Q1200  . sodium chloride flush  10-40 mL Intracatheter Q12H  . sodium chloride flush  3 mL Intravenous Q12H   Continuous Infusions: . albumin human    . dextrose 5 % and 0.45 % NaCl with KCl 40 mEq/L 75 mL/hr at 06/05/19 0700  . insulin  3.6 Units/hr (06/05/19 0937)   PRN Meds:.[DISCONTINUED] acetaminophen **OR** acetaminophen, acetaminophen, bisacodyl, calcium  carbonate, dextrose, menthol-cetylpyridinium, ondansetron **OR** ondansetron (ZOFRAN) IV, phenol, polyethylene glycol, promethazine, sodium chloride flush, triamcinolone cream, zolpidem   Time Spent in minutes  25  See all Orders from today for further details   Jeoffrey Massed M.D on 06/05/2019 at 9:44 AM  To page go to [email or web address] - use universal password  [facility] -  Office  [phone]    Objective:   Vitals:   06/04/19 2025 06/05/19 0027 06/05/19 0430 06/05/19 0909  BP: (!) 143/72 (!) 139/94 106/70 (!) 152/78  Pulse: 98 (!) 104 89 (!) 109  Resp: 20 20 16 18   Temp: 98 F (36.7 C)  98.9 F (37.2 C) 98.7 F (37.1 C)  TempSrc: Oral  Oral Oral  SpO2: 99% 97% 97% 98%  Weight:      Height:        Wt Readings from Last 3 Encounters:  05/29/19 123.2 kg  05/21/19 (!) 136.5 kg  05/18/19 (!) 136.5 kg     Intake/Output Summary (Last 24 hours) at 06/05/2019 0944 Last data filed at 06/05/2019 0758 Gross per 24 hour  Intake 3055.2 ml  Output 5600 ml  Net -2544.8 ml     Physical Exam Gen Exam:Alert awake-not in any distress HEENT:atraumatic, normocephalic Chest: B/L clear to auscultation anteriorly CVS:S1S2 regular Abdomen:soft non tender, non distended Extremities:no edema Neurology: Non focal Skin: no rash   Data Review:    CBC Recent Labs  Lab 05/30/19 0704 05/30/19 0704 05/31/19 0510 06/01/19 0314 06/02/19 0020 06/03/19 0443 06/04/19 0915  WBC 6.4   < > 7.3 5.6 6.7 6.1 5.4  HGB 11.5*   < > 11.9* 11.7* 11.8* 9.7* 9.8*  HCT 35.4*   < > 36.9 35.5* 36.1 30.7* 30.8*  PLT 233   < > 194 187 204 173 156  MCV 86.1   < > 85.6 84.5 85.3 86.5 87.7  MCH 28.0   < > 27.6 27.9 27.9 27.3 27.9  MCHC 32.5   < > 32.2 33.0 32.7 31.6 31.8  RDW 13.6   < > 13.6 13.4 13.5 13.3 13.9  LYMPHSABS 1.0  --  1.1 1.4 1.7 1.7  --   MONOABS 0.4  --  0.6 0.4 0.4 0.7  --   EOSABS 0.1  --  0.1 0.1 0.1 0.0  --   BASOSABS 0.0  --  0.0 0.0 0.0 0.0  --    < > = values in  this interval not displayed.    Chemistries  Recent Labs  Lab 05/30/19 1954 05/30/19 1954 05/31/19 0510 05/31/19 1800 06/02/19 0020 06/02/19 0020 06/03/19 0443 06/03/19 0443 06/03/19 1458 06/03/19 1625 06/03/19 2133 06/04/19 0915 06/05/19 0459  NA 147*   < > 145   < > 140   < > 139   < > 133* 137 135 142 139  K 3.4*   < > 3.3*   < > 3.1*   < > 2.7*   < > 2.6* 3.2* 3.3* 4.0 3.6  CL 122*   < > 118*   < > 111   < > 111   < > 107 111 109 111 112*  CO2 15*   < > 15*   < > 20*   < > 20*   < > 20* 19* 17* 23 21*  GLUCOSE 116*   < > 121*   < > 98   < > 89   < >  565* 91 105* 135* 98  BUN 10   < > 9   < > 7   < > 5*   < > <5* <5* <5* <5* <5*  CREATININE 0.87   < > 0.97   < > 0.86   < > 0.84   < > 0.83 0.85 0.82 0.84 0.64  CALCIUM 10.2   < > 10.0   < > 9.2   < > 8.6*   < > 7.5* 8.6* 8.7* 9.2 8.7*  MG 1.7  --  1.7  --   --   --   --   --   --  1.6*  --  1.4* 1.7  AST  --   --  48*   < > 53*  --  55*  --   --  57*  --  56* 53*  ALT  --   --  51*   < > 53*  --  50*  --   --  55*  --  57* 54*  ALKPHOS  --   --  105   < > 105  --  88  --   --  91  --  99 96  BILITOT  --   --  0.9   < > 0.6  --  0.8  --   --  0.5  --  0.8 0.5   < > = values in this interval not displayed.   ------------------------------------------------------------------------------------------------------------------ No results for input(s): CHOL, HDL, LDLCALC, TRIG, CHOLHDL, LDLDIRECT in the last 72 hours.  Lab Results  Component Value Date   HGBA1C 7.3 (H) 05/29/2019   ------------------------------------------------------------------------------------------------------------------ No results for input(s): TSH, T4TOTAL, T3FREE, THYROIDAB in the last 72 hours.  Invalid input(s): FREET3 ------------------------------------------------------------------------------------------------------------------ No results for input(s): VITAMINB12, FOLATE, FERRITIN, TIBC, IRON, RETICCTPCT in the last 72 hours.  Coagulation  profile No results for input(s): INR, PROTIME in the last 168 hours.  No results for input(s): DDIMER in the last 72 hours.  Cardiac Enzymes No results for input(s): CKMB, TROPONINI, MYOGLOBIN in the last 168 hours.  Invalid input(s): CK ------------------------------------------------------------------------------------------------------------------    Component Value Date/Time   BNP 26.1 05/29/2019 1247    Micro Results Recent Results (from the past 240 hour(s))  OB Urine Culture     Status: Abnormal   Collection Time: 05/29/19 12:08 PM   Specimen: OB Clean Catch; Urine  Result Value Ref Range Status   Specimen Description OB CLEAN CATCH  Final   Special Requests NONE  Final   Culture (A)  Final    <10,000 COLONIES/mL INSIGNIFICANT GROWTH NO GROUP B STREP (S.AGALACTIAE) ISOLATED Performed at [facility], [address], [place], [place] [address]    Report Status 05/30/2019 FINAL  Final    Radiology Reports DG Chest Port 1 View  Result Date: 06/01/2019 CLINICAL DATA:  COVID-19 positive 05/18/2019, PICC placement EXAM: PORTABLE CHEST 1 VIEW COMPARISON:  05/31/2019 at 3:53 p.m. FINDINGS: Single frontal view of the chest again demonstrates left-sided PICC extending over a left-sided mediastinum, in a configuration suggesting persistent left-sided SVC. No significant change since prior study. Cardiac silhouette is stable. Multifocal bilateral airspace disease greatest in the left mid and right lower lung zones unchanged. No effusion or pneumothorax. IMPRESSION: 1. Stable position of the left-sided PICC line, suggesting persistent left-sided superior vena cava. Please correlate with catheter function. 2. Stable multifocal pneumonia. Electronically Signed   By: Sharlet Salina M.D.   On: 06/01/2019 01:13   DG CHEST PORT  1 VIEW  Addendum Date: 05/31/2019   ADDENDUM REPORT: 05/31/2019 16:25 ADDENDUM: It should be noted left SVC typically drains into the coronary sinus and  subsequently the right atrium as suggested on recent echocardiogram. Current catheter position does not reach the right atrium and is satisfactory for usage. Electronically Signed   By: Alcide Clever M.D.   On: 05/31/2019 16:25   Result Date: 05/31/2019 CLINICAL DATA:  Status post PICC line placement EXAM: PORTABLE CHEST 1 VIEW COMPARISON:  05/29/2019 FINDINGS: Cardiac shadow is again mildly prominent. New left-sided PICC line is seen with the catheter tip presenting over the mid cardiac shadow. Recent echocardiography suggested a left superior vena cava and the catheter position with support this diagnosis. The lungs are well aerated bilaterally with patchy opacities consistent with the given clinical history of COVID-19 positivity. No bony abnormality is seen. IMPRESSION: PICC line placement with the catheter tip projecting over the cardiac shadow to the left of the midline likely within a persistent left superior vena cava. Scattered opacities bilaterally consistent with the given clinical history. Electronically Signed: By: Alcide Clever M.D. On: 05/31/2019 16:12   DG CHEST PORT 1 VIEW  Result Date: 05/29/2019 CLINICAL DATA:  Shortness of breath and cough. COVID-19 positive. EXAM: PORTABLE CHEST 1 VIEW COMPARISON:  May 22, 2019 FINDINGS: Cardiomediastinal silhouette is mildly enlarged. Interval development of patchy bilateral airspace opacities with lower lobe predominance. Osseous structures are without acute abnormality. Soft tissues are grossly normal. IMPRESSION: 1. Interval development of patchy bilateral airspace opacities with lower lobe predominance consistent with atypical pneumonia. 2. Enlarged cardiac silhouette. Electronically Signed   By: Ted Mcalpine M.D.   On: 05/29/2019 12:13   DG Chest Port 1 View  Result Date: 05/22/2019 CLINICAL DATA:  Shortness of breath.  COVID-19. EXAM: PORTABLE CHEST 1 VIEW COMPARISON:  08/07/2018 FINDINGS: The heart size and mediastinal contours are within  normal limits. Both lungs are clear. The visualized skeletal structures are unremarkable. IMPRESSION: Clear lungs. Electronically Signed   By: Deatra Robinson M.D.   On: 05/22/2019 02:41   [place] MFM FETAL BPP WO NON STRESS  Result Date: 06/03/2019 ----------------------------------------------------------------------  OBSTETRICS REPORT                       (Signed Final 06/03/2019 02:33 pm) ---------------------------------------------------------------------- Patient Info  ID #:       [id number]                          D.O.B.:  Dec 12, 1995 (23 yrs)  Name:       Kelli Mcpherson              Visit Date: 06/03/2019 07:09 am ---------------------------------------------------------------------- Performed By  Attending:        Ma Rings MD         Ref. Address:     [facility]  [address]                                                             [place]                                                             [address]  Performed By:     Tommie Raymond BS,       Secondary Phy.:   [facility]                    RDMS, RVT                                                             Care  Referred By:      Candice Camp MD          [facility] ---------------------------------------------------------------------- Orders  #  Description                           Code        Ordered By  1  [place] MFM FETAL BPP WO NON               60454.09    DAVID LOWE     STRESS ----------------------------------------------------------------------  #  Order #                     Accession #                Episode #  1  [id number]                   [id number]                 [id number]  ---------------------------------------------------------------------- Indications  Gestational diabetes in pregnancy,             O24.415  controlled by oral hypoglycemic drugs, DKA  Oligohydraminios, third trimester, unspecified O41.03X0  [redacted] weeks gestation of pregnancy  Z3A.32  Obesity complicating pregnancy, third          O99.213  trimester  Unspecified maternal hypertension, third       O16.3  trimester  COVID19 (+)  (4/27)  Encounter for other antenatal screening        Z36.2  follow-up ---------------------------------------------------------------------- Fetal Evaluation  Num Of Fetuses:         1  Fetal Heart Rate(bpm):  152  Cardiac Activity:       Observed  Presentation:           Cephalic  Placenta:               Anterior  P. Cord Insertion:      Not well visualized  Amniotic Fluid  AFI FV:      Subjectively low-normal  AFI Sum(cm)     %Tile       Largest Pocket(cm)  9.6             14          3.2  RUQ(cm)       RLQ(cm)       LUQ(cm)        LLQ(cm)  3.2           2.7           1.9            1.8 ---------------------------------------------------------------------- Biophysical Evaluation  Amniotic F.V:   Pocket => 2 cm             F. Tone:        Observed  F. Movement:    Observed                   Score:          8/8  F. Breathing:   Observed ---------------------------------------------------------------------- OB History  Gravidity:    1 ---------------------------------------------------------------------- Gestational Age  LMP:           32w 4d        Date:  10/18/18                 EDD:   07/25/19  Best:          Armida Sans 4d     Det. By:  LMP  (10/18/18)          EDD:   07/25/19 ---------------------------------------------------------------------- Cervix Uterus Adnexa  Cervix  Not visualized (advanced GA >24wks)  Uterus  No abnormality visualized.  Right Ovary  Within normal limits. No adnexal mass visualized.  Left Ovary  Within normal limits. No adnexal mass visualized.  Cul De Sac  No  free fluid seen.  Adnexa  No abnormality visualized. ---------------------------------------------------------------------- Comments  This patient has been hospitalized due to diabetic  ketoacidosis which is now resolved.  She has also been  diagnosed with preeclampsia based on her elevated blood  pressures and 24-hour urine results showing 1.2 g of protein.  Her blood pressures remain in the 130s to 140s over 80s to  90s range.  Oligohydramnios had been noted on her prenatal  ultrasounds earlier this week.  The patient has already  received a complete course of antenatal corticosteroids.  A biophysical profile performed today was 8 out of 8.  The  amniotic fluid level has increased to 9.2 cm now that she is  better hydrated.  Due to preeclampsia with mildly elevated liver function tests  in the 50s range, I would recommend that she continue  inpatient management until delivery.  The patient's liver function tests may not be increasing due to  the effects of the antenatal corticosteroids she recently  received.  I anticipate that her lab work may become more  abnormal once the steroid effects go away.  Usually,  expectant management of severe preeclampsia is successful  in delaying delivery between 5 to 7 days.  She should  continue to be followed with daily PIH labs.  I would recommend delivery should her liver function tests  continue to increase, should her platelet levels decrease, or  should her blood pressures be persistently greater than 150/  100s. ----------------------------------------------------------------------                   Ma Rings, MD Electronically Signed Final Report   06/03/2019 02:33 pm ----------------------------------------------------------------------  [place] MFM FETAL BPP WO NON STRESS  Result Date: 05/31/2019 ----------------------------------------------------------------------  OBSTETRICS REPORT                       (Signed Final 05/31/2019 06:01 pm)  ---------------------------------------------------------------------- Patient Info  ID #:       [id number]                          D.O.B.:  [birth year] (23 yrs)  Name:       Kelli Mcpherson              Visit Date: 05/31/2019 09:27 am ---------------------------------------------------------------------- Performed By  Attending:        Ma Rings MD         Ref. Address:     [facility]                                                             [address]                                                             [place]                                                             [phone]  Performed By:  Mesha Tester BS,       Secondary Phy.:   [facility]                    RDMS, RVT                                                             Care  Referred By:      Candice Camp MD          [facility] ---------------------------------------------------------------------- Orders  #  Description                           Code        Ordered By  1  [place] MFM OB DETAIL +14 WK               76811.01    MEGAN MORRIS  2  [place] MFM UA CORD DOPPLER                76820.02    MEGAN MORRIS  3  [place] MFM FETAL BPP WO NON               76819.01    MEGAN MORRIS     STRESS ----------------------------------------------------------------------  #  Order #                     Accession #                Episode #  1  [id number]                   [id number]                 [id number]  2  [id number]                   [id number]                 [id number]  3  [id number]                   [id number]                 [id number] ---------------------------------------------------------------------- Indications  Gestational diabetes in pregnancy,             O24.415  controlled by oral hypoglycemic drugs, DKA   Oligohydraminios, third trimester, unspecified O41.03X0  [redacted] weeks gestation of pregnancy                Z3A.32  Obesity complicating pregnancy, third          O99.213  trimester  Unspecified maternal hypertension, third       O16.3  trimester  COVID19 (+)  (4/27)  Encounter for antenatal screening for          Z36.3  malformations  Encounter for other antenatal screening        Z36.2  follow-up ---------------------------------------------------------------------- Fetal Evaluation  Num Of Fetuses:         1  Fetal Heart Rate(bpm):  142  Cardiac Activity:       Observed  Presentation:           Cephalic  Placenta:               Anterior  P. Cord Insertion:      Not well visualized  Amniotic Fluid  AFI FV:      Oligohydramnios  AFI Sum(cm)     %Tile       Largest Pocket(cm)  5.3             < 3         2.6  RUQ(cm)       RLQ(cm)       LUQ(cm)        LLQ(cm)  1.8           0             0.9            2.6 ---------------------------------------------------------------------- Biophysical Evaluation  Amniotic F.V:   Pocket => 2 cm             F. Tone:        Not Observed  F. Movement:    Observed                   Score:          6/8  F. Breathing:   Observed ---------------------------------------------------------------------- Biometry  BPD:      82.8  mm     G. Age:  33w 2d         76  %    CI:        83.92   %    70 - 86                                                          FL/HC:      21.8   %    19.1 - 21.3  HC:      284.9  mm     G. Age:  31w 2d        4.3  %    HC/AC:      0.98        0.96 - 1.17  AC:       292   mm     G. Age:  33w 1d         79  %    FL/BPD:     75.1   %    71 - 87  FL:       62.2  mm     G. Age:  32w 2d         39  %    FL/AC:      21.3   %    20 - 24  HUM:        57  mm     G. Age:  33w 1d         71  %  CER:      41.3  mm  G. Age:  35w 2d         87  %  LV:        4.5  mm  Est. FW:    2036  gm      4 lb 8 oz     58  %  ---------------------------------------------------------------------- OB History  Gravidity:    1 ---------------------------------------------------------------------- Gestational Age  LMP:           32w 1d        Date:  10/18/18                 EDD:   07/25/19  U/S Today:     32w 4d                                        EDD:   07/22/19  Best:          32w 1d     Det. By:  LMP  (10/18/18)          EDD:   07/25/19 ---------------------------------------------------------------------- Anatomy  Cranium:               Not well visualized    LVOT:                   Appears normal  Cavum:                 Not well visualized    Aortic Arch:            Not well visualized  Ventricles:            Appears normal         Ductal Arch:            Appears normal  Choroid Plexus:        Appears normal         Diaphragm:              Appears normal  Cerebellum:            Not well visualized    Stomach:                Appears normal, left                                                                        sided  Posterior Fossa:       Not well visualized    Abdomen:                Appears normal  Nuchal Fold:           Not applicable (>20    Abdominal Wall:         Not well visualized                         wks GA)  Face:                  Orbits nl; profile not Cord Vessels:           Appears normal (3  well visualized                                vessel cord)  Lips:                  Not well visualized    Kidneys:                Appear normal  Palate:                Not well visualized    Bladder:                Appears normal  Thoracic:              Appears normal         Spine:                  Ltd views no                                                                        intracranial signs of                                                                        NTD  Heart:                 Appears normal         Upper Extremities:      Visualized                         (4CH, axis, and                          situs)  RVOT:                  Appears normal         Lower Extremities:      Visualized  Other:  Technically difficult due to maternal habitus and fetal position.          Technically difficult due to low amniotic fluid. ---------------------------------------------------------------------- Doppler - Fetal Vessels  Umbilical Artery   S/D     %tile      RI               PI                     ADFV    RDFV   2.96       67    0.66             0.88                        No      No ---------------------------------------------------------------------- Cervix Uterus Adnexa  Cervix  Not visualized (advanced GA >24wks)  Uterus  No  abnormality visualized. ---------------------------------------------------------------------- Comments  This patient has been hospitalized due to diabetic  ketoacidosis and possible preeclampsia.  Her blood  pressures remain in the 120s to 150s over 80s to 90s range.  Her blood glucose levels are now in the normal range and her  diabetic ketoacidosis is resolving.  On today's exam, the overall fetal growth was appropriate for  her gestational age.  Oligohydramnios continues to be noted  today.  Doppler studies of the umbilical arteries performed today  shows a normal S/D ratio.  There were no signs of absent or  reversed end-diastolic flow.  A biophysical profile performed today was 6 out of 8.  She  received a -2 for absent fetal tone.  Her 24-hour urine results are currently pending.  Based on  her elevated blood pressures and her elevated P/C ratio, she  probably is developing preeclampsia.  Due to probable preeclampsia at her current gestational age,  now that her diabetic ketoacidosis is resolving, I would  recommend that she receive a complete course of antenatal  corticosteroids.  She should be kept on IV insulin while she is  receiving her steroid course.  She should have another ultrasound later this week to  reassess the amniotic fluid levels.  ----------------------------------------------------------------------                   Ma Rings, MD Electronically Signed Final Report   05/31/2019 06:01 pm ----------------------------------------------------------------------  [place] MFM FETAL BPP WO NON STRESS  Result Date: 05/30/2019 ----------------------------------------------------------------------  OBSTETRICS REPORT                       (Signed Final 05/30/2019 05:11 pm) ---------------------------------------------------------------------- Patient Info  ID #:       [id number]                          D.O.B.:  1995/07/25 (23 yrs)  Name:       Kelli Mcpherson              Visit Date: 05/30/2019 09:39 am ---------------------------------------------------------------------- Performed By  Attending:        Ma Rings MD         Ref. Address:     [facility]                                                             [address]  Jacky [place]                                                             [phone]  Performed By:     Tommie Raymond BS,       Secondary Phy.:   [facility]                    RDMS, RVT                                                             Care  Referred By:      Candice Camp MD          [facility] ---------------------------------------------------------------------- Orders  #  Description                           Code        Ordered By  1  [place] MFM FETAL BPP WO NON               26948.54    MEGAN MORRIS     STRESS ----------------------------------------------------------------------  #  Order #                     Accession #                Episode #  1  [id number]                   [id number]                 [id number]  ---------------------------------------------------------------------- Indications  Oligohydraminios, third trimester, unspecified O41.03X0  Gestational diabetes in pregnancy,             O24.415  controlled by oral hypoglycemic drugs, DKA  Obesity complicating pregnancy, third          O99.213  trimester  Unspecified maternal hypertension, third       O16.3  trimester  COVID19 (+)  (4/27)  [redacted] weeks gestation of pregnancy                Z3A.31 ---------------------------------------------------------------------- Fetal Evaluation  Num Of Fetuses:         1  Fetal Heart Rate(bpm):  166  Cardiac Activity:       Observed  Presentation:           Cephalic  Placenta:               Anterior  P. Cord Insertion:      Not well visualized  Amniotic Fluid  AFI FV:      Oligohydramnios  AFI Sum(cm)     %Tile       Largest Pocket(cm)  2.2             < 3         1.1  RUQ(cm)       RLQ(cm)       LUQ(cm)        LLQ(cm)  1.1           0             1.1            0 ---------------------------------------------------------------------- Biophysical Evaluation  Amniotic F.V:   Oligohydramnios            F. Tone:        Observed  F. Movement:    Not Observed               Score:          4/8  F. Breathing:   Observed ---------------------------------------------------------------------- OB History  Gravidity:    1 ---------------------------------------------------------------------- Gestational Age  LMP:           32w 0d        Date:  10/18/18                 EDD:   07/25/19  Best:          Armida Sans 0d     Det. By:  LMP  (10/18/18)          EDD:   07/25/19 ---------------------------------------------------------------------- Anatomy  Diaphragm:             Appears normal         Kidneys:                Appear normal  Stomach:               Appears normal, left   Bladder:                Appears normal                         sided  Other:  Technically difficult due to low amniotic fluid. Technically difficult due          to maternal  habitus and fetal position. ---------------------------------------------------------------------- Cervix Uterus Adnexa  Cervix  Not visualized (advanced GA >24wks)  Uterus  No abnormality visualized.  Right Ovary  Within normal limits. No adnexal mass visualized.  Left Ovary  Within normal limits. No adnexal mass visualized.  Cul De Sac  No free fluid seen.  Adnexa  No abnormality visualized. ---------------------------------------------------------------------- Comments  This patient has been hospitalized due to diabetic  ketoacidosis and persistent nausea and vomiting.  She also  tested positive for COVID-19 about 2 weeks ago.  She is  currently being treated with remdesivir for Covid pneumonia.  She has been receiving IV hydration and IV insulin to help  resolve the diabetic ketoacidosis.  This morning, her fetal heart rate tracing has improved.  There are periods of variability and accelerations.  No further  decelerations are noted.  The patient is also more alert and  awake.  Her labs show hypernatremia with a sodium level of 150,  serum creatinine of 1.03, and AST and ALT that are mildly  elevated (51).  Her fingerstick values are now in the 110s to  120s range.  Her blood pressures remain in the 140s over  80s range.  A biophysical profile performed today was 4 out of 8.  The  patient received -2 for absent fetal movements and also -2 for  anhydramnios.  I reviewed the patient's ultrasound images from yesterday.  A  >2 cm pocket of amniotic fluid was noted yesterday.  Due to her symptoms of persistent nausea and vomiting, her  elevated blood pressures, her elevated liver function tests,  and her elevated serum creatinine level, a diagnosis of  preeclampsia has not been ruled out.  A P/C ratio performed this afternoon was 0.86 (elevated).  A  24-hour urine collection is underway to assess the total  protein.  A course of antenatal corticosteroids for fetal lung maturity  has been held due to concerns that it  may elevate her blood  glucose levels and further worsen her diabetic ketoacidosis.  The patient should be kept on continuous monitoring  overnight.  I would recommend delivery should the fetal  status be nonreassuring.  Should her diabetic ketoacidosis continue to resolve and her  blood pressures remain elevated therefore increasing the  suspicion for preeclampsia, a course of antenatal  corticosteroids should be administered tomorrow morning.  The patient will continue to receive IV hydration overnight.  We will repeat another ultrasound tomorrow to assess the  amniotic fluid levels, the fetal growth, and umbilical artery  Doppler studies. ----------------------------------------------------------------------                   Ma Rings, MD Electronically Signed Final Report   05/30/2019 05:11 pm ----------------------------------------------------------------------  [place] MFM FETAL BPP WO NON STRESS  Result Date: 05/29/2019 ----------------------------------------------------------------------  OBSTETRICS REPORT                       (Signed Final 05/29/2019 12:15 pm) ---------------------------------------------------------------------- Patient Info  ID #:       [id number]                          D.O.B.:  06-17-1995 (23 yrs)  Name:       Kelli Mcpherson              Visit Date: 05/29/2019 09:58 am ---------------------------------------------------------------------- Performed By  Attending:        Lin Landsman      Ref. Address:      [facility]                                                              [address]  [address]                                                              [place]                                                              [id number]  Performed By:     Birdena Crandall        [facility]  Referred By:      Candice Camp MD ---------------------------------------------------------------------- Orders  #  Description                           Code        Ordered By  1  [place] MFM FETAL BPP WO NON               60454.09    DAVID LOWE     STRESS ----------------------------------------------------------------------  #  Order #                     Accession #                Episode #  1  [id number]                   [id number]                 [id number] ---------------------------------------------------------------------- Indications  Gestational diabetes in pregnancy,              O24.415  controlled by oral hypoglycemic drugs, DKA  Obesity complicating pregnancy, third           O99.213  trimester  Unspecified maternal hypertension, third        O16.3  trimester  COVID19 (+)  (4/27)  Oligohydraminios, third trimester, unspecified  O41.03X0  [redacted] weeks gestation of pregnancy                 Z3A.31 ---------------------------------------------------------------------- Fetal Evaluation  Num Of Fetuses:          1  Fetal Heart Rate(bpm):   150  Cardiac Activity:        Observed  Presentation:            Cephalic  Placenta:                Anterior  Amniotic Fluid  AFI FV:      Within normal limits  AFI Sum(cm)     %Tile       Largest Pocket(cm)  9.5             12          2.8  RUQ(cm)       RLQ(cm)       LUQ(cm)        LLQ(cm)  2.8           2.5           2.8            1.4 ---------------------------------------------------------------------- Biophysical Evaluation  Amniotic F.V:   Within normal limits       F. Tone:         Observed  F. Movement:    Not Observed               Score:           4/8  F. Breathing:   Not Observed ---------------------------------------------------------------------- OB History  Gravidity:    1 ---------------------------------------------------------------------- Gestational Age  LMP:           31w 6d        Date:   10/18/18                 EDD:   07/25/19  Best:          31w 6d     Det. By:  LMP  (10/18/18)          EDD:   07/25/19 ---------------------------------------------------------------------- Anatomy  Stomach:               Appears normal, left   Kidneys:                Appear normal                         sided  Abdominal Wall:        Appears nml (cord      Bladder:                Appears normal                         insert, abd wall) ---------------------------------------------------------------------- Impression  Limited exam due to presumed maternal DKA  Biophysical profile 4 out of 8 there is some movement but not  enough to pass the BPP.  Labs are pending with continued fetal monitoring.  I discussed with Dr. Rana Snare the current plan and agree. ---------------------------------------------------------------------- Recommendations  Continueed Fetal monitoring  Hold Betamethsone  DKA protocol including insulin drip and fluid hydration with  the goal of blood sugars 100-140's.  Will await labs to perform consultation and diagnosis of DKA.  Continue COVID protocol. ----------------------------------------------------------------------               Lin Landsman, MD Electronically Signed Final Report   05/29/2019 12:15 pm ----------------------------------------------------------------------  [place] MFM OB DETAIL +14 WK  Result Date: 05/31/2019 ----------------------------------------------------------------------  OBSTETRICS REPORT                       (Signed Final 05/31/2019 06:01 pm) ---------------------------------------------------------------------- Patient Info  ID #:       [id number]                          D.O.B.:  1995-11-03 (23 yrs)  Name:       Kelli Mcpherson              Visit Date: 05/31/2019 09:27 am ---------------------------------------------------------------------- Performed By  Attending:        Ma Rings MD  Ref. Address:     [facility]                                                             [address]                                                             [place]                                                             [phone]  Performed By:     Tommie Raymond BS,       Secondary Phy.:   [facility]                    RDMS, RVT                                                             Care  Referred By:      Candice Camp MD          [facility] ---------------------------------------------------------------------- Orders  #  Description                           Code        Ordered By  1  [place] MFM OB DETAIL +14 WK               76811.01    MEGAN MORRIS  2  [place] MFM UA CORD DOPPLER  [address].60    MEGAN MORRIS  3  [place] MFM FETAL BPP WO NON               [id number]    MEGAN MORRIS     STRESS ----------------------------------------------------------------------  #  Order #                     Accession #                Episode #  1  [id number]                   [id number]                 [id number]  2  [id number]                   [id number]                 [id number]  3  [id number]                   [id number]                 [id number] ---------------------------------------------------------------------- Indications  Gestational diabetes in pregnancy,             O24.415  controlled by oral hypoglycemic drugs, DKA  Oligohydraminios, third trimester, unspecified O41.03X0  [redacted] weeks gestation of pregnancy                Z3A.32  Obesity complicating pregnancy, third          O99.213  trimester  Unspecified maternal hypertension, third       O16.3  trimester  COVID19 (+)  (4/27)  Encounter for antenatal screening for          Z36.3  malformations  Encounter for other antenatal screening        Z36.2  follow-up  ---------------------------------------------------------------------- Fetal Evaluation  Num Of Fetuses:         1  Fetal Heart Rate(bpm):  142  Cardiac Activity:       Observed  Presentation:           Cephalic  Placenta:               Anterior  P. Cord Insertion:      Not well visualized  Amniotic Fluid  AFI FV:      Oligohydramnios  AFI Sum(cm)     %Tile       Largest Pocket(cm)  5.3             < 3         2.6  RUQ(cm)       RLQ(cm)       LUQ(cm)        LLQ(cm)  1.8           0             0.9            2.6 ---------------------------------------------------------------------- Biophysical Evaluation  Amniotic F.V:   Pocket => 2 cm             F. Tone:        Not Observed  F. Movement:    Observed                   Score:          6/8  F. Breathing:   Observed ---------------------------------------------------------------------- Biometry  BPD:      82.8  mm     G. Age:  33w 2d         76  %    CI:        83.92   %    70 - 86                                                          FL/HC:      21.8   %    19.1 - 21.3  HC:      284.9  mm     G. Age:  31w 2d        4.3  %    HC/AC:      0.98        0.96 - 1.17  AC:       292   mm     G. Age:  33w 1d         79  %    FL/BPD:     75.1   %    71 - 87  FL:       62.2  mm     G. Age:  32w 2d         39  %    FL/AC:      21.3   %    20 - 24  HUM:        57  mm     G. Age:  33w 1d         71  %  CER:      41.3  mm     G. Age:  35w 2d         87  %  LV:        4.5  mm  Est. FW:    2036  gm      4 lb 8 oz     58  % ---------------------------------------------------------------------- OB History  Gravidity:    1 ---------------------------------------------------------------------- Gestational Age  LMP:           32w 1d        Date:  10/18/18                 EDD:   07/25/19  U/S Today:     32w 4d                                        EDD:   07/22/19  Best:          32w 1d     Det. By:  LMP  (10/18/18)          EDD:   07/25/19  ---------------------------------------------------------------------- Anatomy  Cranium:               Not well visualized    LVOT:                   Appears normal  Cavum:                 Not well visualized    Aortic Arch:            Not well  visualized  Ventricles:            Appears normal         Ductal Arch:            Appears normal  Choroid Plexus:        Appears normal         Diaphragm:              Appears normal  Cerebellum:            Not well visualized    Stomach:                Appears normal, left                                                                        sided  Posterior Fossa:       Not well visualized    Abdomen:                Appears normal  Nuchal Fold:           Not applicable (>33    Abdominal Wall:         Not well visualized                         wks GA)  Face:                  Orbits nl; profile not Cord Vessels:           Appears normal (3                         well visualized                                vessel cord)  Lips:                  Not well visualized    Kidneys:                Appear normal  Palate:                Not well visualized    Bladder:                Appears normal  Thoracic:              Appears normal         Spine:                  Ltd views no                                                                        intracranial signs of  NTD  Heart:                 Appears normal         Upper Extremities:      Visualized                         (4CH, axis, and                         situs)  RVOT:                  Appears normal         Lower Extremities:      Visualized  Other:  Technically difficult due to maternal habitus and fetal position.          Technically difficult due to low amniotic fluid. ---------------------------------------------------------------------- Doppler - Fetal Vessels  Umbilical Artery   S/D     %tile      RI               PI                     ADFV     RDFV   2.96       67    0.66             0.88                        No      No ---------------------------------------------------------------------- Cervix Uterus Adnexa  Cervix  Not visualized (advanced GA >24wks)  Uterus  No abnormality visualized. ---------------------------------------------------------------------- Comments  This patient has been hospitalized due to diabetic  ketoacidosis and possible preeclampsia.  Her blood  pressures remain in the 120s to 150s over 80s to 90s range.  Her blood glucose levels are now in the normal range and her  diabetic ketoacidosis is resolving.  On today's exam, the overall fetal growth was appropriate for  her gestational age.  Oligohydramnios continues to be noted  today.  Doppler studies of the umbilical arteries performed today  shows a normal S/D ratio.  There were no signs of absent or  reversed end-diastolic flow.  A biophysical profile performed today was 6 out of 8.  She  received a -2 for absent fetal tone.  Her 24-hour urine results are currently pending.  Based on  her elevated blood pressures and her elevated P/C ratio, she  probably is developing preeclampsia.  Due to probable preeclampsia at her current gestational age,  now that her diabetic ketoacidosis is resolving, I would  recommend that she receive a complete course of antenatal  corticosteroids.  She should be kept on IV insulin while she is  receiving her steroid course.  She should have another ultrasound later this week to  reassess the amniotic fluid levels. ----------------------------------------------------------------------                   Ma Rings, MD Electronically Signed Final Report   05/31/2019 06:01 pm ----------------------------------------------------------------------  ECHOCARDIOGRAM COMPLETE  Result Date: 05/31/2019    ECHOCARDIOGRAM REPORT   Patient Name:   Kelli Mcpherson Date of Exam: 05/31/2019 Medical Rec #:  [id number]          Height:       72.0 in Accession #:     [id number]         Weight:  271.6 lb Date of Birth:  11/09/1995          BSA:          2.427 m Patient Age:    23 years           BP:           133/68 mmHg Patient Gender: F                  HR:           109 bpm. Exam Location:  Inpatient Procedure: 2D Echo, Color Doppler and Cardiac Doppler Indications:    I47.1 SVT  History:        Patient has no prior history of Echocardiogram examinations.                 COVID+ 05/17/19                 [redacted] Weeks Pregnant at time of study.  Sonographer:    Irving Burton Senior RDCS Referring Phys: [id number] DAWOOD S Randol Kern  Sonographer Comments: Technically difficult due to patient body habitus. IMPRESSIONS  1. Left ventricular ejection fraction, by estimation, is 50 to 55%. The left ventricle has low normal function. The left ventricle demonstrates global hypokinesis. Indeterminate diastolic filling due to E-A fusion. There is abnormal (paradoxical) septal  motion, consistent with right ventricular volume overload.  2. Right ventricular systolic function is normal. The right ventricular size is normal. Tricuspid regurgitation signal is inadequate for assessing PA pressure.  3. 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 The inferior vena cava is also dilated, but the right heart chambers are not enlarged.  4. The mitral valve is normal in structure. No evidence of mitral valve regurgitation. No evidence of mitral stenosis.  5. The aortic valve is normal in structure. Aortic valve regurgitation is not visualized. No aortic stenosis is present.  6. The inferior vena cava is dilated in size with <50% respiratory variability, suggesting right atrial pressure of 15 mmHg. Comparison(s): No prior Echocardiogram. Conclusion(s)/Recommendation(s): Recommend a saline contrast injection from a vein in the left upper extremity, with imaging of the coronary sinus. FINDINGS  Left Ventricle: Left ventricular ejection fraction, by estimation, is 50 to 55%.  The left ventricle has low normal function. The left ventricle demonstrates global hypokinesis. The left ventricular internal cavity size was normal in size. There is no left ventricular hypertrophy. Abnormal (paradoxical) septal motion, consistent with right ventricular volume overload. Indeterminate diastolic filling due to E-A fusion. Right Ventricle: The right ventricular size is normal. No increase in right ventricular wall thickness. Right ventricular systolic function is normal. Tricuspid regurgitation signal is inadequate for assessing PA pressure. Left Atrium: Left atrial size was normal in size. 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 The inferior vena cava is also dilated, but the right heart chambers are not enlarged. Right atrial size was normal in size. Pericardium: There is no evidence of pericardial effusion. Mitral Valve: The mitral valve is normal in structure. Normal mobility of the mitral valve leaflets. No evidence of mitral valve regurgitation. No evidence of mitral valve stenosis. Tricuspid Valve: The tricuspid valve is normal in structure. Tricuspid valve regurgitation is not demonstrated. No evidence of tricuspid stenosis. Aortic Valve: The aortic valve is normal in structure. Aortic valve regurgitation is not visualized. No aortic stenosis is present. Pulmonic Valve: The pulmonic valve was normal in structure. Pulmonic valve regurgitation is not  visualized. No evidence of pulmonic stenosis. Aorta: The aortic root is normal in size and structure. Venous: The inferior vena cava is dilated in size with less than 50% respiratory variability, suggesting right atrial pressure of 15 mmHg. IAS/Shunts: No atrial level shunt detected by color flow Doppler.  LEFT VENTRICLE PLAX 2D LVIDd:         5.00 cm LVIDs:         3.80 cm LV PW:         1.10 cm LV IVS:        1.40 cm LVOT diam:     2.00 cm LV SV:         56 LV SV Index:   23 LVOT Area:      3.14 cm  RIGHT VENTRICLE RV S prime:     12.20 cm/s TAPSE (M-mode): 1.8 cm LEFT ATRIUM           Index       RIGHT ATRIUM           Index LA diam:      3.20 cm 1.32 cm/m  RA Area:     17.60 cm LA Vol (A2C): 45.3 ml 18.67 ml/m RA Volume:   51.10 ml  21.06 ml/m LA Vol (A4C): 47.7 ml 19.66 ml/m  AORTIC VALVE LVOT Vmax:   104.00 cm/s LVOT Vmean:  79.700 cm/s LVOT VTI:    0.179 m  AORTA Ao Root diam: 3.30 cm Ao Asc diam:  2.80 cm  SHUNTS Systemic VTI:  0.18 m Systemic Diam: 2.00 cm Thurmon Fair MD Electronically signed by Thurmon Fair MD Signature Date/Time: 05/31/2019/9:09:24 AM    Final    [place] EKG SITE RITE  Result Date: 05/31/2019 If Site Rite image not attached, placement could not be confirmed due to current cardiac rhythm.  [place] MFM UA CORD DOPPLER  Result Date: 05/31/2019 ----------------------------------------------------------------------  OBSTETRICS REPORT                       (Signed Final 05/31/2019 06:01 pm) ---------------------------------------------------------------------- Patient Info  ID #:       [id number]                          D.O.B.:  [birth year] (23 yrs)  Name:       Kelli Mcpherson              Visit Date: 05/31/2019 09:27 am ---------------------------------------------------------------------- Performed By  Attending:        Ma Rings MD         Ref. Address:     [facility]                                                             [address]  [address]                                                             [place]                                                             [phone]  Performed By:     Tommie Raymond BS,       Secondary Phy.:   [facility]                    RDMS, RVT                                                             Care  Referred By:      Candice Camp MD          [facility] ---------------------------------------------------------------------- Orders  #  Description                           Code        Ordered By  1  [place] MFM OB DETAIL +14 WK               76811.01    MEGAN MORRIS  2  [place] MFM UA CORD DOPPLER                76820.02    MEGAN MORRIS  3  [place] MFM FETAL BPP WO NON               76819.01    MEGAN MORRIS     STRESS ----------------------------------------------------------------------  #  Order #                     Accession #                Episode #  1  [id number]                   [id number]                 [id number]  2  [id number]                   [id number]                 [id number]  3  [id number]  [id number]                 [id number] ---------------------------------------------------------------------- Indications  Gestational diabetes in pregnancy,             O24.415  controlled by oral hypoglycemic drugs, DKA  Oligohydraminios, third trimester, unspecified O41.03X0  [redacted] weeks gestation of pregnancy                Z3A.32  Obesity complicating pregnancy, third          O99.213  trimester  Unspecified maternal hypertension, third       O16.3  trimester  COVID19 (+)  (4/27)  Encounter for antenatal screening for          Z36.3  malformations  Encounter for other antenatal screening        Z36.2  follow-up ---------------------------------------------------------------------- Fetal Evaluation  Num Of Fetuses:         1  Fetal Heart Rate(bpm):  142  Cardiac Activity:       Observed  Presentation:           Cephalic  Placenta:               Anterior  P. Cord Insertion:      Not well visualized  Amniotic Fluid  AFI FV:      Oligohydramnios  AFI Sum(cm)     %Tile       Largest Pocket(cm)  5.3             < 3         2.6  RUQ(cm)       RLQ(cm)       LUQ(cm)        LLQ(cm)  1.8           0             0.9            2.6 ---------------------------------------------------------------------- Biophysical  Evaluation  Amniotic F.V:   Pocket => 2 cm             F. Tone:        Not Observed  F. Movement:    Observed                   Score:          6/8  F. Breathing:   Observed ---------------------------------------------------------------------- Biometry  BPD:      82.8  mm     G. Age:  33w 2d         76  %    CI:        83.92   %    70 - 86                                                          FL/HC:      21.8   %    19.1 - 21.3  HC:      284.9  mm     G. Age:  31w 2d        4.3  %    HC/AC:      0.98        0.96 - 1.17  AC:       292   mm  G. Age:  33w 1d         79  %    FL/BPD:     75.1   %    71 - 87  FL:       62.2  mm     G. Age:  32w 2d         39  %    FL/AC:      21.3   %    20 - 24  HUM:        57  mm     G. Age:  33w 1d         71  %  CER:      41.3  mm     G. Age:  35w 2d         87  %  LV:        4.5  mm  Est. FW:    2036  gm      4 lb 8 oz     58  % ---------------------------------------------------------------------- OB History  Gravidity:    1 ---------------------------------------------------------------------- Gestational Age  LMP:           32w 1d        Date:  10/18/18                 EDD:   07/25/19  U/S Today:     32w 4d                                        EDD:   07/22/19  Best:          32w 1d     Det. By:  LMP  (10/18/18)          EDD:   07/25/19 ---------------------------------------------------------------------- Anatomy  Cranium:               Not well visualized    LVOT:                   Appears normal  Cavum:                 Not well visualized    Aortic Arch:            Not well visualized  Ventricles:            Appears normal         Ductal Arch:            Appears normal  Choroid Plexus:        Appears normal         Diaphragm:              Appears normal  Cerebellum:            Not well visualized    Stomach:                Appears normal, left                                                                        sided  Posterior Fossa:  Not well visualized     Abdomen:                Appears normal  Nuchal Fold:           Not applicable (>20    Abdominal Wall:         Not well visualized                         wks GA)  Face:                  Orbits nl; profile not Cord Vessels:           Appears normal (3                         well visualized                                vessel cord)  Lips:                  Not well visualized    Kidneys:                Appear normal  Palate:                Not well visualized    Bladder:                Appears normal  Thoracic:              Appears normal         Spine:                  Ltd views no                                                                        intracranial signs of                                                                        NTD  Heart:                 Appears normal         Upper Extremities:      Visualized                         (4CH, axis, and                         situs)  RVOT:                  Appears normal         Lower Extremities:      Visualized  Other:  Technically difficult due to maternal habitus and fetal position.  Technically difficult due to low amniotic fluid. ---------------------------------------------------------------------- Doppler - Fetal Vessels  Umbilical Artery   S/D     %tile      RI               PI                     ADFV    RDFV   2.96       67    0.66             0.88                        No      No ---------------------------------------------------------------------- Cervix Uterus Adnexa  Cervix  Not visualized (advanced GA >24wks)  Uterus  No abnormality visualized. ---------------------------------------------------------------------- Comments  This patient has been hospitalized due to diabetic  ketoacidosis and possible preeclampsia.  Her blood  pressures remain in the 120s to 150s over 80s to 90s range.  Her blood glucose levels are now in the normal range and her  diabetic ketoacidosis is resolving.  On today's exam, the overall fetal growth  was appropriate for  her gestational age.  Oligohydramnios continues to be noted  today.  Doppler studies of the umbilical arteries performed today  shows a normal S/D ratio.  There were no signs of absent or  reversed end-diastolic flow.  A biophysical profile performed today was 6 out of 8.  She  received a -2 for absent fetal tone.  Her 24-hour urine results are currently pending.  Based on  her elevated blood pressures and her elevated P/C ratio, she  probably is developing preeclampsia.  Due to probable preeclampsia at her current gestational age,  now that her diabetic ketoacidosis is resolving, I would  recommend that she receive a complete course of antenatal  corticosteroids.  She should be kept on IV insulin while she is  receiving her steroid course.  She should have another ultrasound later this week to  reassess the amniotic fluid levels. ----------------------------------------------------------------------                   Ma Rings, MD Electronically Signed Final Report   05/31/2019 06:01 pm ----------------------------------------------------------------------

## 2019-06-05 NOTE — Progress Notes (Signed)
Hypoglycemic Event  CBG: 62  Treatment:4oz beverage and banana  Symptoms: asymptomatic   Follow-up CBG:   Time:2355 CBG Result: 59 via freestyle monitor.  Possible Reasons for Event: Inadequate meal intake  Comments/MD notified: Order to follow up with Diabetic Coordinator in AM  Checked CBG via freestlye which resulted in CBG of 62. Treated with banana and milk. Rechecked with freestyle was 59. Obtained finger stick to verify results. Finger stick resulted at 96. Educated pt on s/s of hypoglycemia and when to call RN. Will monitor.   Kelli Mcpherson

## 2019-06-05 NOTE — Progress Notes (Signed)
Dr. Henderson Cloud notified of Hospitalist decision to hold Levemir @2205 . Dr. agreeable and states to adjust sliding scale for tonight from CBG 91-120 receiving 2 units of novoLOG insulin treatment to CBG 101-120 receiving 2 units of novoLOG insulin treatment. Consult Diabetes Coordinator in AM.

## 2019-06-05 NOTE — Progress Notes (Signed)
Hypoglycemic Event  CBG: 67  Treatment: 4 oz juice/soda  Symptoms: None  Follow-up CBG: Time:  2037 CBG Result: 69  Repeat CBG:  Time: 2052 CBG Result: 87  Possible Reasons for Event: Medication regimen: Insulin coverage   Comments/MD notified: Dr. Henderson Cloud  While in pt's room, Freestyle Libre2 monitor alerted RN to recheck pt's CBG d/t low levels detected. RN rechecked CBG via freestyle which resulted in a CBG of 67. Pt asymptomatic. Hypoglycemia protocol followed and treated. 4oz of juice, then repeat CBG resulted at 69. Gave an additional 4oz juice and pt ate half of a banana. CBG rechecked after resulted in CBG of 87. Dr. Henderson Cloud notified at 2125. Dr. Henderson Cloud instructed RN to reach out to hospitalist, hospitalist paged. Will continue to monitor.     Corbin Ade

## 2019-06-06 ENCOUNTER — Inpatient Hospital Stay (HOSPITAL_BASED_OUTPATIENT_CLINIC_OR_DEPARTMENT_OTHER): Payer: Managed Care, Other (non HMO)

## 2019-06-06 DIAGNOSIS — Z3A33 33 weeks gestation of pregnancy: Secondary | ICD-10-CM | POA: Diagnosis not present

## 2019-06-06 DIAGNOSIS — O1493 Unspecified pre-eclampsia, third trimester: Secondary | ICD-10-CM

## 2019-06-06 LAB — GLUCOSE, CAPILLARY
Glucose-Capillary: 100 mg/dL — ABNORMAL HIGH (ref 70–99)
Glucose-Capillary: 106 mg/dL — ABNORMAL HIGH (ref 70–99)
Glucose-Capillary: 111 mg/dL — ABNORMAL HIGH (ref 70–99)
Glucose-Capillary: 96 mg/dL (ref 70–99)
Glucose-Capillary: 96 mg/dL (ref 70–99)

## 2019-06-06 LAB — CBC
HCT: 32.5 % — ABNORMAL LOW (ref 36.0–46.0)
Hemoglobin: 10.2 g/dL — ABNORMAL LOW (ref 12.0–15.0)
MCH: 27.9 pg (ref 26.0–34.0)
MCHC: 31.4 g/dL (ref 30.0–36.0)
MCV: 89 fL (ref 80.0–100.0)
Platelets: 127 10*3/uL — ABNORMAL LOW (ref 150–400)
RBC: 3.65 MIL/uL — ABNORMAL LOW (ref 3.87–5.11)
RDW: 14.6 % (ref 11.5–15.5)
WBC: 5.5 10*3/uL (ref 4.0–10.5)
nRBC: 0.6 % — ABNORMAL HIGH (ref 0.0–0.2)

## 2019-06-06 LAB — COMPREHENSIVE METABOLIC PANEL
ALT: 58 U/L — ABNORMAL HIGH (ref 0–44)
AST: 58 U/L — ABNORMAL HIGH (ref 15–41)
Albumin: 2 g/dL — ABNORMAL LOW (ref 3.5–5.0)
Alkaline Phosphatase: 98 U/L (ref 38–126)
Anion gap: 8 (ref 5–15)
BUN: <5 mg/dL — ABNORMAL LOW (ref 6–20)
CO2: 23 mmol/L (ref 22–32)
Calcium: 8.8 mg/dL — ABNORMAL LOW (ref 8.9–10.3)
Chloride: 106 mmol/L (ref 98–111)
Creatinine, Ser: 0.75 mg/dL (ref 0.44–1.00)
GFR calc Af Amer: >60 mL/min (ref >60)
GFR calc non Af Amer: >60 mL/min (ref >60)
Glucose, Bld: 98 mg/dL (ref 70–99)
Potassium: 3.6 mmol/L (ref 3.5–5.1)
Sodium: 137 mmol/L (ref 135–145)
Total Bilirubin: 0.7 mg/dL (ref 0.3–1.2)
Total Protein: 5.6 g/dL — ABNORMAL LOW (ref 6.5–8.1)

## 2019-06-06 LAB — MAGNESIUM: Magnesium: 1.5 mg/dL — ABNORMAL LOW (ref 1.7–2.4)

## 2019-06-06 MED ORDER — INSULIN ASPART 100 UNIT/ML ~~LOC~~ SOLN
4.0000 [IU] | Freq: Three times a day (TID) | SUBCUTANEOUS | Status: DC
  Administered 2019-06-06 – 2019-06-07 (×3): 4 [IU] via SUBCUTANEOUS

## 2019-06-06 MED ORDER — GENERIC EXTERNAL MEDICATION
Status: DC
Start: ? — End: 2019-06-06

## 2019-06-06 MED ORDER — INSULIN DETEMIR 100 UNIT/ML ~~LOC~~ SOLN
14.0000 [IU] | Freq: Every day | SUBCUTANEOUS | Status: DC
  Administered 2019-06-06: 14 [IU] via SUBCUTANEOUS
  Filled 2019-06-06 (×2): qty 0.14

## 2019-06-06 NOTE — Progress Notes (Addendum)
Inpatient Diabetes Program Recommendations  AACE/ADA: New Consensus Statement on Inpatient Glycemic Control (2015)  Target Ranges:  Prepandial:   less than 140 mg/dL      Peak postprandial:   less than 180 mg/dL (1-2 hours)      Critically ill patients:  140 - 180 mg/dL   Lab Results  Component Value Date   GLUCAP 96 06/06/2019   HGBA1C 7.3 (H) 05/29/2019    Review of Glycemic Control Results for Kelli Mcpherson, Kelli Mcpherson" (MRN [id number]) as of 06/06/2019 08:48  Ref. Range 06/06/2019 00:09  Glucose-Capillary Latest Ref Range: 70 - 99 mg/dL 96    Current orders for Inpatient glycemic control:  Inpatient Diabetes Program Recommendations:Novolog 0-16 units Q4H, Levemir 14 units BID, Novolog 7 units TID  Patient missed QHS dose of Levemir (per MD order) and missed multiple doses of Novolog meal coverage due to CBG ranges < 80 mg/dL.   Consider decreasing Levemir to 12 units QD.  Question need for meal coverage. Will reassess at lunch time.  Secure chat sent to Dr Jerral Ralph.   Thanks, Lujean Rave, MSN, RNC-OB Diabetes Coordinator [phone] (8a-5p)

## 2019-06-06 NOTE — Progress Notes (Signed)
Initial Nutrition Assessment  DOCUMENTATION CODES:  Obesity unspecified  INTERVENTION:  Carbohydrate modified gestational diabetic diet Double protein portions  NUTRITION DIAGNOSIS:  Altered nutrition lab value related to (Gestational diabetes) as evidenced by (hyperglycemia, Hx of DKA).  GOAL:  Patient will meet greater than or equal to 90% of their needs  MONITOR:  Labs  REASON FOR ASSESSMENT:  Antenatal, Gestational Diabetes   ASSESSMENT:  Now 33 weeks, adm with COVID,GDM adm in DKA now resolved. PEC.  no recent wt. Adm wt likely same as prepreg wt, but it appears that there was generous weight gain prior to DKA.  Given pts BMI, energy requirements are assessed as same as they are for first trimenster ( EER) to avoid high rate of weight gain  Diet Order:   Diet Order            Diet gestational carb mod Room service appropriate? Yes; Fluid consistency: Thin  Diet effective now             EDUCATION NEEDS:   No education needs have been identified at this time  Skin:  Skin Assessment: Reviewed RN Assessment  Height:   Ht Readings from Last 1 Encounters:  05/29/19 6' (1.829 m)    Weight:   Wt Readings from Last 1 Encounters:  05/29/19 123.2 kg   Ideal Body Weight:   160 lbs/ 73 Kg  BMI:  Body mass index is 36.84 kg/m.  Estimated Nutritional Needs:   Kcal:  2000- 2200  Protein:  90-100 g  Fluid:  2.3-2.4 L    Elisabeth Cara M.Odis Luster LDN Neonatal Nutrition Support Specialist/RD III

## 2019-06-06 NOTE — Progress Notes (Signed)
Unable to obtain fasting AM CBG d/t pt snacking throughout the night.

## 2019-06-06 NOTE — Progress Notes (Signed)
Sander Nephew                                                                   PROGRESS NOTE                                                                                                                                                                                                             Patient Demographics:    Kelli Mcpherson, is a 24 y.o. female, DOB - 1995-11-16, HQI:[id number]  Outpatient Primary MD for the patient is Ranae Pila, MD   Admit date - 05/29/2019   LOS - 8  No chief complaint on file.      Brief Narrative: Patient is a 24 y.o. female with PMHx of gestational diabetes-on Metformin-who presented with DKA and COVID-19 pneumonia.  Hospitalist service consulted for management of these conditions.  Significant Events: 4/27>> Covid positive 5/2>> ED visit for S OB/nausea/vomiting and lower abdominal pain-given IV fluids and discharged home. 5/4>> ED visit with nausea/vomiting-discharged home 5/9>> admit for COVID-19 pneumonia, DKA, hospitalist service consulted.  COVID-19 medications: Steroids: 5/11>>5/12 (betamethasone for OB indication) Remdesivir: 5/9>>5/13  Antibiotics: None  Microbiology data: 5/9: Urine culture: <10,000 colonies/mL insignificant growth, no group B strep isolated  DVT prophylaxis: SQ Lovenox  Procedures: None   Subjective:   No cough-no shortness of breath.  She did not eat her lunch yesterday-developed hypoglycemia late yesterday evening.   Assessment  & Plan :   Acute Hypoxic Resp Failure due to Covid 19 Viral pneumonia: Hypoxia has resolved-her O2 saturations are persistently above 95%.  She has completed a course of remdesivir-since she is not hypoxic-she does not require steroids for COVID-19.   Fever: afebrile O2 requirements:  SpO2: 99 % O2 Flow Rate (L/min): 2 L/min  Prone/Incentive Spirometry: encouraged incentive spirometry use 3-4/hour.  COVID-19 Labs: No results for input(s): DDIMER, FERRITIN, LDH, CRP in the  last 72 hours.     Component Value Date/Time   BNP 26.1 05/29/2019 1247    No results for input(s): PROCALCITON in the last 168 hours.  Lab Results  Component Value Date   SARSCOV2NAA POSITIVE (A) 05/17/2019    DKA/starvation ketoacidosis (A1c 7.3): Resolved-she was kept on IV insulin-as she received betamethasone-but given that her diet remains erratic/noncompliant at times-and the fact that  she probably has preeclampsia-and needs tight glycemic control-she was continued on IV insulin for a few more days.  Upon patient request on 5/16-we will transition her to subcutaneous insulin-unfortunately she did not eat lunch-as a result probably developed hypoglycemia.  Reviewed CBGs-have spoken with diabetic coordinator RN-changed Levemir to 14 units daily dosing, decreased scheduled Premeal NovoLog to 4 units-remains on SSI.  We will continue to follow and adjust insulin regimen.  Patient counseled regarding importance of not missing meals-also counseled regarding importance of following a diabetic diet.  Hypokalemia: Repleted.  Hypomagnesemia: Repleted  Subclinical hyperthyroidism: Prior physician discussed with endocrinology-recommendations are for outpatient follow-up.  Thyrotropin receptor antibody negative.  SVT:-appreciate cardiology input.  Echo with preserved EF  Mild transaminitis: Likely secondary to COVID-19-continue to monitor-as could be related to underlying pregnancy related complications.  Has been relatively stable for the past several days-continue to follow periodically.  32 weeks intrauterine pregnancy with probable preeclampsia: Deferred to the primary service  Obesity: Estimated body mass index is 36.84 kg/m as calculated from the following:   Height as of this encounter: 6' (1.829 m).   Weight as of this encounter: 123.2 kg.   ABG:    Component Value Date/Time   HCO3 13.7 (L) 05/29/2019 1746   ACIDBASEDEF 11.2 (H) 05/29/2019 1746   O2SAT 88.5 05/29/2019 1746    Vent Settings: N/A  Condition -  Guarded  Family Communication  : Patient prefers to update family herself-I have asked to let me know if family has any Covid related questions.  Code Status :  Full Code  Diet :  Diet Order            Diet gestational carb mod Room service appropriate? Yes; Fluid consistency: Thin  Diet effective now               Disposition Plan  :   Per primary service  Antimicorbials  :    Anti-infectives (From admission, onward)   Start     Dose/Rate Route Frequency Ordered Stop   05/30/19 1000  remdesivir 100 mg in sodium chloride 0.9 % 100 mL IVPB  Status:  Discontinued     100 mg 200 mL/hr over 30 Minutes Intravenous Daily 05/29/19 1345 05/29/19 1347   05/30/19 1000  remdesivir 100 mg in sodium chloride 0.9 % 100 mL IVPB     100 mg 200 mL/hr over 30 Minutes Intravenous Daily 05/29/19 1348 06/02/19 1037   05/29/19 1400  remdesivir 200 mg in sodium chloride 0.9% 250 mL IVPB     200 mg 580 mL/hr over 30 Minutes Intravenous Once 05/29/19 1348 05/29/19 1602   05/29/19 1345  remdesivir 200 mg in sodium chloride 0.9% 250 mL IVPB  Status:  Discontinued     200 mg 580 mL/hr over 30 Minutes Intravenous Once 05/29/19 1345 05/29/19 1347      Inpatient Medications  Scheduled Meds: . albuterol  2 puff Inhalation Q6H  . Chlorhexidine Gluconate Cloth  6 each Topical Daily  . docusate sodium  100 mg Oral Daily  . enoxaparin (LOVENOX) injection  60 mg Subcutaneous Q24H  . insulin aspart  0-16 Units Subcutaneous Q4H  . insulin aspart  4 Units Subcutaneous TID WC  . insulin detemir  14 Units Subcutaneous Daily  . pantoprazole  40 mg Oral Daily  . prenatal multivitamin  1 tablet Oral Q1200  . sodium chloride flush  10-40 mL Intracatheter Q12H  . sodium chloride flush  3 mL Intravenous Q12H   Continuous Infusions: .  albumin human     PRN Meds:.[DISCONTINUED] acetaminophen **OR** acetaminophen, acetaminophen, bisacodyl, calcium carbonate, dextrose,  menthol-cetylpyridinium, ondansetron **OR** ondansetron (ZOFRAN) IV, phenol, polyethylene glycol, promethazine, sodium chloride flush, triamcinolone cream, zolpidem   Time Spent in minutes  25  See all Orders from today for further details   Jeoffrey Massed M.D on 06/06/2019 at 11:20 AM  To page go to [email or web address] - use universal password  [facility] -  Office  [phone]    Objective:   Vitals:   06/06/19 0014 06/06/19 0446 06/06/19 0447 06/06/19 0800  BP: 138/73 138/77  (!) 157/91  Pulse: 97  84 99  Resp: 18 20  18   Temp: 98.4 F (36.9 C) 98.4 F (36.9 C)  97.8 F (36.6 C)  TempSrc: Oral Oral  Oral  SpO2:  98%  99%  Weight:      Height:        Wt Readings from Last 3 Encounters:  05/29/19 123.2 kg  05/21/19 (!) 136.5 kg  05/18/19 (!) 136.5 kg     Intake/Output Summary (Last 24 hours) at 06/06/2019 1120 Last data filed at 06/06/2019 0802 Gross per 24 hour  Intake 1118.88 ml  Output 4550 ml  Net -3431.12 ml     Physical Exam Gen Exam:Alert awake-not in any distress HEENT:atraumatic, normocephalic Chest: B/L clear to auscultation anteriorly CVS:S1S2 regular Abdomen:soft non tender, non distended Extremities:no edema Neurology: Non focal Skin: no rash   Data Review:    CBC Recent Labs  Lab 05/31/19 0510 06/01/19 0314 06/02/19 0020 06/03/19 0443 06/04/19 0915  WBC 7.3 5.6 6.7 6.1 5.4  HGB 11.9* 11.7* 11.8* 9.7* 9.8*  HCT 36.9 35.5* 36.1 30.7* 30.8*  PLT 194 187 204 173 156  MCV 85.6 84.5 85.3 86.5 87.7  MCH 27.6 27.9 27.9 27.3 27.9  MCHC 32.2 33.0 32.7 31.6 31.8  RDW 13.6 13.4 13.5 13.3 13.9  LYMPHSABS 1.1 1.4 1.7 1.7  --   MONOABS 0.6 0.4 0.4 0.7  --   EOSABS 0.1 0.1 0.1 0.0  --   BASOSABS 0.0 0.0 0.0 0.0  --     Chemistries  Recent Labs  Lab 05/30/19 1954 05/30/19 1954 05/31/19 0510 05/31/19 1800 06/02/19 0020 06/02/19 0020 06/03/19 0443 06/03/19 0443 06/03/19 1458 06/03/19 1625 06/03/19 2133 06/04/19 0915  06/05/19 0459  NA 147*   < > 145   < > 140   < > 139   < > 133* 137 135 142 139  K 3.4*   < > 3.3*   < > 3.1*   < > 2.7*   < > 2.6* 3.2* 3.3* 4.0 3.6  CL 122*   < > 118*   < > 111   < > 111   < > 107 111 109 111 112*  CO2 15*   < > 15*   < > 20*   < > 20*   < > 20* 19* 17* 23 21*  GLUCOSE 116*   < > 121*   < > 98   < > 89   < > 565* 91 105* 135* 98  BUN 10   < > 9   < > 7   < > 5*   < > <5* <5* <5* <5* <5*  CREATININE 0.87   < > 0.97   < > 0.86   < > 0.84   < > 0.83 0.85 0.82 0.84 0.64  CALCIUM 10.2   < > 10.0   < > 9.2   < >  8.6*   < > 7.5* 8.6* 8.7* 9.2 8.7*  MG 1.7  --  1.7  --   --   --   --   --   --  1.6*  --  1.4* 1.7  AST  --   --  48*   < > 53*  --  55*  --   --  57*  --  56* 53*  ALT  --   --  51*   < > 53*  --  50*  --   --  55*  --  57* 54*  ALKPHOS  --   --  105   < > 105  --  88  --   --  91  --  99 96  BILITOT  --   --  0.9   < > 0.6  --  0.8  --   --  0.5  --  0.8 0.5   < > = values in this interval not displayed.   ------------------------------------------------------------------------------------------------------------------ No results for input(s): CHOL, HDL, LDLCALC, TRIG, CHOLHDL, LDLDIRECT in the last 72 hours.  Lab Results  Component Value Date   HGBA1C 7.3 (H) 05/29/2019   ------------------------------------------------------------------------------------------------------------------ No results for input(s): TSH, T4TOTAL, T3FREE, THYROIDAB in the last 72 hours.  Invalid input(s): FREET3 ------------------------------------------------------------------------------------------------------------------ No results for input(s): VITAMINB12, FOLATE, FERRITIN, TIBC, IRON, RETICCTPCT in the last 72 hours.  Coagulation profile No results for input(s): INR, PROTIME in the last 168 hours.  No results for input(s): DDIMER in the last 72 hours.  Cardiac Enzymes No results for input(s): CKMB, TROPONINI, MYOGLOBIN in the last 168 hours.  Invalid input(s):  CK ------------------------------------------------------------------------------------------------------------------    Component Value Date/Time   BNP 26.1 05/29/2019 1247    Micro Results Recent Results (from the past 240 hour(s))  OB Urine Culture     Status: Abnormal   Collection Time: 05/29/19 12:08 PM   Specimen: OB Clean Catch; Urine  Result Value Ref Range Status   Specimen Description OB CLEAN CATCH  Final   Special Requests NONE  Final   Culture (A)  Final    <10,000 COLONIES/mL INSIGNIFICANT GROWTH NO GROUP B STREP (S.AGALACTIAE) ISOLATED Performed at [facility], [address], [place], [place] [address]    Report Status 05/30/2019 FINAL  Final    Radiology Reports DG Chest Port 1 View  Result Date: 06/01/2019 CLINICAL DATA:  COVID-19 positive 05/18/2019, PICC placement EXAM: PORTABLE CHEST 1 VIEW COMPARISON:  05/31/2019 at 3:53 p.m. FINDINGS: Single frontal view of the chest again demonstrates left-sided PICC extending over a left-sided mediastinum, in a configuration suggesting persistent left-sided SVC. No significant change since prior study. Cardiac silhouette is stable. Multifocal bilateral airspace disease greatest in the left mid and right lower lung zones unchanged. No effusion or pneumothorax. IMPRESSION: 1. Stable position of the left-sided PICC line, suggesting persistent left-sided superior vena cava. Please correlate with catheter function. 2. Stable multifocal pneumonia. Electronically Signed   By: Sharlet Salina M.D.   On: 06/01/2019 01:13   DG CHEST PORT 1 VIEW  Addendum Date: 05/31/2019   ADDENDUM REPORT: 05/31/2019 16:25 ADDENDUM: It should be noted left SVC typically drains into the coronary sinus and subsequently the right atrium as suggested on recent echocardiogram. Current catheter position does not reach the right atrium and is satisfactory for usage. Electronically Signed   By: Alcide Clever M.D.   On: 05/31/2019 16:25   Result Date:  05/31/2019 CLINICAL DATA:  Status post PICC line  placement EXAM: PORTABLE CHEST 1 VIEW COMPARISON:  05/29/2019 FINDINGS: Cardiac shadow is again mildly prominent. New left-sided PICC line is seen with the catheter tip presenting over the mid cardiac shadow. Recent echocardiography suggested a left superior vena cava and the catheter position with support this diagnosis. The lungs are well aerated bilaterally with patchy opacities consistent with the given clinical history of COVID-19 positivity. No bony abnormality is seen. IMPRESSION: PICC line placement with the catheter tip projecting over the cardiac shadow to the left of the midline likely within a persistent left superior vena cava. Scattered opacities bilaterally consistent with the given clinical history. Electronically Signed: By: Alcide Clever M.D. On: 05/31/2019 16:12   DG CHEST PORT 1 VIEW  Result Date: 05/29/2019 CLINICAL DATA:  Shortness of breath and cough. COVID-19 positive. EXAM: PORTABLE CHEST 1 VIEW COMPARISON:  May 22, 2019 FINDINGS: Cardiomediastinal silhouette is mildly enlarged. Interval development of patchy bilateral airspace opacities with lower lobe predominance. Osseous structures are without acute abnormality. Soft tissues are grossly normal. IMPRESSION: 1. Interval development of patchy bilateral airspace opacities with lower lobe predominance consistent with atypical pneumonia. 2. Enlarged cardiac silhouette. Electronically Signed   By: Ted Mcalpine M.D.   On: 05/29/2019 12:13   DG Chest Port 1 View  Result Date: 05/22/2019 CLINICAL DATA:  Shortness of breath.  COVID-19. EXAM: PORTABLE CHEST 1 VIEW COMPARISON:  08/07/2018 FINDINGS: The heart size and mediastinal contours are within normal limits. Both lungs are clear. The visualized skeletal structures are unremarkable. IMPRESSION: Clear lungs. Electronically Signed   By: Deatra Robinson M.D.   On: 05/22/2019 02:41   [place] MFM FETAL BPP WO NON STRESS  Result Date:  06/03/2019 ----------------------------------------------------------------------  OBSTETRICS REPORT                       (Signed Final 06/03/2019 02:33 pm) ---------------------------------------------------------------------- Patient Info  ID #:       [id number]                          D.O.B.:  02-22-1995 (23 yrs)  Name:       Kelli Mcpherson              Visit Date: 06/03/2019 07:09 am ---------------------------------------------------------------------- Performed By  Attending:        Ma Rings MD         Ref. Address:     [facility]                                                             [address]                                                             [place]                                                             [phone]  Performed By:     Tommie Raymond BS,       Secondary Phy.:   [facility]                    RDMS, RVT                                                             Care  Referred By:      Candice Camp MD          [facility] ---------------------------------------------------------------------- Orders  #  Description                           Code        Ordered By  1  [place] MFM FETAL BPP WO NON               19147.82    DAVID LOWE     STRESS ----------------------------------------------------------------------  #  Order #                     Accession #                Episode #  1  [id number]                   [id number]                 [id number] ---------------------------------------------------------------------- Indications  Gestational diabetes in pregnancy,             O24.415  controlled by oral hypoglycemic drugs, DKA  Oligohydraminios, third trimester, unspecified O41.03X0  [redacted] weeks gestation of pregnancy                Z3A.32   Obesity complicating pregnancy, third          O99.213  trimester  Unspecified maternal hypertension, third       O16.3  trimester  COVID19 (+)  (4/27)  Encounter for other antenatal screening        Z36.2  follow-up ---------------------------------------------------------------------- Fetal Evaluation  Num Of Fetuses:  1  Fetal Heart Rate(bpm):  152  Cardiac Activity:       Observed  Presentation:           Cephalic  Placenta:               Anterior  P. Cord Insertion:      Not well visualized  Amniotic Fluid  AFI FV:      Subjectively low-normal  AFI Sum(cm)     %Tile       Largest Pocket(cm)  9.6             14          3.2  RUQ(cm)       RLQ(cm)       LUQ(cm)        LLQ(cm)  3.2           2.7           1.9            1.8 ---------------------------------------------------------------------- Biophysical Evaluation  Amniotic F.V:   Pocket => 2 cm             F. Tone:        Observed  F. Movement:    Observed                   Score:          8/8  F. Breathing:   Observed ---------------------------------------------------------------------- OB History  Gravidity:    1 ---------------------------------------------------------------------- Gestational Age  LMP:           32w 4d        Date:  10/18/18                 EDD:   07/25/19  Best:          Armida Sans 4d     Det. By:  LMP  (10/18/18)          EDD:   07/25/19 ---------------------------------------------------------------------- Cervix Uterus Adnexa  Cervix  Not visualized (advanced GA >24wks)  Uterus  No abnormality visualized.  Right Ovary  Within normal limits. No adnexal mass visualized.  Left Ovary  Within normal limits. No adnexal mass visualized.  Cul De Sac  No free fluid seen.  Adnexa  No abnormality visualized. ---------------------------------------------------------------------- Comments  This patient has been hospitalized due to diabetic  ketoacidosis which is now resolved.  She has also been  diagnosed with preeclampsia based on her elevated  blood  pressures and 24-hour urine results showing 1.2 g of protein.  Her blood pressures remain in the 130s to 140s over 80s to  90s range.  Oligohydramnios had been noted on her prenatal  ultrasounds earlier this week.  The patient has already  received a complete course of antenatal corticosteroids.  A biophysical profile performed today was 8 out of 8.  The  amniotic fluid level has increased to 9.2 cm now that she is  better hydrated.  Due to preeclampsia with mildly elevated liver function tests  in the 50s range, I would recommend that she continue  inpatient management until delivery.  The patient's liver function tests may not be increasing due to  the effects of the antenatal corticosteroids she recently  received.  I anticipate that her lab work may become more  abnormal once the steroid effects go away.  Usually,  expectant management of severe preeclampsia is successful  in delaying delivery between 5 to 7 days.  She should  continue to be followed with daily PIH labs.  I would recommend delivery should her liver function tests  continue to increase, should her platelet levels decrease, or  should her blood pressures be persistently greater than 150/  100s. ----------------------------------------------------------------------                   Ma Rings, MD Electronically Signed Final Report   06/03/2019 02:33 pm ----------------------------------------------------------------------  [place] MFM FETAL BPP WO NON STRESS  Result Date: 05/31/2019 ----------------------------------------------------------------------  OBSTETRICS REPORT                       (Signed Final 05/31/2019 06:01 pm) ---------------------------------------------------------------------- Patient Info  ID #:       [id number]                          D.O.B.:  1996-01-16 (23 yrs)  Name:       Kelli Mcpherson              Visit Date: 05/31/2019 09:27 am ---------------------------------------------------------------------- Performed By   Attending:        Ma Rings MD         Ref. Address:     [facility]                                                             [address]                                                             [place]                                                             [phone]  Performed By:     Tommie Raymond BS,       Secondary Phy.:   [facility]                    RDMS, RVT  Care  Referred By:      Candice Camp MD          [facility] ---------------------------------------------------------------------- Orders  #  Description                           Code        Ordered By  1  [place] MFM OB DETAIL +14 WK               76811.01    MEGAN MORRIS  2  [place] MFM UA CORD DOPPLER                76820.02    MEGAN MORRIS  3  [place] MFM FETAL BPP WO NON               76819.01    MEGAN MORRIS     STRESS ----------------------------------------------------------------------  #  Order #                     Accession #                Episode #  1  [id number]                   [id number]                 [id number]  2  [id number]                   [id number]                 [id number]  3  [id number]                   [id number]                 [id number] ---------------------------------------------------------------------- Indications  Gestational diabetes in pregnancy,             O24.415  controlled by oral hypoglycemic drugs, DKA  Oligohydraminios, third trimester, unspecified O41.03X0  [redacted] weeks gestation of pregnancy                Z3A.32  Obesity complicating pregnancy, third          O99.213  trimester  Unspecified maternal hypertension, third       O16.3  trimester  COVID19 (+)  (4/27)  Encounter for antenatal screening for          Z36.3   malformations  Encounter for other antenatal screening        Z36.2  follow-up ---------------------------------------------------------------------- Fetal Evaluation  Num Of Fetuses:         1  Fetal Heart Rate(bpm):  142  Cardiac Activity:       Observed  Presentation:           Cephalic  Placenta:               Anterior  P. Cord Insertion:      Not well visualized  Amniotic Fluid  AFI FV:  Oligohydramnios  AFI Sum(cm)     %Tile       Largest Pocket(cm)  5.3             < 3         2.6  RUQ(cm)       RLQ(cm)       LUQ(cm)        LLQ(cm)  1.8           0             0.9            2.6 ---------------------------------------------------------------------- Biophysical Evaluation  Amniotic F.V:   Pocket => 2 cm             F. Tone:        Not Observed  F. Movement:    Observed                   Score:          6/8  F. Breathing:   Observed ---------------------------------------------------------------------- Biometry  BPD:      82.8  mm     G. Age:  33w 2d         76  %    CI:        83.92   %    70 - 86                                                          FL/HC:      21.8   %    19.1 - 21.3  HC:      284.9  mm     G. Age:  31w 2d        4.3  %    HC/AC:      0.98        0.96 - 1.17  AC:       292   mm     G. Age:  33w 1d         79  %    FL/BPD:     75.1   %    71 - 87  FL:       62.2  mm     G. Age:  32w 2d         39  %    FL/AC:      21.3   %    20 - 24  HUM:        57  mm     G. Age:  33w 1d         71  %  CER:      41.3  mm     G. Age:  35w 2d         87  %  LV:        4.5  mm  Est. FW:    2036  gm      4 lb 8 oz     58  % ---------------------------------------------------------------------- OB History  Gravidity:    1 ---------------------------------------------------------------------- Gestational Age  LMP:           32w 1d        Date:  10/18/18  EDD:   07/25/19  U/S Today:     32w 4d                                        EDD:   07/22/19  Best:          32w 1d     Det. By:  LMP   (10/18/18)          EDD:   07/25/19 ---------------------------------------------------------------------- Anatomy  Cranium:               Not well visualized    LVOT:                   Appears normal  Cavum:                 Not well visualized    Aortic Arch:            Not well visualized  Ventricles:            Appears normal         Ductal Arch:            Appears normal  Choroid Plexus:        Appears normal         Diaphragm:              Appears normal  Cerebellum:            Not well visualized    Stomach:                Appears normal, left                                                                        sided  Posterior Fossa:       Not well visualized    Abdomen:                Appears normal  Nuchal Fold:           Not applicable (>20    Abdominal Wall:         Not well visualized                         wks GA)  Face:                  Orbits nl; profile not Cord Vessels:           Appears normal (3                         well visualized                                vessel cord)  Lips:                  Not well visualized    Kidneys:                Appear normal  Palate:  Not well visualized    Bladder:                Appears normal  Thoracic:              Appears normal         Spine:                  Ltd views no                                                                        intracranial signs of                                                                        NTD  Heart:                 Appears normal         Upper Extremities:      Visualized                         (4CH, axis, and                         situs)  RVOT:                  Appears normal         Lower Extremities:      Visualized  Other:  Technically difficult due to maternal habitus and fetal position.          Technically difficult due to low amniotic fluid. ---------------------------------------------------------------------- Doppler - Fetal Vessels  Umbilical Artery   S/D     %tile      RI                PI                     ADFV    RDFV   2.96       67    0.66             0.88                        No      No ---------------------------------------------------------------------- Cervix Uterus Adnexa  Cervix  Not visualized (advanced GA >24wks)  Uterus  No abnormality visualized. ---------------------------------------------------------------------- Comments  This patient has been hospitalized due to diabetic  ketoacidosis and possible preeclampsia.  Her blood  pressures remain in the 120s to 150s over 80s to 90s range.  Her blood glucose levels are now in the normal range and her  diabetic ketoacidosis is resolving.  On today's exam, the overall fetal growth was appropriate for  her gestational age.  Oligohydramnios continues to be noted  today.  Doppler studies of the umbilical arteries performed today  shows a normal S/D ratio.  There were no signs  of absent or  reversed end-diastolic flow.  A biophysical profile performed today was 6 out of 8.  She  received a -2 for absent fetal tone.  Her 24-hour urine results are currently pending.  Based on  her elevated blood pressures and her elevated P/C ratio, she  probably is developing preeclampsia.  Due to probable preeclampsia at her current gestational age,  now that her diabetic ketoacidosis is resolving, I would  recommend that she receive a complete course of antenatal  corticosteroids.  She should be kept on IV insulin while she is  receiving her steroid course.  She should have another ultrasound later this week to  reassess the amniotic fluid levels. ----------------------------------------------------------------------                   Ma Rings, MD Electronically Signed Final Report   05/31/2019 06:01 pm ----------------------------------------------------------------------  [place] MFM FETAL BPP WO NON STRESS  Result Date: 05/30/2019 ----------------------------------------------------------------------  OBSTETRICS REPORT                        (Signed Final 05/30/2019 05:11 pm) ---------------------------------------------------------------------- Patient Info  ID #:       [id number]                          D.O.B.:  1995-07-05 (23 yrs)  Name:       Kelli Mcpherson              Visit Date: 05/30/2019 09:39 am ---------------------------------------------------------------------- Performed By  Attending:        Ma Rings MD         Ref. Address:     [facility]                                                             [address]                                                             [place]  [id number]  Performed By:     Tommie Raymond BS,       Secondary Phy.:   [facility]                    [facility], [facility]T                                                             Care  Referred By:      Candice Camp MD          [facility] ---------------------------------------------------------------------- Orders  #  Description                           Code        Ordered By  1  [place] MFM FETAL BPP WO NON               95188.41    MEGAN MORRIS     STRESS ----------------------------------------------------------------------  #  Order #                     Accession #                Episode #  1  [id number]                   [id number]                 [id number] ---------------------------------------------------------------------- Indications  Oligohydraminios, third trimester, unspecified O41.03X0  Gestational diabetes in pregnancy,             O24.415  controlled by oral hypoglycemic drugs, DKA  Obesity complicating pregnancy, third          O99.213  trimester  Unspecified maternal hypertension, third       O16.3  trimester  COVID19 (+)  (4/27)  [redacted] weeks gestation of  pregnancy                Z3A.31 ---------------------------------------------------------------------- Fetal Evaluation  Num Of Fetuses:         1  Fetal Heart Rate(bpm):  166  Cardiac Activity:       Observed  Presentation:           Cephalic  Placenta:               Anterior  P. Cord Insertion:      Not well visualized  Amniotic Fluid  AFI FV:      Oligohydramnios  AFI Sum(cm)     %Tile       Largest Pocket(cm)  2.2             < 3         1.1  RUQ(cm)       RLQ(cm)       LUQ(cm)  LLQ(cm)  1.1           0             1.1            0 ---------------------------------------------------------------------- Biophysical Evaluation  Amniotic F.V:   Oligohydramnios            F. Tone:        Observed  F. Movement:    Not Observed               Score:          4/8  F. Breathing:   Observed ---------------------------------------------------------------------- OB History  Gravidity:    1 ---------------------------------------------------------------------- Gestational Age  LMP:           32w 0d        Date:  10/18/18                 EDD:   07/25/19  Best:          Armida Sans 0d     Det. By:  LMP  (10/18/18)          EDD:   07/25/19 ---------------------------------------------------------------------- Anatomy  Diaphragm:             Appears normal         Kidneys:                Appear normal  Stomach:               Appears normal, left   Bladder:                Appears normal                         sided  Other:  Technically difficult due to low amniotic fluid. Technically difficult due          to maternal habitus and fetal position. ---------------------------------------------------------------------- Cervix Uterus Adnexa  Cervix  Not visualized (advanced GA >24wks)  Uterus  No abnormality visualized.  Right Ovary  Within normal limits. No adnexal mass visualized.  Left Ovary  Within normal limits. No adnexal mass visualized.  Cul De Sac  No free fluid seen.  Adnexa  No abnormality visualized.  ---------------------------------------------------------------------- Comments  This patient has been hospitalized due to diabetic  ketoacidosis and persistent nausea and vomiting.  She also  tested positive for COVID-19 about 2 weeks ago.  She is  currently being treated with remdesivir for Covid pneumonia.  She has been receiving IV hydration and IV insulin to help  resolve the diabetic ketoacidosis.  This morning, her fetal heart rate tracing has improved.  There are periods of variability and accelerations.  No further  decelerations are noted.  The patient is also more alert and  awake.  Her labs show hypernatremia with a sodium level of 150,  serum creatinine of 1.03, and AST and ALT that are mildly  elevated (51).  Her fingerstick values are now in the 110s to  120s range.  Her blood pressures remain in the 140s over  80s range.  A biophysical profile performed today was 4 out of 8.  The  patient received -2 for absent fetal movements and also -2 for  anhydramnios.  I reviewed the patient's ultrasound images from yesterday.  A  >2 cm pocket of amniotic fluid was noted yesterday.  Due to her symptoms of persistent nausea and vomiting, her  elevated blood pressures,  her elevated liver function tests,  and her elevated serum creatinine level, a diagnosis of  preeclampsia has not been ruled out.  A P/C ratio performed this afternoon was 0.86 (elevated).  A  24-hour urine collection is underway to assess the total  protein.  A course of antenatal corticosteroids for fetal lung maturity  has been held due to concerns that it may elevate her blood  glucose levels and further worsen her diabetic ketoacidosis.  The patient should be kept on continuous monitoring  overnight.  I would recommend delivery should the fetal  status be nonreassuring.  Should her diabetic ketoacidosis continue to resolve and her  blood pressures remain elevated therefore increasing the  suspicion for preeclampsia, a course of antenatal   corticosteroids should be administered tomorrow morning.  The patient will continue to receive IV hydration overnight.  We will repeat another ultrasound tomorrow to assess the  amniotic fluid levels, the fetal growth, and umbilical artery  Doppler studies. ----------------------------------------------------------------------                   Ma Rings, MD Electronically Signed Final Report   05/30/2019 05:11 pm ----------------------------------------------------------------------  [place] MFM FETAL BPP WO NON STRESS  Result Date: 05/29/2019 ----------------------------------------------------------------------  OBSTETRICS REPORT                       (Signed Final 05/29/2019 12:15 pm) ---------------------------------------------------------------------- Patient Info  ID #:       [id number]                          D.O.B.:  1995-05-29 (23 yrs)  Name:       Kelli Mcpherson              Visit Date: 05/29/2019 09:58 am ---------------------------------------------------------------------- Performed By  Attending:        Lin Landsman      Ref. Address:      [facility]                                                              [address]  [place]                                                              [address]  Performed By:     Birdena Crandall        [facility]  Referred By:      Candice Camp MD ---------------------------------------------------------------------- Orders  #  Description                           Code        Ordered By  1  [place] MFM FETAL BPP WO NON               60454.09    DAVID LOWE     STRESS ----------------------------------------------------------------------  #   Order #                     Accession #                Episode #  1  [id number]                   [id number]                 [id number] ---------------------------------------------------------------------- Indications  Gestational diabetes in pregnancy,              O24.415  controlled by oral hypoglycemic drugs, DKA  Obesity complicating pregnancy, third           O99.213  trimester  Unspecified maternal hypertension, third        O16.3  trimester  COVID19 (+)  (4/27)  Oligohydraminios, third trimester, unspecified  O41.03X0  [redacted] weeks gestation of pregnancy                 Z3A.31 ---------------------------------------------------------------------- Fetal Evaluation  Num Of Fetuses:          1  Fetal Heart Rate(bpm):   150  Cardiac Activity:        Observed  Presentation:            Cephalic  Placenta:                Anterior  Amniotic Fluid  AFI FV:      Within normal limits  AFI Sum(cm)     %Tile       Largest Pocket(cm)  9.5             12          2.8  RUQ(cm)       RLQ(cm)       LUQ(cm)        LLQ(cm)  2.8           2.5           2.8            1.4 ---------------------------------------------------------------------- Biophysical Evaluation  Amniotic F.V:   Within normal limits       F. Tone:         Observed  F. Movement:    Not Observed               Score:           4/8  F. Breathing:   Not Observed ---------------------------------------------------------------------- OB History  Gravidity:    1 ---------------------------------------------------------------------- Gestational Age  LMP:           31w 6d        Date:  10/18/18                 EDD:   07/25/19  Best:          31w 6d     Det. By:  LMP  (10/18/18)          EDD:   07/25/19 ---------------------------------------------------------------------- Anatomy  Stomach:               Appears normal, left   Kidneys:                Appear normal                         sided  Abdominal Wall:        Appears nml (cord      Bladder:                Appears  normal                         insert, abd wall) ---------------------------------------------------------------------- Impression  Limited exam due to presumed maternal DKA  Biophysical profile 4 out of 8 there is some movement but not  enough to pass the BPP.  Labs are pending with continued fetal monitoring.  I discussed with Dr. Rana Snare the current plan and agree. ---------------------------------------------------------------------- Recommendations  Continueed Fetal monitoring  Hold Betamethsone  DKA protocol including insulin drip and fluid hydration with  the goal of blood sugars 100-140's.  Will await labs to perform consultation and diagnosis of DKA.  Continue COVID protocol. ----------------------------------------------------------------------               Lin Landsman, MD Electronically Signed Final Report   05/29/2019 12:15 pm ----------------------------------------------------------------------  [place] MFM OB DETAIL +14 WK  Result Date: 05/31/2019 ----------------------------------------------------------------------  OBSTETRICS REPORT                       (Signed Final 05/31/2019 06:01 pm) ---------------------------------------------------------------------- Patient Info  ID #:       [id number]                          D.O.B.:  1995-08-08 (23 yrs)  Name:       Kelli Mcpherson              Visit Date: 05/31/2019 09:27 am ---------------------------------------------------------------------- Performed By  Attending:        Ma Rings MD         Ref. Address:     [facility]                                                             [address]                                                             [place]                                                             [address]  Performed By:     Tommie Raymond BS,       Secondary Phy.:   [facility]                     RD[facility], RVT                                                             Care  Referred By:      Candice Camp MD          [facility] ---------------------------------------------------------------------- Orders  #  Description                           Code        Ordered By  1  [place] MFM OB DETAIL +14 WK               76811.01    MEGAN MORRIS  2  [place] MFM UA CORD DOPPLER                76820.02    MEGAN MORRIS  3  [place] MFM FETAL BPP WO NON               60454.09    MEGAN MORRIS     STRESS ----------------------------------------------------------------------  #  Order #  Accession #                Episode #  1  [id number]                   [id number]                 [id number]  2  [id number]                   [id number]                 [id number]  3  [id number]                   [id number]                 [id number] ---------------------------------------------------------------------- Indications  Gestational diabetes in pregnancy,             O24.415  controlled by oral hypoglycemic drugs, DKA  Oligohydraminios, third trimester, unspecified O41.03X0  [redacted] weeks gestation of pregnancy                Z3A.32  Obesity complicating pregnancy, third          O99.213  trimester  Unspecified maternal hypertension, third       O16.3  trimester  COVID19 (+)  (4/27)  Encounter for antenatal screening for          Z36.3  malformations  Encounter for other antenatal screening        Z36.2  follow-up ---------------------------------------------------------------------- Fetal Evaluation  Num Of Fetuses:         1  Fetal Heart Rate(bpm):  142  Cardiac Activity:       Observed  Presentation:           Cephalic  Placenta:               Anterior  P. Cord Insertion:      Not well visualized  Amniotic Fluid  AFI FV:      Oligohydramnios  AFI Sum(cm)     %Tile       Largest Pocket(cm)  5.3             < 3         2.6   RUQ(cm)       RLQ(cm)       LUQ(cm)        LLQ(cm)  1.8           0             0.9            2.6 ---------------------------------------------------------------------- Biophysical Evaluation  Amniotic F.V:   Pocket => 2 cm             F. Tone:        Not Observed  F. Movement:    Observed                   Score:          6/8  F. Breathing:   Observed ---------------------------------------------------------------------- Biometry  BPD:      82.8  mm     G. Age:  33w 2d         76  %    CI:        83.92   %    70 - 86  FL/HC:      21.8   %    19.1 - 21.3  HC:      284.9  mm     G. Age:  31w 2d        4.3  %    HC/AC:      0.98        0.96 - 1.17  AC:       292   mm     G. Age:  33w 1d         79  %    FL/BPD:     75.1   %    71 - 87  FL:       62.2  mm     G. Age:  32w 2d         39  %    FL/AC:      21.3   %    20 - 24  HUM:        57  mm     G. Age:  33w 1d         71  %  CER:      41.3  mm     G. Age:  35w 2d         87  %  LV:        4.5  mm  Est. FW:    2036  gm      4 lb 8 oz     58  % ---------------------------------------------------------------------- OB History  Gravidity:    1 ---------------------------------------------------------------------- Gestational Age  LMP:           32w 1d        Date:  10/18/18                 EDD:   07/25/19  U/S Today:     32w 4d                                        EDD:   07/22/19  Best:          32w 1d     Det. By:  LMP  (10/18/18)          EDD:   07/25/19 ---------------------------------------------------------------------- Anatomy  Cranium:               Not well visualized    LVOT:                   Appears normal  Cavum:                 Not well visualized    Aortic Arch:            Not well visualized  Ventricles:            Appears normal         Ductal Arch:            Appears normal  Choroid Plexus:        Appears normal         Diaphragm:              Appears normal  Cerebellum:            Not well  visualized    Stomach:  Appears normal, left                                                                        sided  Posterior Fossa:       Not well visualized    Abdomen:                Appears normal  Nuchal Fold:           Not applicable (>20    Abdominal Wall:         Not well visualized                         wks GA)  Face:                  Orbits nl; profile not Cord Vessels:           Appears normal (3                         well visualized                                vessel cord)  Lips:                  Not well visualized    Kidneys:                Appear normal  Palate:                Not well visualized    Bladder:                Appears normal  Thoracic:              Appears normal         Spine:                  Ltd views no                                                                        intracranial signs of                                                                        NTD  Heart:                 Appears normal         Upper Extremities:      Visualized                         (4CH,  axis, and                         situs)  RVOT:                  Appears normal         Lower Extremities:      Visualized  Other:  Technically difficult due to maternal habitus and fetal position.          Technically difficult due to low amniotic fluid. ---------------------------------------------------------------------- Doppler - Fetal Vessels  Umbilical Artery   S/D     %tile      RI               PI                     ADFV    RDFV   2.96       67    0.66             0.88                        No      No ---------------------------------------------------------------------- Cervix Uterus Adnexa  Cervix  Not visualized (advanced GA >24wks)  Uterus  No abnormality visualized. ---------------------------------------------------------------------- Comments  This patient has been hospitalized due to diabetic  ketoacidosis and possible preeclampsia.  Her blood  pressures remain in  the 120s to 150s over 80s to 90s range.  Her blood glucose levels are now in the normal range and her  diabetic ketoacidosis is resolving.  On today's exam, the overall fetal growth was appropriate for  her gestational age.  Oligohydramnios continues to be noted  today.  Doppler studies of the umbilical arteries performed today  shows a normal S/D ratio.  There were no signs of absent or  reversed end-diastolic flow.  A biophysical profile performed today was 6 out of 8.  She  received a -2 for absent fetal tone.  Her 24-hour urine results are currently pending.  Based on  her elevated blood pressures and her elevated P/C ratio, she  probably is developing preeclampsia.  Due to probable preeclampsia at her current gestational age,  now that her diabetic ketoacidosis is resolving, I would  recommend that she receive a complete course of antenatal  corticosteroids.  She should be kept on IV insulin while she is  receiving her steroid course.  She should have another ultrasound later this week to  reassess the amniotic fluid levels. ----------------------------------------------------------------------                   Ma Rings, MD Electronically Signed Final Report   05/31/2019 06:01 pm ----------------------------------------------------------------------  ECHOCARDIOGRAM COMPLETE  Result Date: 05/31/2019    ECHOCARDIOGRAM REPORT   Patient Name:   Kelli Mcpherson Date of Exam: 05/31/2019 Medical Rec #:  [id number]          Height:       72.0 in Accession #:    [id number]         Weight:       271.6 lb Date of Birth:  [birth year]          BSA:          2.427 m Patient Age:    23 years           BP:           133/68  mmHg Patient Gender: F                  HR:           109 bpm. Exam Location:  Inpatient Procedure: 2D Echo, Color Doppler and Cardiac Doppler Indications:    I47.1 SVT  History:        Patient has no prior history of Echocardiogram examinations.                 COVID+ 05/17/19                 [redacted]  Weeks Pregnant at time of study.  Sonographer:    Raquel Sarna Senior RDCS Referring Phys: [id number] DAWOOD S [facility]  Sonographer Comments: Technically difficult due to patient body habitus. IMPRESSIONS  1. Left ventricular ejection fraction, by estimation, is 50 to 55%. The left ventricle has low normal function. The left ventricle demonstrates global hypokinesis. Indeterminate diastolic filling due to E-A fusion. There is abnormal (paradoxical) septal  motion, consistent with right ventricular volume overload.  2. Right ventricular systolic function is normal. The right ventricular size is normal. Tricuspid regurgitation signal is inadequate for assessing PA pressure.  3. 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 The inferior vena cava is also dilated, but the right heart chambers are not enlarged.  4. The mitral valve is normal in structure. No evidence of mitral valve regurgitation. No evidence of mitral stenosis.  5. The aortic valve is normal in structure. Aortic valve regurgitation is not visualized. No aortic stenosis is present.  6. The inferior vena cava is dilated in size with <50% respiratory variability, suggesting right atrial pressure of 15 mmHg. Comparison(s): No prior Echocardiogram. Conclusion(s)/Recommendation(s): Recommend a saline contrast injection from a vein in the left upper extremity, with imaging of the coronary sinus. FINDINGS  Left Ventricle: Left ventricular ejection fraction, by estimation, is 50 to 55%. The left ventricle has low normal function. The left ventricle demonstrates global hypokinesis. The left ventricular internal cavity size was normal in size. There is no left ventricular hypertrophy. Abnormal (paradoxical) septal motion, consistent with right ventricular volume overload. Indeterminate diastolic filling due to E-A fusion. Right Ventricle: The right ventricular size is normal. No increase in right ventricular wall thickness. Right  ventricular systolic function is normal. Tricuspid regurgitation signal is inadequate for assessing PA pressure. Left Atrium: Left atrial size was normal in size. 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 The inferior vena cava is also dilated, but the right heart chambers are not enlarged. Right atrial size was normal in size. Pericardium: There is no evidence of pericardial effusion. Mitral Valve: The mitral valve is normal in structure. Normal mobility of the mitral valve leaflets. No evidence of mitral valve regurgitation. No evidence of mitral valve stenosis. Tricuspid Valve: The tricuspid valve is normal in structure. Tricuspid valve regurgitation is not demonstrated. No evidence of tricuspid stenosis. Aortic Valve: The aortic valve is normal in structure. Aortic valve regurgitation is not visualized. No aortic stenosis is present. Pulmonic Valve: The pulmonic valve was normal in structure. Pulmonic valve regurgitation is not visualized. No evidence of pulmonic stenosis. Aorta: The aortic root is normal in size and structure. Venous: The inferior vena cava is dilated in size with less than 50% respiratory variability, suggesting right atrial pressure of 15 mmHg. IAS/Shunts: No atrial level shunt detected by color flow Doppler.  LEFT VENTRICLE PLAX 2D LVIDd:  5.00 cm LVIDs:         3.80 cm LV PW:         1.10 cm LV IVS:        1.40 cm LVOT diam:     2.00 cm LV SV:         56 LV SV Index:   23 LVOT Area:     3.14 cm  RIGHT VENTRICLE RV S prime:     12.20 cm/s TAPSE (M-mode): 1.8 cm LEFT ATRIUM           Index       RIGHT ATRIUM           Index LA diam:      3.20 cm 1.32 cm/m  RA Area:     17.60 cm LA Vol (A2C): 45.3 ml 18.67 ml/m RA Volume:   51.10 ml  21.06 ml/m LA Vol (A4C): 47.7 ml 19.66 ml/m  AORTIC VALVE LVOT Vmax:   104.00 cm/s LVOT Vmean:  79.700 cm/s LVOT VTI:    0.179 m  AORTA Ao Root diam: 3.30 cm Ao Asc diam:  2.80 cm  SHUNTS  Systemic VTI:  0.18 m Systemic Diam: 2.00 cm Thurmon Fair MD Electronically signed by Thurmon Fair MD Signature Date/Time: 05/31/2019/9:09:24 AM    Final    [place] EKG SITE RITE  Result Date: 05/31/2019 If Site Rite image not attached, placement could not be confirmed due to current cardiac rhythm.  [place] MFM UA CORD DOPPLER  Result Date: 05/31/2019 ----------------------------------------------------------------------  OBSTETRICS REPORT                       (Signed Final 05/31/2019 06:01 pm) ---------------------------------------------------------------------- Patient Info  ID #:       [id number]                          D.O.B.:  15-Mar-1995 (23 yrs)  Name:       Kelli Mcpherson              Visit Date: 05/31/2019 09:27 am ---------------------------------------------------------------------- Performed By  Attending:        Ma Rings MD         Ref. Address:     [facility]                                                             [address]  Jacky Kindle                                                             [phone]  Performed By:     Tommie Raymond BS,       Secondary Phy.:   [facility]                    RDMS, RVT                                                             Care  Referred By:      Candice Camp MD          [facility] ---------------------------------------------------------------------- Orders  #  Description                           Code        Ordered By  1  [place] MFM OB DETAIL +14 WK               76811.01    MEGAN MORRIS  2  [place] MFM UA CORD DOPPLER                76820.02    MEGAN MORRIS  3  [place] MFM FETAL BPP WO NON               76819.01    MEGAN MORRIS     STRESS  ----------------------------------------------------------------------  #  Order #                     Accession #                Episode #  1  [id number]                   [id number]  2  [id number]                   [id number]  3  [id number]                   [id number]                 [id number] ---------------------------------------------------------------------- Indications  Gestational diabetes in pregnancy,             O24.415  controlled by oral hypoglycemic drugs, DKA  Oligohydraminios, third trimester, unspecified O41.03X0  [redacted] weeks gestation of pregnancy  Z3A.32  Obesity complicating pregnancy, third          O99.213  trimester  Unspecified maternal hypertension, third       O16.3  trimester  COVID19 (+)  (4/27)  Encounter for antenatal screening for          Z36.3  malformations  Encounter for other antenatal screening        Z36.2  follow-up ---------------------------------------------------------------------- Fetal Evaluation  Num Of Fetuses:         1  Fetal Heart Rate(bpm):  142  Cardiac Activity:       Observed  Presentation:           Cephalic  Placenta:               Anterior  P. Cord Insertion:      Not well visualized  Amniotic Fluid  AFI FV:      Oligohydramnios  AFI Sum(cm)     %Tile       Largest Pocket(cm)  5.3             < 3         2.6  RUQ(cm)       RLQ(cm)       LUQ(cm)        LLQ(cm)  1.8           0             0.9            2.6 ---------------------------------------------------------------------- Biophysical Evaluation  Amniotic F.V:   Pocket => 2 cm             F. Tone:        Not Observed  F. Movement:    Observed                   Score:          6/8  F. Breathing:   Observed ---------------------------------------------------------------------- Biometry  BPD:      82.8  mm     G. Age:  33w 2d         76  %    CI:        83.92   %    70 - 86                                                          FL/HC:      21.8    %    19.1 - 21.3  HC:      284.9  mm     G. Age:  31w 2d        4.3  %    HC/AC:      0.98        0.96 - 1.17  AC:       292   mm     G. Age:  33w 1d         79  %    FL/BPD:     75.1   %    71 - 87  FL:       62.2  mm     G. Age:  32w 2d         39  %    FL/AC:  21.3   %    20 - 24  HUM:        57  mm     G. Age:  33w 1d         71  %  CER:      41.3  mm     G. Age:  35w 2d         87  %  LV:        4.5  mm  Est. FW:    2036  gm      4 lb 8 oz     58  % ---------------------------------------------------------------------- OB History  Gravidity:    1 ---------------------------------------------------------------------- Gestational Age  LMP:           32w 1d        Date:  10/18/18                 EDD:   07/25/19  U/S Today:     32w 4d                                        EDD:   07/22/19  Best:          32w 1d     Det. By:  LMP  (10/18/18)          EDD:   07/25/19 ---------------------------------------------------------------------- Anatomy  Cranium:               Not well visualized    LVOT:                   Appears normal  Cavum:                 Not well visualized    Aortic Arch:            Not well visualized  Ventricles:            Appears normal         Ductal Arch:            Appears normal  Choroid Plexus:        Appears normal         Diaphragm:              Appears normal  Cerebellum:            Not well visualized    Stomach:                Appears normal, left                                                                        sided  Posterior Fossa:       Not well visualized    Abdomen:                Appears normal  Nuchal Fold:           Not applicable (>20    Abdominal Wall:         Not well visualized  wks GA)  Face:                  Orbits nl; profile not Cord Vessels:           Appears normal (3                         well visualized                                vessel cord)  Lips:                  Not well visualized    Kidneys:                Appear normal   Palate:                Not well visualized    Bladder:                Appears normal  Thoracic:              Appears normal         Spine:                  Ltd views no                                                                        intracranial signs of                                                                        NTD  Heart:                 Appears normal         Upper Extremities:      Visualized                         (4CH, axis, and                         situs)  RVOT:                  Appears normal         Lower Extremities:      Visualized  Other:  Technically difficult due to maternal habitus and fetal position.          Technically difficult due to low amniotic fluid. ---------------------------------------------------------------------- Doppler - Fetal Vessels  Umbilical Artery   S/D     %tile      RI               PI                     ADFV    RDFV   2.96  67    0.66             0.88                        No      No ---------------------------------------------------------------------- Cervix Uterus Adnexa  Cervix  Not visualized (advanced GA >24wks)  Uterus  No abnormality visualized. ---------------------------------------------------------------------- Comments  This patient has been hospitalized due to diabetic  ketoacidosis and possible preeclampsia.  Her blood  pressures remain in the 120s to 150s over 80s to 90s range.  Her blood glucose levels are now in the normal range and her  diabetic ketoacidosis is resolving.  On today's exam, the overall fetal growth was appropriate for  her gestational age.  Oligohydramnios continues to be noted  today.  Doppler studies of the umbilical arteries performed today  shows a normal S/D ratio.  There were no signs of absent or  reversed end-diastolic flow.  A biophysical profile performed today was 6 out of 8.  She  received a -2 for absent fetal tone.  Her 24-hour urine results are currently pending.  Based on  her elevated blood  pressures and her elevated P/C ratio, she  probably is developing preeclampsia.  Due to probable preeclampsia at her current gestational age,  now that her diabetic ketoacidosis is resolving, I would  recommend that she receive a complete course of antenatal  corticosteroids.  She should be kept on IV insulin while she is  receiving her steroid course.  She should have another ultrasound later this week to  reassess the amniotic fluid levels. ----------------------------------------------------------------------                   Ma Rings, MD Electronically Signed Final Report   05/31/2019 06:01 pm ----------------------------------------------------------------------

## 2019-06-06 NOTE — Progress Notes (Signed)
Hypoglycemic Event  CBG: 51  Treatment: pt drank 4oz of juice upon on sent of lightheadedness-10 minutes prior to calling RN. Another 4 oz juice given at 0936  Symptoms: lightheaded   Follow-up CBG: [id number] CBG Result:53, 8oz juice given Possible Reasons for Event:   Comments/MD notified:    Tacy Dura

## 2019-06-06 NOTE — Progress Notes (Addendum)
Kelli Mcpherson is a 24 y.o. female patient. Doing well. Reports falling asleep yesterday and forgetting to eat. Has a good plan today for meals. Her mother has DM and requires insulin. Her and her mother have been making a plan via the phone. After Kelli Mcpherson is discharged she plans to walk often with her mother and brother. Baby is to be named after her brother. Denies SOB/CP/swelling/contractions/LOF. Has + FM. Had a mild HA earlier.    1. COVID-19   2. DKA (diabetic ketoacidoses) ([place])   3. Diabetic keto-acidosis ([place])   4. Diabetic ketoacidosis without coma associated with diabetes mellitus due to underlying condition ([place])   5. COVID-19 affecting pregnancy in third trimester   6. PICC (peripherally inserted central catheter) in place   7. Oligohydramnios   7. Probable Preeclampsia   Past Medical History:  Diagnosis Date  . Chlamydia   . COVID-19   . Eczema   . Gonorrhea     Current Facility-Administered Medications  Medication Dose Route Frequency Provider Last Rate Last Admin  . acetaminophen (TYLENOL) suppository 650 mg  650 mg Rectal Q6H PRN Karmen Bongo, MD      . acetaminophen (TYLENOL) tablet 650 mg  650 mg Oral Q4H PRN Louretta Shorten, MD   650 mg at 06/05/19 1723  . albumin human 5 % solution 12.5 g  12.5 g Intravenous Once Lang Snow, FNP      . albuterol (VENTOLIN HFA) 108 (90 Base) MCG/ACT inhaler 2 puff  2 puff Inhalation Q6H Karmen Bongo, MD   2 puff at 06/06/19 [phone]  . bisacodyl (DULCOLAX) EC tablet 5 mg  5 mg Oral Daily PRN Karmen Bongo, MD      . calcium carbonate (TUMS - dosed in mg elemental calcium) chewable tablet 400 mg of elemental calcium  2 tablet Oral Q4H PRN Louretta Shorten, MD   400 mg of elemental calcium at 06/05/19 0637  . Chlorhexidine Gluconate Cloth 2 % PADS 6 each  6 each Topical Daily Elgergawy, Silver Huguenin, MD   6 each at 06/05/19 0802  . dextrose 50 % solution 0-50 mL  0-50 mL Intravenous PRN Karmen Bongo, MD      . docusate sodium (COLACE)  capsule 100 mg  100 mg Oral Daily Louretta Shorten, MD   100 mg at 06/06/19 0816  . enoxaparin (LOVENOX) injection 60 mg  60 mg Subcutaneous Q24H Louretta Shorten, MD   60 mg at 06/05/19 1724  . insulin aspart (novoLOG) injection 0-16 Units  0-16 Units Subcutaneous Q4H Jonetta Osgood, MD   1 Units at 06/05/19 1638  . insulin aspart (novoLOG) injection 7 Units  7 Units Subcutaneous TID WC Jonetta Osgood, MD   7 Units at 06/06/19 [phone]  . insulin detemir (LEVEMIR) injection 14 Units  14 Units Subcutaneous BID Jonetta Osgood, MD   14 Units at 06/05/19 1033  . menthol-cetylpyridinium (CEPACOL) lozenge 3 mg  1 lozenge Oral PRN Tyson Dense, MD   3 mg at 06/05/19 0911  . ondansetron (ZOFRAN) tablet 4 mg  4 mg Oral Q6H PRN Karmen Bongo, MD   4 mg at 06/03/19 0506   Or  . ondansetron [facility]) injection 4 mg  4 mg Intravenous Q6H PRN Karmen Bongo, MD   4 mg at 05/31/19 2153  . pantoprazole (PROTONIX) EC tablet 40 mg  40 mg Oral Daily Everlene Farrier, MD   40 mg at 06/05/19 1034  . phenol (CHLORASEPTIC) mouth spray 1 spray  1 spray Mouth/Throat  PRN Candice Camp, MD   1 spray at 06/02/19 1741  . polyethylene glycol (MIRALAX / GLYCOLAX) packet 17 g  17 g Oral Daily PRN Jonah Blue, MD      . prenatal multivitamin tablet 1 tablet  1 tablet Oral Q1200 Candice Camp, MD   1 tablet at 06/06/19 0816  . promethazine (PHENERGAN) suppository 12.5-25 mg  12.5-25 mg Rectal Q6H PRN Jonah Blue, MD      . sodium chloride flush (NS) 0.9 % injection 10-40 mL  10-40 mL Intracatheter Q12H Elgergawy, Leana Roe, MD   10 mL at 06/05/19 2238  . sodium chloride flush (NS) 0.9 % injection 10-40 mL  10-40 mL Intracatheter PRN Elgergawy, Leana Roe, MD      . sodium chloride flush (NS) 0.9 % injection 3 mL  3 mL Intravenous Q12H Jonah Blue, MD   3 mL at 06/05/19 2237  . triamcinolone cream (KENALOG) 0.1 %   Topical BID PRN Ranae Pila, MD   Given at 06/03/19 0036  . zolpidem (AMBIEN) tablet 5 mg  5 mg  Oral QHS PRN Candice Camp, MD       No Known Allergies Principal Problem:   COVID-19 affecting pregnancy in third trimester Active Problems:   Obesity   DKA (diabetic ketoacidosis) ([facility])  Blood pressure (!) 157/91, pulse 99, temperature 97.8 F (36.6 C), temperature source Oral, resp. rate 18, height 6' (1.829 m), weight 123.2 kg, last menstrual period 07/31/2018, SpO2 99 %.  Review of Systems  Physical Exam  A&O, up walking around, smiling, conversive NWOB Reg rate on exam earlier Abd gravid nontender No blood on mesh underwear  A/P: 24 yo G1P0 @ 33.0 wga originally presented with DKA and COVID now recovering and with incidentally found PIH without severe features.   # PIH: no severe features. This AM, one isolated 157/91 - continue daily labs which remain stable - ctm closely - Magnesium IV prn development of severe features - MFM recs delivery "should her liver function tests continue to increase, should her platelet levels decrease, or should her blood pressures be persistently greater than 150/ 100s" - This am, we reviewed plan for inpatient management until delivery and we discussed indications for delivery. We reviewed plan for IOL but that she may require a CS depending on circumstance.   FWB:  - BPP 8/8 5/14 with NORMAL fluid - Twice weekly BPP planned, ordered today - Continue NST q shift - BMZ complete as of 5/14 (given 5/12 and 5/13) - rescue dose prn    DKA : resolved - Management per IM and DM care - Labs improving - IM plans to change insulin today - She is becoming more aware and motivated for appropriate meals an ddiet   COVID +(April 27) - Management per IM - s/p Remdesivir and sterioids - consider taking off covid precautions prn IM  Proph: SQ lovenox, defer baby asa while on lovenox    Ranae Pila 06/06/2019

## 2019-06-06 NOTE — Progress Notes (Signed)
Pt's CBG upon assessment was 66. Pt states they had felt lightheaded before RN walked into room. CBG treated with 4oz juice (up to 77) and insulin held. Dr. Henderson Cloud notified. Plan to reassess pt at 4am and treat hypoglycemia when needed. Explained POC to pt. Pt agreeable.

## 2019-06-07 LAB — COMPREHENSIVE METABOLIC PANEL
ALT: 62 U/L — ABNORMAL HIGH (ref 0–44)
AST: 67 U/L — ABNORMAL HIGH (ref 15–41)
Albumin: 2 g/dL — ABNORMAL LOW (ref 3.5–5.0)
Alkaline Phosphatase: 93 U/L (ref 38–126)
Anion gap: 8 (ref 5–15)
BUN: <5 mg/dL — ABNORMAL LOW (ref 6–20)
CO2: 26 mmol/L (ref 22–32)
Calcium: 8.9 mg/dL (ref 8.9–10.3)
Chloride: 104 mmol/L (ref 98–111)
Creatinine, Ser: 0.76 mg/dL (ref 0.44–1.00)
GFR calc Af Amer: >60 mL/min (ref >60)
GFR calc non Af Amer: >60 mL/min (ref >60)
Glucose, Bld: 87 mg/dL (ref 70–99)
Potassium: 3.8 mmol/L (ref 3.5–5.1)
Sodium: 138 mmol/L (ref 135–145)
Total Bilirubin: 0.5 mg/dL (ref 0.3–1.2)
Total Protein: 5.7 g/dL — ABNORMAL LOW (ref 6.5–8.1)

## 2019-06-07 LAB — GLUCOSE, CAPILLARY
Glucose-Capillary: 117 mg/dL — ABNORMAL HIGH (ref 70–99)
Glucose-Capillary: 117 mg/dL — ABNORMAL HIGH (ref 70–99)
Glucose-Capillary: 119 mg/dL — ABNORMAL HIGH (ref 70–99)
Glucose-Capillary: 125 mg/dL — ABNORMAL HIGH (ref 70–99)
Glucose-Capillary: 62 mg/dL — ABNORMAL LOW (ref 70–99)
Glucose-Capillary: 66 mg/dL — ABNORMAL LOW (ref 70–99)
Glucose-Capillary: 70 mg/dL (ref 70–99)
Glucose-Capillary: 70 mg/dL (ref 70–99)
Glucose-Capillary: 80 mg/dL (ref 70–99)
Glucose-Capillary: 96 mg/dL (ref 70–99)

## 2019-06-07 LAB — CBC
HCT: 30.7 % — ABNORMAL LOW (ref 36.0–46.0)
Hemoglobin: 9.7 g/dL — ABNORMAL LOW (ref 12.0–15.0)
MCH: 28 pg (ref 26.0–34.0)
MCHC: 31.6 g/dL (ref 30.0–36.0)
MCV: 88.7 fL (ref 80.0–100.0)
Platelets: 139 10*3/uL — ABNORMAL LOW (ref 150–400)
RBC: 3.46 MIL/uL — ABNORMAL LOW (ref 3.87–5.11)
RDW: 14.8 % (ref 11.5–15.5)
WBC: 5 10*3/uL (ref 4.0–10.5)
nRBC: 0.4 % — ABNORMAL HIGH (ref 0.0–0.2)

## 2019-06-07 MED ORDER — INSULIN DETEMIR 100 UNIT/ML ~~LOC~~ SOLN
10.0000 [IU] | Freq: Every day | SUBCUTANEOUS | Status: DC
  Administered 2019-06-07: 10 [IU] via SUBCUTANEOUS
  Filled 2019-06-07 (×3): qty 0.1

## 2019-06-07 MED ORDER — INSULIN ASPART 100 UNIT/ML ~~LOC~~ SOLN
6.0000 [IU] | Freq: Three times a day (TID) | SUBCUTANEOUS | Status: DC
  Administered 2019-06-07 (×2): 6 [IU] via SUBCUTANEOUS

## 2019-06-07 MED ORDER — INSULIN ASPART 100 UNIT/ML ~~LOC~~ SOLN
0.0000 [IU] | Freq: Three times a day (TID) | SUBCUTANEOUS | Status: DC
  Administered 2019-06-07: 1 [IU] via SUBCUTANEOUS

## 2019-06-07 NOTE — Progress Notes (Signed)
During change of  Shift 5/17 it was reported that pt's freestyle cbg machine was always giving low numbers so RN was using hospital cbg Machine after using the freestyle and going by that  [facility]) to give sliding scale insulin. During this shift it looks like freestyle keep giving low numbers and the hosptial machine been giving the actual number. Last cbg at 0050 was 117 according to Hospital cbg and freestyle gave 75. Please the freestyle machine has to be looked at by the Diabetes coordinator or someone.

## 2019-06-07 NOTE — Progress Notes (Addendum)
  HYPOGLYCEMIC EVENT At 0400 cbg was 62 without any symptoms. Pt was given apple juice and banana . Pt was rechecked after 30 mins and was 119. Will continue to monitor.

## 2019-06-07 NOTE — Progress Notes (Signed)
Patient ID: Kelli Mcpherson, female   DOB: 1995-11-20, 24 y.o.   MRN: [id number] Kelli Mcpherson is not having any problems this morning.  Alert and happy and eating breakfast No HA, [place] or RUQ pain  VS   05/17 0700  05/18 0659 05/18 0700  05/18 1042  Most Recent    Temp (F)    97.8-98.3 98.7  98.7 (37.1)  05/18 0815  Pulse Rate    85-99 105  105   05/18 0815  Resp    18-20 99  99   05/18 0815  BP    121/63-157/91 140/94  140/94   05/18 0815  SpO2 (%)    96-99 99  99  05/18 0815      Labs: Stable with mildly elevated LFTs Plts mildly low but stable at 139K Gls levels improved and occas hypoglycemic episodes  FHR  140s with accels no decels Cat 1 BPP yesterday 6/8 (-2 FM).  Normal AFI, Vtx presentation  Abd: Gravid nt Neg homans bilaterally  Imp/Plan: A/P: 24 yo G1P0 @ 3 1/7 ega originally presented with DKA and COVID now recovering and with incidentally found PIH without severe features.  Preeclampsia - clinically stable without severe features and BPs  In mildly elevated range. Plts stable and mildly low.  LFTs mildly elevated and stable.  Unclear if this is covid related or PIH.  Cont to follow labs Deliver for Severe features, worsening labs, or fetal compromise.  Fetal Well Being - Reassuring EFM and BPP.  Oligo has resolved. Twice weekly BPP and NST q shift. BMZ series completed 06/03/19 MFM still following  AODM - (not gestational DM).  Continue with DM and IM management.  DKA has resolved.  Covid positive 05/17/19) Doing well clinically s/p Remdesivir

## 2019-06-07 NOTE — Progress Notes (Signed)
Spoke with Luanna Cole RN concerning PICC dsg change. At this time another RN on the floor will change the PICC dsg.

## 2019-06-07 NOTE — Progress Notes (Signed)
Sander Nephew                                                                   PROGRESS NOTE                                                                                                                                                                                                             Patient Demographics:    Kelli Mcpherson, is a 24 y.o. female, DOB - 1995-11-08, ZOX:[id number]  Outpatient Primary MD for the patient is Ranae Pila, MD   Admit date - 05/29/2019   LOS - 9  No chief complaint on file.      Brief Narrative: Patient is a 24 y.o. female with PMHx of gestational diabetes-on Metformin-who presented with DKA and COVID-19 pneumonia.  Hospitalist service consulted for management of these conditions.  Significant Events: 4/27>> Covid positive 5/2>> ED visit for SOB/nausea/vomiting and lower abdominal pain-given IV fluids and discharged home. 5/4>> ED visit with nausea/vomiting-discharged home 5/9>> admit for COVID-19 pneumonia, DKA, hospitalist service consulted.  COVID-19 medications: Steroids: 5/11>>5/12 (betamethasone for OB indication) Remdesivir: 5/9>>5/13  Antibiotics: None  Microbiology data: 5/9: Urine culture: <10,000 colonies/mL insignificant growth, no group B strep isolated  DVT prophylaxis: SQ Lovenox  Procedures: None   Subjective:   No major complaints.  One episode of hypoglycemia earlier this morning.   Assessment  & Plan :   Acute Hypoxic Resp Failure due to Covid 19 Viral pneumonia: Hypoxia has resolved-her O2 saturations are persistently above 95%.  She has completed a course of remdesivir-since she is not hypoxic-she does not require steroids for COVID-19.   Fever: afebrile O2 requirements:  SpO2: 99 % O2 Flow Rate (L/min): 2 L/min  Prone/Incentive Spirometry: encouraged incentive spirometry use 3-4/hour.  COVID-19 Labs: No results for input(s): DDIMER, FERRITIN, LDH, CRP in the last 72 hours.     Component Value Date/Time     BNP 26.1 05/29/2019 1247    No results for input(s): PROCALCITON in the last 168 hours.  Lab Results  Component Value Date   SARSCOV2NAA POSITIVE (A) 05/17/2019    DKA/starvation ketoacidosis (A1c 7.3): Resolved-she was kept on IV insulin-as she received betamethasone-but given that her diet remains erratic/noncompliant at times-and the fact that she probably has preeclampsia-and needs tight  glycemic control-she was continued on IV insulin for a few more days.  Upon patient request on 5/16-she was transitioned to subcutaneous insulin.  1 hypoglycemic episode last night-decrease Levemir to 10 units-increase NovoLog to 6 units with meals-continue SSI.  We will continue to follow and optimize.  Hypokalemia: Repleted.  Hypomagnesemia: Repleted  Subclinical hyperthyroidism: Prior physician discussed with endocrinology-recommendations are for outpatient follow-up.  Thyrotropin receptor antibody negative.  SVT:-appreciate cardiology input.  Echo with preserved EF  Mild transaminitis: Likely secondary to COVID-19-continue to monitor-as could be related to underlying pregnancy related complications.  Has been relatively stable for the past several days-continue to follow periodically.  32 weeks intrauterine pregnancy with probable preeclampsia: Deferred to the primary service  Obesity: Estimated body mass index is 36.84 kg/m as calculated from the following:   Height as of this encounter: 6' (1.829 m).   Weight as of this encounter: 123.2 kg.   ABG:    Component Value Date/Time   HCO3 13.7 (L) 05/29/2019 1746   ACIDBASEDEF 11.2 (H) 05/29/2019 1746   O2SAT 88.5 05/29/2019 1746   Vent Settings: N/A  Condition -  Guarded  Family Communication  : Patient prefers to update family herself-I have asked to let me know if family has any Covid related questions.  Code Status :  Full Code  Diet :  Diet Order            Diet gestational carb mod Room service appropriate? Yes; Fluid  consistency: Thin  Diet effective now               Disposition Plan  :   Per primary service  Antimicorbials  :    Anti-infectives (From admission, onward)   Start     Dose/Rate Route Frequency Ordered Stop   05/30/19 1000  remdesivir 100 mg in sodium chloride 0.9 % 100 mL IVPB  Status:  Discontinued     100 mg 200 mL/hr over 30 Minutes Intravenous Daily 05/29/19 1345 05/29/19 1347   05/30/19 1000  remdesivir 100 mg in sodium chloride 0.9 % 100 mL IVPB     100 mg 200 mL/hr over 30 Minutes Intravenous Daily 05/29/19 1348 06/02/19 1037   05/29/19 1400  remdesivir 200 mg in sodium chloride 0.9% 250 mL IVPB     200 mg 580 mL/hr over 30 Minutes Intravenous Once 05/29/19 1348 05/29/19 1602   05/29/19 1345  remdesivir 200 mg in sodium chloride 0.9% 250 mL IVPB  Status:  Discontinued     200 mg 580 mL/hr over 30 Minutes Intravenous Once 05/29/19 1345 05/29/19 1347      Inpatient Medications  Scheduled Meds: . albuterol  2 puff Inhalation Q6H  . Chlorhexidine Gluconate Cloth  6 each Topical Daily  . docusate sodium  100 mg Oral Daily  . enoxaparin (LOVENOX) injection  60 mg Subcutaneous Q24H  . insulin aspart  0-16 Units Subcutaneous Q4H  . insulin aspart  6 Units Subcutaneous TID WC  . insulin detemir  10 Units Subcutaneous Daily  . pantoprazole  40 mg Oral Daily  . prenatal multivitamin  1 tablet Oral Q1200  . sodium chloride flush  10-40 mL Intracatheter Q12H  . sodium chloride flush  3 mL Intravenous Q12H   Continuous Infusions: . albumin human     PRN Meds:.[DISCONTINUED] acetaminophen **OR** acetaminophen, acetaminophen, bisacodyl, calcium carbonate, dextrose, menthol-cetylpyridinium, ondansetron **OR** ondansetron (ZOFRAN) IV, phenol, polyethylene glycol, promethazine, sodium chloride flush, triamcinolone cream, zolpidem   Time Spent in minutes  25  See all Orders from today for further details   Jeoffrey Massed M.D on 06/07/2019 at 10:25 AM  To page go to  [email or web address] - use universal password  [facility] -  Office  [phone]    Objective:   Vitals:   06/06/19 1251 06/06/19 2000 06/06/19 2341 06/07/19 0815  BP: 125/68 129/68 121/63 (!) 140/94  Pulse: 95 98 85 (!) 105  Resp: 18 20 20  (!) 99  Temp: 98.3 F (36.8 C) 97.9 F (36.6 C) 98.3 F (36.8 C) 98.7 F (37.1 C)  TempSrc: Oral Oral Oral Oral  SpO2: 96%  99% 99%  Weight:      Height:        Wt Readings from Last 3 Encounters:  05/29/19 123.2 kg  05/21/19 (!) 136.5 kg  05/18/19 (!) 136.5 kg     Intake/Output Summary (Last 24 hours) at 06/07/2019 1025 Last data filed at 06/07/2019 0944 Gross per 24 hour  Intake 2160 ml  Output 2480 ml  Net -320 ml     Physical Exam Gen Exam:Alert awake-not in any distress HEENT:atraumatic, normocephalic Chest: B/L clear to auscultation anteriorly CVS:S1S2 regular Abdomen:soft non tender, non distended Extremities:no edema Neurology: Non focal Skin: no rash   Data Review:    CBC Recent Labs  Lab 06/01/19 0314 06/01/19 0314 06/02/19 0020 06/03/19 0443 06/04/19 0915 06/06/19 1149 06/07/19 0610  WBC 5.6   < > 6.7 6.1 5.4 5.5 5.0  HGB 11.7*   < > 11.8* 9.7* 9.8* 10.2* 9.7*  HCT 35.5*   < > 36.1 30.7* 30.8* 32.5* 30.7*  PLT 187   < > 204 173 156 127* 139*  MCV 84.5   < > 85.3 86.5 87.7 89.0 88.7  MCH 27.9   < > 27.9 27.3 27.9 27.9 28.0  MCHC 33.0   < > 32.7 31.6 31.8 31.4 31.6  RDW 13.4   < > 13.5 13.3 13.9 14.6 14.8  LYMPHSABS 1.4  --  1.7 1.7  --   --   --   MONOABS 0.4  --  0.4 0.7  --   --   --   EOSABS 0.1  --  0.1 0.0  --   --   --   BASOSABS 0.0  --  0.0 0.0  --   --   --    < > = values in this interval not displayed.    Chemistries  Recent Labs  Lab 06/03/19 0443 06/03/19 1458 06/03/19 1625 06/03/19 2133 06/04/19 0915 06/05/19 0459 06/06/19 1149  NA 139   < > 137 135 142 139 137  K 2.7*   < > 3.2* 3.3* 4.0 3.6 3.6  CL 111   < > 111 109 111 112* 106  CO2 20*   < > 19* 17* 23 21* 23    GLUCOSE 89   < > 91 105* 135* 98 98  BUN 5*   < > <5* <5* <5* <5* <5*  CREATININE 0.84   < > 0.85 0.82 0.84 0.64 0.75  CALCIUM 8.6*   < > 8.6* 8.7* 9.2 8.7* 8.8*  MG  --   --  1.6*  --  1.4* 1.7 1.5*  AST 55*  --  57*  --  56* 53* 58*  ALT 50*  --  55*  --  57* 54* 58*  ALKPHOS 88  --  91  --  99 96 98  BILITOT 0.8  --  0.5  --  0.8 0.5 0.7   < > =  values in this interval not displayed.   ------------------------------------------------------------------------------------------------------------------ No results for input(s): CHOL, HDL, LDLCALC, TRIG, CHOLHDL, LDLDIRECT in the last 72 hours.  Lab Results  Component Value Date   HGBA1C 7.3 (H) 05/29/2019   ------------------------------------------------------------------------------------------------------------------ No results for input(s): TSH, T4TOTAL, T3FREE, THYROIDAB in the last 72 hours.  Invalid input(s): FREET3 ------------------------------------------------------------------------------------------------------------------ No results for input(s): VITAMINB12, FOLATE, FERRITIN, TIBC, IRON, RETICCTPCT in the last 72 hours.  Coagulation profile No results for input(s): INR, PROTIME in the last 168 hours.  No results for input(s): DDIMER in the last 72 hours.  Cardiac Enzymes No results for input(s): CKMB, TROPONINI, MYOGLOBIN in the last 168 hours.  Invalid input(s): CK ------------------------------------------------------------------------------------------------------------------    Component Value Date/Time   BNP 26.1 05/29/2019 1247    Micro Results Recent Results (from the past 240 hour(s))  OB Urine Culture     Status: Abnormal   Collection Time: 05/29/19 12:08 PM   Specimen: OB Clean Catch; Urine  Result Value Ref Range Status   Specimen Description OB CLEAN CATCH  Final   Special Requests NONE  Final   Culture (A)  Final    <10,000 COLONIES/mL INSIGNIFICANT GROWTH NO GROUP B STREP (S.AGALACTIAE)  ISOLATED Performed at [facility], [address] [place], [place] [address]    Report Status 05/30/2019 FINAL  Final    Radiology Reports DG Chest Port 1 View  Result Date: 06/01/2019 CLINICAL DATA:  COVID-19 positive 05/18/2019, PICC placement EXAM: PORTABLE CHEST 1 VIEW COMPARISON:  05/31/2019 at 3:53 p.m. FINDINGS: Single frontal view of the chest again demonstrates left-sided PICC extending over a left-sided mediastinum, in a configuration suggesting persistent left-sided SVC. No significant change since prior study. Cardiac silhouette is stable. Multifocal bilateral airspace disease greatest in the left mid and right lower lung zones unchanged. No effusion or pneumothorax. IMPRESSION: 1. Stable position of the left-sided PICC line, suggesting persistent left-sided superior vena cava. Please correlate with catheter function. 2. Stable multifocal pneumonia. Electronically Signed   By: Sharlet Salina M.D.   On: 06/01/2019 01:13   DG CHEST PORT 1 VIEW  Addendum Date: 05/31/2019   ADDENDUM REPORT: 05/31/2019 16:25 ADDENDUM: It should be noted left SVC typically drains into the coronary sinus and subsequently the right atrium as suggested on recent echocardiogram. Current catheter position does not reach the right atrium and is satisfactory for usage. Electronically Signed   By: Alcide Clever M.D.   On: 05/31/2019 16:25   Result Date: 05/31/2019 CLINICAL DATA:  Status post PICC line placement EXAM: PORTABLE CHEST 1 VIEW COMPARISON:  05/29/2019 FINDINGS: Cardiac shadow is again mildly prominent. New left-sided PICC line is seen with the catheter tip presenting over the mid cardiac shadow. Recent echocardiography suggested a left superior vena cava and the catheter position with support this diagnosis. The lungs are well aerated bilaterally with patchy opacities consistent with the given clinical history of COVID-19 positivity. No bony abnormality is seen. IMPRESSION: PICC line placement with the  catheter tip projecting over the cardiac shadow to the left of the midline likely within a persistent left superior vena cava. Scattered opacities bilaterally consistent with the given clinical history. Electronically Signed: By: Alcide Clever M.D. On: 05/31/2019 16:12   DG CHEST PORT 1 VIEW  Result Date: 05/29/2019 CLINICAL DATA:  Shortness of breath and cough. COVID-19 positive. EXAM: PORTABLE CHEST 1 VIEW COMPARISON:  May 22, 2019 FINDINGS: Cardiomediastinal silhouette is mildly enlarged. Interval development of patchy bilateral airspace opacities with lower lobe predominance. Osseous  structures are without acute abnormality. Soft tissues are grossly normal. IMPRESSION: 1. Interval development of patchy bilateral airspace opacities with lower lobe predominance consistent with atypical pneumonia. 2. Enlarged cardiac silhouette. Electronically Signed   By: Ted Mcalpine M.D.   On: 05/29/2019 12:13   DG Chest Port 1 View  Result Date: 05/22/2019 CLINICAL DATA:  Shortness of breath.  COVID-19. EXAM: PORTABLE CHEST 1 VIEW COMPARISON:  08/07/2018 FINDINGS: The heart size and mediastinal contours are within normal limits. Both lungs are clear. The visualized skeletal structures are unremarkable. IMPRESSION: Clear lungs. Electronically Signed   By: Deatra Robinson M.D.   On: 05/22/2019 02:41   [place] MFM FETAL BPP WO NON STRESS  Result Date: 06/06/2019 ----------------------------------------------------------------------  OBSTETRICS REPORT                       (Signed Final 06/06/2019 04:20 pm) ---------------------------------------------------------------------- Patient Info  ID #:       [id number]                          D.O.B.:  [birth year] (23 yrs)  Name:       Kelli Mcpherson              Visit Date: 06/06/2019 12:15 pm ---------------------------------------------------------------------- Performed By  Attending:        Ma Rings MD         Ref. Address:     [facility]                                                             [address]                                                             [place]                                                             [id number]  Performed By:     Marcellina Millin          Secondary Phy.:   [facility]  RDMS                                                             Care  Referred By:      Candice Camp MD          [facility] ---------------------------------------------------------------------- Orders  #  Description                           Code        Ordered By  1  [place] MFM FETAL BPP WO NON               76819.01    ELISE LEGER     STRESS ----------------------------------------------------------------------  #  Order #                     Accession #                Episode #  1  [id number]                   [id number]                 [id number] ---------------------------------------------------------------------- Indications  Gestational diabetes in pregnancy,             O24.415  controlled by oral hypoglycemic drugs, DKA  [redacted] weeks gestation of pregnancy                Z3A.33  Oligohydraminios, third trimester, unspecified O41.03X0  Obesity complicating pregnancy, third          O99.213  trimester  Unspecified maternal hypertension, third       O16.3  trimester  COVID19 (+)  (4/27) ---------------------------------------------------------------------- Fetal Evaluation  Num Of Fetuses:         1  Fetal Heart Rate(bpm):  157  Cardiac Activity:       Observed  Presentation:           Cephalic  Placenta:               Anterior  P. Cord Insertion:      Visualized  Amniotic Fluid  AFI FV:      Within normal limits  AFI Sum(cm)     %Tile       Largest Pocket(cm)  14.5            51          4.4  RUQ(cm)       RLQ(cm)        LUQ(cm)        LLQ(cm)  4.4           3.2           2.7            4.2 ---------------------------------------------------------------------- Biophysical Evaluation  Amniotic F.V:  Within normal limits       F. Tone:        Observed  F. Movement:    Not Observed               Score:          6/8  F. Breathing:   Observed ---------------------------------------------------------------------- OB History  Gravidity:    1 ---------------------------------------------------------------------- Gestational Age  LMP:           33w 0d        Date:  10/18/18                 EDD:   07/25/19  Best:          33w 0d     Det. By:  LMP  (10/18/18)          EDD:   07/25/19 ---------------------------------------------------------------------- Anatomy  Stomach:               Appears normal, left   Bladder:                Appears normal                         sided ---------------------------------------------------------------------- Comments  This patient has been hospitalized to preeclampsia with  elevated liver function tests.  Her diabetic ketoacidosis has  resolved.  A biophysical profile performed today was 6 out of 8.  She  received a -2 for fetal movements that did not meet criteria.  There was normal amniotic fluid noted on today's ultrasound  exam.  The patient should continue to have daily nonstress tests  performed while she is hospitalized. ----------------------------------------------------------------------                   Ma Rings, MD Electronically Signed Final Report   06/06/2019 04:20 pm ----------------------------------------------------------------------  [place] MFM FETAL BPP WO NON STRESS  Result Date: 06/03/2019 ----------------------------------------------------------------------  OBSTETRICS REPORT                       (Signed Final 06/03/2019 02:33 pm) ---------------------------------------------------------------------- Patient Info  ID #:       [id number]                          D.O.B.:   26-Sep-1995 (23 yrs)  Name:       Kelli Mcpherson              Visit Date: 06/03/2019 07:09 am ---------------------------------------------------------------------- Performed By  Attending:        Ma Rings MD         Ref. Address:     [facility]                                                             [address]                                                             [place]                                                             [phone]  Performed By:     Tommie Raymond BS,       Secondary Phy.:   [facility]                    RDMS, RVT                                                             Care  Referred By:      Candice Camp MD          [facility] ---------------------------------------------------------------------- Orders  #  Description                           Code        Ordered By  1  [place] MFM FETAL BPP WO NON               60454.09    DAVID LOWE     STRESS ----------------------------------------------------------------------  #  Order #                     Accession #                Episode #  1  [id number]                   [id number]                 [id number] ---------------------------------------------------------------------- Indications  Gestational diabetes in pregnancy,             O24.415  controlled by oral hypoglycemic drugs, DKA  Oligohydraminios, third trimester, unspecified O41.03X0  [redacted] weeks gestation of pregnancy                Z3A.32  Obesity complicating pregnancy, third          O99.213  trimester  Unspecified maternal hypertension, third       O16.3  trimester  COVID19 (+)  (4/27)  Encounter for other antenatal screening        Z36.2  follow-up ---------------------------------------------------------------------- Fetal  Evaluation  Num Of Fetuses:  1  Fetal Heart Rate(bpm):  152  Cardiac Activity:       Observed  Presentation:           Cephalic  Placenta:               Anterior  P. Cord Insertion:      Not well visualized  Amniotic Fluid  AFI FV:      Subjectively low-normal  AFI Sum(cm)     %Tile       Largest Pocket(cm)  9.6             14          3.2  RUQ(cm)       RLQ(cm)       LUQ(cm)        LLQ(cm)  3.2           2.7           1.9            1.8 ---------------------------------------------------------------------- Biophysical Evaluation  Amniotic F.V:   Pocket => 2 cm             F. Tone:        Observed  F. Movement:    Observed                   Score:          8/8  F. Breathing:   Observed ---------------------------------------------------------------------- OB History  Gravidity:    1 ---------------------------------------------------------------------- Gestational Age  LMP:           32w 4d        Date:  10/18/18                 EDD:   07/25/19  Best:          Armida Sans32w 4d     Det. By:  LMP  (10/18/18)          EDD:   07/25/19 ---------------------------------------------------------------------- Cervix Uterus Adnexa  Cervix  Not visualized (advanced GA >24wks)  Uterus  No abnormality visualized.  Right Ovary  Within normal limits. No adnexal mass visualized.  Left Ovary  Within normal limits. No adnexal mass visualized.  Cul De Sac  No free fluid seen.  Adnexa  No abnormality visualized. ---------------------------------------------------------------------- Comments  This patient has been hospitalized due to diabetic  ketoacidosis which is now resolved.  She has also been  diagnosed with preeclampsia based on her elevated blood  pressures and 24-hour urine results showing 1.2 g of protein.  Her blood pressures remain in the 130s to 140s over 80s to  90s range.  Oligohydramnios had been noted on her prenatal  ultrasounds earlier this week.  The patient has already  received a complete course of antenatal  corticosteroids.  A biophysical profile performed today was 8 out of 8.  The  amniotic fluid level has increased to 9.2 cm now that she is  better hydrated.  Due to preeclampsia with mildly elevated liver function tests  in the 50s range, I would recommend that she continue  inpatient management until delivery.  The patient's liver function tests may not be increasing due to  the effects of the antenatal corticosteroids she recently  received.  I anticipate that her lab work may become more  abnormal once the steroid effects go away.  Usually,  expectant management of severe preeclampsia is successful  in delaying delivery between 5 to 7 days.  She should  continue to be followed with daily PIH labs.  I would recommend delivery should her liver function tests  continue to increase, should her platelet levels decrease, or  should her blood pressures be persistently greater than 150/  100s. ----------------------------------------------------------------------                   Ma Rings, MD Electronically Signed Final Report   06/03/2019 02:33 pm ----------------------------------------------------------------------  [place] MFM FETAL BPP WO NON STRESS  Result Date: 05/31/2019 ----------------------------------------------------------------------  OBSTETRICS REPORT                       (Signed Final 05/31/2019 06:01 pm) ---------------------------------------------------------------------- Patient Info  ID #:       [id number]                          D.O.B.:  [birth year] (23 yrs)  Name:       Kelli Mcpherson              Visit Date: 05/31/2019 09:27 am ---------------------------------------------------------------------- Performed By  Attending:        Ma Rings MD         Ref. Address:     [facility]                                                             [address]                                                              [place]                                                             [phone]  Performed By:     Tommie Raymond BS,       Secondary Phy.:   [facility]                    RDMS, RVT  Care  Referred By:      Candice Camp MD          [facility] ---------------------------------------------------------------------- Orders  #  Description                           Code        Ordered By  1  [place] MFM OB DETAIL +14 WK               76811.01    MEGAN MORRIS  2  [place] MFM UA CORD DOPPLER                76820.02    MEGAN MORRIS  3  [place] MFM FETAL BPP WO NON               76819.01    MEGAN MORRIS     STRESS ----------------------------------------------------------------------  #  Order #                     Accession #                Episode #  1  [id number]                   [id number]                 [id number]  2  [id number]                   [id number]                 [id number]  3  [id number]                   [id number]                 [id number] ---------------------------------------------------------------------- Indications  Gestational diabetes in pregnancy,             O24.415  controlled by oral hypoglycemic drugs, DKA  Oligohydraminios, third trimester, unspecified O41.03X0  [redacted] weeks gestation of pregnancy                Z3A.32  Obesity complicating pregnancy, third          O99.213  trimester  Unspecified maternal hypertension, third       O16.3  trimester  COVID19 (+)  (4/27)  Encounter for antenatal screening for          Z36.3  malformations  Encounter for other antenatal screening        Z36.2  follow-up ---------------------------------------------------------------------- Fetal Evaluation  Num Of Fetuses:         1  Fetal Heart Rate(bpm):  142  Cardiac Activity:       Observed  Presentation:            Cephalic  Placenta:               Anterior  P. Cord Insertion:      Not well visualized  Amniotic Fluid  AFI FV:  Oligohydramnios  AFI Sum(cm)     %Tile       Largest Pocket(cm)  5.3             < 3         2.6  RUQ(cm)       RLQ(cm)       LUQ(cm)        LLQ(cm)  1.8           0             0.9            2.6 ---------------------------------------------------------------------- Biophysical Evaluation  Amniotic F.V:   Pocket => 2 cm             F. Tone:        Not Observed  F. Movement:    Observed                   Score:          6/8  F. Breathing:   Observed ---------------------------------------------------------------------- Biometry  BPD:      82.8  mm     G. Age:  33w 2d         76  %    CI:        83.92   %    70 - 86                                                          FL/HC:      21.8   %    19.1 - 21.3  HC:      284.9  mm     G. Age:  31w 2d        4.3  %    HC/AC:      0.98        0.96 - 1.17  AC:       292   mm     G. Age:  33w 1d         79  %    FL/BPD:     75.1   %    71 - 87  FL:       62.2  mm     G. Age:  32w 2d         39  %    FL/AC:      21.3   %    20 - 24  HUM:        57  mm     G. Age:  33w 1d         71  %  CER:      41.3  mm     G. Age:  35w 2d         87  %  LV:        4.5  mm  Est. FW:    2036  gm      4 lb 8 oz     58  % ---------------------------------------------------------------------- OB History  Gravidity:    1 ---------------------------------------------------------------------- Gestational Age  LMP:           32w 1d        Date:  10/18/18  EDD:   07/25/19  U/S Today:     32w 4d                                        EDD:   07/22/19  Best:          32w 1d     Det. By:  LMP  (10/18/18)          EDD:   07/25/19 ---------------------------------------------------------------------- Anatomy  Cranium:               Not well visualized    LVOT:                   Appears normal  Cavum:                 Not well visualized    Aortic Arch:            Not well  visualized  Ventricles:            Appears normal         Ductal Arch:            Appears normal  Choroid Plexus:        Appears normal         Diaphragm:              Appears normal  Cerebellum:            Not well visualized    Stomach:                Appears normal, left                                                                        sided  Posterior Fossa:       Not well visualized    Abdomen:                Appears normal  Nuchal Fold:           Not applicable (>11    Abdominal Wall:         Not well visualized                         wks GA)  Face:                  Orbits nl; profile not Cord Vessels:           Appears normal (3                         well visualized                                vessel cord)  Lips:                  Not well visualized    Kidneys:                Appear normal  Palate:  Not well visualized    Bladder:                Appears normal  Thoracic:              Appears normal         Spine:                  Ltd views no                                                                        intracranial signs of                                                                        NTD  Heart:                 Appears normal         Upper Extremities:      Visualized                         (4CH, axis, and                         situs)  RVOT:                  Appears normal         Lower Extremities:      Visualized  Other:  Technically difficult due to maternal habitus and fetal position.          Technically difficult due to low amniotic fluid. ---------------------------------------------------------------------- Doppler - Fetal Vessels  Umbilical Artery   S/D     %tile      RI               PI                     ADFV    RDFV   2.96       67    0.66             0.88                        No      No ---------------------------------------------------------------------- Cervix Uterus Adnexa  Cervix  Not visualized (advanced GA >24wks)  Uterus  No abnormality  visualized. ---------------------------------------------------------------------- Comments  This patient has been hospitalized due to diabetic  ketoacidosis and possible preeclampsia.  Her blood  pressures remain in the 120s to 150s over 80s to 90s range.  Her blood glucose levels are now in the normal range and her  diabetic ketoacidosis is resolving.  On today's exam, the overall fetal growth was appropriate for  her gestational age.  Oligohydramnios continues to be noted  today.  Doppler studies of the umbilical arteries performed today  shows a normal S/D ratio.  There were no signs  of absent or  reversed end-diastolic flow.  A biophysical profile performed today was 6 out of 8.  She  received a -2 for absent fetal tone.  Her 24-hour urine results are currently pending.  Based on  her elevated blood pressures and her elevated P/C ratio, she  probably is developing preeclampsia.  Due to probable preeclampsia at her current gestational age,  now that her diabetic ketoacidosis is resolving, I would  recommend that she receive a complete course of antenatal  corticosteroids.  She should be kept on IV insulin while she is  receiving her steroid course.  She should have another ultrasound later this week to  reassess the amniotic fluid levels. ----------------------------------------------------------------------                   Ma Rings, MD Electronically Signed Final Report   05/31/2019 06:01 pm ----------------------------------------------------------------------  [place] MFM FETAL BPP WO NON STRESS  Result Date: 05/30/2019 ----------------------------------------------------------------------  OBSTETRICS REPORT                       (Signed Final 05/30/2019 05:11 pm) ---------------------------------------------------------------------- Patient Info  ID #:       [id number]                          D.O.B.:  02-11-1995 (23 yrs)  Name:       Kelli Mcpherson              Visit Date: 05/30/2019 09:39 am  ---------------------------------------------------------------------- Performed By  Attending:        Ma Rings MD         Ref. Address:     [facility]                                                             [address]                                                             [place]  [address]  Performed By:     Tommie Raymond BS,       Secondary Phy.:   [facility]                    [facility]                                                             Care  Referred By:      Candice Camp MD          [facility] ---------------------------------------------------------------------- Orders  #  Description                           Code        Ordered By  1  [place] MFM FETAL BPP WO NON               28413.24    MEGAN MORRIS     STRESS ----------------------------------------------------------------------  #  Order #                     Accession #                Episode #  1  [id number]                   [id number]                 [id number] ---------------------------------------------------------------------- Indications  Oligohydraminios, third trimester, unspecified O41.03X0  Gestational diabetes in pregnancy,             O24.415  controlled by oral hypoglycemic drugs, DKA  Obesity complicating pregnancy, third          O99.213  trimester  Unspecified maternal hypertension, third       O16.3  trimester  COVID19 (+)  (4/27)  [redacted] weeks gestation of pregnancy                Z3A.31 ---------------------------------------------------------------------- Fetal Evaluation  Num Of Fetuses:         1  Fetal Heart Rate(bpm):  166  Cardiac Activity:       Observed  Presentation:           Cephalic  Placenta:                Anterior  P. Cord Insertion:      Not well visualized  Amniotic Fluid  AFI FV:      Oligohydramnios  AFI Sum(cm)     %Tile       Largest Pocket(cm)  2.2             < 3         1.1  RUQ(cm)       RLQ(cm)       LUQ(cm)  LLQ(cm)  1.1           0             1.1            0 ---------------------------------------------------------------------- Biophysical Evaluation  Amniotic F.V:   Oligohydramnios            F. Tone:        Observed  F. Movement:    Not Observed               Score:          4/8  F. Breathing:   Observed ---------------------------------------------------------------------- OB History  Gravidity:    1 ---------------------------------------------------------------------- Gestational Age  LMP:           32w 0d        Date:  10/18/18                 EDD:   07/25/19  Best:          Armida Sans 0d     Det. By:  LMP  (10/18/18)          EDD:   07/25/19 ---------------------------------------------------------------------- Anatomy  Diaphragm:             Appears normal         Kidneys:                Appear normal  Stomach:               Appears normal, left   Bladder:                Appears normal                         sided  Other:  Technically difficult due to low amniotic fluid. Technically difficult due          to maternal habitus and fetal position. ---------------------------------------------------------------------- Cervix Uterus Adnexa  Cervix  Not visualized (advanced GA >24wks)  Uterus  No abnormality visualized.  Right Ovary  Within normal limits. No adnexal mass visualized.  Left Ovary  Within normal limits. No adnexal mass visualized.  Cul De Sac  No free fluid seen.  Adnexa  No abnormality visualized. ---------------------------------------------------------------------- Comments  This patient has been hospitalized due to diabetic  ketoacidosis and persistent nausea and vomiting.  She also  tested positive for COVID-19 about 2 weeks ago.  She is  currently being treated with remdesivir for  Covid pneumonia.  She has been receiving IV hydration and IV insulin to help  resolve the diabetic ketoacidosis.  This morning, her fetal heart rate tracing has improved.  There are periods of variability and accelerations.  No further  decelerations are noted.  The patient is also more alert and  awake.  Her labs show hypernatremia with a sodium level of 150,  serum creatinine of 1.03, and AST and ALT that are mildly  elevated (51).  Her fingerstick values are now in the 110s to  120s range.  Her blood pressures remain in the 140s over  80s range.  A biophysical profile performed today was 4 out of 8.  The  patient received -2 for absent fetal movements and also -2 for  anhydramnios.  I reviewed the patient's ultrasound images from yesterday.  A  >2 cm pocket of amniotic fluid was noted yesterday.  Due to her symptoms of persistent nausea and vomiting, her  elevated blood pressures,  her elevated liver function tests,  and her elevated serum creatinine level, a diagnosis of  preeclampsia has not been ruled out.  A P/C ratio performed this afternoon was 0.86 (elevated).  A  24-hour urine collection is underway to assess the total  protein.  A course of antenatal corticosteroids for fetal lung maturity  has been held due to concerns that it may elevate her blood  glucose levels and further worsen her diabetic ketoacidosis.  The patient should be kept on continuous monitoring  overnight.  I would recommend delivery should the fetal  status be nonreassuring.  Should her diabetic ketoacidosis continue to resolve and her  blood pressures remain elevated therefore increasing the  suspicion for preeclampsia, a course of antenatal  corticosteroids should be administered tomorrow morning.  The patient will continue to receive IV hydration overnight.  We will repeat another ultrasound tomorrow to assess the  amniotic fluid levels, the fetal growth, and umbilical artery  Doppler studies.  ----------------------------------------------------------------------                   Ma Rings, MD Electronically Signed Final Report   05/30/2019 05:11 pm ----------------------------------------------------------------------  [place] MFM FETAL BPP WO NON STRESS  Result Date: 05/29/2019 ----------------------------------------------------------------------  OBSTETRICS REPORT                       (Signed Final 05/29/2019 12:15 pm) ---------------------------------------------------------------------- Patient Info  ID #:       [id number]                          D.O.B.:  [birth year] (23 yrs)  Name:       Kelli Mcpherson              Visit Date: 05/29/2019 09:58 am ---------------------------------------------------------------------- Performed By  Attending:        Lin Landsman      Ref. Address:      [facility]                                                              [address]  [place]                                                              [address]  Performed By:     Birdena Crandall        [facility]  Referred By:      Candice Camp MD ---------------------------------------------------------------------- Orders  #  Description                           Code        Ordered By  1  [place] MFM FETAL BPP WO NON               60454.09    DAVID LOWE     STRESS ----------------------------------------------------------------------  #  Order #                     Accession #                Episode #  1  [id number]                   [id number]                 [id number] ---------------------------------------------------------------------- Indications  Gestational diabetes in pregnancy,               O24.415  controlled by oral hypoglycemic drugs, DKA  Obesity complicating pregnancy, third           O99.213  trimester  Unspecified maternal hypertension, third        O16.3  trimester  COVID19 (+)  (4/27)  Oligohydraminios, third trimester, unspecified  O41.03X0  [redacted] weeks gestation of pregnancy                 Z3A.31 ---------------------------------------------------------------------- Fetal Evaluation  Num Of Fetuses:          1  Fetal Heart Rate(bpm):   150  Cardiac Activity:        Observed  Presentation:            Cephalic  Placenta:                Anterior  Amniotic Fluid  AFI FV:      Within normal limits  AFI Sum(cm)     %Tile       Largest Pocket(cm)  9.5             12          2.8  RUQ(cm)       RLQ(cm)       LUQ(cm)        LLQ(cm)  2.8           2.5           2.8            1.4 ---------------------------------------------------------------------- Biophysical Evaluation  Amniotic F.V:   Within normal limits       F. Tone:         Observed  F. Movement:    Not Observed               Score:           4/8  F. Breathing:   Not Observed ---------------------------------------------------------------------- OB History  Gravidity:    1 ---------------------------------------------------------------------- Gestational Age  LMP:           31w 6d        Date:  10/18/18                 EDD:   07/25/19  Best:          31w 6d     Det. By:  LMP  (10/18/18)          EDD:   07/25/19 ---------------------------------------------------------------------- Anatomy  Stomach:               Appears normal, left   Kidneys:                Appear normal                         sided  Abdominal Wall:        Appears nml (cord      Bladder:                Appears normal                         insert, abd wall) ---------------------------------------------------------------------- Impression  Limited exam due to presumed maternal DKA  Biophysical profile 4 out of 8 there is some movement but not  enough to pass the BPP.   Labs are pending with continued fetal monitoring.  I discussed with Dr. Rana Snare the current plan and agree. ---------------------------------------------------------------------- Recommendations  Continueed Fetal monitoring  Hold Betamethsone  DKA protocol including insulin drip and fluid hydration with  the goal of blood sugars 100-140's.  Will await labs to perform consultation and diagnosis of DKA.  Continue COVID protocol. ----------------------------------------------------------------------               Lin Landsman, MD Electronically Signed Final Report   05/29/2019 12:15 pm ----------------------------------------------------------------------  [place] MFM OB DETAIL +14 WK  Result Date: 05/31/2019 ----------------------------------------------------------------------  OBSTETRICS REPORT                       (Signed Final 05/31/2019 06:01 pm) ---------------------------------------------------------------------- Patient Info  ID #:       [id number]                          D.O.B.:  [birth year] (23 yrs)  Name:       Kelli Mcpherson              Visit Date: 05/31/2019 09:27 am ---------------------------------------------------------------------- Performed By  Attending:        Ma Rings MD         Ref. Address:     [facility]                                                             [address]                                                             [place]                                                             [address]  Performed By:     Tommie Raymond BS,       Secondary Phy.:   [facility]                    [facility], [facility]                                                             Care  Referred By:      Candice Camp MD          [facility]  ---------------------------------------------------------------------- Orders  #  Description                           Code        Ordered By  1  [place] MFM OB DETAIL +14 WK               76811.01    MEGAN MORRIS  2  [place] MFM UA CORD DOPPLER                76820.02    MEGAN MORRIS  3  [place] MFM FETAL BPP WO NON               60454.09    MEGAN MORRIS     STRESS ----------------------------------------------------------------------  #  Order #  Accession #                Episode #  1  [id number]                   [id number]                 [id number]  2  [id number]                   [id number]                 [id number]  3  [id number]                   [id number]                 [id number] ---------------------------------------------------------------------- Indications  Gestational diabetes in pregnancy,             O24.415  controlled by oral hypoglycemic drugs, DKA  Oligohydraminios, third trimester, unspecified O41.03X0  [redacted] weeks gestation of pregnancy                Z3A.32  Obesity complicating pregnancy, third          O99.213  trimester  Unspecified maternal hypertension, third       O16.3  trimester  COVID19 (+)  (4/27)  Encounter for antenatal screening for          Z36.3  malformations  Encounter for other antenatal screening        Z36.2  follow-up ---------------------------------------------------------------------- Fetal Evaluation  Num Of Fetuses:         1  Fetal Heart Rate(bpm):  142  Cardiac Activity:       Observed  Presentation:           Cephalic  Placenta:               Anterior  P. Cord Insertion:      Not well visualized  Amniotic Fluid  AFI FV:      Oligohydramnios  AFI Sum(cm)     %Tile       Largest Pocket(cm)  5.3             < 3         2.6  RUQ(cm)       RLQ(cm)       LUQ(cm)        LLQ(cm)  1.8           0             0.9            2.6 ---------------------------------------------------------------------- Biophysical Evaluation  Amniotic F.V:   Pocket => 2 cm             F. Tone:         Not Observed  F. Movement:    Observed                   Score:          6/8  F. Breathing:   Observed ---------------------------------------------------------------------- Biometry  BPD:      82.8  mm     G. Age:  33w 2d         76  %    CI:        83.92   %    70 - 86  FL/HC:      21.8   %    19.1 - 21.3  HC:      284.9  mm     G. Age:  31w 2d        4.3  %    HC/AC:      0.98        0.96 - 1.17  AC:       292   mm     G. Age:  33w 1d         79  %    FL/BPD:     75.1   %    71 - 87  FL:       62.2  mm     G. Age:  32w 2d         39  %    FL/AC:      21.3   %    20 - 24  HUM:        57  mm     G. Age:  33w 1d         71  %  CER:      41.3  mm     G. Age:  35w 2d         87  %  LV:        4.5  mm  Est. FW:    2036  gm      4 lb 8 oz     58  % ---------------------------------------------------------------------- OB History  Gravidity:    1 ---------------------------------------------------------------------- Gestational Age  LMP:           32w 1d        Date:  10/18/18                 EDD:   07/25/19  U/S Today:     32w 4d                                        EDD:   07/22/19  Best:          32w 1d     Det. By:  LMP  (10/18/18)          EDD:   07/25/19 ---------------------------------------------------------------------- Anatomy  Cranium:               Not well visualized    LVOT:                   Appears normal  Cavum:                 Not well visualized    Aortic Arch:            Not well visualized  Ventricles:            Appears normal         Ductal Arch:            Appears normal  Choroid Plexus:        Appears normal         Diaphragm:              Appears normal  Cerebellum:            Not well visualized    Stomach:  Appears normal, left                                                                        sided  Posterior Fossa:       Not well visualized    Abdomen:                Appears normal  Nuchal Fold:           Not  applicable (>20    Abdominal Wall:         Not well visualized                         wks GA)  Face:                  Orbits nl; profile not Cord Vessels:           Appears normal (3                         well visualized                                vessel cord)  Lips:                  Not well visualized    Kidneys:                Appear normal  Palate:                Not well visualized    Bladder:                Appears normal  Thoracic:              Appears normal         Spine:                  Ltd views no                                                                        intracranial signs of                                                                        NTD  Heart:                 Appears normal         Upper Extremities:      Visualized                         (  4CH, axis, and                         situs)  RVOT:                  Appears normal         Lower Extremities:      Visualized  Other:  Technically difficult due to maternal habitus and fetal position.          Technically difficult due to low amniotic fluid. ---------------------------------------------------------------------- Doppler - Fetal Vessels  Umbilical Artery   S/D     %tile      RI               PI                     ADFV    RDFV   2.96       67    0.66             0.88                        No      No ---------------------------------------------------------------------- Cervix Uterus Adnexa  Cervix  Not visualized (advanced GA >24wks)  Uterus  No abnormality visualized. ---------------------------------------------------------------------- Comments  This patient has been hospitalized due to diabetic  ketoacidosis and possible preeclampsia.  Her blood  pressures remain in the 120s to 150s over 80s to 90s range.  Her blood glucose levels are now in the normal range and her  diabetic ketoacidosis is resolving.  On today's exam, the overall fetal growth was appropriate for  her gestational age.  Oligohydramnios continues  to be noted  today.  Doppler studies of the umbilical arteries performed today  shows a normal S/D ratio.  There were no signs of absent or  reversed end-diastolic flow.  A biophysical profile performed today was 6 out of 8.  She  received a -2 for absent fetal tone.  Her 24-hour urine results are currently pending.  Based on  her elevated blood pressures and her elevated P/C ratio, she  probably is developing preeclampsia.  Due to probable preeclampsia at her current gestational age,  now that her diabetic ketoacidosis is resolving, I would  recommend that she receive a complete course of antenatal  corticosteroids.  She should be kept on IV insulin while she is  receiving her steroid course.  She should have another ultrasound later this week to  reassess the amniotic fluid levels. ----------------------------------------------------------------------                   Ma Rings, MD Electronically Signed Final Report   05/31/2019 06:01 pm ----------------------------------------------------------------------  ECHOCARDIOGRAM COMPLETE  Result Date: 05/31/2019    ECHOCARDIOGRAM REPORT   Patient Name:   Kelli Mcpherson Date of Exam: 05/31/2019 Medical Rec #:  [id number]          Height:       72.0 in Accession #:    [id number]         Weight:       271.6 lb Date of Birth:  [birth year]          BSA:          2.427 m Patient Age:    23 years           BP:  133/68 mmHg Patient Gender: F                  HR:           109 bpm. Exam Location:  Inpatient Procedure: 2D Echo, Color Doppler and Cardiac Doppler Indications:    I47.1 SVT  History:        Patient has no prior history of Echocardiogram examinations.                 COVID+ 05/17/19                 [redacted] Weeks Pregnant at time of study.  Sonographer:    Irving Burton Senior RDCS Referring Phys: [id number] DAWOOD S Randol Kern  Sonographer Comments: Technically difficult due to patient body habitus. IMPRESSIONS  1. Left ventricular ejection fraction, by estimation, is 50  to 55%. The left ventricle has low normal function. The left ventricle demonstrates global hypokinesis. Indeterminate diastolic filling due to E-A fusion. There is abnormal (paradoxical) septal  motion, consistent with right ventricular volume overload.  2. Right ventricular systolic function is normal. The right ventricular size is normal. Tricuspid regurgitation signal is inadequate for assessing PA pressure.  3. 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 The inferior vena cava is also dilated, but the right heart chambers are not enlarged.  4. The mitral valve is normal in structure. No evidence of mitral valve regurgitation. No evidence of mitral stenosis.  5. The aortic valve is normal in structure. Aortic valve regurgitation is not visualized. No aortic stenosis is present.  6. The inferior vena cava is dilated in size with <50% respiratory variability, suggesting right atrial pressure of 15 mmHg. Comparison(s): No prior Echocardiogram. Conclusion(s)/Recommendation(s): Recommend a saline contrast injection from a vein in the left upper extremity, with imaging of the coronary sinus. FINDINGS  Left Ventricle: Left ventricular ejection fraction, by estimation, is 50 to 55%. The left ventricle has low normal function. The left ventricle demonstrates global hypokinesis. The left ventricular internal cavity size was normal in size. There is no left ventricular hypertrophy. Abnormal (paradoxical) septal motion, consistent with right ventricular volume overload. Indeterminate diastolic filling due to E-A fusion. Right Ventricle: The right ventricular size is normal. No increase in right ventricular wall thickness. Right ventricular systolic function is normal. Tricuspid regurgitation signal is inadequate for assessing PA pressure. Left Atrium: Left atrial size was normal in size. 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  The inferior vena cava is also dilated, but the right heart chambers are not enlarged. Right atrial size was normal in size. Pericardium: There is no evidence of pericardial effusion. Mitral Valve: The mitral valve is normal in structure. Normal mobility of the mitral valve leaflets. No evidence of mitral valve regurgitation. No evidence of mitral valve stenosis. Tricuspid Valve: The tricuspid valve is normal in structure. Tricuspid valve regurgitation is not demonstrated. No evidence of tricuspid stenosis. Aortic Valve: The aortic valve is normal in structure. Aortic valve regurgitation is not visualized. No aortic stenosis is present. Pulmonic Valve: The pulmonic valve was normal in structure. Pulmonic valve regurgitation is not visualized. No evidence of pulmonic stenosis. Aorta: The aortic root is normal in size and structure. Venous: The inferior vena cava is dilated in size with less than 50% respiratory variability, suggesting right atrial pressure of 15 mmHg. IAS/Shunts: No atrial level shunt detected by color flow Doppler.  LEFT VENTRICLE PLAX 2D LVIDd:  5.00 cm LVIDs:         3.80 cm LV PW:         1.10 cm LV IVS:        1.40 cm LVOT diam:     2.00 cm LV SV:         56 LV SV Index:   23 LVOT Area:     3.14 cm  RIGHT VENTRICLE RV S prime:     12.20 cm/s TAPSE (M-mode): 1.8 cm LEFT ATRIUM           Index       RIGHT ATRIUM           Index LA diam:      3.20 cm 1.32 cm/m  RA Area:     17.60 cm LA Vol (A2C): 45.3 ml 18.67 ml/m RA Volume:   51.10 ml  21.06 ml/m LA Vol (A4C): 47.7 ml 19.66 ml/m  AORTIC VALVE LVOT Vmax:   104.00 cm/s LVOT Vmean:  79.700 cm/s LVOT VTI:    0.179 m  AORTA Ao Root diam: 3.30 cm Ao Asc diam:  2.80 cm  SHUNTS Systemic VTI:  0.18 m Systemic Diam: 2.00 cm Thurmon Fair MD Electronically signed by Thurmon Fair MD Signature Date/Time: 05/31/2019/9:09:24 AM    Final    [place] EKG SITE RITE  Result Date: 05/31/2019 If Site Rite image not attached, placement could not be  confirmed due to current cardiac rhythm.  [place] MFM UA CORD DOPPLER  Result Date: 05/31/2019 ----------------------------------------------------------------------  OBSTETRICS REPORT                       (Signed Final 05/31/2019 06:01 pm) ---------------------------------------------------------------------- Patient Info  ID #:       [id number]                          D.O.B.:  1995-02-02 (23 yrs)  Name:       Kelli Mcpherson              Visit Date: 05/31/2019 09:27 am ---------------------------------------------------------------------- Performed By  Attending:        Ma Rings MD         Ref. Address:     [facility]                                                             [address]  Jacky Kindle                                                             [phone]  Performed By:     Tommie Raymond BS,       Secondary Phy.:   [facility]                    RDMS, RVT                                                             Care  Referred By:      Candice Camp MD          [facility] ---------------------------------------------------------------------- Orders  #  Description                           Code        Ordered By  1  [place] MFM OB DETAIL +14 WK               76811.01    MEGAN MORRIS  2  [place] MFM UA CORD DOPPLER                76820.02    MEGAN MORRIS  3  [place] MFM FETAL BPP WO NON               76819.01    MEGAN MORRIS     STRESS ----------------------------------------------------------------------  #  Order #                     Accession #                Episode #  1  [id number]                   [id number]  2  [id number]                   [id number]  3   [id number]                   [id number]                 [id number] ---------------------------------------------------------------------- Indications  Gestational diabetes in pregnancy,             O24.415  controlled by oral hypoglycemic drugs, DKA  Oligohydraminios, third trimester, unspecified O41.03X0  [redacted] weeks gestation of pregnancy  Z3A.32  Obesity complicating pregnancy, third          O99.213  trimester  Unspecified maternal hypertension, third       O16.3  trimester  COVID19 (+)  (4/27)  Encounter for antenatal screening for          Z36.3  malformations  Encounter for other antenatal screening        Z36.2  follow-up ---------------------------------------------------------------------- Fetal Evaluation  Num Of Fetuses:         1  Fetal Heart Rate(bpm):  142  Cardiac Activity:       Observed  Presentation:           Cephalic  Placenta:               Anterior  P. Cord Insertion:      Not well visualized  Amniotic Fluid  AFI FV:      Oligohydramnios  AFI Sum(cm)     %Tile       Largest Pocket(cm)  5.3             < 3         2.6  RUQ(cm)       RLQ(cm)       LUQ(cm)        LLQ(cm)  1.8           0             0.9            2.6 ---------------------------------------------------------------------- Biophysical Evaluation  Amniotic F.V:   Pocket => 2 cm             F. Tone:        Not Observed  F. Movement:    Observed                   Score:          6/8  F. Breathing:   Observed ---------------------------------------------------------------------- Biometry  BPD:      82.8  mm     G. Age:  33w 2d         76  %    CI:        83.92   %    70 - 86                                                          FL/HC:      21.8   %    19.1 - 21.3  HC:      284.9  mm     G. Age:  31w 2d        4.3  %    HC/AC:      0.98        0.96 - 1.17  AC:       292   mm     G. Age:  33w 1d         79  %    FL/BPD:     75.1   %    71 - 87  FL:       62.2  mm     G. Age:  32w 2d         39  %    FL/AC:  21.3   %    20  - 24  HUM:        57  mm     G. Age:  33w 1d         71  %  CER:      41.3  mm     G. Age:  35w 2d         87  %  LV:        4.5  mm  Est. FW:    2036  gm      4 lb 8 oz     58  % ---------------------------------------------------------------------- OB History  Gravidity:    1 ---------------------------------------------------------------------- Gestational Age  LMP:           32w 1d        Date:  10/18/18                 EDD:   07/25/19  U/S Today:     32w 4d                                        EDD:   07/22/19  Best:          32w 1d     Det. By:  LMP  (10/18/18)          EDD:   07/25/19 ---------------------------------------------------------------------- Anatomy  Cranium:               Not well visualized    LVOT:                   Appears normal  Cavum:                 Not well visualized    Aortic Arch:            Not well visualized  Ventricles:            Appears normal         Ductal Arch:            Appears normal  Choroid Plexus:        Appears normal         Diaphragm:              Appears normal  Cerebellum:            Not well visualized    Stomach:                Appears normal, left                                                                        sided  Posterior Fossa:       Not well visualized    Abdomen:                Appears normal  Nuchal Fold:           Not applicable (>20    Abdominal Wall:         Not well visualized  wks GA)  Face:                  Orbits nl; profile not Cord Vessels:           Appears normal (3                         well visualized                                vessel cord)  Lips:                  Not well visualized    Kidneys:                Appear normal  Palate:                Not well visualized    Bladder:                Appears normal  Thoracic:              Appears normal         Spine:                  Ltd views no                                                                        intracranial signs of                                                                         NTD  Heart:                 Appears normal         Upper Extremities:      Visualized                         (4CH, axis, and                         situs)  RVOT:                  Appears normal         Lower Extremities:      Visualized  Other:  Technically difficult due to maternal habitus and fetal position.          Technically difficult due to low amniotic fluid. ---------------------------------------------------------------------- Doppler - Fetal Vessels  Umbilical Artery   S/D     %tile      RI               PI                     ADFV    RDFV   2.96  67    0.66             0.88                        No      No ---------------------------------------------------------------------- Cervix Uterus Adnexa  Cervix  Not visualized (advanced GA >24wks)  Uterus  No abnormality visualized. ---------------------------------------------------------------------- Comments  This patient has been hospitalized due to diabetic  ketoacidosis and possible preeclampsia.  Her blood  pressures remain in the 120s to 150s over 80s to 90s range.  Her blood glucose levels are now in the normal range and her  diabetic ketoacidosis is resolving.  On today's exam, the overall fetal growth was appropriate for  her gestational age.  Oligohydramnios continues to be noted  today.  Doppler studies of the umbilical arteries performed today  shows a normal S/D ratio.  There were no signs of absent or  reversed end-diastolic flow.  A biophysical profile performed today was 6 out of 8.  She  received a -2 for absent fetal tone.  Her 24-hour urine results are currently pending.  Based on  her elevated blood pressures and her elevated P/C ratio, she  probably is developing preeclampsia.  Due to probable preeclampsia at her current gestational age,  now that her diabetic ketoacidosis is resolving, I would  recommend that she receive a complete course of antenatal  corticosteroids.  She should  be kept on IV insulin while she is  receiving her steroid course.  She should have another ultrasound later this week to  reassess the amniotic fluid levels. ----------------------------------------------------------------------                   Ma Rings, MD Electronically Signed Final Report   05/31/2019 06:01 pm ----------------------------------------------------------------------

## 2019-06-07 NOTE — Progress Notes (Signed)
Inpatient Diabetes Program Recommendations  AACE/ADA: New Consensus Statement on Inpatient Glycemic Control (2015)  Target Ranges:  Prepandial:   less than 140 mg/dL      Peak postprandial:   less than 180 mg/dL (1-2 hours)      Critically ill patients:  140 - 180 mg/dL   Lab Results  Component Value Date   GLUCAP 125 (H) 06/07/2019   HGBA1C 7.3 (H) 05/29/2019    Review of Glycemic Control Results for ALLYSSON, RINEHIMER" (MRN [id number]) as of 06/07/2019 09:10  Ref. Range 06/07/2019 03:58 06/07/2019 04:47 06/07/2019 08:10  Glucose-Capillary Latest Ref Range: 70 - 99 mg/dL 62 (L) 025 (H) 615 (H)   Current orders for Inpatient glycemic control:Novolog 0-16 units Q4H, Levemir 14 units QD, Novolog 4 units TID  Inpatient Diabetes Program Recommendations:  Noted mild hypoglycemic event of 62 mg/dL.  Additionally, in reviewing Freestyle Libre readings, assuming operating correctly. During hypoglycemic event yesterday, Freestyle readings were taken during hypoglycemic interventions and is "behind" the glucose from blood.   Consider changing correction Novolog 0-14 units TID (under Diabetes Treatment in Pregnancy order set).     Thanks, Lujean Rave, MSN, RNC-OB Diabetes Coordinator [phone] (8a-5p)

## 2019-06-08 ENCOUNTER — Encounter (HOSPITAL_COMMUNITY): Payer: Self-pay | Admitting: Anesthesiology

## 2019-06-08 ENCOUNTER — Inpatient Hospital Stay (HOSPITAL_COMMUNITY): Payer: Managed Care, Other (non HMO)

## 2019-06-08 ENCOUNTER — Other Ambulatory Visit: Payer: Self-pay

## 2019-06-08 ENCOUNTER — Encounter (HOSPITAL_COMMUNITY): Payer: Self-pay | Admitting: Obstetrics & Gynecology

## 2019-06-08 DIAGNOSIS — O1490 Unspecified pre-eclampsia, unspecified trimester: Secondary | ICD-10-CM

## 2019-06-08 HISTORY — DX: Unspecified pre-eclampsia, unspecified trimester: O14.90

## 2019-06-08 LAB — CBC
HCT: 28.9 % — ABNORMAL LOW (ref 36.0–46.0)
HCT: 31.2 % — ABNORMAL LOW (ref 36.0–46.0)
HCT: 32.1 % — ABNORMAL LOW (ref 36.0–46.0)
Hemoglobin: 9.1 g/dL — ABNORMAL LOW (ref 12.0–15.0)
Hemoglobin: 9.7 g/dL — ABNORMAL LOW (ref 12.0–15.0)
Hemoglobin: 9.9 g/dL — ABNORMAL LOW (ref 12.0–15.0)
MCH: 28.1 pg (ref 26.0–34.0)
MCH: 27.6 pg (ref 26.0–34.0)
MCH: 27.7 pg (ref 26.0–34.0)
MCHC: 31.5 g/dL (ref 30.0–36.0)
MCHC: 31.1 g/dL (ref 30.0–36.0)
MCHC: 30.8 g/dL (ref 30.0–36.0)
MCV: 89.2 fL (ref 80.0–100.0)
MCV: 88.6 fL (ref 80.0–100.0)
MCV: 89.9 fL (ref 80.0–100.0)
Platelets: 90 10*3/uL — ABNORMAL LOW (ref 150–400)
Platelets: 151 10*3/uL (ref 150–400)
Platelets: 161 10*3/uL (ref 150–400)
RBC: 3.24 MIL/uL — ABNORMAL LOW (ref 3.87–5.11)
RBC: 3.52 MIL/uL — ABNORMAL LOW (ref 3.87–5.11)
RBC: 3.57 MIL/uL — ABNORMAL LOW (ref 3.87–5.11)
RDW: 15.1 % (ref 11.5–15.5)
RDW: 15.1 % (ref 11.5–15.5)
RDW: 15 % (ref 11.5–15.5)
WBC: 5.5 10*3/uL (ref 4.0–10.5)
WBC: 6.8 10*3/uL (ref 4.0–10.5)
WBC: 6.4 10*3/uL (ref 4.0–10.5)
nRBC: 0.4 % — ABNORMAL HIGH (ref 0.0–0.2)
nRBC: 0.4 % — ABNORMAL HIGH (ref 0.0–0.2)
nRBC: 0 % (ref 0.0–0.2)

## 2019-06-08 LAB — TYPE AND SCREEN
ABO/RH(D): B POS
Antibody Screen: NEGATIVE

## 2019-06-08 LAB — GROUP B STREP BY PCR: Group B strep by PCR: POSITIVE — AB

## 2019-06-08 LAB — COMPREHENSIVE METABOLIC PANEL
ALT: 80 U/L — ABNORMAL HIGH (ref 0–44)
ALT: 84 U/L — ABNORMAL HIGH (ref 0–44)
ALT: 89 U/L — ABNORMAL HIGH (ref 0–44)
AST: 94 U/L — ABNORMAL HIGH (ref 15–41)
AST: 106 U/L — ABNORMAL HIGH (ref 15–41)
AST: 115 U/L — ABNORMAL HIGH (ref 15–41)
Albumin: 2.3 g/dL — ABNORMAL LOW (ref 3.5–5.0)
Albumin: 2.2 g/dL — ABNORMAL LOW (ref 3.5–5.0)
Albumin: 2.3 g/dL — ABNORMAL LOW (ref 3.5–5.0)
Alkaline Phosphatase: 102 U/L (ref 38–126)
Alkaline Phosphatase: 93 U/L (ref 38–126)
Alkaline Phosphatase: 93 U/L (ref 38–126)
Anion gap: 12 (ref 5–15)
Anion gap: 10 (ref 5–15)
Anion gap: 12 (ref 5–15)
BUN: <5 mg/dL — ABNORMAL LOW (ref 6–20)
BUN: <5 mg/dL — ABNORMAL LOW (ref 6–20)
BUN: <5 mg/dL — ABNORMAL LOW (ref 6–20)
CO2: 23 mmol/L (ref 22–32)
CO2: 26 mmol/L (ref 22–32)
CO2: 24 mmol/L (ref 22–32)
Calcium: 9 mg/dL (ref 8.9–10.3)
Calcium: 9.2 mg/dL (ref 8.9–10.3)
Calcium: 8.6 mg/dL — ABNORMAL LOW (ref 8.9–10.3)
Chloride: 102 mmol/L (ref 98–111)
Chloride: 103 mmol/L (ref 98–111)
Chloride: 101 mmol/L (ref 98–111)
Creatinine, Ser: 0.63 mg/dL (ref 0.44–1.00)
Creatinine, Ser: 0.74 mg/dL (ref 0.44–1.00)
Creatinine, Ser: 0.69 mg/dL (ref 0.44–1.00)
GFR calc Af Amer: >60 mL/min (ref >60)
GFR calc Af Amer: >60 mL/min (ref >60)
GFR calc Af Amer: >60 mL/min (ref >60)
GFR calc non Af Amer: >60 mL/min (ref >60)
GFR calc non Af Amer: >60 mL/min (ref >60)
GFR calc non Af Amer: >60 mL/min (ref >60)
Glucose, Bld: 100 mg/dL — ABNORMAL HIGH (ref 70–99)
Glucose, Bld: 84 mg/dL (ref 70–99)
Glucose, Bld: 82 mg/dL (ref 70–99)
Potassium: 3.5 mmol/L (ref 3.5–5.1)
Potassium: 4.1 mmol/L (ref 3.5–5.1)
Potassium: 3.8 mmol/L (ref 3.5–5.1)
Sodium: 137 mmol/L (ref 135–145)
Sodium: 139 mmol/L (ref 135–145)
Sodium: 137 mmol/L (ref 135–145)
Total Bilirubin: 0.6 mg/dL (ref 0.3–1.2)
Total Bilirubin: 0.6 mg/dL (ref 0.3–1.2)
Total Bilirubin: 0.7 mg/dL (ref 0.3–1.2)
Total Protein: 6.1 g/dL — ABNORMAL LOW (ref 6.5–8.1)
Total Protein: 6 g/dL — ABNORMAL LOW (ref 6.5–8.1)
Total Protein: 6.1 g/dL — ABNORMAL LOW (ref 6.5–8.1)

## 2019-06-08 LAB — GLUCOSE, CAPILLARY
Glucose-Capillary: 111 mg/dL — ABNORMAL HIGH (ref 70–99)
Glucose-Capillary: 115 mg/dL — ABNORMAL HIGH (ref 70–99)
Glucose-Capillary: 67 mg/dL — ABNORMAL LOW (ref 70–99)
Glucose-Capillary: 72 mg/dL (ref 70–99)
Glucose-Capillary: 75 mg/dL (ref 70–99)
Glucose-Capillary: 78 mg/dL (ref 70–99)
Glucose-Capillary: 82 mg/dL (ref 70–99)
Glucose-Capillary: 85 mg/dL (ref 70–99)
Glucose-Capillary: 86 mg/dL (ref 70–99)
Glucose-Capillary: 91 mg/dL (ref 70–99)

## 2019-06-08 MED ORDER — INSULIN ASPART 100 UNIT/ML ~~LOC~~ SOLN: 4.0000 [IU] | Freq: Three times a day (TID) | SUBCUTANEOUS | Status: DC

## 2019-06-08 MED ORDER — OXYCODONE-ACETAMINOPHEN 5-325 MG PO TABS: 2.0000 | ORAL_TABLET | ORAL | Status: DC | PRN

## 2019-06-08 MED ORDER — INSULIN DETEMIR 100 UNIT/ML ~~LOC~~ SOLN
8.0000 [IU] | Freq: Every day | SUBCUTANEOUS | Status: DC
  Filled 2019-06-08: qty 0.08

## 2019-06-08 MED ORDER — ONDANSETRON HCL 4 MG/2ML IJ SOLN: 4.0000 mg | Freq: Four times a day (QID) | INTRAMUSCULAR | Status: DC | PRN

## 2019-06-08 MED ORDER — MAGNESIUM SULFATE BOLUS VIA INFUSION
6.0000 g | Freq: Once | INTRAVENOUS | Status: AC
  Administered 2019-06-08: 6 g via INTRAVENOUS
  Filled 2019-06-08: qty 1000

## 2019-06-08 MED ORDER — OXYCODONE-ACETAMINOPHEN 5-325 MG PO TABS: 1.0000 | ORAL_TABLET | ORAL | Status: DC | PRN

## 2019-06-08 MED ORDER — TERBUTALINE SULFATE 1 MG/ML IJ SOLN: 0.2500 mg | Freq: Once | INTRAMUSCULAR | Status: DC | PRN

## 2019-06-08 MED ORDER — ACETAMINOPHEN 325 MG PO TABS
650.0000 mg | ORAL_TABLET | ORAL | Status: DC | PRN
  Administered 2019-06-09: 650 mg via ORAL
  Filled 2019-06-08: qty 2

## 2019-06-08 MED ORDER — INSULIN ASPART 100 UNIT/ML ~~LOC~~ SOLN
3.0000 [IU] | Freq: Three times a day (TID) | SUBCUTANEOUS | Status: DC
  Administered 2019-06-08: 3 [IU] via SUBCUTANEOUS

## 2019-06-08 MED ORDER — GENERIC EXTERNAL MEDICATION
Status: DC
Start: ? — End: 2019-06-08

## 2019-06-08 MED ORDER — LIDOCAINE HCL (PF) 1 % IJ SOLN: 30.0000 mL | INTRAMUSCULAR | Status: DC | PRN

## 2019-06-08 MED ORDER — SODIUM CHLORIDE 0.9 % IV SOLN
5.0000 10*6.[IU] | Freq: Once | INTRAVENOUS | Status: AC
  Administered 2019-06-08: 5 10*6.[IU] via INTRAVENOUS
  Filled 2019-06-08: qty 5

## 2019-06-08 MED ORDER — FENTANYL CITRATE (PF) 100 MCG/2ML IJ SOLN
50.0000 ug | INTRAMUSCULAR | Status: DC | PRN
  Administered 2019-06-09: 100 ug via INTRAVENOUS
  Filled 2019-06-08: qty 2

## 2019-06-08 MED ORDER — DEXTROSE 50 % IV SOLN: 0.0000 mL | INTRAVENOUS | Status: DC | PRN

## 2019-06-08 MED ORDER — MAGNESIUM SULFATE 40 GM/1000ML IV SOLN
2.0000 g/h | INTRAVENOUS | Status: DC
  Administered 2019-06-09: 2 g/h via INTRAVENOUS
  Filled 2019-06-08 (×2): qty 1000

## 2019-06-08 MED ORDER — OXYTOCIN BOLUS FROM INFUSION
500.0000 mL | Freq: Once | INTRAVENOUS | Status: AC
  Administered 2019-06-09: 500 mL via INTRAVENOUS

## 2019-06-08 MED ORDER — MISOPROSTOL 25 MCG QUARTER TABLET
25.0000 ug | ORAL_TABLET | ORAL | Status: DC | PRN
  Administered 2019-06-08 (×3): 25 ug via VAGINAL
  Filled 2019-06-08 (×3): qty 1

## 2019-06-08 MED ORDER — SOD CITRATE-CITRIC ACID 500-334 MG/5ML PO SOLN: 30.0000 mL | ORAL | Status: DC | PRN

## 2019-06-08 MED ORDER — INSULIN REGULAR(HUMAN) IN NACL 100-0.9 UT/100ML-% IV SOLN: INTRAVENOUS | Status: DC

## 2019-06-08 MED ORDER — OXYTOCIN 40 UNITS IN NORMAL SALINE INFUSION - SIMPLE MED
2.5000 [IU]/h | INTRAVENOUS | Status: DC
  Administered 2019-06-09: 2.5 [IU]/h via INTRAVENOUS
  Filled 2019-06-08: qty 1000

## 2019-06-08 MED ORDER — LACTATED RINGERS IV SOLN: INTRAVENOUS | Status: DC

## 2019-06-08 MED ORDER — PENICILLIN G POT IN DEXTROSE 60000 UNIT/ML IV SOLN
3.0000 10*6.[IU] | INTRAVENOUS | Status: DC
  Administered 2019-06-08 – 2019-06-09 (×4): 3 10*6.[IU] via INTRAVENOUS
  Filled 2019-06-08 (×4): qty 50

## 2019-06-08 MED ORDER — LACTATED RINGERS IV SOLN: 500.0000 mL | INTRAVENOUS | Status: DC | PRN

## 2019-06-08 NOTE — Progress Notes (Addendum)
Subjective: Patient reports no c/o.  Denies HA, vision change, RUQ pain, CP/SOB.  Active FM.    Objective: I have reviewed patient's vital signs, intake and output, medications and labs.  PLT 139-->90 CMP pending  Gen: A&O x 3, eating breakfast Abd: soft, NT Ext: no c/c, 1+edema, no clonus and 2+DTRs  EFM: Cat 1 Toco: flat   Assessment/Plan:  Imp/Plan: A/P: 24 yo G1P0 @33  2/7egaoriginally presented with DKA and COVID now recovering and with incidentally found PIH with new severe features (thrombocytopenia).  Preeclampsia with severe features- Given new thrombocytopenia this morning, I recommend delivery.  Patient is counseled for this recommendation and is in agreement.  She is counseled re: risk of low platelets including increased risk of bleeding and possible inability to receive regional anesthesia.  She expresses understanding.  She is counseled re: cytotec induction with expectation for steady progress and close lab monitoring.  May need to proceed with C/S should prolonged induction seem likely.  Will order transfer to L&D and start magnesium sulfate for seizure prophylaxis.  Fetal Well Being - Reassuring EFM; vtx presentation BMZ series completed 06/03/19  DM-DKA has resolved.  Plan Glucomander in labor.  Covid positive 05/17/19) Doing well clinically s/p Remdesivir    LOS: 10 days    05/19/19 06/08/2019, 8:34 AM

## 2019-06-08 NOTE — Progress Notes (Signed)
LUA PICC assessed and noted to have catheter kinked at site on dressing, 3 cm more out, flushed ports x 2 with NBR. Dressing changed, 5cm out in total at this time, flushed easily with GBR. RN aware. CXR ordered. Will follow up.

## 2019-06-08 NOTE — Progress Notes (Signed)
Transferred to L&D  Report given to stephanie Alberteen Spindle

## 2019-06-08 NOTE — Progress Notes (Signed)
Noted plans for delivery due to severe preeclampsia-she is now on a insulin infusion.  Hospitalist service will sign off-please reconsult our service if we can add to her care while she is postpartum.

## 2019-06-08 NOTE — Progress Notes (Signed)
Kelli Mcpherson is a 24 y.o. G1P0 at [redacted]w[redacted]d by ultrasound admitted for induction of labor due to pre-eclampsia with severe features.  Subjective: No c/o.   Objective: BP (!) 145/76   Pulse 100   Temp 98.6 F (37 C) (Axillary)   Resp 18   Ht 6' (1.829 m)   Wt 124 kg   LMP 07/31/2018 (Approximate) Comment: pt shielded  SpO2 96%   BMI 37.08 kg/m  I/O last 3 completed shifts: In: 4320 [P.O.:4320] Out: 5350 [Urine:5350] Total I/O In: 1194 [P.O.:480; I.V.:414; IV Piggyback:300] Out: 500 [Urine:500]  FHT:  FHR: 130 bpm, variability: moderate,  accelerations:  Present,  decelerations:  Absent UC:   irregular, every 5 minutes SVE:   Dilation: 1 Effacement (%): 50 Station: -3 Exam by:: Henderson Newcomer, RN  Labs: Lab Results  Component Value Date   WBC 6.4 06/08/2019   HGB 9.9 (L) 06/08/2019   HCT 32.1 (L) 06/08/2019   MCV 89.9 06/08/2019   PLT 161 06/08/2019    Assessment / Plan: Induction of labor for pre-eclampsia with severe features  Labor: Progressing normally Preeclampsia:  on magnesium sulfate and no signs or symptoms of toxicity.  Labs q 6. Fetal Wellbeing:  Category I Pain Control:  Labor support without medications I/D:  n/a Anticipated MOD:  NSVD  DM: excellent glycemic control  Mitchel Honour 06/08/2019, 6:22 PM

## 2019-06-08 NOTE — Progress Notes (Signed)
CVX 1/50/-3, Foley bulb placed Will continue cytotec  Mitchel Honour, DO

## 2019-06-08 NOTE — Progress Notes (Signed)
Kelli Mcpherson is a 24 y.o. G1P0 at [redacted]w[redacted]d by ultrasound induced for pre-eclampsia with severe features.    Subjective: No complaints.  VMP#1 placed at 1223 and patient is not feeling cramping yet.  No HA, CP/SOB, RUQ pain, or visual disturbance.  Objective: BP 136/72   Pulse 100   Temp 98.6 F (37 C) (Axillary)   Resp 18   Ht 6' (1.829 m)   Wt 123.2 kg   LMP 07/31/2018 (Approximate) Comment: pt shielded  SpO2 96%   BMI 36.84 kg/m  I/O last 3 completed shifts: In: 4320 [P.O.:4320] Out: 5350 [Urine:5350] Total I/O In: 480 [P.O.:480] Out: 500 [Urine:500]  FHT:  FHR: 140 bpm, variability: moderate,  accelerations:  Present,  decelerations:  Absent UC:   none SVE:   Dilation: 1 Effacement (%): 50 Station: -3 Exam by:: Henderson Newcomer, RN  Labs: Lab Results  Component Value Date   WBC 6.8 06/08/2019   HGB 9.7 (L) 06/08/2019   HCT 31.2 (L) 06/08/2019   MCV 88.6 06/08/2019   PLT 151 06/08/2019  CBG <120 PLT 90-->150  AST  94-->106 ALT  80-->84  Assessment / Plan: Induction of labor due to pre-eclampsia with severe features  Labor: Progressing normally Preeclampsia:  on magnesium sulfate and no signs or symptoms of toxicity; labs q 6h.  IV anti-hypertensives prn. Fetal Wellbeing:  Category I Pain Control:  Labor support without medications; to consider dry cath I/D:  n/a Anticipated MOD:  NSVD  DM-no need to start Endotool yet.  Will initiate if CBG >120  Mitchel Honour 06/08/2019, 1:35 PM

## 2019-06-08 NOTE — Progress Notes (Signed)
At 2355, cbg was 66 without any symptoms. Pt was given a snack and cbg was rechecked at 0034. Pt cbg was 86. Will continue to monitor.

## 2019-06-08 NOTE — Progress Notes (Signed)
Inpatient Diabetes Program Recommendations  AACE/ADA: New Consensus Statement on Inpatient Glycemic Control (2015)  Target Ranges:  Prepandial:   less than 140 mg/dL      Peak postprandial:   less than 180 mg/dL (1-2 hours)      Critically ill patients:  140 - 180 mg/dL   Lab Results  Component Value Date   GLUCAP 82 06/08/2019   HGBA1C 7.3 (H) 05/29/2019    Review of Glycemic Control Results for KANANI, MOWBRAY" (MRN [id number]) as of 06/08/2019 12:44  Ref. Range 06/08/2019 00:34 06/08/2019 06:46 06/08/2019 09:59 06/08/2019 11:13  Glucose-Capillary Latest Ref Range: 70 - 99 mg/dL 86 012 (H) 91 82   Diabetes history: GDM Current orders for Inpatient glycemic control: IV insulin  Inpatient Diabetes Program Recommendations:    Noted indication for IOL and changes to orders. Agree with current orders.  In preparation for plan following delivery, would recommend Novolog 0-9 units Q4H.   Thanks, Lujean Rave, MSN, RNC-OB Diabetes Coordinator [phone] (8a-5p)

## 2019-06-09 ENCOUNTER — Inpatient Hospital Stay (HOSPITAL_COMMUNITY): Payer: Managed Care, Other (non HMO) | Admitting: Anesthesiology

## 2019-06-09 ENCOUNTER — Encounter (HOSPITAL_COMMUNITY): Payer: Self-pay | Admitting: Obstetrics & Gynecology

## 2019-06-09 DIAGNOSIS — O1493 Unspecified pre-eclampsia, third trimester: Secondary | ICD-10-CM

## 2019-06-09 LAB — CBC
HCT: 32 % — ABNORMAL LOW (ref 36.0–46.0)
HCT: 30.5 % — ABNORMAL LOW (ref 36.0–46.0)
HCT: 29.4 % — ABNORMAL LOW (ref 36.0–46.0)
HCT: 32.8 % — ABNORMAL LOW (ref 36.0–46.0)
Hemoglobin: 10.1 g/dL — ABNORMAL LOW (ref 12.0–15.0)
Hemoglobin: 9.3 g/dL — ABNORMAL LOW (ref 12.0–15.0)
Hemoglobin: 8.9 g/dL — ABNORMAL LOW (ref 12.0–15.0)
Hemoglobin: 10.2 g/dL — ABNORMAL LOW (ref 12.0–15.0)
MCH: 28.2 pg (ref 26.0–34.0)
MCH: 27.7 pg (ref 26.0–34.0)
MCH: 28.3 pg (ref 26.0–34.0)
MCH: 27.8 pg (ref 26.0–34.0)
MCHC: 31.6 g/dL (ref 30.0–36.0)
MCHC: 30.5 g/dL (ref 30.0–36.0)
MCHC: 30.3 g/dL (ref 30.0–36.0)
MCHC: 31.1 g/dL (ref 30.0–36.0)
MCV: 89.4 fL (ref 80.0–100.0)
MCV: 90.8 fL (ref 80.0–100.0)
MCV: 93.3 fL (ref 80.0–100.0)
MCV: 89.4 fL (ref 80.0–100.0)
Platelets: 165 10*3/uL (ref 150–400)
Platelets: 149 10*3/uL — ABNORMAL LOW (ref 150–400)
Platelets: UNDETERMINED 10*3/uL (ref 150–400)
Platelets: 155 10*3/uL (ref 150–400)
RBC: 3.58 MIL/uL — ABNORMAL LOW (ref 3.87–5.11)
RBC: 3.36 MIL/uL — ABNORMAL LOW (ref 3.87–5.11)
RBC: 3.15 MIL/uL — ABNORMAL LOW (ref 3.87–5.11)
RBC: 3.67 MIL/uL — ABNORMAL LOW (ref 3.87–5.11)
RDW: 15.1 % (ref 11.5–15.5)
RDW: 15.1 % (ref 11.5–15.5)
RDW: 15.1 % (ref 11.5–15.5)
RDW: 15.1 % (ref 11.5–15.5)
WBC: 6.7 10*3/uL (ref 4.0–10.5)
WBC: 6.7 10*3/uL (ref 4.0–10.5)
WBC: 8 10*3/uL (ref 4.0–10.5)
WBC: 11.9 10*3/uL — ABNORMAL HIGH (ref 4.0–10.5)
nRBC: 0 % (ref 0.0–0.2)
nRBC: 0 % (ref 0.0–0.2)
nRBC: 0 % (ref 0.0–0.2)
nRBC: 0 % (ref 0.0–0.2)

## 2019-06-09 LAB — COMPREHENSIVE METABOLIC PANEL
ALT: 102 U/L — ABNORMAL HIGH (ref 0–44)
ALT: 119 U/L — ABNORMAL HIGH (ref 0–44)
AST: 132 U/L — ABNORMAL HIGH (ref 15–41)
AST: 160 U/L — ABNORMAL HIGH (ref 15–41)
Albumin: 2 g/dL — ABNORMAL LOW (ref 3.5–5.0)
Albumin: 2.3 g/dL — ABNORMAL LOW (ref 3.5–5.0)
Alkaline Phosphatase: 96 U/L (ref 38–126)
Alkaline Phosphatase: 96 U/L (ref 38–126)
Anion gap: 8 (ref 5–15)
Anion gap: 11 (ref 5–15)
BUN: <5 mg/dL — ABNORMAL LOW (ref 6–20)
BUN: <5 mg/dL — ABNORMAL LOW (ref 6–20)
CO2: 27 mmol/L (ref 22–32)
CO2: 23 mmol/L (ref 22–32)
Calcium: 8.3 mg/dL — ABNORMAL LOW (ref 8.9–10.3)
Calcium: 8.6 mg/dL — ABNORMAL LOW (ref 8.9–10.3)
Chloride: 103 mmol/L (ref 98–111)
Chloride: 103 mmol/L (ref 98–111)
Creatinine, Ser: 0.55 mg/dL (ref 0.44–1.00)
Creatinine, Ser: 0.66 mg/dL (ref 0.44–1.00)
GFR calc Af Amer: >60 mL/min (ref >60)
GFR calc Af Amer: >60 mL/min (ref >60)
GFR calc non Af Amer: >60 mL/min (ref >60)
GFR calc non Af Amer: >60 mL/min (ref >60)
Glucose, Bld: 93 mg/dL (ref 70–99)
Glucose, Bld: 104 mg/dL — ABNORMAL HIGH (ref 70–99)
Potassium: 3.8 mmol/L (ref 3.5–5.1)
Potassium: 4.4 mmol/L (ref 3.5–5.1)
Sodium: 138 mmol/L (ref 135–145)
Sodium: 137 mmol/L (ref 135–145)
Total Bilirubin: 0.9 mg/dL (ref 0.3–1.2)
Total Bilirubin: 0.6 mg/dL (ref 0.3–1.2)
Total Protein: 5.8 g/dL — ABNORMAL LOW (ref 6.5–8.1)
Total Protein: 6 g/dL — ABNORMAL LOW (ref 6.5–8.1)

## 2019-06-09 LAB — GLUCOSE, CAPILLARY
Glucose-Capillary: 90 mg/dL (ref 70–99)
Glucose-Capillary: 77 mg/dL (ref 70–99)
Glucose-Capillary: 110 mg/dL — ABNORMAL HIGH (ref 70–99)
Glucose-Capillary: 91 mg/dL (ref 70–99)

## 2019-06-09 LAB — RPR: RPR Ser Ql: NONREACTIVE

## 2019-06-09 MED ORDER — LIDOCAINE HCL (PF) 1 % IJ SOLN
INTRAMUSCULAR | Status: DC | PRN
  Administered 2019-06-09: 11 mL via EPIDURAL

## 2019-06-09 MED ORDER — FENTANYL-BUPIVACAINE-NACL 0.5-0.125-0.9 MG/250ML-% EP SOLN
12.0000 mL/h | EPIDURAL | Status: DC | PRN
  Filled 2019-06-09: qty 250

## 2019-06-09 MED ORDER — LACTATED RINGERS IV SOLN: INTRAVENOUS | Status: DC

## 2019-06-09 MED ORDER — LACTATED RINGERS IV SOLN: 500.0000 mL | Freq: Once | INTRAVENOUS | Status: DC

## 2019-06-09 MED ORDER — SODIUM CHLORIDE (PF) 0.9 % IJ SOLN
INTRAMUSCULAR | Status: DC | PRN
  Administered 2019-06-09: 12 mL/h via EPIDURAL

## 2019-06-09 MED ORDER — PRENATAL MULTIVITAMIN CH
1.0000 | ORAL_TABLET | Freq: Every day | ORAL | Status: DC
  Administered 2019-06-10 – 2019-06-11 (×2): 1 via ORAL
  Filled 2019-06-09 (×2): qty 1

## 2019-06-09 MED ORDER — PHENYLEPHRINE 40 MCG/ML (10ML) SYRINGE FOR IV PUSH (FOR BLOOD PRESSURE SUPPORT): 80.0000 ug | PREFILLED_SYRINGE | INTRAVENOUS | Status: DC | PRN

## 2019-06-09 MED ORDER — DIPHENHYDRAMINE HCL 25 MG PO CAPS: 25.0000 mg | ORAL_CAPSULE | Freq: Four times a day (QID) | ORAL | Status: DC | PRN

## 2019-06-09 MED ORDER — ONDANSETRON HCL 4 MG PO TABS: 4.0000 mg | ORAL_TABLET | ORAL | Status: DC | PRN

## 2019-06-09 MED ORDER — BENZOCAINE-MENTHOL 20-0.5 % EX AERO: 1.0000 "application " | INHALATION_SPRAY | CUTANEOUS | Status: DC | PRN

## 2019-06-09 MED ORDER — BACITRACIN-NEOMYCIN-POLYMYXIN OINTMENT TUBE
TOPICAL_OINTMENT | CUTANEOUS | Status: DC | PRN
  Filled 2019-06-09: qty 14

## 2019-06-09 MED ORDER — OXYCODONE HCL 5 MG PO TABS: 5.0000 mg | ORAL_TABLET | ORAL | Status: DC | PRN

## 2019-06-09 MED ORDER — OXYCODONE HCL 5 MG PO TABS: 10.0000 mg | ORAL_TABLET | ORAL | Status: DC | PRN

## 2019-06-09 MED ORDER — TETANUS-DIPHTH-ACELL PERTUSSIS 5-2.5-18.5 LF-MCG/0.5 IM SUSP: 0.5000 mL | Freq: Once | INTRAMUSCULAR | Status: DC

## 2019-06-09 MED ORDER — ZOLPIDEM TARTRATE 5 MG PO TABS
5.0000 mg | ORAL_TABLET | Freq: Every evening | ORAL | Status: DC | PRN
  Administered 2019-06-10: 5 mg via ORAL
  Filled 2019-06-09: qty 1

## 2019-06-09 MED ORDER — ONDANSETRON HCL 4 MG/2ML IJ SOLN: 4.0000 mg | INTRAMUSCULAR | Status: DC | PRN

## 2019-06-09 MED ORDER — SENNOSIDES-DOCUSATE SODIUM 8.6-50 MG PO TABS
2.0000 | ORAL_TABLET | ORAL | Status: DC
  Administered 2019-06-10 (×2): 2 via ORAL
  Filled 2019-06-09 (×2): qty 2

## 2019-06-09 MED ORDER — EPHEDRINE 5 MG/ML INJ: 10.0000 mg | INTRAVENOUS | Status: DC | PRN

## 2019-06-09 MED ORDER — OXYTOCIN 40 UNITS IN NORMAL SALINE INFUSION - SIMPLE MED
1.0000 m[IU]/min | INTRAVENOUS | Status: DC
  Administered 2019-06-09: 6 m[IU]/min via INTRAVENOUS
  Administered 2019-06-09: 14 m[IU]/min via INTRAVENOUS
  Administered 2019-06-09: 10 m[IU]/min via INTRAVENOUS
  Administered 2019-06-09: 2 m[IU]/min via INTRAVENOUS
  Administered 2019-06-09: 8 m[IU]/min via INTRAVENOUS
  Administered 2019-06-09: 4 m[IU]/min via INTRAVENOUS

## 2019-06-09 MED ORDER — WITCH HAZEL-GLYCERIN EX PADS: 1.0000 "application " | MEDICATED_PAD | CUTANEOUS | Status: DC | PRN

## 2019-06-09 MED ORDER — DIPHENHYDRAMINE HCL 50 MG/ML IJ SOLN: 12.5000 mg | INTRAMUSCULAR | Status: DC | PRN

## 2019-06-09 MED ORDER — ACETAMINOPHEN 325 MG PO TABS
650.0000 mg | ORAL_TABLET | ORAL | Status: DC | PRN
  Administered 2019-06-10 – 2019-06-11 (×2): 650 mg via ORAL
  Filled 2019-06-09 (×2): qty 2

## 2019-06-09 MED ORDER — DIBUCAINE (PERIANAL) 1 % EX OINT: 1.0000 "application " | TOPICAL_OINTMENT | CUTANEOUS | Status: DC | PRN

## 2019-06-09 MED ORDER — MAGNESIUM SULFATE 40 GM/1000ML IV SOLN
2.0000 g/h | INTRAVENOUS | Status: DC
  Administered 2019-06-10: 2 g/h via INTRAVENOUS
  Filled 2019-06-09: qty 1000

## 2019-06-09 MED ORDER — IBUPROFEN 600 MG PO TABS
600.0000 mg | ORAL_TABLET | Freq: Four times a day (QID) | ORAL | Status: DC
  Administered 2019-06-09 – 2019-06-11 (×7): 600 mg via ORAL
  Filled 2019-06-09 (×8): qty 1

## 2019-06-09 MED ORDER — COCONUT OIL OIL: 1.0000 "application " | TOPICAL_OIL | Status: DC | PRN

## 2019-06-09 MED ORDER — SIMETHICONE 80 MG PO CHEW: 80.0000 mg | CHEWABLE_TABLET | ORAL | Status: DC | PRN

## 2019-06-09 NOTE — Lactation Note (Signed)
This note was copied from a baby's chart. Lactation Consultation Note  Patient Name: Kelli Mcpherson Date: 06/09/2019   G1p1.  [name] Kelli Mcpherson born at 33 weeks and 3 days currently in NICU.  Mom reports she wants to provide her breastmilk for him.  Baby with negative UDS. Inititiated pumping with mom using DEBP. Mom pumped for 15 minutes on the intitate setting.  Didn't get anything with pumping.  Demo massage and hand expression.  Able to get a drop on each breast with hand expression.   But unable to collect.  Reviewed NICU booklet.  Urged mom to pump 8-12 times day for 15 minutes.  Mom reports she really wants to sleep tonight.  Urged her not go any longer than 4-5 hours tonight with out pumping and try to pump more often in the day. Mom does not have a DEBP for home use.  Mom has both Doctor, general practice and is on [facility] in [place].  [facility] referral sent.  Urged mom to call lactation as needed.     Maternal Data    Feeding Feeding Type: Donor Breast Milk  LATCH Score                   Interventions    Lactation Tools Discussed/Used     Consult Status      Kelli Mcpherson 06/09/2019, 9:11 PM

## 2019-06-09 NOTE — Anesthesia Preprocedure Evaluation (Signed)
Anesthesia Evaluation  Patient identified by MRN, date of birth, ID band Patient awake    Reviewed: Allergy & Precautions, NPO status , Patient's Chart, lab work & pertinent test results  Airway Mallampati: II  TM Distance: >3 FB Neck ROM: Full    Dental no notable dental hx.    Pulmonary neg pulmonary ROS, former smoker,    Pulmonary exam normal breath sounds clear to auscultation       Cardiovascular hypertension, Normal cardiovascular exam Rhythm:Regular Rate:Normal     Neuro/Psych  Headaches, negative psych ROS   GI/Hepatic negative GI ROS, Neg liver ROS,   Endo/Other  negative endocrine ROSdiabetes  Renal/GU negative Renal ROS  negative genitourinary   Musculoskeletal negative musculoskeletal ROS (+)   Abdominal (+) + obese,   Peds negative pediatric ROS (+)  Hematology negative hematology ROS (+)   Anesthesia Other Findings   Reproductive/Obstetrics (+) Pregnancy                             Anesthesia Physical Anesthesia Plan  ASA: III  Anesthesia Plan: Epidural   Post-op Pain Management:    Induction: Intravenous  PONV Risk Score and Plan:   Airway Management Planned:   Additional Equipment:   Intra-op Plan:   Post-operative Plan:   Informed Consent: I have reviewed the patients History and Physical, chart, labs and discussed the procedure including the risks, benefits and alternatives for the proposed anesthesia with the patient or authorized representative who has indicated his/her understanding and acceptance.       Plan Discussed with:   Anesthesia Plan Comments:         Anesthesia Quick Evaluation

## 2019-06-09 NOTE — Progress Notes (Signed)
Feeling good No HA or vision change No epigastric pain  Today's Vitals   06/09/19 0625 06/09/19 0700 06/09/19 0704 06/09/19 0724  BP: 117/60 (!) 123/93 (!) 122/92 139/84  Pulse: 79  (!) 103 (!) 117  Resp: 16  18   Temp:      TempSrc:      SpO2:      Weight:      Height:      PainSc: 2   0-No pain    Body mass index is 37.08 kg/m.  Abdomen NT DTR 2+  Cx 5/70/-2/vtx well applied AROM > clear  FHT cat one  Results for orders placed or performed during the hospital encounter of 05/29/19 (from the past 24 hour(s))  Group B strep by PCR     Status: Abnormal   Collection Time: 06/08/19  9:37 AM   Specimen: Vaginal/Rectal; Genital  Result Value Ref Range   Group B strep by PCR POSITIVE (A) NEGATIVE  Glucose, capillary     Status: None   Collection Time: 06/08/19  9:59 AM  Result Value Ref Range   Glucose-Capillary 91 70 - 99 mg/dL  Glucose, capillary     Status: None   Collection Time: 06/08/19 11:13 AM  Result Value Ref Range   Glucose-Capillary 82 70 - 99 mg/dL  Type and screen [facility]     Status: None   Collection Time: 06/08/19 11:42 AM  Result Value Ref Range   ABO/RH(D) B POS    Antibody Screen NEG    Sample Expiration      06/11/2019,2359 Performed at [facility], [address], [place], [place] [address]   CBC     Status: Abnormal   Collection Time: 06/08/19 11:47 AM  Result Value Ref Range   WBC 6.8 4.0 - 10.5 K/uL   RBC 3.52 (L) 3.87 - 5.11 MIL/uL   Hemoglobin 9.7 (L) 12.0 - 15.0 g/dL   HCT 92.1 (L) 19.4 - 17.4 %   MCV 88.6 80.0 - 100.0 fL   MCH 27.6 26.0 - 34.0 pg   MCHC 31.1 30.0 - 36.0 g/dL   RDW 08.1 44.8 - 18.5 %   Platelets 151 150 - 400 K/uL   nRBC 0.4 (H) 0.0 - 0.2 %  Comprehensive metabolic panel     Status: Abnormal   Collection Time: 06/08/19 11:47 AM  Result Value Ref Range   Sodium 139 135 - 145 mmol/L   Potassium 4.1 3.5 - 5.1 mmol/L   Chloride 103 98 - 111 mmol/L   CO2 26 22 - 32 mmol/L   Glucose, Bld  84 70 - 99 mg/dL   BUN <5 (L) 6 - 20 mg/dL   Creatinine, Ser 6.31 0.44 - 1.00 mg/dL   Calcium 9.2 8.9 - 49.7 mg/dL   Total Protein 6.0 (L) 6.5 - 8.1 g/dL   Albumin 2.2 (L) 3.5 - 5.0 g/dL   AST 026 (H) 15 - 41 U/L   ALT 84 (H) 0 - 44 U/L   Alkaline Phosphatase 93 38 - 126 U/L   Total Bilirubin 0.6 0.3 - 1.2 mg/dL   GFR calc non Af Amer >60 >60 mL/min   GFR calc Af Amer >60 >60 mL/min   Anion gap 10 5 - 15  Glucose, capillary     Status: None   Collection Time: 06/08/19  1:14 PM  Result Value Ref Range   Glucose-Capillary 75 70 - 99 mg/dL  Glucose, capillary  Status: None   Collection Time: 06/08/19  2:32 PM  Result Value Ref Range   Glucose-Capillary 78 70 - 99 mg/dL  CBC     Status: Abnormal   Collection Time: 06/08/19  5:36 PM  Result Value Ref Range   WBC 6.4 4.0 - 10.5 K/uL   RBC 3.57 (L) 3.87 - 5.11 MIL/uL   Hemoglobin 9.9 (L) 12.0 - 15.0 g/dL   HCT 32.1 (L) 36.0 - 46.0 %   MCV 89.9 80.0 - 100.0 fL   MCH 27.7 26.0 - 34.0 pg   MCHC 30.8 30.0 - 36.0 g/dL   RDW 15.0 11.5 - 15.5 %   Platelets 161 150 - 400 K/uL   nRBC 0.0 0.0 - 0.2 %  Comprehensive metabolic panel     Status: Abnormal   Collection Time: 06/08/19  5:36 PM  Result Value Ref Range   Sodium 137 135 - 145 mmol/L   Potassium 3.8 3.5 - 5.1 mmol/L   Chloride 101 98 - 111 mmol/L   CO2 24 22 - 32 mmol/L   Glucose, Bld 82 70 - 99 mg/dL   BUN <5 (L) 6 - 20 mg/dL   Creatinine, Ser 0.69 0.44 - 1.00 mg/dL   Calcium 8.6 (L) 8.9 - 10.3 mg/dL   Total Protein 6.1 (L) 6.5 - 8.1 g/dL   Albumin 2.3 (L) 3.5 - 5.0 g/dL   AST 115 (H) 15 - 41 U/L   ALT 89 (H) 0 - 44 U/L   Alkaline Phosphatase 93 38 - 126 U/L   Total Bilirubin 0.7 0.3 - 1.2 mg/dL   GFR calc non Af Amer >60 >60 mL/min   GFR calc Af Amer >60 >60 mL/min   Anion gap 12 5 - 15  Glucose, capillary     Status: None   Collection Time: 06/08/19  5:49 PM  Result Value Ref Range   Glucose-Capillary 85 70 - 99 mg/dL  Glucose, capillary     Status: Abnormal    Collection Time: 06/08/19  7:39 PM  Result Value Ref Range   Glucose-Capillary 67 (L) 70 - 99 mg/dL  CBC     Status: Abnormal   Collection Time: 06/08/19 11:29 PM  Result Value Ref Range   WBC 6.7 4.0 - 10.5 K/uL   RBC 3.58 (L) 3.87 - 5.11 MIL/uL   Hemoglobin 10.1 (L) 12.0 - 15.0 g/dL   HCT 32.0 (L) 36.0 - 46.0 %   MCV 89.4 80.0 - 100.0 fL   MCH 28.2 26.0 - 34.0 pg   MCHC 31.6 30.0 - 36.0 g/dL   RDW 15.1 11.5 - 15.5 %   Platelets 165 150 - 400 K/uL   nRBC 0.0 0.0 - 0.2 %  Glucose, capillary     Status: None   Collection Time: 06/08/19 11:32 PM  Result Value Ref Range   Glucose-Capillary 72 70 - 99 mg/dL  Glucose, capillary     Status: None   Collection Time: 06/09/19  3:26 AM  Result Value Ref Range   Glucose-Capillary 90 70 - 99 mg/dL  CBC     Status: Abnormal   Collection Time: 06/09/19  6:02 AM  Result Value Ref Range   WBC 6.7 4.0 - 10.5 K/uL   RBC 3.36 (L) 3.87 - 5.11 MIL/uL   Hemoglobin 9.3 (L) 12.0 - 15.0 g/dL   HCT 30.5 (L) 36.0 - 46.0 %   MCV 90.8 80.0 - 100.0 fL   MCH 27.7 26.0 - 34.0 pg   MCHC 30.5  30.0 - 36.0 g/dL   RDW 70.0 17.4 - 94.4 %   Platelets 149 (L) 150 - 400 K/uL   nRBC 0.0 0.0 - 0.2 %  Comprehensive metabolic panel     Status: Abnormal   Collection Time: 06/09/19  6:02 AM  Result Value Ref Range   Sodium 138 135 - 145 mmol/L   Potassium 3.8 3.5 - 5.1 mmol/L   Chloride 103 98 - 111 mmol/L   CO2 27 22 - 32 mmol/L   Glucose, Bld 93 70 - 99 mg/dL   BUN <5 (L) 6 - 20 mg/dL   Creatinine, Ser 9.67 0.44 - 1.00 mg/dL   Calcium 8.3 (L) 8.9 - 10.3 mg/dL   Total Protein 5.8 (L) 6.5 - 8.1 g/dL   Albumin 2.0 (L) 3.5 - 5.0 g/dL   AST 591 (H) 15 - 41 U/L   ALT 102 (H) 0 - 44 U/L   Alkaline Phosphatase 96 38 - 126 U/L   Total Bilirubin 0.9 0.3 - 1.2 mg/dL   GFR calc non Af Amer >60 >60 mL/min   GFR calc Af Amer >60 >60 mL/min   Anion gap 8 5 - 15  Glucose, capillary     Status: None   Collection Time: 06/09/19  6:31 AM  Result Value Ref Range    Glucose-Capillary 77 70 - 99 mg/dL   Magnesium sulfate running Pitocin running  D/W patient plan for vaginal delivery and PP magnesium sulfate for seizure prophylaxis

## 2019-06-09 NOTE — Anesthesia Procedure Notes (Signed)
Epidural Patient location during procedure: OB Start time: 06/09/2019 11:38 AM End time: 06/09/2019 11:50 AM  Staffing Anesthesiologist: Lowella Curb, MD Performed: anesthesiologist   Preanesthetic Checklist Completed: patient identified, IV checked, site marked, risks and benefits discussed, surgical consent, monitors and equipment checked, pre-op evaluation and timeout performed  Epidural Patient position: sitting Prep: ChloraPrep Patient monitoring: heart rate, cardiac monitor, continuous pulse ox and blood pressure Approach: midline Injection technique: LOR air  Needle:  Needle type: Tuohy  Needle gauge: 17 G Needle length: 9 cm Needle insertion depth: 8 cm Catheter type: closed end flexible Catheter size: 20 Guage Catheter at skin depth: 12 cm Test dose: negative  Assessment Events: blood not aspirated, injection not painful, no injection resistance, no paresthesia and negative IV test  Additional Notes Reason for block:procedure for pain

## 2019-06-09 NOTE — Progress Notes (Signed)
Delivery Note At 1:24 PM a viable female was delivered via  (Presentation:      ).  APGAR: , ; weight  .   Placenta status: intact , to path  .  Cord:   3 vessels with the following complications: loose nuchal cord x 2 .  Cord pH: pending  Anesthesia:   Episiotomy:   Lacerations: small second degrees Lac @ 3:00 of introitus repaired  Suture Repair: 2.0 vicryl rapide Est. Blood Loss (mL):    Mom to postpartum.  Baby to Couplet care / Skin to Skin.  Roselle Locus II 06/09/2019, 1:35 PM

## 2019-06-10 LAB — CBC
HCT: 32.6 % — ABNORMAL LOW (ref 36.0–46.0)
HCT: 31.6 % — ABNORMAL LOW (ref 36.0–46.0)
Hemoglobin: 10.3 g/dL — ABNORMAL LOW (ref 12.0–15.0)
Hemoglobin: 9.9 g/dL — ABNORMAL LOW (ref 12.0–15.0)
MCH: 28.1 pg (ref 26.0–34.0)
MCH: 27.7 pg (ref 26.0–34.0)
MCHC: 31.6 g/dL (ref 30.0–36.0)
MCHC: 31.3 g/dL (ref 30.0–36.0)
MCV: 88.8 fL (ref 80.0–100.0)
MCV: 88.5 fL (ref 80.0–100.0)
Platelets: 173 10*3/uL (ref 150–400)
Platelets: 179 10*3/uL (ref 150–400)
RBC: 3.67 MIL/uL — ABNORMAL LOW (ref 3.87–5.11)
RBC: 3.57 MIL/uL — ABNORMAL LOW (ref 3.87–5.11)
RDW: 15 % (ref 11.5–15.5)
RDW: 15.3 % (ref 11.5–15.5)
WBC: 9.4 10*3/uL (ref 4.0–10.5)
WBC: 8.3 10*3/uL (ref 4.0–10.5)
nRBC: 0 % (ref 0.0–0.2)
nRBC: 0 % (ref 0.0–0.2)

## 2019-06-10 LAB — COMPREHENSIVE METABOLIC PANEL
ALT: 126 U/L — ABNORMAL HIGH (ref 0–44)
ALT: 122 U/L — ABNORMAL HIGH (ref 0–44)
AST: 157 U/L — ABNORMAL HIGH (ref 15–41)
AST: 136 U/L — ABNORMAL HIGH (ref 15–41)
Albumin: 2.3 g/dL — ABNORMAL LOW (ref 3.5–5.0)
Albumin: 2.1 g/dL — ABNORMAL LOW (ref 3.5–5.0)
Alkaline Phosphatase: 99 U/L (ref 38–126)
Alkaline Phosphatase: 95 U/L (ref 38–126)
Anion gap: 10 (ref 5–15)
Anion gap: 9 (ref 5–15)
BUN: <5 mg/dL — ABNORMAL LOW (ref 6–20)
BUN: <5 mg/dL — ABNORMAL LOW (ref 6–20)
CO2: 22 mmol/L (ref 22–32)
CO2: 23 mmol/L (ref 22–32)
Calcium: 8.2 mg/dL — ABNORMAL LOW (ref 8.9–10.3)
Calcium: 8.1 mg/dL — ABNORMAL LOW (ref 8.9–10.3)
Chloride: 103 mmol/L (ref 98–111)
Chloride: 104 mmol/L (ref 98–111)
Creatinine, Ser: 0.65 mg/dL (ref 0.44–1.00)
Creatinine, Ser: 0.68 mg/dL (ref 0.44–1.00)
GFR calc Af Amer: >60 mL/min (ref >60)
GFR calc Af Amer: >60 mL/min (ref >60)
GFR calc non Af Amer: >60 mL/min (ref >60)
GFR calc non Af Amer: >60 mL/min (ref >60)
Glucose, Bld: 99 mg/dL (ref 70–99)
Glucose, Bld: 91 mg/dL (ref 70–99)
Potassium: 4.2 mmol/L (ref 3.5–5.1)
Potassium: 4.1 mmol/L (ref 3.5–5.1)
Sodium: 135 mmol/L (ref 135–145)
Sodium: 136 mmol/L (ref 135–145)
Total Bilirubin: 0.7 mg/dL (ref 0.3–1.2)
Total Bilirubin: 0.9 mg/dL (ref 0.3–1.2)
Total Protein: 6 g/dL — ABNORMAL LOW (ref 6.5–8.1)
Total Protein: 5.9 g/dL — ABNORMAL LOW (ref 6.5–8.1)

## 2019-06-10 LAB — GLUCOSE, CAPILLARY: Glucose-Capillary: 75 mg/dL (ref 70–99)

## 2019-06-10 LAB — MAGNESIUM: Magnesium: 4 mg/dL — ABNORMAL HIGH (ref 1.7–2.4)

## 2019-06-10 MED ORDER — GENERIC EXTERNAL MEDICATION
Status: DC
Start: ? — End: 2019-06-10

## 2019-06-10 NOTE — Anesthesia Postprocedure Evaluation (Signed)
Anesthesia Post Note  Patient: Kelli Mcpherson  Procedure(s) Performed: AN AD HOC LABOR EPIDURAL     Patient location during evaluation: Mother Baby Anesthesia Type: Epidural Level of consciousness: awake and alert Pain management: pain level controlled Vital Signs Assessment: post-procedure vital signs reviewed and stable Respiratory status: spontaneous breathing Cardiovascular status: stable Postop Assessment: no headache, no backache, epidural receding, patient able to bend at knees, no apparent nausea or vomiting, able to ambulate and adequate PO intake Anesthetic complications: no    Last Vitals:  Vitals:   06/10/19 0502 06/10/19 0752  BP: (!) 123/59 (!) 144/76  Pulse: 92 82  Resp: 18   Temp: 36.9 C 36.8 C  SpO2: 96% 98%    Last Pain:  Vitals:   06/10/19 0752  TempSrc: Oral  PainSc:    Pain Goal: Patients Stated Pain Goal: 0 (06/07/19 9629)              Epidural/Spinal Function Cutaneous sensation: Normal sensation (06/10/19 0739), Patient able to flex knees: Yes (06/10/19 0739), Patient able to lift hips off bed: Yes (06/10/19 0739), Back pain beyond tenderness at insertion site: No (06/10/19 0739), Progressively worsening motor and/or sensory loss: No (06/10/19 0739), Bowel and/or bladder incontinence post epidural: No (06/10/19 0739)  Laban Emperor

## 2019-06-10 NOTE — Lactation Note (Signed)
This note was copied from a baby's chart. Lactation Consultation Note  Patient Name: Boy Keryl Gholson [id number] Date: 06/10/2019  Attempted to see mom in her room.  Mom is in rest room.  Went back to try to see again.  Still in rest room.  Dad reports will let her know I came by.   Maternal Data    Feeding Feeding Type: Donor Breast Milk  LATCH Score                   Interventions    Lactation Tools Discussed/Used     Consult Status      Caydin Yeatts Michaelle Copas 06/10/2019, 6:49 PM

## 2019-06-10 NOTE — Lactation Note (Signed)
This note was copied from a baby's chart. Lactation Consultation Note  Patient Name: Kelli Mcpherson [id number] Date: 06/10/2019  Attempted to see mom again.  She has gone too NICU.  RN reports this is the first time she has gone to NICU today.  Urged RN to let her know I came by.    Maternal Data    Feeding Feeding Type: Donor Breast Milk  LATCH Score                   Interventions    Lactation Tools Discussed/Used     Consult Status      Kelli Mcpherson 06/10/2019, 6:52 PM

## 2019-06-10 NOTE — Clinical Social Work Maternal (Addendum)
CLINICAL SOCIAL WORK MATERNAL/CHILD NOTE  Patient Details  Name: Kelli Mcpherson MRN: [id number] Date of Birth: Oct 03, 1995  Date:  06/10/2019  Clinical Social Worker Initiating Note:  Abundio Miu, [place] Date/Time: Initiated:  06/10/19/1403     Child's Name:  Reita Cliche   Biological Parents:  Mother, Father(Father: Karl Ito)   Need for Interpreter:  None   Reason for Referral:  NICU Admission   Address:  [place] [address]    Phone number:  [phone] (home)     Additional phone number: Criss Rosales (mother) [phone]  Household Members/Support Persons (HM/SP):       HM/SP Name Relationship DOB or Age  HM/SP -1    mother    HM/SP -2    father    HM/SP -3    brother    HM/SP -4        HM/SP -5        HM/SP -6        HM/SP -7        HM/SP -8          Natural Supports (not living in the home):  Immediate Family, Extended Family   Professional Supports: None   Employment: Unemployed   Type of Work:     Education:  Programmer, systems   Homebound arranged:    Museum/gallery curator Resources:  Multimedia programmer   Other Resources:  [employer], Physicist, medical    Cultural/Religious Considerations Which May Impact Care:    Strengths:  Ability to meet basic needs , Home prepared for child , Understanding of illness   Psychotropic Medications:         Pediatrician:     Not selected yet, provided with a pediatrician list  Pediatrician List:   [place]      Pediatrician Fax Number:    Risk Factors/Current Problems:  None   Cognitive State:  Able to Concentrate , Alert , Goal Oriented , Insightful , Linear Thinking    Mood/Affect:  Calm , Interested , Happy , Comfortable , Agitated    CSW Assessment: CSW met with MOB at bedside to discuss infant's NICU admission, FOB present. CSW introduced self and explained reason for visit.  MOB was welcoming, pleasant and engaged during visit. MOB reported that she resides with her parents and brother, noting FOB plans to move in to assist with infant. MOB reported that she is unemployed and receives both [facility] and food stamps. MOB reported that she plans to go to school and study phlebotomy. CSW positively affirmed MOB's plans for school. MOB reported that she has all items needed to care for infant including a car seat, basinet and crib. CSW inquired about MOB's support system, MOB reported that her whole family is very supportive.   CSW and MOB discussed infant's NICU admission. CSW informed MOB about the NICU, what to expect and resources/support available while infant is admitted to the NICU. MOB reported that she feels well informed about infant's care, noting she just got off the phone with infant's nurse. MOB reported that infant's nurse today is "real nice". MOB shared that when infant first went to the NICU she was crying because infant was taken away from her. CSW acknowledged and validated MOB's feelings surrounding being separated for infant. CSW reassured MOB that infant is getting all needed care while being in  the NICU. MOB denied any transportation barriers with visiting infant in the NICU, noting she has her own car and leaves close by. MOB denied any questions or concerns regarding the NICU. MOB reported that she is interested in seeing lactation, CSW agreed to notify MOB's nurse.   CSW asked FOB to leave the room to speak with MOB privately, FOB left the room. CSW inquired about MOB's mental health history, MOB denied any mental health history. CSW inquired about how MOB was feeling emotionally after giving birth, MOB reported that she was feeling excited and ready to have infant with her. MOB presented calm and did not demonstrate any acute mental health signs/symptoms. CSW assessed for safety, MOB denied SI, HI and domestic violence.   CSW provided education regarding the baby  blues period vs. perinatal mood disorders, discussed treatment and gave resources for mental health follow up if concerns arise.  CSW recommends self-evaluation during the postpartum time period using the New Mom Checklist from Postpartum Progress and encouraged MOB to contact a medical professional if symptoms are noted at any time. MOB reported that she does not have postpartum depression. CSW acknowledged MOB not having postpartum depression and explained to MOB that symptoms may present later and CSW wants MOB to be aware of symptoms so she can assess herself regularly.   CSW provided review of Sudden Infant Death Syndrome (SIDS) precautions.  MOB verbalized understanding and reported that she spoke to her mother about safe sleeping.   CSW informed MOB about the hospital drug screen policy due to "Questionable exposure to illicit drugs.  Mom with h/o of past crack cocaine use, unclear as to when she last used.  Mom's urine drug screen was negative on 5/9 when she was admitted due to diabetic ketoacidosis." per infant's H&P. MOB reported that she is upset by this as she has never used drugs. MOB reported that the substance use documented in her chart is inaccurate and she wants it removed from her chart. MOB reported that she has never used crack or cocaine. CSW acknowledged MOB's feelings and provided MOB with the number to patient experience to voice her concerns. MOB reemphasized that she has never used drugs and has only used alcohol in the past prior to pregnancy because she is of age to drink. CSW informed MOB that infant's UDS was negative, MOB reported that she knows because she has never used drugs. CSW encouraged MOB to follow up with patient experience to voice her concerns and learn more about next steps to have substance use removed from her chart. MOB reported that she is not upset with CSW just the situation. CSW acknowledged and validated MOB's feelings. MOB became irritable when speaking  about hospital drug screen policy and sharing no history of substance use. CSW agreed to follow up with MOB weekly to provide support, MOB agreeable.   CSW updated RN that MOB is interested in seeing lactation.   CSW will continue to offer resources/supports while infant is admitted to the NICU.   CSW Plan/Description:  Sudden Infant Death Syndrome (SIDS) Education, Perinatal Mood and Anxiety Disorder (PMADs) Education, [place], Other Patient/Family Education    Burnis Medin, [place] 06/10/2019, 2:13 PM

## 2019-06-10 NOTE — Progress Notes (Signed)
PPD # 1   Patient is doing great!!  BP (!) 144/76 (BP Location: Right Arm)   Pulse 82   Temp 98.3 F (36.8 C) (Oral)   Resp 18   Ht 6' (1.829 m)   Wt 124 kg   LMP 07/31/2018 (Approximate) Comment: pt shielded  SpO2 98%   Breastfeeding Unknown   BMI 37.08 kg/m  Results for orders placed or performed during the hospital encounter of 05/29/19 (from the past 24 hour(s))  Glucose, capillary     Status: Abnormal   Collection Time: 06/09/19  9:10 AM  Result Value Ref Range   Glucose-Capillary 110 (H) 70 - 99 mg/dL  CBC     Status: Abnormal   Collection Time: 06/09/19 12:05 PM  Result Value Ref Range   WBC 8.0 4.0 - 10.5 K/uL   RBC 3.15 (L) 3.87 - 5.11 MIL/uL   Hemoglobin 8.9 (L) 12.0 - 15.0 g/dL   HCT 83.3 (L) 82.5 - 05.3 %   MCV 93.3 80.0 - 100.0 fL   MCH 28.3 26.0 - 34.0 pg   MCHC 30.3 30.0 - 36.0 g/dL   RDW 97.6 73.4 - 19.3 %   Platelets PLATELET CLUMPS NOTED ON SMEAR, UNABLE TO ESTIMATE 150 - 400 K/uL   nRBC 0.0 0.0 - 0.2 %  CBC     Status: Abnormal   Collection Time: 06/09/19  3:05 PM  Result Value Ref Range   WBC 11.9 (H) 4.0 - 10.5 K/uL   RBC 3.67 (L) 3.87 - 5.11 MIL/uL   Hemoglobin 10.2 (L) 12.0 - 15.0 g/dL   HCT 79.0 (L) 24.0 - 97.3 %   MCV 89.4 80.0 - 100.0 fL   MCH 27.8 26.0 - 34.0 pg   MCHC 31.1 30.0 - 36.0 g/dL   RDW 53.2 99.2 - 42.6 %   Platelets 155 150 - 400 K/uL   nRBC 0.0 0.0 - 0.2 %  Comprehensive metabolic panel     Status: Abnormal   Collection Time: 06/09/19  3:05 PM  Result Value Ref Range   Sodium 137 135 - 145 mmol/L   Potassium 4.4 3.5 - 5.1 mmol/L   Chloride 103 98 - 111 mmol/L   CO2 23 22 - 32 mmol/L   Glucose, Bld 104 (H) 70 - 99 mg/dL   BUN <5 (L) 6 - 20 mg/dL   Creatinine, Ser 8.34 0.44 - 1.00 mg/dL   Calcium 8.6 (L) 8.9 - 10.3 mg/dL   Total Protein 6.0 (L) 6.5 - 8.1 g/dL   Albumin 2.3 (L) 3.5 - 5.0 g/dL   AST 196 (H) 15 - 41 U/L   ALT 119 (H) 0 - 44 U/L   Alkaline Phosphatase 96 38 - 126 U/L   Total Bilirubin 0.6 0.3 - 1.2 mg/dL    GFR calc non Af Amer >60 >60 mL/min   GFR calc Af Amer >60 >60 mL/min   Anion gap 11 5 - 15  Glucose, capillary     Status: None   Collection Time: 06/09/19  3:43 PM  Result Value Ref Range   Glucose-Capillary 91 70 - 99 mg/dL  CBC     Status: Abnormal   Collection Time: 06/09/19 11:18 PM  Result Value Ref Range   WBC 9.4 4.0 - 10.5 K/uL   RBC 3.67 (L) 3.87 - 5.11 MIL/uL   Hemoglobin 10.3 (L) 12.0 - 15.0 g/dL   HCT 22.2 (L) 97.9 - 89.2 %   MCV 88.8 80.0 - 100.0 fL   MCH 28.1  26.0 - 34.0 pg   MCHC 31.6 30.0 - 36.0 g/dL   RDW 53.6 14.4 - 31.5 %   Platelets 173 150 - 400 K/uL   nRBC 0.0 0.0 - 0.2 %  Comprehensive metabolic panel     Status: Abnormal   Collection Time: 06/09/19 11:18 PM  Result Value Ref Range   Sodium 135 135 - 145 mmol/L   Potassium 4.2 3.5 - 5.1 mmol/L   Chloride 103 98 - 111 mmol/L   CO2 22 22 - 32 mmol/L   Glucose, Bld 99 70 - 99 mg/dL   BUN <5 (L) 6 - 20 mg/dL   Creatinine, Ser 4.00 0.44 - 1.00 mg/dL   Calcium 8.2 (L) 8.9 - 10.3 mg/dL   Total Protein 6.0 (L) 6.5 - 8.1 g/dL   Albumin 2.3 (L) 3.5 - 5.0 g/dL   AST 867 (H) 15 - 41 U/L   ALT 126 (H) 0 - 44 U/L   Alkaline Phosphatase 99 38 - 126 U/L   Total Bilirubin 0.7 0.3 - 1.2 mg/dL   GFR calc non Af Amer >60 >60 mL/min   GFR calc Af Amer >60 >60 mL/min   Anion gap 10 5 - 15  Magnesium     Status: Abnormal   Collection Time: 06/09/19 11:18 PM  Result Value Ref Range   Magnesium 4.0 (H) 1.7 - 2.4 mg/dL  Glucose, capillary     Status: None   Collection Time: 06/10/19  5:30 AM  Result Value Ref Range   Glucose-Capillary 75 70 - 99 mg/dL  CBC     Status: Abnormal   Collection Time: 06/10/19  5:56 AM  Result Value Ref Range   WBC 8.3 4.0 - 10.5 K/uL   RBC 3.57 (L) 3.87 - 5.11 MIL/uL   Hemoglobin 9.9 (L) 12.0 - 15.0 g/dL   HCT 61.9 (L) 50.9 - 32.6 %   MCV 88.5 80.0 - 100.0 fL   MCH 27.7 26.0 - 34.0 pg   MCHC 31.3 30.0 - 36.0 g/dL   RDW 71.2 45.8 - 09.9 %   Platelets 179 150 - 400 K/uL   nRBC  0.0 0.0 - 0.2 %  Comprehensive metabolic panel     Status: Abnormal   Collection Time: 06/10/19  5:56 AM  Result Value Ref Range   Sodium 136 135 - 145 mmol/L   Potassium 4.1 3.5 - 5.1 mmol/L   Chloride 104 98 - 111 mmol/L   CO2 23 22 - 32 mmol/L   Glucose, Bld 91 70 - 99 mg/dL   BUN <5 (L) 6 - 20 mg/dL   Creatinine, Ser 8.33 0.44 - 1.00 mg/dL   Calcium 8.1 (L) 8.9 - 10.3 mg/dL   Total Protein 5.9 (L) 6.5 - 8.1 g/dL   Albumin 2.1 (L) 3.5 - 5.0 g/dL   AST 825 (H) 15 - 41 U/L   ALT 122 (H) 0 - 44 U/L   Alkaline Phosphatase 95 38 - 126 U/L   Total Bilirubin 0.9 0.3 - 1.2 mg/dL   GFR calc non Af Amer >60 >60 mL/min   GFR calc Af Amer >60 >60 mL/min   Anion gap 9 5 - 15   Scheduled Meds: . ibuprofen  600 mg Oral Q6H  . prenatal multivitamin  1 tablet Oral Q1200  . senna-docusate  2 tablet Oral Q24H  . Tdap  0.5 mL Intramuscular Once   Continuous Infusions: . lactated ringers 75 mL/hr at 06/10/19 0113   PRN Meds:.acetaminophen, benzocaine-Menthol, coconut oil,  witch hazel-glycerin **AND** dibucaine, diphenhydrAMINE, ondansetron **OR** ondansetron (ZOFRAN) IV, oxyCODONE, oxyCODONE, simethicone, zolpidem  Abdomen is soft and non tender  IMPRESSION: PPD # 1  DM Preeclampsia  NSVD  PLAN: Patient doing well Discontinue Magnesium today  Recheck labs in am Consider discharge home tomorrow

## 2019-06-11 ENCOUNTER — Ambulatory Visit: Payer: Self-pay

## 2019-06-11 ENCOUNTER — Other Ambulatory Visit: Payer: Self-pay

## 2019-06-11 ENCOUNTER — Inpatient Hospital Stay (HOSPITAL_COMMUNITY)
Admission: AD | Admit: 2019-06-11 | Discharge: 2019-06-11 | Disposition: A | Discharge status: Home / Self Care | Payer: Managed Care, Other (non HMO) | Attending: Obstetrics and Gynecology | Admitting: Obstetrics and Gynecology

## 2019-06-11 DIAGNOSIS — Z79899 Other long term (current) drug therapy: Secondary | ICD-10-CM

## 2019-06-11 DIAGNOSIS — Z4589 Encounter for adjustment and management of other implanted devices: Secondary | ICD-10-CM

## 2019-06-11 LAB — CBC
HCT: 30.7 % — ABNORMAL LOW (ref 36.0–46.0)
Hemoglobin: 9.5 g/dL — ABNORMAL LOW (ref 12.0–15.0)
MCH: 28 pg (ref 26.0–34.0)
MCHC: 30.9 g/dL (ref 30.0–36.0)
MCV: 90.6 fL (ref 80.0–100.0)
Platelets: 182 10*3/uL (ref 150–400)
RBC: 3.39 MIL/uL — ABNORMAL LOW (ref 3.87–5.11)
RDW: 14.8 % (ref 11.5–15.5)
WBC: 7.6 10*3/uL (ref 4.0–10.5)
nRBC: 0 % (ref 0.0–0.2)

## 2019-06-11 LAB — COMPREHENSIVE METABOLIC PANEL
ALT: 104 U/L — ABNORMAL HIGH (ref 0–44)
AST: 88 U/L — ABNORMAL HIGH (ref 15–41)
Albumin: 2.3 g/dL — ABNORMAL LOW (ref 3.5–5.0)
Alkaline Phosphatase: 88 U/L (ref 38–126)
Anion gap: 11 (ref 5–15)
BUN: 7 mg/dL (ref 6–20)
CO2: 25 mmol/L (ref 22–32)
Calcium: 8.8 mg/dL — ABNORMAL LOW (ref 8.9–10.3)
Chloride: 102 mmol/L (ref 98–111)
Creatinine, Ser: 0.74 mg/dL (ref 0.44–1.00)
GFR calc Af Amer: >60 mL/min (ref >60)
GFR calc non Af Amer: >60 mL/min (ref >60)
Glucose, Bld: 87 mg/dL (ref 70–99)
Potassium: 4.4 mmol/L (ref 3.5–5.1)
Sodium: 138 mmol/L (ref 135–145)
Total Bilirubin: 0.7 mg/dL (ref 0.3–1.2)
Total Protein: 5.9 g/dL — ABNORMAL LOW (ref 6.5–8.1)

## 2019-06-11 MED ORDER — IBUPROFEN 600 MG PO TABS: 600.0000 mg | ORAL_TABLET | Freq: Four times a day (QID) | ORAL | 0 refills | Status: DC

## 2019-06-11 MED ORDER — OXYCODONE HCL 10 MG PO TABS: 10.0000 mg | ORAL_TABLET | ORAL | 0 refills | Status: DC | PRN

## 2019-06-11 NOTE — Lactation Note (Signed)
This note was copied from a baby's chart. Lactation Consultation Note  Patient Name: Kelli Mcpherson [id number] Date: 06/11/2019 Reason for consult: Follow-up assessment;Mother's request;Primapara;1st time breastfeeding;NICU baby;Preterm <34wks   1200-1230 - I followed up with Kelli Mcpherson as requested. She asked for assistance with hand expression and pumping. I reviewed hand expression. She was able to repeat back and see her colostrum. She is in lactogenesis one at this time. We discussed when to expect mature milk to transition.  I then helped her pump using her symphony DEBP. I reviewed pump settings. She uses coconut oil on her breasts prior to pumping. I indicated that she should switch pump setting to expression phase when she begins to pump 20 mls consistently.  While I observed her pump, I noted that flanges appear snug. I provided her with a size 30 flange.  Kelli Mcpherson has been discharged today. She plans to go home tonight. She states taht someone purchased a pump for her. She has WIC, and she has an appointment next week to pick up her DEBP.  I reviewed our [facility] loaner program and provided her with the paperwork. I offered to provide her with a loaner pump today before she goes home with $30 cash deposit. She did not provide clear indication that she was interested in this option, but she did verbalize understanding.  Kelli Mcpherson was feeling very encouraged about baby Zachariah's progress. Her goal is to breast feed him, and she would like lactation to help her as soon as he is ready to breast feed. She is interested in follow up tomorrow.    Feeding Feeding Type: Donor Breast Milk   Interventions Interventions: Breast feeding basics reviewed;Hand express;Breast massage;DEBP;Coconut oil  Lactation Tools Discussed/Used WIC Program: Yes Pump Review: Setup, frequency, and cleaning;Milk Storage   Consult Status Consult Status: Follow-up Date: 06/12/19 Follow-up type:  In-patient    Walker Shadow 06/11/2019, 12:58 PM

## 2019-06-11 NOTE — Lactation Note (Signed)
This note was copied from a baby's chart. Lactation Consultation Note  Patient Name: Kelli Mcpherson XIPJA'S Date: 06/11/2019 Reason for consult: Follow-up assessment;1st time breastfeeding;Preterm <34wks P1, 34 hour preterm female infant in NICU. Mom with hx: pre-eclampsia, GDM uncontrolled, HSV, positive for COVID 4/27/2. Tools given : Mom given DEBP due infant separation. Mom was given 27 mm flange which is a right fit for her breast size, she has coconut oil for breast soreness and understands how to use coconut oil. LC explained if mom is feel pain when using DEBP to decrease the amount of suction to lower level that is comfortable.  LC entered room, mom was finishing dinner and RN assisting mom with meds. Per mom, she only pumped twice yesterday but plans to start pumping every 3 hours for 15 minutes as advised by previous LC. LC reviewed hand expression and mom taught back, mom expressed 3 mls of colostrum that was placed in bullet and dad taking to NICU for infant. Mom understands the importance of using DEBP to establish her milk supply and to hand express after using DEBP. Mom knows to call RN or LC if she has any questions or concerns.  Maternal Data    Feeding Feeding Type: Donor Breast Milk  LATCH Score                   Interventions Interventions: Hand express;Expressed milk;DEBP  Lactation Tools Discussed/Used Tools: Pump;Coconut oil Breast pump type: Double-Electric Breast Pump   Consult Status Consult Status: Follow-up Date: 06/11/19 Follow-up type: In-patient    Danelle Earthly 06/11/2019, 12:07 AM

## 2019-06-11 NOTE — Discharge Summary (Signed)
   Postpartum Discharge Summary  Date of Service May 21/2021     Patient Name: Kelli Mcpherson DOB: 08/13/1995 MRN: [id number]  Date of admission: 05/29/2019 Delivery date:06/09/2019  Delivering provider: TOMBLIN, JAMES  Date of discharge: 06/11/2019  Admitting diagnosis: COVID-19 virus infection [U07.1] Pre-eclampsia [O14.90] Intrauterine pregnancy: [redacted]w[redacted]d     Secondary diagnosis:  Principal Problem:   COVID-19 affecting pregnancy in third trimester Active Problems:   Obesity   DKA (diabetic ketoacidosis) (HCC)   Pre-eclampsia  Additional problems: none    Discharge diagnosis: Preterm Pregnancy Delivered, Preeclampsia (severe), CHTN and GDM A2                                              Post partum procedures:none Augmentation: AROM, Pitocin and IP Foley Complications: None  Hospital course: Induction of Labor With Vaginal Delivery   24 y.o. yo G1P0101 at [redacted]w[redacted]d was admitted to the hospital 05/29/2019 for induction of labor.  Indication for induction: Preeclampsia and TYPE 2 DM.  Patient had an uncomplicated labor course as follows: Membrane Rupture Time/Date: 9:04 AM ,06/09/2019   Delivery Method:Vaginal, Spontaneous  Episiotomy: None  Lacerations:  1st degree  Details of delivery can be found in separate delivery note.  Patient had a routine postpartum course. Patient is discharged home 06/11/19.  Newborn Data: Birth date:06/09/2019  Birth time:1:24 PM  Gender:Female  Living status:Living  Apgars:9 ,9  Weight:2530 g   Magnesium Sulfate received: Yes: Seizure prophylaxis BMZ received: Yes Rhophylac:No MMR:No T-DaP:Given prenatally Flu: No Transfusion:No  Physical exam  Vitals:   06/10/19 1559 06/10/19 2110 06/10/19 2345 06/11/19 0530  BP: (!) 142/88 (!) 131/57 (!) 143/77 (!) 139/58  Pulse: (!) 101 94 98 98  Resp: 18 (!) 22 19 18  Temp: 97.7 F (36.5 C) 98.2 F (36.8 C)  98.7 F (37.1 C)  TempSrc: Oral Oral  Oral  SpO2: 99% 99% 99% 99%  Weight:       Height:       General: alert Lochia: appropriate Uterine Fundus: firm Incision: Healing well with no significant drainage DVT Evaluation: No evidence of DVT seen on physical exam. Labs: Lab Results  Component Value Date   WBC 8.3 06/10/2019   HGB 9.9 (L) 06/10/2019   HCT 31.6 (L) 06/10/2019   MCV 88.5 06/10/2019   PLT 179 06/10/2019   CMP Latest Ref Rng & Units 06/10/2019  Glucose 70 - 99 mg/dL 91  BUN 6 - 20 mg/dL <5(L)  Creatinine 0.44 - 1.00 mg/dL 0.68  Sodium 135 - 145 mmol/L 136  Potassium 3.5 - 5.1 mmol/L 4.1  Chloride 98 - 111 mmol/L 104  CO2 22 - 32 mmol/L 23  Calcium 8.9 - 10.3 mg/dL 8.1(L)  Total Protein 6.5 - 8.1 g/dL 5.9(L)  Total Bilirubin 0.3 - 1.2 mg/dL 0.9  Alkaline Phos 38 - 126 U/L 95  AST 15 - 41 U/L 136(H)  ALT 0 - 44 U/L 122(H)   Edinburgh Score: Edinburgh Postnatal Depression Scale Screening Tool 06/11/2019  I have been able to laugh and see the funny side of things. 0  I have looked forward with enjoyment to things. 0  I have blamed myself unnecessarily when things went wrong. 0  I have been anxious or worried for no good reason. 0  I have felt scared or panicky for no good reason. 0  Things have   been getting on top of me. 0  I have been so unhappy that I have had difficulty sleeping. 0  I have felt sad or miserable. 0  I have been so unhappy that I have been crying. 0  The thought of harming myself has occurred to me. 0  Edinburgh Postnatal Depression Scale Total 0      After visit meds:  Allergies as of 06/11/2019   No Known Allergies     Medication List    STOP taking these medications   metFORMIN 750 MG 24 hr tablet Commonly known as: GLUCOPHAGE-XR   ondansetron 8 MG disintegrating tablet Commonly known as: ZOFRAN-ODT   promethazine 25 MG tablet Commonly known as: PHENERGAN   triamcinolone cream 0.1 % Commonly known as: KENALOG     TAKE these medications   ibuprofen 600 MG tablet Commonly known as: ADVIL Take 1 tablet  (600 mg total) by mouth every 6 (six) hours.   Oxycodone HCl 10 MG Tabs Take 1 tablet (10 mg total) by mouth every 4 (four) hours as needed (pain scale > 7).   Vitafol Gummies 3.33-0.333-34.8 MG Chew Chew 3 each by mouth daily.        Discharge home in stable condition Infant Feeding: Breast Infant Disposition:NICU Discharge instruction: per After Visit Summary and Postpartum booklet. Activity: Advance as tolerated. Pelvic rest for 6 weeks.  Diet: carb modified diet Anticipated Birth Control: Unsure Postpartum Appointment:1 week Additional Postpartum F/U: BP check 1 week Future Appointments:No future appointments. Follow up Visit:      06/11/2019 Cyril Mourning, MD

## 2019-06-11 NOTE — Discharge Instructions (Signed)

## 2019-06-11 NOTE — MAU Provider Note (Signed)
None     S [name] is a 24 y.o. G1P0101 at 2 days PP from Preterm SVD who presents to MAU today with complaint of diabetic monitor removal.   Patient reports she was discharged today for SVD and noticed her monitor was not removed. Patient denies other concerns.   O BP (!) 143/64 (BP Location: Right Arm)   Pulse (!) 107   Temp 98.7 F (37.1 C) (Oral)   Resp 18   LMP 07/31/2018 (Approximate) Comment: pt shielded  SpO2 98%  Physical Exam  Vitals reviewed. Constitutional: She is oriented to person, place, and time. She appears well-developed and well-nourished. No distress.  HENT:  Head: Normocephalic and atraumatic.  Eyes: Conjunctivae are normal.  Cardiovascular: Normal rate.  Respiratory: Effort normal.  Musculoskeletal:        General: Edema present. Normal range of motion.     Cervical back: Normal range of motion.  Neurological: She is alert and oriented to person, place, and time.  Skin: Skin is warm and dry.  Diabetic monitor removed from left upper arm without incident.   Psychiatric: She has a normal mood and affect. Her behavior is normal.    A Postpartum State Medical screening exam complete Medication Therapy Ended  P -Dr. Suzanne Boron contacted and confirms monitor was supposed to be removed. -Provider to bedside and removed monitor without incident. -Patient denies other concerns. -Discharge from MAU in stable condition  Gerrit Heck, CNM 06/11/2019 11:13 PM

## 2019-06-11 NOTE — MAU Note (Signed)
Pt reports to MAU wanting to have her diabetic monitor located in her left upper arm removed. She states they did not remove it when she was discharged home today.

## 2019-06-13 ENCOUNTER — Ambulatory Visit: Payer: Self-pay

## 2019-06-13 ENCOUNTER — Inpatient Hospital Stay (HOSPITAL_COMMUNITY)
Admission: AD | Admit: 2019-06-13 | Discharge: 2019-06-13 | Disposition: A | Discharge status: Home / Self Care | Payer: Managed Care, Other (non HMO) | Attending: Obstetrics and Gynecology | Admitting: Obstetrics and Gynecology

## 2019-06-13 ENCOUNTER — Other Ambulatory Visit: Payer: Self-pay

## 2019-06-13 ENCOUNTER — Encounter (HOSPITAL_COMMUNITY): Payer: Self-pay | Admitting: Obstetrics and Gynecology

## 2019-06-13 DIAGNOSIS — Z8249 Family history of ischemic heart disease and other diseases of the circulatory system: Secondary | ICD-10-CM | POA: Insufficient documentation

## 2019-06-13 DIAGNOSIS — R2243 Localized swelling, mass and lump, lower limb, bilateral: Secondary | ICD-10-CM

## 2019-06-13 DIAGNOSIS — Z87891 Personal history of nicotine dependence: Secondary | ICD-10-CM | POA: Insufficient documentation

## 2019-06-13 DIAGNOSIS — O99893 Other specified diseases and conditions complicating puerperium: Secondary | ICD-10-CM | POA: Insufficient documentation

## 2019-06-13 DIAGNOSIS — O165 Unspecified maternal hypertension, complicating the puerperium: Secondary | ICD-10-CM

## 2019-06-13 DIAGNOSIS — Z8616 Personal history of COVID-19: Secondary | ICD-10-CM | POA: Diagnosis not present

## 2019-06-13 DIAGNOSIS — O135 Gestational [pregnancy-induced] hypertension without significant proteinuria, complicating the puerperium: Secondary | ICD-10-CM | POA: Diagnosis not present

## 2019-06-13 DIAGNOSIS — M7989 Other specified soft tissue disorders: Secondary | ICD-10-CM | POA: Diagnosis not present

## 2019-06-13 HISTORY — DX: Gestational (pregnancy-induced) hypertension without significant proteinuria, unspecified trimester: O13.9

## 2019-06-13 LAB — COMPREHENSIVE METABOLIC PANEL
ALT: 83 U/L — ABNORMAL HIGH (ref 0–44)
AST: 60 U/L — ABNORMAL HIGH (ref 15–41)
Albumin: 2.8 g/dL — ABNORMAL LOW (ref 3.5–5.0)
Alkaline Phosphatase: 85 U/L (ref 38–126)
Anion gap: 11 (ref 5–15)
BUN: 5 mg/dL — ABNORMAL LOW (ref 6–20)
CO2: 20 mmol/L — ABNORMAL LOW (ref 22–32)
Calcium: 8.6 mg/dL — ABNORMAL LOW (ref 8.9–10.3)
Chloride: 107 mmol/L (ref 98–111)
Creatinine, Ser: 0.65 mg/dL (ref 0.44–1.00)
GFR calc Af Amer: >60 mL/min (ref >60)
GFR calc non Af Amer: >60 mL/min (ref >60)
Glucose, Bld: 72 mg/dL (ref 70–99)
Potassium: 3.9 mmol/L (ref 3.5–5.1)
Sodium: 138 mmol/L (ref 135–145)
Total Bilirubin: 0.4 mg/dL (ref 0.3–1.2)
Total Protein: 6.4 g/dL — ABNORMAL LOW (ref 6.5–8.1)

## 2019-06-13 LAB — PLATELET COUNT: Platelets: 179 10*3/uL (ref 150–400)

## 2019-06-13 LAB — SURGICAL PATHOLOGY

## 2019-06-13 MED ORDER — FUROSEMIDE 20 MG PO TABS
20.0000 mg | ORAL_TABLET | Freq: Two times a day (BID) | ORAL | 0 refills | Status: DC
Start: 2019-06-13 — End: 2019-06-20

## 2019-06-13 MED ORDER — ENALAPRIL MALEATE 5 MG PO TABS
5.0000 mg | ORAL_TABLET | Freq: Every day | ORAL | 0 refills | Status: DC
Start: 2019-06-13 — End: 2019-06-20

## 2019-06-13 NOTE — Lactation Note (Signed)
This note was copied from a baby's chart. Lactation Consultation Note  Patient Name: Boy Kandi Brusseau [id number] Date: 06/13/2019 Reason for consult: Follow-up assessment  LC Follow Up Visit:  RN called for a follow up visit; mother had returned from her lab work.  Baby was swaddled and sleeping in mother's lap when I arrived.  Mother was interested in obtaining a consult tomorrow at noon.  Appointment placed on schedule for tomorrow.    Discussed realistic expectations for a baby at this age.  Mother wants baby to "breast feed."  Informed her that we will review the LPTI policy with her tomorrow.  Mother has a DEBP for home use.  Father present.  RN updated.     Maternal Data    Feeding Feeding Type: Donor Breast Milk  LATCH Score                   Interventions    Lactation Tools Discussed/Used     Consult Status Consult Status: PRN Date: 06/13/19 Follow-up type: Call as needed    Irene Pap Zitlaly Malson 06/13/2019, 6:58 PM

## 2019-06-13 NOTE — Lactation Note (Signed)
This note was copied from a baby's chart. Lactation Consultation Note  Patient Name: [name] [id number] Date: 06/13/2019   NICU baby 34 weeks CGA. Mother excited because she pumping approx 15 ml with DEBP.  Mother states she has been pumping q 4 hours. She has [facility] loaner pump and has contacted her insurance to receive personal DEBP.   Mother has significant lower extremity pitting edema and will visit MD today. Would like LC to come back to assist w/latching nuzzle. Provided education regarding realistic expectations regarding preterm infant feeding.        Maternal Data    Feeding Feeding Type: Donor Breast Milk  LATCH Score                   Interventions    Lactation Tools Discussed/Used     Consult Status      Kelli Mcpherson 06/13/2019, 2:34 PM

## 2019-06-13 NOTE — Discharge Instructions (Signed)
Postpartum Hypertension Postpartum hypertension is high blood pressure that remains higher than normal after childbirth. You may not realize that you have postpartum hypertension if your blood pressure is not being checked regularly. In most cases, postpartum hypertension will go away on its own, usually within a week of delivery. However, for some women, medical treatment is required to prevent serious complications, such as seizures or stroke. What are the causes? This condition may be caused by one or more of the following:  Hypertension that existed before pregnancy (chronic hypertension).  Hypertension that comes on as a result of pregnancy (gestational hypertension).  Hypertensive disorders during pregnancy (preeclampsia) or seizures in women who have high blood pressure during pregnancy (eclampsia).  A condition in which the liver, platelets, and red blood cells are damaged during pregnancy (HELLP syndrome).  A condition in which the thyroid produces too much hormones (hyperthyroidism).  Other rare problems of the nerves (neurological disorders) or blood disorders. In some cases, the cause may not be known. What increases the risk? The following factors may make you more likely to develop this condition:  Chronic hypertension. In some cases, this may not have been diagnosed before pregnancy.  Obesity.  Type 2 diabetes.  Kidney disease.  History of preeclampsia or eclampsia.  Other medical conditions that change the level of hormones in the body (hormonal imbalance). What are the signs or symptoms? As with all types of hypertension, postpartum hypertension may not have any symptoms. Depending on how high your blood pressure is, you may experience:  Headaches. These may be mild, moderate, or severe. They may also be steady, constant, or sudden in onset (thunderclap headache).  Changes in your ability to see (visual changes).  Dizziness.  Shortness of breath.  Swelling  of your hands, feet, lower legs, or face. In some cases, you may have swelling in more than one of these locations.  Heart palpitations or a racing heartbeat.  Difficulty breathing while lying down.  Decrease in the amount of urine that you pass. Other rare signs and symptoms may include:  Sweating more than usual. This lasts longer than a few days after delivery.  Chest pain.  Sudden dizziness when you get up from sitting or lying down.  Seizures.  Nausea or vomiting.  Abdominal pain. How is this diagnosed? This condition may be diagnosed based on the results of a physical exam, blood pressure measurements, and blood and urine tests. You may also have other tests, such as a CT scan or an MRI, to check for other problems of postpartum hypertension. How is this treated? If blood pressure is high enough to require treatment, your options may include:  Medicines to reduce blood pressure (antihypertensives). Tell your health care provider if you are breastfeeding or if you plan to breastfeed. There are many antihypertensive medicines that are safe to take while breastfeeding.  Stopping medicines that may be causing hypertension.  Treating medical conditions that are causing hypertension.  Treating the complications of hypertension, such as seizures, stroke, or kidney problems. Your health care provider will also continue to monitor your blood pressure closely until it is within a safe range for you. Follow these instructions at home:  Take over-the-counter and prescription medicines only as told by your health care provider.  Return to your normal activities as told by your health care provider. Ask your health care provider what activities are safe for you.  Do not use any products that contain nicotine or tobacco, such as cigarettes and e-cigarettes. If   you need help quitting, ask your health care provider.  Keep all follow-up visits as told by your health care provider. This  is important. Contact a health care provider if:  Your symptoms get worse.  You have new symptoms, such as: ? A headache that does not get better. ? Dizziness. ? Visual changes. Get help right away if:  You suddenly develop swelling in your hands, ankles, or face.  You have sudden, rapid weight gain.  You develop difficulty breathing, chest pain, racing heartbeat, or heart palpitations.  You develop severe pain in your abdomen.  You have any symptoms of a stroke. "BE FAST" is an easy way to remember the main warning signs of a stroke: ? B - Balance. Signs are dizziness, sudden trouble walking, or loss of balance. ? E - Eyes. Signs are trouble seeing or a sudden change in vision. ? F - Face. Signs are sudden weakness or numbness of the face, or the face or eyelid drooping on one side. ? A - Arms. Signs are weakness or numbness in an arm. This happens suddenly and usually on one side of the body. ? S - Speech. Signs are sudden trouble speaking, slurred speech, or trouble understanding what people say. ? T - Time. Time to call emergency services. Write down what time symptoms started.  You have other signs of a stroke, such as: ? A sudden, severe headache with no known cause. ? Nausea or vomiting. ? Seizure. These symptoms may represent a serious problem that is an emergency. Do not wait to see if the symptoms will go away. Get medical help right away. Call your local emergency services (911 in the [place] Do not drive yourself to the hospital. Summary  Postpartum hypertension is high blood pressure that remains higher than normal after childbirth.  In most cases, postpartum hypertension will go away on its own, usually within a week of delivery.  For some women, medical treatment is required to prevent serious complications, such as seizures or stroke. This information is not intended to replace advice given to you by your health care provider. Make sure you discuss any questions  you have with your health care provider. Document Revised: 02/12/2018 Document Reviewed: 10/27/2016 Elsevier Patient Education  2020 Elsevier Inc.  

## 2019-06-13 NOTE — Lactation Note (Addendum)
This note was copied from a baby's chart. Lactation Consultation Note  Patient Name: Kelli Mcpherson [id number] Date: 06/13/2019 Reason for consult: Follow-up assessment  LC Follow Up Visit:  Mother had a lactation consult earlier today.  I was asked to return for another visit between 1730-1800 today, however, mother is not currently present.  She is getting some lab work drawn at this time.  RN will call me for follow up as desired when mother returns.   Maternal Data    Feeding Feeding Type: Donor Breast Milk  LATCH Score                   Interventions    Lactation Tools Discussed/Used     Consult Status Consult Status: PRN Date: 06/13/19 Follow-up type: Call as needed    Green Quincy R Josejulian Tarango 06/13/2019, 5:59 PM

## 2019-06-13 NOTE — MAU Note (Signed)
Presents with c/o swelling in bilateral lower extremities. Denies H/A, visual disturbances or epigastric pain.  S/P NSVD 5/20/202.  Reports had Pre Eclampsia Dx, delivered @ 33wks, baby in NICU.

## 2019-06-13 NOTE — MAU Provider Note (Signed)
Chief Complaint:  Swelling   First Provider Initiated Contact with Patient 06/13/19 1711     HPI: Kelli Mcpherson is a 24 y.o. G1P0101 at 4 days postpartum who presents to maternity admissions reporting lower extremity swelling.  She had a vaginal delivery at 33 weeks due to preeclampsia.  Denies any history of hypertension outside of pregnancy.  Was not discharged on antihypertensives.  Reports increase in swelling since being discharged from the hospital the other day.  EMS came to her house yesterday to check her blood pressure which she reports was 140s over 80s.  Denies headache, visual disturbance, or epigastric pain.  Denies any other symptoms or complaints.   Past Medical History:  Diagnosis Date  . Chlamydia   . COVID-19   . Eczema   . Gestational diabetes   . Gonorrhea   . Pregnancy induced hypertension    OB History  Gravida Para Term Preterm AB Living  1 1   1   1   SAB TAB Ectopic Multiple Live Births        0 1    # Outcome Date GA Lbr Len/2nd Weight Sex Delivery Anes PTL Lv  1 Preterm 06/09/19 [redacted]w[redacted]d  2530 g M Vag-Spont EPI  LIV   Past Surgical History:  Procedure Laterality Date  . LYMPHADENECTOMY    . WISDOM TOOTH EXTRACTION     Family History  Problem Relation Age of Onset  . Diabetes Mother   . Hypertension Mother   . Hypertension Father    Social History   Tobacco Use  . Smoking status: Former Smoker    Types: Cigars  . Smokeless tobacco: Former Systems developer  . Tobacco comment: patient currently does not smoke   Substance Use Topics  . Alcohol use: Not Currently    Comment: socially  . Drug use: Not Currently   No Known Allergies No medications prior to admission.    I have reviewed patient's Past Medical Hx, Surgical Hx, Family Hx, Social Hx, medications and allergies.   ROS:  Review of Systems  Constitutional: Negative.   Eyes: Negative for visual disturbance.  Respiratory: Negative.   Cardiovascular: Positive for leg swelling. Negative for  chest pain.  Gastrointestinal: Negative.   Neurological: Negative for headaches.    Physical Exam   Patient Vitals for the past 24 hrs:  BP Temp Temp src Pulse Resp SpO2 Height Weight  06/13/19 1907 -- 98.1 F (36.7 C) Oral -- -- -- -- --  06/13/19 1747 140/75 -- -- 76 -- -- -- --  06/13/19 1731 (!) 144/85 -- -- 85 -- 100 % -- --  06/13/19 1715 137/81 -- -- 88 -- 95 % -- --  06/13/19 1708 134/65 -- -- 79 -- -- -- --  06/13/19 1633 (!) 145/76 98.4 F (36.9 C) Oral 91 20 97 % 6' (1.829 m) 129.4 kg    Constitutional: Well-developed, well-nourished female in no acute distress.  Cardiovascular: normal rate & rhythm, no murmur Respiratory: normal effort, lung sounds clear throughout GI: Abd soft, non-tender, gravid appropriate for gestational age. Pos BS x 4 MS: Extremities nontender, 2+ pitting edema of BLE, normal ROM Neurologic: Alert and oriented x 4.    Labs: Results for orders placed or performed during the hospital encounter of 06/13/19 (from the past 24 hour(s))  Comprehensive metabolic panel     Status: Abnormal   Collection Time: 06/13/19  5:43 PM  Result Value Ref Range   Sodium 138 135 - 145 mmol/L   Potassium  3.9 3.5 - 5.1 mmol/L   Chloride 107 98 - 111 mmol/L   CO2 20 (L) 22 - 32 mmol/L   Glucose, Bld 72 70 - 99 mg/dL   BUN 5 (L) 6 - 20 mg/dL   Creatinine, Ser 1.61 0.44 - 1.00 mg/dL   Calcium 8.6 (L) 8.9 - 10.3 mg/dL   Total Protein 6.4 (L) 6.5 - 8.1 g/dL   Albumin 2.8 (L) 3.5 - 5.0 g/dL   AST 60 (H) 15 - 41 U/L   ALT 83 (H) 0 - 44 U/L   Alkaline Phosphatase 85 38 - 126 U/L   Total Bilirubin 0.4 0.3 - 1.2 mg/dL   GFR calc non Af Amer >60 >60 mL/min   GFR calc Af Amer >60 >60 mL/min   Anion gap 11 5 - 15  Platelet count     Status: None   Collection Time: 06/13/19  5:47 PM  Result Value Ref Range   Platelets 179 150 - 400 K/uL    Imaging:  No results found.  MAU Course: Orders Placed This Encounter  Procedures  . Comprehensive metabolic panel  .  Platelet count  . Discharge patient   Meds ordered this encounter  Medications  . furosemide (LASIX) 20 MG tablet    Sig: Take 1 tablet (20 mg total) by mouth 2 (two) times daily for 3 days.    Dispense:  6 tablet    Refill:  0    Order Specific Question:   Supervising Provider    Answer:   Conan Bowens [[id number]]  . enalapril (VASOTEC) 5 MG tablet    Sig: Take 1 tablet (5 mg total) by mouth daily.    Dispense:  30 tablet    Refill:  0    Order Specific Question:   Supervising Provider    Answer:   Conan Bowens [[id number]]    MDM: Elevated blood pressures.  None severe range and patient asymptomatic.  We will collect labs to compare to discharge labs.  Platelets stable and LFTs continue to improve.  No severe features at this time.  Patient received mag sulfate during labor and 24 hours postpartum.  Will discharge home with antihypertensive and a few days of diuretic therapy  Reviewed with Dr. Debroah Loop who is agreeable with plan for discharge Assessment: 1. Postpartum hypertension   2. Leg swelling     Plan: Discharge home in stable condition.  Postpartum preeclampsia precautions Prescription enalapril 5 mg daily Prescription for Lasix x3 days [facility] with [facility] for blood pressure check later this week Follow-up Information    [facility] Follow up.   Why: You need to schedule a blood pressure check later this week. You also need to schedule your 6 week postpartum visit Contact information: [address] [place] [address] [phone]           Allergies as of 06/13/2019   No Known Allergies     Medication List    TAKE these medications   acetaminophen 500 MG tablet Commonly known as: TYLENOL Take 500 mg by mouth every 6 (six) hours as needed.   enalapril 5 MG tablet Commonly known as: VASOTEC Take 1 tablet (5 mg total) by mouth daily.   furosemide 20 MG tablet Commonly known as: Lasix Take 1  tablet (20 mg total) by mouth 2 (two) times daily for 3 days.   ibuprofen 600 MG tablet Commonly known as: ADVIL Take 1 tablet (  600 mg total) by mouth every 6 (six) hours.   Oxycodone HCl 10 MG Tabs Take 1 tablet (10 mg total) by mouth every 4 (four) hours as needed (pain scale > 7).   Vitafol Gummies 3.33-0.333-34.8 MG Chew Chew 3 each by mouth daily.       Judeth Horn, NP 06/13/2019 8:03 PM

## 2019-06-14 ENCOUNTER — Ambulatory Visit: Payer: Self-pay

## 2019-06-14 NOTE — Lactation Note (Signed)
This note was copied from a baby's chart. Lactation Consultation Note  Patient Name: Kelli Mcpherson [id number] Date: 06/14/2019   [facility] came for 12 noon appt and Mom not present.  Baby's RN will call on vocera when Mom arrives.   [name] 06/14/2019, 12:11 PM

## 2019-06-15 ENCOUNTER — Ambulatory Visit: Payer: Self-pay

## 2019-06-15 NOTE — Lactation Note (Signed)
This note was copied from a baby's chart. Lactation Consultation Note  Patient Name: Boy Dhani Imel [id number] Date: 06/15/2019 Reason for consult: Follow-up assessment;NICU baby;Late-preterm 34-36.6wks  1200 - 1220 - I followed up with Ms. Gondek to assist with breast feeding. Baby Berneice Gandy is LPI, and he has not established a consistent latch. Ms. Hodges indicates that he is ready to breast feed. When I entered the room, Ms. Dinunzio was holding him in football hold on her left breast. He was sleeping at the breast.   I showed her how to hand express and allow him to lick and nuzzle the breast. He did so, and we expressed colostrum onto his mouth. Berneice Gandy never woke up enough to attempt a latch.  I reviewed LPI guidelines and provided her with a LPI handout. We discussed developmentental readiness based on GA for latching. I encouraged her to continue to provide him with opportunities to lick and learn until Berneice Gandy shows readiness to latch to the breast.  I tried to clarify some of the language/instructions given about feeding Berneice Gandy, and Ms. Witkop was able to verbalize understanding.  The plan I set forth is to: - Hold baby STS and provide opportunities to explore the breast - Express breast milk and allow baby to explore the breast and lick and learn - Pump every 2-3 hours (or 8 times a day) for 15 (approximately) minutes  Ms. Zipp requested more storage bottles and size 30 flanges. She has a [employer] DEBP at home, and she is using it there. She has a second DEBP kit there with size 30 flanges. I reviewed pump settings and found that she was still using the initiation phase. I indicated that she should switch to the expression phase now that milk is transitioning. Ms. Powless has seen an increase in milk production and is pleased. She was not pumping at night; I recommended that she set an alarm to pump at night and discussed rationale for consistent pumping and milk production.   Ms.  Gatt verbalized understanding and would appreciate lactation follow up. She suggested that an [place] see her tomorrow at the 9 am feeding (or noon if the 9 am feeding is not an option).  Maternal Data Has patient been taught Hand Expression?: Yes Does the patient have breastfeeding experience prior to this delivery?: No  Feeding Feeding Type: Donor Breast Milk  LATCH Score Latch: Too sleepy or reluctant, no latch achieved, no sucking elicited.  Audible Swallowing: None  Type of Nipple: Everted at rest and after stimulation  Comfort (Breast/Nipple): Soft / non-tender  Hold (Positioning): Assistance needed to correctly position infant at breast and maintain latch.  LATCH Score: 5  Interventions Interventions: Breast feeding basics reviewed;Skin to skin;Hand express;Breast compression;Adjust position;Support pillows  Lactation Tools Discussed/Used Tools: Flanges;Other (comment)(storage bottles; LPI guidelines) Flange Size: 30 Breast pump type: Double-Electric Breast Pump WIC Program: Yes Pump Review: Setup, frequency, and cleaning;Milk Storage   Consult Status Consult Status: Follow-up Date: 06/16/19 Follow-up type: In-patient    Lenore Manner 06/15/2019, 1:18 PM

## 2019-06-16 ENCOUNTER — Ambulatory Visit: Payer: Self-pay

## 2019-06-16 NOTE — Lactation Note (Signed)
This note was copied from a baby's chart. Lactation Consultation Note  Patient Name: Kelli Mcpherson Date: 06/16/2019 Reason for consult: Follow-up assessment;Mother's request;NICU baby;Late-preterm 49-36.6wks Mom requested appointment with lactation at 9 am.   LC in to assist Mom with positioning baby at the breast at 9 am.  Parents were giving baby a bath and then trying to get hand prints.  Mom aware that LC was there, providing pillows for Mom to use.  Mom states she pumped this am at 7:45 am and expressed about 2-3 oz.  Praised Mom and encouraged her to continue regular pumping.   At 9:25 am when baby was back in his crib, asked Mom if she was ready.  Mom stated that she remembered what to do and didn't need lactation help.   RN notified.  MInterventions Interventions: DEBP   Consult Status Consult Status: Follow-up Date: 06/23/19 Follow-up type: In-patient    [name] 06/16/2019, 9:29 AM

## 2019-06-18 ENCOUNTER — Ambulatory Visit: Payer: Self-pay

## 2019-06-18 NOTE — Lactation Note (Signed)
This note was copied from a baby's chart. Lactation Consultation Note  Patient Name: Kelli Mcpherson [id number] Date: 06/18/2019 Reason for consult: Follow-up assessment;Other (Comment)(request of RN) Received call from Terri, RN, states mom is currently pumping c/o nipples rubbing up against the flange, unsure if mom needs a larger size, currently using 34mm flanges bilat, advised apply coconut oil to inside of flange. This LC in to see mom, sized for 38mm vs 60mm, advised mom continue to with 59mm and apply coconut oil as needed, no blanching noted to skin, mom denies pain or discomfort. Encouraged to keep size 43mm flange for future use if needed. Advised how to ensure proper connection of pump parts when using DEBP, as left pump was with minimal suction. Mom voiced understanding and with no further concerns. Advised to call for [facility] support as needed. BGilliam, RN, IBCLC  Maternal Data    Feeding Feeding Type: Formula  LATCH Score                   Interventions    Lactation Tools Discussed/Used     Consult Status Consult Status: PRN    Charlynn Court 06/18/2019, 7:22 PM

## 2019-06-20 ENCOUNTER — Encounter (HOSPITAL_COMMUNITY): Payer: Self-pay | Admitting: Obstetrics and Gynecology

## 2019-06-20 ENCOUNTER — Other Ambulatory Visit: Payer: Self-pay

## 2019-06-20 ENCOUNTER — Inpatient Hospital Stay (HOSPITAL_COMMUNITY)
Admission: AD | Admit: 2019-06-20 | Discharge: 2019-06-20 | Disposition: A | Discharge status: Home / Self Care | Payer: Managed Care, Other (non HMO) | Attending: Obstetrics and Gynecology | Admitting: Obstetrics and Gynecology

## 2019-06-20 DIAGNOSIS — R030 Elevated blood-pressure reading, without diagnosis of hypertension: Secondary | ICD-10-CM | POA: Insufficient documentation

## 2019-06-20 DIAGNOSIS — O9089 Other complications of the puerperium, not elsewhere classified: Secondary | ICD-10-CM | POA: Insufficient documentation

## 2019-06-20 DIAGNOSIS — Z87891 Personal history of nicotine dependence: Secondary | ICD-10-CM | POA: Diagnosis not present

## 2019-06-20 DIAGNOSIS — Z8249 Family history of ischemic heart disease and other diseases of the circulatory system: Secondary | ICD-10-CM | POA: Diagnosis not present

## 2019-06-20 DIAGNOSIS — O99891 Other specified diseases and conditions complicating pregnancy: Secondary | ICD-10-CM | POA: Diagnosis not present

## 2019-06-20 DIAGNOSIS — R600 Localized edema: Secondary | ICD-10-CM | POA: Diagnosis not present

## 2019-06-20 DIAGNOSIS — O1400 Mild to moderate pre-eclampsia, unspecified trimester: Secondary | ICD-10-CM

## 2019-06-20 DIAGNOSIS — Z3A00 Weeks of gestation of pregnancy not specified: Secondary | ICD-10-CM

## 2019-06-20 DIAGNOSIS — Z8616 Personal history of COVID-19: Secondary | ICD-10-CM | POA: Insufficient documentation

## 2019-06-20 DIAGNOSIS — M545 Low back pain, unspecified: Secondary | ICD-10-CM

## 2019-06-20 DIAGNOSIS — Z79899 Other long term (current) drug therapy: Secondary | ICD-10-CM | POA: Insufficient documentation

## 2019-06-20 DIAGNOSIS — O1493 Unspecified pre-eclampsia, third trimester: Secondary | ICD-10-CM

## 2019-06-20 LAB — COMPREHENSIVE METABOLIC PANEL
ALT: 20 U/L (ref 0–44)
AST: 24 U/L (ref 15–41)
Albumin: 3 g/dL — ABNORMAL LOW (ref 3.5–5.0)
Alkaline Phosphatase: 82 U/L (ref 38–126)
Anion gap: 8 (ref 5–15)
BUN: 6 mg/dL (ref 6–20)
CO2: 24 mmol/L (ref 22–32)
Calcium: 9 mg/dL (ref 8.9–10.3)
Chloride: 107 mmol/L (ref 98–111)
Creatinine, Ser: 0.69 mg/dL (ref 0.44–1.00)
GFR calc Af Amer: >60 mL/min (ref >60)
GFR calc non Af Amer: >60 mL/min (ref >60)
Glucose, Bld: 77 mg/dL (ref 70–99)
Potassium: 4.6 mmol/L (ref 3.5–5.1)
Sodium: 139 mmol/L (ref 135–145)
Total Bilirubin: 0.4 mg/dL (ref 0.3–1.2)
Total Protein: 6.6 g/dL (ref 6.5–8.1)

## 2019-06-20 LAB — CBC
HCT: 34.7 % — ABNORMAL LOW (ref 36.0–46.0)
Hemoglobin: 10.7 g/dL — ABNORMAL LOW (ref 12.0–15.0)
MCH: 28.1 pg (ref 26.0–34.0)
MCHC: 30.8 g/dL (ref 30.0–36.0)
MCV: 91.1 fL (ref 80.0–100.0)
Platelets: 328 10*3/uL (ref 150–400)
RBC: 3.81 MIL/uL — ABNORMAL LOW (ref 3.87–5.11)
RDW: 14.6 % (ref 11.5–15.5)
WBC: 6.5 10*3/uL (ref 4.0–10.5)
nRBC: 0 % (ref 0.0–0.2)

## 2019-06-20 MED ORDER — FUROSEMIDE 20 MG PO TABS: 20.0000 mg | ORAL_TABLET | Freq: Every day | ORAL | 0 refills | Status: DC

## 2019-06-20 MED ORDER — ACETAMINOPHEN-CODEINE 300-30 MG PO TABS: 1.0000 | ORAL_TABLET | Freq: Four times a day (QID) | ORAL | 0 refills | Status: DC | PRN

## 2019-06-20 MED ORDER — ENALAPRIL MALEATE 10 MG PO TABS
10.0000 mg | ORAL_TABLET | Freq: Every day | ORAL | 0 refills | Status: DC
Start: 2019-06-20 — End: 2020-06-27

## 2019-06-20 NOTE — Discharge Instructions (Signed)
AREA FAMILY PRACTICE PHYSICIANS  [place] ([address]) . [facility] o [address] [place], [place] [address] o [phone] o Mon-Fri 8:30-12:30, 1:30-5:00 o Accepting Medicaid . [facility] o [address], [place], [place] [address] o [phone] o Mon-Fri 8:00-5:30 . [facility] o [address] [place], [place] [address] o [phone] o Mon, Tue, Thur, Fri 8:30-5:00, Wed 10:00-7:00 (closed 1-2pm) o Accepting Medicaid . [facility] o [address], [place], [place]  [address] o Phone - [phone]   Fax - [phone]  [place] ([address]) . [facility] o [address] [place], [place] [address] o [phone] o Mon-Fri 8:00-5:00 . [facility]  o [address] [place], [place] [address] o [phone] o Mon-Fri 8:30-5:30, Sat (Oct.-Mar.) 9:00-1:00 o Accepting Medicaid  [place] ([address]) . [facility] o [address], [place], [place] [address] o [phone] o Mon-Fri 8:00-5:00  [place] ([address]) . [facility] o [address], [place], [place] [address] o [phone] o Mon-Fri 8:00-5:00 . [facility] o [address], [place], [place] [address] o [phone] o Mon-Fri 8:00-5:00 . [facility] o [address] [place], [place] [address] o [phone] o Mon-Fri 8:00-5:00 . [facility] o [address] [place] [address] o [phone] o Mon-Fri 7:30-5:30  [place] ([address]) . [facility] o [address] [place], [place] [address] o [phone] o Mon-Thur 8:00-6:00 o Accepting Medicaid . [facility] o [address] [place], [place] [address] o [phone] o Mon-Thur 7:30-7:30, Fri 7:30-4:30 o Accepting Medicaid . [facility] o [address], [place], [place]  [address] o [phone]   Fax - [phone]  [place] ([address] & [address]) . [facility] o [address] [place], [place] [address] o [phone] o Mon-Fri 7:00-5:00 . [facility] o [address], [place], [place] [address] o [phone] o Mon-Fri 8:00-5:00 o Accepting Medicaid . [facility] o [address],  [place], [place] [address] o [phone] o Mon-Fri 8:00-5:00 o Accepting Medicaid  [place] ([address]) . [facility] [place] o [address] [place], [place] [address] o [phone] o Mon-Fri 8:00-5:00 . [facility] o [address], [place], [place] [address] o [phone] o Mon-Fri 8:00-5:00 o Accepting Medicaid . [facility] o [address], [place], [place] [address] o [phone] o Mon-Fri 8:00-5:30, Sat&Sun by appointment (phones open at 8:30) o Accepting Medicaid  [place] ([address]) . [facility] o [address] [place], [place] [address] o [phone] o Mon-Thur 8:00-7:00, Fri 8:00-5:00, Sat 8:00-12:00, Sun 9:00-12:00 o Accepting Medicaid . [facility] [place] o [address], [place], [place] [address] o [phone] o Mon-Thur 8:00-5:00 o Accepting Medicaid . [facility] o [address] [place],  [address] o [phone] o Mon-Fri 8:00-5:30, Sat (Oct.-Mar.) 9:00-1:00 o Accepting Medicaid  [place]  ([address]) .   [facility] o [address] [place], [place] [address] o [phone] o Mon-Fri 8:00-5:00 o Accepting Medicaid   [facility] [phone]) . [facility] o [address], [place], [place] [address] o [phone] o Mon-Fri 8:00-5:00 . Nature conservation officer at [facility] o [address], [place], [place] [address] o [phone] o Mon-Fri 8:00-5:00 . [facility] [place] o [address], [place], [place] [address] o [phone] o Mon-Fri 8:00-5:00 o After hours clinic [facility] [place], [place] [address]) [phone] Mon-Fri 5:00-8:00, Sat 12:00-6:00, Sun 10:00-4:00 o Accepting Medicaid . [facility] o [address] [place], [place]  [address] o [phone]   Fax - [phone]  [place] [phone]) . Nature conservation officer at [facility] o [address], [place], [place] [address] o [phone] o Mon-Fri 8:00-5:00 . [facility] [place] [facility] [place]) o [address], [place], [place] [address] o [phone] o Mon-Thur 8:00-7:00, Fri 8:00-5:00, Sat 8:00-12:00         Postpartum Care After Vaginal Delivery This sheet gives you information about how to care for yourself from the time you deliver your baby to up to 6-12 weeks after delivery (postpartum period). Your health care provider may also give you more specific instructions. If you have problems or questions, contact your health care provider. Follow these instructions at home: Vaginal bleeding  It is normal to have vaginal bleeding (lochia) after delivery. Wear a sanitary pad for vaginal bleeding and discharge. ? During the first week after delivery, the amount and appearance of lochia is often similar to a menstrual period. ? Over the next few weeks, it will gradually decrease to a dry, yellow-brown discharge. ? For most women, lochia  stops completely by 4-6 weeks after delivery. Vaginal bleeding can vary from woman to woman.  Change your sanitary pads frequently. Watch for any changes in your flow, such as: ? A sudden increase in volume. ? A change in color. ? Large blood clots.  If you pass a blood clot from your vagina, save it and call your health care provider to discuss. Do not flush blood clots down the toilet before talking with your health care provider.  Do not use tampons or douches until your health care provider says this is safe.  If you are not breastfeeding, your period should return 6-8 weeks after delivery. If you are feeding your child breast milk only (exclusive breastfeeding), your period may not return until you stop breastfeeding. Perineal care  Keep the area between the vagina and the anus (perineum) clean and dry as told by your health care provider. Use medicated pads and pain-relieving sprays and creams as directed.  If you had a cut in the perineum (episiotomy) or a tear in the vagina, check the area for signs of infection until you are healed. Check for: ? More redness, swelling, or pain. ? Fluid or blood coming from the cut or tear. ? Warmth. ? Pus or a bad smell.  You may be given a squirt bottle to use instead of wiping to clean the perineum area after you go to the bathroom. As you start healing, you may use the squirt bottle before wiping yourself. Make sure to wipe gently.  To relieve pain caused by an episiotomy, a tear in the vagina, or swollen veins in the anus (hemorrhoids), try taking  a warm sitz bath 2-3 times a day. A sitz bath is a warm water bath that is taken while you are sitting down. The water should only come up to your hips and should cover your buttocks. Breast care  Within the first few days after delivery, your breasts may feel heavy, full, and uncomfortable (breast engorgement). Milk may also leak from your breasts. Your health care provider can suggest ways to help  relieve the discomfort. Breast engorgement should go away within a few days.  If you are breastfeeding: ? Wear a bra that supports your breasts and fits you well. ? Keep your nipples clean and dry. Apply creams and ointments as told by your health care provider. ? You may need to use breast pads to absorb milk that leaks from your breasts. ? You may have uterine contractions every time you breastfeed for up to several weeks after delivery. Uterine contractions help your uterus return to its normal size. ? If you have any problems with breastfeeding, work with your health care provider or Advertising copywriter.  If you are not breastfeeding: ? Avoid touching your breasts a lot. Doing this can make your breasts produce more milk. ? Wear a good-fitting bra and use cold packs to help with swelling. ? Do not squeeze out (express) milk. This causes you to make more milk. Intimacy and sexuality  Ask your health care provider when you can engage in sexual activity. This may depend on: ? Your risk of infection. ? How fast you are healing. ? Your comfort and desire to engage in sexual activity.  You are able to get pregnant after delivery, even if you have not had your period. If desired, talk with your health care provider about methods of birth control (contraception). Medicines  Take over-the-counter and prescription medicines only as told by your health care provider.  If you were prescribed an antibiotic medicine, take it as told by your health care provider. Do not stop taking the antibiotic even if you start to feel better. Activity  Gradually return to your normal activities as told by your health care provider. Ask your health care provider what activities are safe for you.  Rest as much as possible. Try to rest or take a nap while your baby is sleeping. Eating and drinking   Drink enough fluid to keep your urine pale yellow.  Eat high-fiber foods every day. These may help prevent  or relieve constipation. High-fiber foods include: ? Whole grain cereals and breads. ? Brown rice. ? Beans. ? Fresh fruits and vegetables.  Do not try to lose weight quickly by cutting back on calories.  Take your prenatal vitamins until your postpartum checkup or until your health care provider tells you it is okay to stop. Lifestyle  Do not use any products that contain nicotine or tobacco, such as cigarettes and e-cigarettes. If you need help quitting, ask your health care provider.  Do not drink alcohol, especially if you are breastfeeding. General instructions  Keep all follow-up visits for you and your baby as told by your health care provider. Most women visit their health care provider for a postpartum checkup within the first 3-6 weeks after delivery. Contact a health care provider if:  You feel unable to cope with the changes that your child brings to your life, and these feelings do not go away.  You feel unusually sad or worried.  Your breasts become red, painful, or hard.  You have a fever.  You have  trouble holding urine or keeping urine from leaking.  You have little or no interest in activities you used to enjoy.  You have not breastfed at all and you have not had a menstrual period for 12 weeks after delivery.  You have stopped breastfeeding and you have not had a menstrual period for 12 weeks after you stopped breastfeeding.  You have questions about caring for yourself or your baby.  You pass a blood clot from your vagina. Get help right away if:  You have chest pain.  You have difficulty breathing.  You have sudden, severe leg pain.  You have severe pain or cramping in your lower abdomen.  You bleed from your vagina so much that you fill more than one sanitary pad in one hour. Bleeding should not be heavier than your heaviest period.  You develop a severe headache.  You faint.  You have blurred vision or spots in your vision.  You have  bad-smelling vaginal discharge.  You have thoughts about hurting yourself or your baby. If you ever feel like you may hurt yourself or others, or have thoughts about taking your own life, get help right away. You can go to the nearest emergency department or call:  Your local emergency services (911 in the [place]  A suicide crisis helpline, such as the National Suicide Prevention Lifeline at 564-290-2498. This is open 24 hours a day. Summary  The period of time right after you deliver your newborn up to 6-12 weeks after delivery is called the postpartum period.  Gradually return to your normal activities as told by your health care provider.  Keep all follow-up visits for you and your baby as told by your health care provider. This information is not intended to replace advice given to you by your health care provider. Make sure you discuss any questions you have with your health care provider. Document Revised: 01/09/2017 Document Reviewed: 10/20/2016 Elsevier Patient Education  2020 Elsevier Inc.        Postpartum Hypertension Postpartum hypertension is high blood pressure that remains higher than normal after childbirth. You may not realize that you have postpartum hypertension if your blood pressure is not being checked regularly. In most cases, postpartum hypertension will go away on its own, usually within a week of delivery. However, for some women, medical treatment is required to prevent serious complications, such as seizures or stroke. What are the causes? This condition may be caused by one or more of the following:  Hypertension that existed before pregnancy (chronic hypertension).  Hypertension that comes on as a result of pregnancy (gestational hypertension).  Hypertensive disorders during pregnancy (preeclampsia) or seizures in women who have high blood pressure during pregnancy (eclampsia).  A condition in which the liver, platelets, and red blood cells are  damaged during pregnancy (HELLP syndrome).  A condition in which the thyroid produces too much hormones (hyperthyroidism).  Other rare problems of the nerves (neurological disorders) or blood disorders. In some cases, the cause may not be known. What increases the risk? The following factors may make you more likely to develop this condition:  Chronic hypertension. In some cases, this may not have been diagnosed before pregnancy.  Obesity.  Type 2 diabetes.  Kidney disease.  History of preeclampsia or eclampsia.  Other medical conditions that change the level of hormones in the body (hormonal imbalance). What are the signs or symptoms? As with all types of hypertension, postpartum hypertension may not have any symptoms. Depending on how high  your blood pressure is, you may experience:  Headaches. These may be mild, moderate, or severe. They may also be steady, constant, or sudden in onset (thunderclap headache).  Changes in your ability to see (visual changes).  Dizziness.  Shortness of breath.  Swelling of your hands, feet, lower legs, or face. In some cases, you may have swelling in more than one of these locations.  Heart palpitations or a racing heartbeat.  Difficulty breathing while lying down.  Decrease in the amount of urine that you pass. Other rare signs and symptoms may include:  Sweating more than usual. This lasts longer than a few days after delivery.  Chest pain.  Sudden dizziness when you get up from sitting or lying down.  Seizures.  Nausea or vomiting.  Abdominal pain. How is this diagnosed? This condition may be diagnosed based on the results of a physical exam, blood pressure measurements, and blood and urine tests. You may also have other tests, such as a CT scan or an MRI, to check for other problems of postpartum hypertension. How is this treated? If blood pressure is high enough to require treatment, your options may include:  Medicines  to reduce blood pressure (antihypertensives). Tell your health care provider if you are breastfeeding or if you plan to breastfeed. There are many antihypertensive medicines that are safe to take while breastfeeding.  Stopping medicines that may be causing hypertension.  Treating medical conditions that are causing hypertension.  Treating the complications of hypertension, such as seizures, stroke, or kidney problems. Your health care provider will also continue to monitor your blood pressure closely until it is within a safe range for you. Follow these instructions at home:  Take over-the-counter and prescription medicines only as told by your health care provider.  Return to your normal activities as told by your health care provider. Ask your health care provider what activities are safe for you.  Do not use any products that contain nicotine or tobacco, such as cigarettes and e-cigarettes. If you need help quitting, ask your health care provider.  Keep all follow-up visits as told by your health care provider. This is important. Contact a health care provider if:  Your symptoms get worse.  You have new symptoms, such as: ? A headache that does not get better. ? Dizziness. ? Visual changes. Get help right away if:  You suddenly develop swelling in your hands, ankles, or face.  You have sudden, rapid weight gain.  You develop difficulty breathing, chest pain, racing heartbeat, or heart palpitations.  You develop severe pain in your abdomen.  You have any symptoms of a stroke. "BE FAST" is an easy way to remember the main warning signs of a stroke: ? B - Balance. Signs are dizziness, sudden trouble walking, or loss of balance. ? E - Eyes. Signs are trouble seeing or a sudden change in vision. ? F - Face. Signs are sudden weakness or numbness of the face, or the face or eyelid drooping on one side. ? A - Arms. Signs are weakness or numbness in an arm. This happens suddenly and  usually on one side of the body. ? S - Speech. Signs are sudden trouble speaking, slurred speech, or trouble understanding what people say. ? T - Time. Time to call emergency services. Write down what time symptoms started.  You have other signs of a stroke, such as: ? A sudden, severe headache with no known cause. ? Nausea or vomiting. ? Seizure. These symptoms may  represent a serious problem that is an emergency. Do not wait to see if the symptoms will go away. Get medical help right away. Call your local emergency services (911 in the [place] Do not drive yourself to the hospital. Summary  Postpartum hypertension is high blood pressure that remains higher than normal after childbirth.  In most cases, postpartum hypertension will go away on its own, usually within a week of delivery.  For some women, medical treatment is required to prevent serious complications, such as seizures or stroke. This information is not intended to replace advice given to you by your health care provider. Make sure you discuss any questions you have with your health care provider. Document Revised: 02/12/2018 Document Reviewed: 10/27/2016 Elsevier Patient Education  2020 Elsevier Inc.        Preeclampsia and Eclampsia Preeclampsia is a serious condition that may develop during pregnancy. This condition causes high blood pressure and increased protein in your urine along with other symptoms, such as headaches and vision changes. These symptoms may develop as the condition gets worse. Preeclampsia may occur at 20 weeks of pregnancy or later. Diagnosing and treating preeclampsia early is very important. If not treated early, it can cause serious problems for you and your baby. One problem it can lead to is eclampsia. Eclampsia is a condition that causes muscle jerking or shaking (convulsions or seizures) and other serious problems for the mother. During pregnancy, delivering your baby may be the best treatment  for preeclampsia or eclampsia. For most women, preeclampsia and eclampsia symptoms go away after giving birth. In rare cases, a woman may develop preeclampsia after giving birth (postpartum preeclampsia). This usually occurs within 48 hours after childbirth but may occur up to 6 weeks after giving birth. What are the causes? The cause of preeclampsia is not known. What increases the risk? The following risk factors make you more likely to develop preeclampsia:  Being pregnant for the first time.  Having had preeclampsia during a past pregnancy.  Having a family history of preeclampsia.  Having high blood pressure.  Being pregnant with more than one baby.  Being 47 or older.  Being African-American.  Having kidney disease or diabetes.  Having medical conditions such as lupus or blood diseases.  Being very overweight (obese). What are the signs or symptoms? The most common symptoms are:  Severe headaches.  Vision problems, such as blurred or double vision.  Abdominal pain, especially upper abdominal pain. Other symptoms that may develop as the condition gets worse include:  Sudden weight gain.  Sudden swelling of the hands, face, legs, and feet.  Severe nausea and vomiting.  Numbness in the face, arms, legs, and feet.  Dizziness.  Urinating less than usual.  Slurred speech.  Convulsions or seizures. How is this diagnosed? There are no screening tests for preeclampsia. Your health care provider will ask you about symptoms and check for signs of preeclampsia during your prenatal visits. You may also have tests that include:  Checking your blood pressure.  Urine tests to check for protein. Your health care provider will check for this at every prenatal visit.  Blood tests.  Monitoring your baby's heart rate.  Ultrasound. How is this treated? You and your health care provider will determine the treatment approach that is best for you. Treatment may  include:  Having more frequent prenatal exams to check for signs of preeclampsia, if you have an increased risk for preeclampsia.  Medicine to lower your blood pressure.  Staying in  the hospital, if your condition is severe. There, treatment will focus on controlling your blood pressure and the amount of fluids in your body (fluid retention).  Taking medicine (magnesium sulfate) to prevent seizures. This may be given as an injection or through an IV.  Taking a low-dose aspirin during your pregnancy.  Delivering your baby early. You may have your labor started with medicine (induced), or you may have a cesarean delivery. Follow these instructions at home: Eating and drinking   Drink enough fluid to keep your urine pale yellow.  Avoid caffeine. Lifestyle  Do not use any products that contain nicotine or tobacco, such as cigarettes and e-cigarettes. If you need help quitting, ask your health care provider.  Do not use alcohol or drugs.  Avoid stress as much as possible. Rest and get plenty of sleep. General instructions  Take over-the-counter and prescription medicines only as told by your health care provider.  When lying down, lie on your left side. This keeps pressure off your major blood vessels.  When sitting or lying down, raise (elevate) your feet. Try putting some pillows underneath your lower legs.  Exercise regularly. Ask your health care provider what kinds of exercise are best for you.  Keep all follow-up and prenatal visits as told by your health care provider. This is important. How is this prevented? There is no known way of preventing preeclampsia or eclampsia from developing. However, to lower your risk of complications and detect problems early:  Get regular prenatal care. Your health care provider may be able to diagnose and treat the condition early.  Maintain a healthy weight. Ask your health care provider for help managing weight gain during  pregnancy.  Work with your health care provider to manage any long-term (chronic) health conditions you have, such as diabetes or kidney problems.  You may have tests of your blood pressure and kidney function after giving birth.  Your health care provider may have you take low-dose aspirin during your next pregnancy. Contact a health care provider if:  You have symptoms that your health care provider told you may require more treatment or monitoring, such as: ? Headaches. ? Nausea or vomiting. ? Abdominal pain. ? Dizziness. ? Light-headedness. Get help right away if:  You have severe: ? Abdominal pain. ? Headaches that do not get better. ? Dizziness. ? Vision problems. ? Confusion. ? Nausea or vomiting.  You have any of the following: ? A seizure. ? Sudden, rapid weight gain. ? Sudden swelling in your hands, ankles, or face. ? Trouble moving any part of your body. ? Numbness in any part of your body. ? Trouble speaking. ? Abnormal bleeding.  You faint. Summary  Preeclampsia is a serious condition that may develop during pregnancy.  This condition causes high blood pressure and increased protein in your urine along with other symptoms, such as headaches and vision changes.  Diagnosing and treating preeclampsia early is very important. If not treated early, it can cause serious problems for you and your baby.  Get help right away if you have symptoms that your health care provider told you to watch for. This information is not intended to replace advice given to you by your health care provider. Make sure you discuss any questions you have with your health care provider. Document Revised: 09/08/2017 Document Reviewed: 08/13/2015 Elsevier Patient Education  [place].

## 2019-06-20 NOTE — MAU Note (Signed)
Wanting to know if she can get some more of the medicine to drain the fluid from her legs.  They said it would make her go to the bathroom more, but it really didn't. Needs a refill of Ibuprofen, when it wears off she really hurts, like in her uterus - feels like it dropped and in her low back.  Vag del 5/20 (preterm due to Pre E)

## 2019-06-20 NOTE — MAU Note (Signed)
covid + 4/27; asymptomatic now.

## 2019-06-20 NOTE — MAU Note (Signed)
Cycling BP. Pt unable to void at this time. Denies HA, visual changes, epigastric pain, +swelling in lower extremities.

## 2019-06-20 NOTE — MAU Provider Note (Signed)
History     CSN: [id number]  Arrival date and time: 06/20/19 6767   First Provider Initiated Contact with Patient 06/20/19 1057      Chief Complaint  Patient presents with  . Leg Swelling  . refills   [name] is a 24 y.o. G1P0101 at Unknown who presents to MAU for "a refill of the medicine that can help my swelling and ibuprofen." Patient reports she was given 3 days worth of Lasix and blood pressure medication. Patient reports she only has 2 pills of ibuprofen left and wants it for back pain.  Patient reports she did not have any back pain prior to pregnancy. Patient reports she did not have any back pain during pregnancy either. Patient reports that her back pain started after delivery. Pt reports having an epidural during labor. Patient reports having back pain in the right, lower back, away from her epidural site. Patient reports pain feels sharp, and is constant and is worse when walking and does not have pain at rest. Patient reports ibuprofen is the only thing that makes it feel better. Patient reports pain is 8/10 without ibuprofen. Patient takes ibuprofen every 6 hours and takes 500mg  of Tylenol every 6 hours because the ibuprofen wears off before it is time for the next dose. Patient is taking 600mg  of ibuprofen that she was prescribed from the hospital.  Patient reports swelling in lower extremities started after she had the baby. Patient NSVD on 06/09/2019 d/t severe preeclampsia and received magnesium in the hospital. Patient states she doesn't think the Lasix worked last time because she was still "out at the stores" and cannot put on shoes. Patient states the swelling is still bad, but it is better compared to prior to her starting Lasix after her last MAU visit. Patient reports she thinks it did not work because she did not elevate her feet. Patient denies a diet high in salt.  Patient found to have elevated BP in MAU. Patient does not take her blood pressure at  home. Patient reports at 930AM she took her blood pressure medication, enalapril 5mg . Denies s/sx of preeclampsia and states she feels well.  Pt denies vaginal discharge/odor/itching. Pt denies N/V, abdominal pain, constipation, diarrhea, or urinary problems. Pt denies fever, chills, fatigue, sweating or changes in appetite. Pt denies SOB or chest pain. Pt denies dizziness, HA, light-headedness, weakness.  Problems this pregnancy include: GDM, PEC, COVID PNA 04/28 Allergies? NKDA Current medications/supplements? Ibuprofen, Tylenol, enalapril, PNVs Pregnant/postpartum/breastfeeding? Breastfeeding, postpartum Prenatal care provider? [facility], no appt scheduled   OB History    Gravida  1   Para  1   Term      Preterm  1   AB      Living  1     SAB      TAB      Ectopic      Multiple  0   Live Births  1           Past Medical History:  Diagnosis Date  . Chlamydia   . COVID-19   . Eczema   . Gestational diabetes   . Gonorrhea   . Pregnancy induced hypertension     Past Surgical History:  Procedure Laterality Date  . LYMPHADENECTOMY    . WISDOM TOOTH EXTRACTION      Family History  Problem Relation Age of Onset  . Diabetes Mother   . Hypertension Mother   . Hypertension Father  Social History   Tobacco Use  . Smoking status: Former Smoker    Types: Cigars  . Smokeless tobacco: Former Neurosurgeon  . Tobacco comment: patient currently does not smoke   Substance Use Topics  . Alcohol use: Not Currently    Comment: socially  . Drug use: Not Currently    Allergies: No Known Allergies  No medications prior to admission.    Review of Systems  Constitutional: Negative for chills, diaphoresis, fatigue and fever.  Eyes: Negative for visual disturbance.  Respiratory: Negative for shortness of breath.   Cardiovascular: Negative for chest pain.  Gastrointestinal: Negative for abdominal pain, constipation, diarrhea, nausea and vomiting.   Genitourinary: Negative for dysuria, flank pain, frequency, pelvic pain, urgency, vaginal bleeding and vaginal discharge.  Musculoskeletal: Positive for back pain (right-sided LBP).       Swelling in bilateral extremities.  Neurological: Negative for dizziness, weakness, light-headedness and headaches.   Physical Exam   Blood pressure (!) 147/95, pulse 73, temperature 99 F (37.2 C), temperature source Oral, resp. rate 18, weight 130.7 kg, SpO2 100 %, currently breastfeeding.  Patient Vitals for the past 24 hrs:  BP Temp Temp src Pulse Resp SpO2 Weight  06/20/19 1400 (!) 147/95 -- -- 73 18 -- --  06/20/19 1145 (!) 145/77 -- -- 81 -- -- --  06/20/19 1130 (!) 149/75 -- -- 82 -- -- --  06/20/19 1115 (!) 152/81 -- -- 82 -- -- --  06/20/19 1100 (!) 144/86 -- -- 86 -- -- --  06/20/19 1045 (!) 155/87 -- -- 90 -- -- --  06/20/19 1030 140/80 -- -- 92 -- -- --  06/20/19 1015 (!) 151/82 -- -- 92 -- -- --  06/20/19 1000 139/80 -- -- 89 -- -- --  06/20/19 0950 (!) 157/94 -- -- 97 -- -- --  06/20/19 0947 (!) 149/79 -- -- 95 -- -- --  06/20/19 0935 (!) 156/76 99 F (37.2 C) Oral 99 18 100 % 130.7 kg   Physical Exam  Constitutional: She is oriented to person, place, and time. She appears well-developed and well-nourished. No distress.  HENT:  Head: Normocephalic and atraumatic.  Respiratory: Effort normal.  Musculoskeletal:     Comments: Bilateral, lower extremity swelling, slightly worse on left leg compared to right, 2+ pitting edema from mid-calf to feet, swelling up to knee. No evidence of DVT on exam.  Neurological: She is alert and oriented to person, place, and time.  Skin: She is not diaphoretic.  Psychiatric: She has a normal mood and affect. Her behavior is normal. Judgment and thought content normal.   Results for orders placed or performed during the hospital encounter of 06/20/19 (from the past 24 hour(s))  CBC     Status: Abnormal   Collection Time: 06/20/19 11:34 AM  Result  Value Ref Range   WBC 6.5 4.0 - 10.5 K/uL   RBC 3.81 (L) 3.87 - 5.11 MIL/uL   Hemoglobin 10.7 (L) 12.0 - 15.0 g/dL   HCT 35.3 (L) 29.9 - 24.2 %   MCV 91.1 80.0 - 100.0 fL   MCH 28.1 26.0 - 34.0 pg   MCHC 30.8 30.0 - 36.0 g/dL   RDW 68.3 41.9 - 62.2 %   Platelets 328 150 - 400 K/uL   nRBC 0.0 0.0 - 0.2 %  Comprehensive metabolic panel     Status: Abnormal   Collection Time: 06/20/19 11:34 AM  Result Value Ref Range   Sodium 139 135 - 145 mmol/L   Potassium  4.6 3.5 - 5.1 mmol/L   Chloride 107 98 - 111 mmol/L   CO2 24 22 - 32 mmol/L   Glucose, Bld 77 70 - 99 mg/dL   BUN 6 6 - 20 mg/dL   Creatinine, Ser 0.69 0.44 - 1.00 mg/dL   Calcium 9.0 8.9 - 10.3 mg/dL   Total Protein 6.6 6.5 - 8.1 g/dL   Albumin 3.0 (L) 3.5 - 5.0 g/dL   AST 24 15 - 41 U/L   ALT 20 0 - 44 U/L   Alkaline Phosphatase 82 38 - 126 U/L   Total Bilirubin 0.4 0.3 - 1.2 mg/dL   GFR calc non Af Amer >60 >60 mL/min   GFR calc Af Amer >60 >60 mL/min   Anion gap 8 5 - 15   MAU Course  Procedures  MDM -pt here with right-sided LBP present since delivery necessitating ibuprofen and tylenol alternating every 3 hours -pt also presents with bilateral, pitting edema in lower extremities, extending to knee -elevated BP without severe range pressures, was treated for severe preeclampsia and delivered at 33 weeks and treated with magnesium 06/09/2019 -CBC: normal for ppartum state -CMP: WNL, liver enzymes normal -consulted with Dr. Glo Herring who recommends increasing enalapril to 10mg , lasix x 10days at 20mg /day -Dr. Lucillie Garfinkel notified by phone and requests patient call office tomorrow to schedule BP check. Dr. Royston Sinner reports patient was scheduled to come this past Friday for a BP check, but missed her appointment. -pt discharged to home in stable condition  Orders Placed This Encounter  Procedures  . Urinalysis, Routine w reflex microscopic    Standing Status:   Standing    Number of Occurrences:   1  . CBC    Standing  Status:   Standing    Number of Occurrences:   1  . Comprehensive metabolic panel    Standing Status:   Standing    Number of Occurrences:   1  . Discharge patient    Order Specific Question:   Discharge disposition    Answer:   01-Home or Self Care [1]    Order Specific Question:   Discharge patient date    Answer:   06/20/2019   Meds ordered this encounter  Medications  . enalapril (VASOTEC) 10 MG tablet    Sig: Take 1 tablet (10 mg total) by mouth daily.    Dispense:  30 tablet    Refill:  0    Order Specific Question:   Supervising Provider    Answer:   Jonnie Kind [[id number]]  . furosemide (LASIX) 20 MG tablet    Sig: Take 1 tablet (20 mg total) by mouth daily for 10 days.    Dispense:  10 tablet    Refill:  0    Order Specific Question:   Supervising Provider    Answer:   Jonnie Kind [[id number]]  . Acetaminophen-Codeine (TYLENOL/CODEINE #3) 300-30 MG tablet    Sig: Take 1 tablet by mouth every 6 (six) hours as needed for pain.    Dispense:  10 tablet    Refill:  0    Order Specific Question:   Supervising Provider    Answer:   Jonnie Kind [[id number]]    Assessment and Plan   1. Lower extremity edema   2. Postpartum state   3. Acute right-sided low back pain without sciatica   4. Pre-eclampsia in third trimester     Allergies as of 06/20/2019   No Known Allergies  Medication List    STOP taking these medications   Oxycodone HCl 10 MG Tabs     TAKE these medications   acetaminophen 500 MG tablet Commonly known as: TYLENOL Take 500 mg by mouth every 6 (six) hours as needed.   Acetaminophen-Codeine 300-30 MG tablet Commonly known as: TYLENOL/CODEINE #3 Take 1 tablet by mouth every 6 (six) hours as needed for pain.   enalapril 10 MG tablet Commonly known as: VASOTEC Take 1 tablet (10 mg total) by mouth daily. What changed:   medication strength  how much to take   furosemide 20 MG tablet Commonly known as: Lasix Take 1 tablet (20 mg total) by  mouth daily for 10 days. What changed: when to take this   ibuprofen 600 MG tablet Commonly known as: ADVIL Take 1 tablet (600 mg total) by mouth every 6 (six) hours.   Vitafol Gummies 3.33-0.333-34.8 MG Chew Chew 3 each by mouth daily.       -f/u with PCP for back pain, list given -RX tylenol 3 with codeine, discussed purchase of non-prescription strength ibuprofen OTC -RX enalapril 10mg , pt to call [facility] tomorrow for BP check per Dr. , pt asked to come to MAU tomorrow for BP check and was instructed not to come to hospital but to go to [facility] office for BP check and routine management of BP -RX lasix for pitting edema -s/sx of preeclampsia discussed -return MAU precautions given -pt discharged to home in stable condition  [facility] Kona Yusuf 06/20/2019, 3:57 PM

## 2019-06-22 ENCOUNTER — Encounter (HOSPITAL_COMMUNITY): Payer: Self-pay | Admitting: Emergency Medicine

## 2019-06-22 ENCOUNTER — Other Ambulatory Visit: Payer: Self-pay

## 2019-06-22 ENCOUNTER — Ambulatory Visit (HOSPITAL_COMMUNITY)
Admission: EM | Admit: 2019-06-22 | Discharge: 2019-06-22 | Disposition: A | Discharge status: Home / Self Care | Payer: Managed Care, Other (non HMO) | Attending: Internal Medicine | Admitting: Internal Medicine

## 2019-06-22 DIAGNOSIS — Z8616 Personal history of COVID-19: Secondary | ICD-10-CM | POA: Insufficient documentation

## 2019-06-22 DIAGNOSIS — Z113 Encounter for screening for infections with a predominantly sexual mode of transmission: Secondary | ICD-10-CM | POA: Insufficient documentation

## 2019-06-22 LAB — POCT URINALYSIS DIP (DEVICE)
Bilirubin Urine: NEGATIVE
Glucose, UA: NEGATIVE mg/dL
Ketones, ur: NEGATIVE mg/dL
Leukocytes,Ua: NEGATIVE
Nitrite: NEGATIVE
Protein, ur: 100 mg/dL — AB
Specific Gravity, Urine: 1.02 (ref 1.005–1.030)
Urobilinogen, UA: 0.2 mg/dL (ref 0.0–1.0)
pH: 7 (ref 5.0–8.0)

## 2019-06-22 NOTE — ED Triage Notes (Signed)
Pt

## 2019-06-22 NOTE — ED Provider Notes (Signed)
[place]    CSN: [id number] Arrival date & time: 06/22/19  1815      History   Chief Complaint Chief Complaint  Patient presents with  . Urinary Tract Infection    HPI Kelli Mcpherson is a 24 y.o. female comes to the urgent care requesting STD screen.  Patient had sexual intercourse with her boyfriend and the condom broke.   Patient denies any vaginal discharge, dysuria, urgency or frequency.  She is concerned that she is at risk of getting pregnant.  No fever or chills.  HPI  Past Medical History:  Diagnosis Date  . Chlamydia   . COVID-19   . Eczema   . Gestational diabetes   . Gonorrhea   . Pregnancy induced hypertension     Patient Active Problem List   Diagnosis Date Noted  . Pre-eclampsia 06/08/2019  . COVID-19 affecting pregnancy in third trimester 05/29/2019  . DKA (diabetic ketoacidosis) ([place]) 05/29/2019  . Vaginal discharge 02/04/2018  . Costochondral chest pain 10/13/2017  . Episodic tension-type headache, not intractable 10/13/2017  . Obesity 08/01/2015    Past Surgical History:  Procedure Laterality Date  . LYMPHADENECTOMY    . WISDOM TOOTH EXTRACTION      OB History    Gravida  1   Para  1   Term      Preterm  1   AB      Living  1     SAB      TAB      Ectopic      Multiple  0   Live Births  1            Home Medications    Prior to Admission medications   Medication Sig Start Date End Date Taking? Authorizing Provider  acetaminophen (TYLENOL) 500 MG tablet Take 500 mg by mouth every 6 (six) hours as needed.    [provider]  Acetaminophen-Codeine (TYLENOL/CODEINE #3) 300-30 MG tablet Take 1 tablet by mouth every 6 (six) hours as needed for pain. 06/20/19   Nugent, Gerrie Nordmann, NP  enalapril (VASOTEC) 10 MG tablet Take 1 tablet (10 mg total) by mouth daily. 06/20/19 07/20/19  Nugent, Gerrie Nordmann, NP  furosemide (LASIX) 20 MG tablet Take 1 tablet (20 mg total) by mouth daily for 10 days. 06/20/19  06/30/19  Nugent, Gerrie Nordmann, NP  ibuprofen (ADVIL) 600 MG tablet Take 1 tablet (600 mg total) by mouth every 6 (six) hours. 06/11/19   Dian Queen, MD  Prenatal Vit-Fe Phos-FA-Omega (VITAFOL GUMMIES) 3.33-0.333-34.8 MG CHEW Chew 3 each by mouth daily. 04/13/19   [provider]    Family History Family History  Problem Relation Age of Onset  . Diabetes Mother   . Hypertension Mother   . Hypertension Father     Social History Social History   Tobacco Use  . Smoking status: Former Smoker    Types: Cigars  . Smokeless tobacco: Former Systems developer  . Tobacco comment: patient currently does not smoke   Substance Use Topics  . Alcohol use: Not Currently    Comment: socially  . Drug use: Not Currently     Allergies   Patient has no known allergies.   Review of Systems Review of Systems  Gastrointestinal: Negative.  Negative for abdominal pain, nausea and vomiting.  Genitourinary: Negative for dyspareunia, dysuria, frequency, urgency, vaginal discharge and vaginal pain.  Musculoskeletal: Negative.   Psychiatric/Behavioral: Negative.      Physical Exam Triage Vital Signs  ED Triage Vitals  Enc Vitals Group     BP 06/22/19 1859 (!) 148/90     Pulse Rate 06/22/19 1859 88     Resp 06/22/19 1859 16     Temp 06/22/19 1859 98.3 F (36.8 C)     Temp Source 06/22/19 1859 Oral     SpO2 06/22/19 1859 100 %     Weight --      Height --      Head Circumference --      Peak Flow --      Pain Score 06/22/19 1928 0     Pain Loc --      Pain Edu? --      Excl. in GC? --    No data found.  Updated Vital Signs BP (!) 148/90 (BP Location: Left Arm)   Pulse 88   Temp 98.3 F (36.8 C) (Oral)   Resp 16   SpO2 100%   Visual Acuity Right Eye Distance:   Left Eye Distance:   Bilateral Distance:    Right Eye Near:   Left Eye Near:    Bilateral Near:     Physical Exam Vitals and nursing note reviewed.  Constitutional:      Appearance: Normal appearance.    Cardiovascular:     Rate and Rhythm: Normal rate and regular rhythm.  Abdominal:     General: Bowel sounds are normal.     Palpations: Abdomen is soft.  Neurological:     Mental Status: She is alert.      UC Treatments / Results  Labs (all labs ordered are listed, but only abnormal results are displayed) Labs Reviewed  CERVICOVAGINAL ANCILLARY ONLY    EKG   Radiology No results found.  Procedures Procedures (including critical care time)  Medications Ordered in UC Medications - No data to display  Initial Impression / Assessment and Plan / UC Course  I have reviewed the triage vital signs and the nursing notes.  Pertinent labs & imaging results that were available during my care of the patient were reviewed by me and considered in my medical decision making (see chart for details).     1.  STD screen: Cervical swab for GC/chlamydia/trichomonas BV/yeast Point-of-care urinalysis I spoke with the patient concern for getting pregnant.  I told the patient that it is highly unlikely that she is pregnant given the fact that she just had a baby 12 days ago. Final Clinical Impressions(s) / UC Diagnoses   Final diagnoses:  Screening examination for STD (sexually transmitted disease)   Discharge Instructions   None    ED Prescriptions    None     PDMP not reviewed this encounter.   Merrilee Jansky, MD 06/22/19 [phone]

## 2019-06-22 NOTE — ED Triage Notes (Signed)
Patient was seen by provider.  Patient had a baby 12 days ago.  Child remains in NICU.  Patient has had intercourse and condom broke

## 2019-06-23 LAB — CERVICOVAGINAL ANCILLARY ONLY
Bacterial Vaginitis (gardnerella): POSITIVE — AB
Candida Glabrata: NEGATIVE
Candida Vaginitis: NEGATIVE
Chlamydia: NEGATIVE
Comment: NEGATIVE
Comment: NEGATIVE
Comment: NEGATIVE
Comment: NEGATIVE
Comment: NEGATIVE
Comment: NORMAL
Neisseria Gonorrhea: NEGATIVE
Trichomonas: NEGATIVE

## 2019-06-25 ENCOUNTER — Encounter (HOSPITAL_COMMUNITY): Payer: Self-pay | Admitting: Obstetrics & Gynecology

## 2019-06-27 ENCOUNTER — Ambulatory Visit: Payer: Self-pay

## 2019-06-27 ENCOUNTER — Telehealth (HOSPITAL_COMMUNITY): Payer: Self-pay | Admitting: Orthopedic Surgery

## 2019-06-27 ENCOUNTER — Telehealth (HOSPITAL_COMMUNITY): Payer: Self-pay

## 2019-06-27 MED ORDER — METRONIDAZOLE 500 MG PO TABS
500.0000 mg | ORAL_TABLET | Freq: Two times a day (BID) | ORAL | 0 refills | Status: AC
Start: 2019-06-27 — End: 2019-07-04

## 2019-06-27 NOTE — Lactation Note (Signed)
This note was copied from a baby's chart. Lactation Consultation Note  Patient Name: Kelli Mcpherson Date: 06/27/2019 Reason for consult: Follow-up assessment;1st time breastfeeding;Primapara;NICU baby;Late-preterm 34-36.6wks  SLP requested LC to assist with 3 pm feeding.  Baby 24 weeks old and AGA [redacted]w[redacted]d.  Baby has been nuzzling at the breast, and gavage fed EBM/formula.  Mom just gave baby a bath.  Baby showing cues.  Mom removed her shirt to allow for STS, baby undressed down to diaper.    A few attempts made at latching, assisting Mom with good head support and breast support.  Hand expressed big drops of milk onto Mom's nipple.  Baby licking and opening his mouth onto nipple.  Mom's nipple is short shafted, areola very compressible.  Initiated a 20 mm nipple shield, showing Mom how to properly apply (turning partly inside out prior to placing over nipple, to draw nipple into shield).  Baby able to latch onto base of nipple and feel the stimulus on the roof of his mouth, he began to suckle.  Assisted Mom to firmly support and sandwich her breast during the feeding.  As baby continued to suck, swallowing identified audibly and with drop in jaw.  Mom taught to use alternate breast compression to increase milk transfer.  Attempts made to help baby drop the jaw deeper on the breast, but unable to at this time.  Lots of teaching done with Mom, who was thrilled with the swallowing heard.    Baby remained nutritive for 15 mins, before he started getting sleepy and pushing back from breast.  Nipple noted to be pulled a bit further into shield, and milk noted in tip.  Assisted Mom in burping baby, and placed him STS on Mom's chest.    Encouraged Mom to continue with her pumping, which she is doing every 2-3 hrs and expressing about 3 oz total with each pumping.   Offered another [facility] consult at 3pm tomorrow and Mom is interested.    Feeding Feeding Type: Breast Fed  LATCH Score Latch:  Grasps breast easily, tongue down, lips flanged, rhythmical sucking.  Audible Swallowing: Spontaneous and intermittent  Type of Nipple: Everted at rest and after stimulation(short nipple shafts, compressible areola)  Comfort (Breast/Nipple): Soft / non-tender  Hold (Positioning): Assistance needed to correctly position infant at breast and maintain latch.  LATCH Score: 9  Interventions Interventions: Breast feeding basics reviewed;Assisted with latch;Skin to skin;Breast massage;Hand express;Breast compression;Adjust position;Support pillows;Position options;Expressed milk;DEBP  Lactation Tools Discussed/Used Tools: Nipple Dorris Carnes;Pump Nipple shield size: 20 Breast pump type: Double-Electric Breast Pump   Consult Status Consult Status: Follow-up Date: 06/28/19 Follow-up type: In-patient    [name] 06/27/2019, 3:31 PM

## 2019-06-27 NOTE — Telephone Encounter (Signed)
Pt's OBGYN office called requesting treatment be prescribed by UC physician. Spoke with Dr Leonides Grills, VO rec'd for Flagyl 500mg  BID x 7 days. Order placed. Patient notified.

## 2019-06-29 ENCOUNTER — Ambulatory Visit: Payer: Self-pay

## 2019-06-29 NOTE — Lactation Note (Signed)
This note was copied from a baby's chart. Lactation Consultation Note  Patient Name: Kelli Mcpherson [id number] Date: 06/29/2019 Reason for consult: Follow-up assessment;Primapara;1st time breastfeeding;NICU baby;Late-preterm 34-36.6wks  LC in to assist/assess with positioning and latching baby to the breast.  Mom last pumped 3 hrs ago.  Reviewed proper placement of 20 mm nipple shield.  Nipple pulled about 1/3 into shield.  Mom has difficulty due to her long nails.  Suggested she may need to remove them to make applying shield easier.  Baby positioned in football hold on right breast.  Baby sounding congested in nose and mouth with increased secretions.  Baby a little fussy at this feeding.  Reminded Mom of hand placement on baby's head and support needed on breast.  Baby not opening his mouth wide.  Encouraged Mom to tap nipple to baby's upper lip and wait for a wide gape of his mouth.  Baby latched but was somewhat disorganized.  Baby's lower lip was tucked at times.  After about 10 mins, he became more nutritive and some swallowing identified.  Mom holding her breast firmly.  Baby fed for about 10 mins with a more rhythmic suck/swallow.    Assisted Mom to burp baby and place him STS on her chest.   Encouraged Mom to double pump after when baby is sleeping in crib.   Feeding Feeding Type: Breast Fed  20 mins on and off, with 10 mins nutritive  LATCH Score Latch: Repeated attempts needed to sustain latch, nipple held in mouth throughout feeding, stimulation needed to elicit sucking reflex.  Audible Swallowing: A few with stimulation  Type of Nipple: Everted at rest and after stimulation  Comfort (Breast/Nipple): Soft / non-tender  Hold (Positioning): Assistance needed to correctly position infant at breast and maintain latch.  LATCH Score: 7  Interventions Interventions: Breast feeding basics reviewed;Assisted with latch;Skin to skin;Breast massage;Hand express;Breast  compression;Adjust position;Support pillows;Position options;Expressed milk;DEBP  Lactation Tools Discussed/Used Tools: Nipple Shields;Pump;Flanges Nipple shield size: 20 Flange Size: 24 Breast pump type: Double-Electric Breast Pump   Consult Status Consult Status: Follow-up Date: 06/30/19 Follow-up type: In-patient    [name] 06/29/2019, 12:47 PM

## 2019-07-01 ENCOUNTER — Ambulatory Visit: Payer: Self-pay

## 2019-07-01 NOTE — Lactation Note (Addendum)
This note was copied from a baby's chart. Lactation Consultation Note  Patient Name: Kelli Mcpherson [id number] Date: 07/01/2019 Reason for consult: Initial assessment;Primapara;NICU baby   Maternal Data Formula Feeding for Exclusion: No Does the patient have breastfeeding experience prior to this delivery?: No Mom to pump breasts after feeding, states she pumps 2-3 oz total from both breasts  every 3 hrs  Feeding Feeding Type: Breast Fed Mom was breastfeeding with 29mm nipple shield at [facility] hospital, once 20 mm nipple shield placed, baby was able to latch after a few attempts in football hold on left breast, sucked occ at first then began audible swallowing and coordinating sucking in bursts with rest periods, then just held nipple in mouth and would not suck with stimulation, nursed approx 8 min.  LATCH Score Latch: Repeated attempts needed to sustain latch, nipple held in mouth throughout feeding, stimulation needed to elicit sucking reflex.  Audible Swallowing: Spontaneous and intermittent  Type of Nipple: Everted at rest and after stimulation  Comfort (Breast/Nipple): Soft / non-tender  Hold (Positioning): Assistance needed to correctly position infant at breast and maintain latch.  LATCH Score: 8  Interventions Interventions: Breast feeding basics reviewed;Assisted with latch;Skin to skin;Breast massage;Support pillows;Position options  Lactation Tools Discussed/Used Tools: Nipple Shields Nipple shield size: 20 WIC Program: Yes   Consult Status Consult Status: Follow-up Date: 07/02/19 Follow-up type: In-patient    Dyann Kief 07/01/2019, 2:17 PM

## 2019-07-02 ENCOUNTER — Ambulatory Visit: Payer: Self-pay

## 2019-07-02 NOTE — Lactation Note (Signed)
This note was copied from a baby's chart. Lactation Consultation Note  Patient Name: Kelli Mcpherson Date: 07/02/2019 Reason for consult: Follow-up assessment;Mother's request;Difficult latch;Primapara;NICU baby;Preterm <34wks;Infant < 6lbs  Baby was fussy initially and refusing to latch.  After he calmed, he latched with #20 nipple shield and began strong rhythmic sucking with occasional swallows.  After he fed with nipple shield, he took a bottle.  Mom is increasing her frequency of pumping to try to keep up with baby's demands.  Mom is still using Symphony from [facility], but also has her own personal pump (Motif) through her insurance. Maternal Data Formula Feeding for Exclusion: No Has patient been taught Hand Expression?: Yes Does the patient have breastfeeding experience prior to this delivery?: No (P1)  Feeding Feeding Type: Bottle Fed - Breast Milk Nipple Type: Slow - flow  LATCH Score Latch: Repeated attempts needed to sustain latch, nipple held in mouth throughout feeding, stimulation needed to elicit sucking reflex.  Audible Swallowing: A few with stimulation  Type of Nipple: Everted at rest and after stimulation  Comfort (Breast/Nipple): Soft / non-tender  Hold (Positioning): Assistance needed to correctly position infant at breast and maintain latch.  LATCH Score: 7  Interventions Interventions: Breast feeding basics reviewed;Assisted with latch;Skin to skin;Breast massage;Hand express;Reverse pressure;Breast compression;Position options;DEBP  Lactation Tools Discussed/Used Tools: Nipple Shields;25F feeding tube / Syringe;Bottle Nipple shield size: 20 Flange Size: 24 Breast pump type: Double-Electric Breast Pump WIC Program: Yes Pump Review: Setup, frequency, and cleaning;Milk Storage;Other (comment) Initiated by:: Riley Kill at [facility] in [place] Date initiated:: 06/09/19   Consult Status Consult Status: PRN Follow-up type: Call as  needed    Louis Meckel 07/02/2019, 3:45 PM

## 2019-07-03 ENCOUNTER — Ambulatory Visit: Payer: Self-pay

## 2019-07-03 NOTE — Lactation Note (Signed)
This note was copied from a baby's chart. Lactation Consultation Note  Patient Name: Kelli Mcpherson [id number] Date: 07/03/2019   Mom attempted to put Kelli Mcpherson to the breast using #20 nipple shield.  Mom could easily drip mature milk when hand expressed, but he was sleepy and would not latch.  Praised mom and encouraged her to continue to pump every 2 to 3 hours to ensure a plentiful milk supply and prevent engorgement.  Encouraged mom to call lactation with any questions, concerns or whenever assistance needed.  Maternal Data    Feeding Feeding Type: Bottle Fed - Breast Milk Nipple Type: Slow - flow  LATCH Score                   Interventions    Lactation Tools Discussed/Used     Consult Status      Louis Meckel 07/03/2019, 10:25 PM

## 2019-07-25 ENCOUNTER — Inpatient Hospital Stay (HOSPITAL_COMMUNITY): Admit: 2019-07-25 | Payer: Self-pay

## 2019-09-07 DIAGNOSIS — Z6841 Body Mass Index (BMI) 40.0 and over, adult: Secondary | ICD-10-CM | POA: Insufficient documentation

## 2020-02-03 DIAGNOSIS — Z20822 Contact with and (suspected) exposure to covid-19: Secondary | ICD-10-CM | POA: Diagnosis not present

## 2020-04-07 ENCOUNTER — Encounter (HOSPITAL_COMMUNITY): Payer: Self-pay | Admitting: Emergency Medicine

## 2020-04-07 ENCOUNTER — Encounter (HOSPITAL_COMMUNITY): Payer: Self-pay

## 2020-04-07 ENCOUNTER — Other Ambulatory Visit: Payer: Self-pay

## 2020-04-07 ENCOUNTER — Ambulatory Visit (HOSPITAL_COMMUNITY)
Admission: EM | Admit: 2020-04-07 | Discharge: 2020-04-07 | Disposition: A | Discharge status: Home / Self Care | Payer: Medicaid Other | Attending: Emergency Medicine | Admitting: Emergency Medicine

## 2020-04-07 DIAGNOSIS — E119 Type 2 diabetes mellitus without complications: Secondary | ICD-10-CM | POA: Diagnosis present

## 2020-04-07 DIAGNOSIS — N760 Acute vaginitis: Secondary | ICD-10-CM | POA: Insufficient documentation

## 2020-04-07 LAB — BASIC METABOLIC PANEL
Anion gap: 8 (ref 5–15)
BUN: 7 mg/dL (ref 6–20)
CO2: 25 mmol/L (ref 22–32)
Calcium: 9.3 mg/dL (ref 8.9–10.3)
Chloride: 102 mmol/L (ref 98–111)
Creatinine, Ser: 0.82 mg/dL (ref 0.44–1.00)
GFR, Estimated: >60 mL/min (ref >60)
Glucose, Bld: 277 mg/dL — ABNORMAL HIGH (ref 70–99)
Potassium: 4.2 mmol/L (ref 3.5–5.1)
Sodium: 135 mmol/L (ref 135–145)

## 2020-04-07 LAB — POCT URINALYSIS DIPSTICK, ED / UC
Bilirubin Urine: NEGATIVE
Glucose, UA: >=1000 mg/dL — AB
Leukocytes,Ua: NEGATIVE
Nitrite: NEGATIVE
Protein, ur: NEGATIVE mg/dL
Specific Gravity, Urine: >=1.03 (ref 1.005–1.030)
Urobilinogen, UA: 0.2 mg/dL (ref 0.0–1.0)
pH: 5 (ref 5.0–8.0)

## 2020-04-07 LAB — CBG MONITORING, ED: Glucose-Capillary: 270 mg/dL — ABNORMAL HIGH (ref 70–99)

## 2020-04-07 LAB — HEMOGLOBIN A1C
Hgb A1c MFr Bld: 10.4 % — ABNORMAL HIGH (ref 4.8–5.6)
Mean Plasma Glucose: 251.78 mg/dL

## 2020-04-07 LAB — POC URINE PREG, ED: Preg Test, Ur: NEGATIVE

## 2020-04-07 MED ORDER — METRONIDAZOLE 500 MG PO TABS
500.0000 mg | ORAL_TABLET | Freq: Two times a day (BID) | ORAL | 0 refills | Status: AC
Start: 2020-04-07 — End: 2020-04-14

## 2020-04-07 MED ORDER — FREESTYLE SYSTEM KIT
1.0000 | PACK | Freq: Every day | 0 refills | Status: DC
Start: 2020-04-07 — End: 2021-07-03

## 2020-04-07 MED ORDER — FLUCONAZOLE 150 MG PO TABS
150.0000 mg | ORAL_TABLET | Freq: Once | ORAL | 1 refills | Status: AC
Start: 2020-04-07 — End: 2020-04-07

## 2020-04-07 MED ORDER — GLUCOSE BLOOD VI STRP: ORAL_STRIP | 2 refills | Status: DC

## 2020-04-07 MED ORDER — METFORMIN HCL 500 MG PO TABS
500.0000 mg | ORAL_TABLET | Freq: Every day | ORAL | 0 refills | Status: DC
Start: 2020-04-07 — End: 2020-06-27

## 2020-04-07 NOTE — ED Provider Notes (Signed)
HPI  SUBJECTIVE:  Kelli Mcpherson is a 25 y.o. 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  She started Valtrex 500 mg twice daily for 5 days 2 days ago.  She reports worsening itching, dysuria, white discharge starting yesterday.  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  She does not know if she has any genital rash.  She reports low midline crampy abdominal/pelvic pain for the past 4 days.  It lasts minutes and then resolves.  She reports urinary urgency, frequency, cloudy urine.  No nausea no vomiting, fevers, other abdominal, back pain.  No vaginal bleeding, odor.  No odorous urine, hematuria.  She is in a long-term monogamous relationship with a female who is asymptomatic.  She would like to be tested for STDs.  She has tried Valtrex and pushing fluids.  No alleviating factors.  Symptoms are worse with urination.  No recent antibiotics.  No antipyretic in the past 6 hours. Patient has a past medical history of gonorrhea, chlamydia, HSV-2, BV, gestational diabetes, DKA, pregnancy-induced hypertension, COVID.  States that diabetes and hypertension have resolved postpartum.  She has a history of UTIs.  No history of HIV, syphilis, PID, pyelonephritis.  LMP: Amenorrheic.  Has an IUD.  Not sure if she could be pregnant.  PMD: [facility]  Patient also mentions that she has been having diarrhea for the past several days as well.   Past Medical History:  Diagnosis Date  . Chlamydia   . COVID-19   . Eczema   . Gestational diabetes   . Gonorrhea   . Pregnancy induced hypertension     Past Surgical History:  Procedure Laterality Date  . LYMPHADENECTOMY    . WISDOM TOOTH EXTRACTION      Family History  Problem Relation Age of Onset  . Diabetes Mother   . Hypertension Mother   . Hypertension Father     Social History   Tobacco Use  . Smoking status: Former Smoker     Types: Cigars  . Smokeless tobacco: Former Systems developer  . Tobacco comment: patient currently does not smoke   Vaping Use  . Vaping Use: Never used  Substance Use Topics  . Alcohol use: Never    Comment: socially  . Drug use: Never    No current facility-administered medications for this encounter.  Current Outpatient Medications:  .  fluconazole (DIFLUCAN) 150 MG tablet, Take 1 tablet (150 mg total) by mouth once for 1 dose. 1 tab po x 1. May repeat in 72 hours if no improvement, Disp: 2 tablet, Rfl: 1 .  glucose blood test strip, Check your sugar in the morning before you eat breakfast, and one hour after a meal., Disp: 100 each, Rfl: 2 .  glucose monitoring kit (FREESTYLE) monitoring kit, 1 each by Does not apply route daily. Check glucose once in the morning before breakfast and 1 hour after a meal, Disp: 1 each, Rfl: 0 .  metFORMIN (GLUCOPHAGE) 500 MG tablet, Take 1 tablet (500 mg total) by mouth daily with breakfast. 1 tab po in the a.m. for 1 week, 1 tab twice a day for 1 week, then 1 tab in am and 2 tabs at night for the next week, Disp: 42 tablet, Rfl: 0 .  metroNIDAZOLE (FLAGYL) 500 MG tablet, Take 1 tablet (500 mg total) by mouth 2 (two) times daily for 7  days., Disp: 14 tablet, Rfl: 0 .  acetaminophen (TYLENOL) 500 MG tablet, Take 500 mg by mouth every 6 (six) hours as needed., Disp: , Rfl:  .  enalapril (VASOTEC) 10 MG tablet, Take 1 tablet (10 mg total) by mouth daily., Disp: 30 tablet, Rfl: 0 .  furosemide (LASIX) 20 MG tablet, Take 1 tablet (20 mg total) by mouth daily for 10 days., Disp: 10 tablet, Rfl: 0 .  ibuprofen (ADVIL) 600 MG tablet, Take 1 tablet (600 mg total) by mouth every 6 (six) hours., Disp: 30 tablet, Rfl: 0 .  Prenatal Vit-Fe Phos-FA-Omega (VITAFOL GUMMIES) 3.33-0.333-34.8 MG CHEW, Chew 3 each by mouth daily., Disp: , Rfl:   No Known Allergies   ROS  As noted in HPI.   Physical Exam  BP 140/86 (BP Location: Left Arm)   Pulse 87   Temp 99.1 F (37.3 C)  (Oral)   Resp 18   SpO2 100%   Constitutional: Well developed, well nourished, no acute distress Eyes:  EOMI, conjunctiva normal bilaterally HENT: Normocephalic, atraumatic,mucus membranes moist Respiratory: Normal inspiratory effort Cardiovascular: Normal rate GI: nondistended soft, nontender. No suprapubic, flank tenderness  back: No CVA tenderness GU: External genitalia normal.  No appreciable ulcers.  White-yellowish vaginal discharge externally.  Normal vaginal mucosa.  Normal os.  Extensive brownish white-yellow vaginal discharge.  Uterus smooth, NT. No  CMT. No adnexal tenderness. No adnexal masses.  Chaperone present during exam skin: No rash, skin intact Musculoskeletal: no deformities Neurologic: Alert & oriented x 3, no focal neuro deficits Psychiatric: Speech and behavior appropriate   ED Course   Medications - No data to display  Orders Placed This Encounter  Procedures  . Basic metabolic panel    Standing Status:   Standing    Number of Occurrences:   1  . Hemoglobin A1c    Standing Status:   Standing    Number of Occurrences:   1  . POC Urinalysis dipstick    Standing Status:   Standing    Number of Occurrences:   1  . POC urine pregnancy    Standing Status:   Standing    Number of Occurrences:   1  . POC CBG monitoring    Standing Status:   Standing    Number of Occurrences:   1    Results for orders placed or performed during the hospital encounter of 04/07/20 (from the past 24 hour(s))  POC Urinalysis dipstick     Status: Abnormal   Collection Time: 04/07/20 11:16 AM  Result Value Ref Range   Glucose, UA >=1000 (A) NEGATIVE mg/dL   Bilirubin Urine NEGATIVE NEGATIVE   Ketones, ur TRACE (A) NEGATIVE mg/dL   Specific Gravity, Urine >=1.030 1.005 - 1.030   Hgb urine dipstick TRACE (A) NEGATIVE   pH 5.0 5.0 - 8.0   Protein, ur NEGATIVE NEGATIVE mg/dL   Urobilinogen, UA 0.2 0.0 - 1.0 mg/dL   Nitrite NEGATIVE NEGATIVE   Leukocytes,Ua NEGATIVE NEGATIVE   POC urine pregnancy     Status: None   Collection Time: 04/07/20 11:18 AM  Result Value Ref Range   Preg Test, Ur NEGATIVE NEGATIVE  POC CBG monitoring     Status: Abnormal   Collection Time: 04/07/20 11:20 AM  Result Value Ref Range   Glucose-Capillary 270 (H) 70 - 99 mg/dL  Basic metabolic panel     Status: Abnormal   Collection Time: 04/07/20 11:27 AM  Result Value Ref Range   Sodium 135 135 -  145 mmol/L   Potassium 4.2 3.5 - 5.1 mmol/L   Chloride 102 98 - 111 mmol/L   CO2 25 22 - 32 mmol/L   Glucose, Bld 277 (H) 70 - 99 mg/dL   BUN 7 6 - 20 mg/dL   Creatinine, Ser 0.82 0.44 - 1.00 mg/dL   Calcium 9.3 8.9 - 10.3 mg/dL   GFR, Estimated >60 >60 mL/min   Anion gap 8 5 - 15  Hemoglobin A1c     Status: Abnormal   Collection Time: 04/07/20 11:28 AM  Result Value Ref Range   Hgb A1c MFr Bld 10.4 (H) 4.8 - 5.6 %   Mean Plasma Glucose 251.78 mg/dL   No results found.  ED Clinical Impression  1. Acute vaginitis   2. Type 2 diabetes mellitus without complication, without long-term current use of insulin [facility])     ED Assessment/Plan Previous records, outside lab reviewed.  As noted in HPI.  Checking UA, urine pregnancy because of the urinary symptoms and she is not sure if she could be pregnant.  Patient with ketones and greater than thousand glucose in her urine.  Fingerstick 270.    Last A1c was 5.9 on 09/13/2019.  Will check a BMP for electrolyte/acid-base status, and A1c for her PMD, and send her home on Metformin, glucometer, glucose log.  She will need follow-up with her primary care physician ASAP. ER return precautions given  A1c 10.4.  BMP negative for acidosis.  She has a normal anion gap.  She is not in DKA.  Plan as above.  [phone]   H&P most c/w yeast infection versus BV although the vaginal discharge could be from a herpes outbreak.  Discussed this with patient. Sent off GC/chlamydia, wet prep. Will not treat empirically for gonorrhea or chlamydia now. Will  send home with a wait-and-see prescription of flagyl for possible BV and diflucan for yeast infection.  She has MyChart.  She will start the Flagyl if her BV comes back positive.  She will start the Diflucan today.  Advised pt to refrain from sexual contact until she  knows lab results, symptoms resolve, and partner(s) are treated if necessary. Pt provided working phone number. Follow-up with PMD as needed. Discussed labs, MDM, plan and followup with patient. Pt agrees with plan.   Sent patient my chart note regarding A1c and BMP.  Again emphasized plan.  Meds ordered this encounter  Medications  . metroNIDAZOLE (FLAGYL) 500 MG tablet    Sig: Take 1 tablet (500 mg total) by mouth 2 (two) times daily for 7 days.    Dispense:  14 tablet    Refill:  0  . fluconazole (DIFLUCAN) 150 MG tablet    Sig: Take 1 tablet (150 mg total) by mouth once for 1 dose. 1 tab po x 1. May repeat in 72 hours if no improvement    Dispense:  2 tablet    Refill:  1  . glucose monitoring kit (FREESTYLE) monitoring kit    Sig: 1 each by Does not apply route daily. Check glucose once in the morning before breakfast and 1 hour after a meal    Dispense:  1 each    Refill:  0  . glucose blood test strip    Sig: Check your sugar in the morning before you eat breakfast, and one hour after a meal.    Dispense:  100 each    Refill:  2  . metFORMIN (GLUCOPHAGE) 500 MG tablet    Sig: Take  1 tablet (500 mg total) by mouth daily with breakfast. 1 tab po in the a.m. for 1 week, 1 tab twice a day for 1 week, then 1 tab in am and 2 tabs at night for the next week    Dispense:  42 tablet    Refill:  0    *This clinic note was created using [employer]. Therefore, there may be occasional mistakes despite careful proofreading.  ?     Melynda Ripple, MD 04/07/20 1259

## 2020-04-07 NOTE — Discharge Instructions (Addendum)
Urinalysis was negative for urinary tract infection.  You are not pregnant.  I suspect the discharge is coming from a herpes outbreak, but it could also be a yeast infection or BV.  You have a lot of sugar in your urine, so your diabetes has not resolved.  You can get yeast infections very frequently from uncontrolled diabetes.  I am checking some more labs and an A1c to make sure that you are not in DKA.  I will contact you if you are in DKA.  I am sending you home with Metformin to try and get your sugars under control and a glucometer.  Check your sugar once in the morning before eating in 1 hour after eating dinner.  Keep a log of this.  Follow-up with your primary care physician ASAP for further management.  Give me 3 weeks worth of Metformin.  Bring your glucometer and log of your blood sugar with you to that appointment.  I am sending you home with Flagyl for BV and Diflucan for a yeast infection. Take the medication as written.  Can take 600 mg of ibuprofen combined with 1000 mg of Tylenol together 3-4 times a day as needed for pain.  Continue the Valtrex.  Give [place] a working phone number so that we can contact you if needed. Refrain from sexual contact until you know your results and your partner(s) are treated if necessary. Return to the ER if you get worse, have a fever >100.4, or for any concerns.   Go to www.goodrx.com to look up your medications. This will give you a list of where you can find your prescriptions at the most affordable prices. Or ask the pharmacist what the cash price is, or if they have any other discount programs available to help make your medication more affordable. This can be less expensive than what you would pay with insurance.

## 2020-04-07 NOTE — ED Triage Notes (Signed)
Pt present vaginal irritation with itching and burning. Symptom started yesterday.  Pt states her discharges is white and does not have a odor.

## 2020-04-09 LAB — CERVICOVAGINAL ANCILLARY ONLY
Bacterial Vaginitis (gardnerella): NEGATIVE
Candida Glabrata: NEGATIVE
Candida Vaginitis: POSITIVE — AB
Chlamydia: NEGATIVE
Comment: NEGATIVE
Comment: NEGATIVE
Comment: NEGATIVE
Comment: NEGATIVE
Comment: NEGATIVE
Comment: NORMAL
Neisseria Gonorrhea: NEGATIVE
Trichomonas: NEGATIVE

## 2020-05-09 ENCOUNTER — Encounter (HOSPITAL_COMMUNITY): Payer: Self-pay

## 2020-05-09 ENCOUNTER — Other Ambulatory Visit: Payer: Self-pay

## 2020-05-09 ENCOUNTER — Ambulatory Visit (HOSPITAL_COMMUNITY)
Admission: EM | Admit: 2020-05-09 | Discharge: 2020-05-09 | Disposition: A | Discharge status: Home / Self Care | Payer: Medicaid Other | Attending: Family Medicine | Admitting: Family Medicine

## 2020-05-09 DIAGNOSIS — N898 Other specified noninflammatory disorders of vagina: Secondary | ICD-10-CM | POA: Diagnosis not present

## 2020-05-09 DIAGNOSIS — E0865 Diabetes mellitus due to underlying condition with hyperglycemia: Secondary | ICD-10-CM | POA: Diagnosis not present

## 2020-05-09 DIAGNOSIS — I10 Essential (primary) hypertension: Secondary | ICD-10-CM

## 2020-05-09 DIAGNOSIS — M545 Low back pain, unspecified: Secondary | ICD-10-CM | POA: Diagnosis not present

## 2020-05-09 DIAGNOSIS — M62830 Muscle spasm of back: Secondary | ICD-10-CM

## 2020-05-09 LAB — POCT URINALYSIS DIPSTICK, ED / UC
Bilirubin Urine: NEGATIVE
Glucose, UA: >=1000 mg/dL — AB
Leukocytes,Ua: NEGATIVE
Nitrite: NEGATIVE
Protein, ur: 30 mg/dL — AB
Specific Gravity, Urine: >=1.03 (ref 1.005–1.030)
Urobilinogen, UA: 0.2 mg/dL (ref 0.0–1.0)
pH: 5 (ref 5.0–8.0)

## 2020-05-09 LAB — CBG MONITORING, ED: Glucose-Capillary: 205 mg/dL — ABNORMAL HIGH (ref 70–99)

## 2020-05-09 LAB — POC URINE PREG, ED: Preg Test, Ur: NEGATIVE

## 2020-05-09 MED ORDER — CYCLOBENZAPRINE HCL 10 MG PO TABS
ORAL_TABLET | ORAL | 0 refills | Status: DC
Start: 2020-05-09 — End: 2020-06-27

## 2020-05-09 MED ORDER — FLUCONAZOLE 150 MG PO TABS
ORAL_TABLET | ORAL | 0 refills | Status: DC
Start: 2020-05-09 — End: 2020-06-27

## 2020-05-09 MED ORDER — DICLOFENAC SODIUM 75 MG PO TBEC
75.0000 mg | DELAYED_RELEASE_TABLET | Freq: Two times a day (BID) | ORAL | 0 refills | Status: DC
Start: 2020-05-09 — End: 2020-06-27

## 2020-05-09 NOTE — ED Triage Notes (Signed)
Pt presents with left flank pain X 1400 this afternoon. Pt states it is a constant pain and states it feels like a stabbing/cramping pain. Pt states she has been having vaginal bleeding x 3 weeks and states the bleeding has not been heavy.

## 2020-05-09 NOTE — Discharge Instructions (Signed)

## 2020-05-12 NOTE — ED Provider Notes (Signed)
[facility]   [id number] 05/09/20 Arrival Time: 1844  ASSESSMENT & PLAN:  1. Acute left-sided low back pain without sciatica   2. Diabetes mellitus due to underlying condition with hyperglycemia, without long-term current use of insulin (HCC)   3. Spasm of muscle of lower back   4. Vaginal itching   5. Elevated blood pressure reading in office with diagnosis of hypertension     UPT negative. U/A without signs of infection. LBP likely MSK. No evidence of kidney involvement.  Able to ambulate here and hemodynamically stable. No indication for imaging of back at this time given no trauma and normal neurological exam. Discussed. Requests Rx diflucan as she feels she has a vaginal yeast infections; denies vaginal cytology.  CBG elevated here. No s/s of DKA. She will recheck at home. Taking meds as directed. May f/u here to recheck BP PCP not available. Discussed.  Begin: Meds ordered this encounter  Medications  . cyclobenzaprine (FLEXERIL) 10 MG tablet    Sig: Take 1 tablet by mouth 3 times daily as needed for muscle spasm. Warning: May cause drowsiness.    Dispense:  21 tablet    Refill:  0  . diclofenac (VOLTAREN) 75 MG EC tablet    Sig: Take 1 tablet (75 mg total) by mouth 2 (two) times daily.    Dispense:  14 tablet    Refill:  0  . fluconazole (DIFLUCAN) 150 MG tablet    Sig: Take one tablet by mouth as a single dose. May repeat in 3 days if symptoms persist.    Dispense:  2 tablet    Refill:  0   Encourage ROM/movement as tolerated.  Recommend:  Follow-up Information    Elon Spanner Madelaine Etienne, MD.   Specialty: Obstetrics and Gynecology Why: As needed. Contact information: [address] [place] [address] [phone]        [facility] [place].   Specialty: Urgent Care Why: If worsening or failing to improve as anticipated. Contact information: [address] [place] [address] [phone]                Discharge Instructions     HOME CARE INSTRUCTIONS: For many people, back pain returns. Since low back pain is rarely dangerous, it is often a condition that people can learn to manage on their own. Please remain active. It is stressful on the back to sit or stand in one place. Do not sit, drive, or stand in one place for more than 30 minutes at a time. Take short walks on level surfaces as soon as pain allows. Try to increase the length of time you walk each day. Do not stay in bed. Resting more than 1 or 2 days can delay your recovery. Do not avoid exercise or work. Your body is made to move. It is not dangerous to be active, even though your back may hurt. Your back will likely heal faster if you return to being active before your pain is gone. Over-the-counter medicines to reduce pain and inflammation are often the most helpful.  SEEK MEDICAL CARE IF: You have pain that is not relieved with rest or medicine. You have pain that does not improve in 1 week. You have new symptoms. You are generally not feeling well.  SEEK IMMEDIATE MEDICAL CARE IF: You have pain that radiates from your back into your legs. You develop new bowel or bladder control problems. You have unusual weakness or  numbness in your arms or legs. You develop nausea or vomiting. You develop abdominal pain. You feel faint.      Reviewed expectations re: course of current medical issues. Questions answered. Outlined signs and symptoms indicating need for more acute intervention. Patient verbalized understanding. After Visit Summary given.   SUBJECTIVE: History from: patient.  Kelli Mcpherson is a 25 y.o. female who presents with complaint of L lower back/flank pain; abrupt onset today; sev hours ago. No injury/trauma. "Really sore, especially if I move around". Better when sitting still. No extremity sensation changes or weakness. Normal bowel/bladder habits recently. Afebrile. Normal PO intake  without n/v/d. Does reports heavy period; bleeding on/off for 2 weeks; h/o similar. No abd pain. Ambulatory without difficulty.   Also reports vaginal itching; over past week; heavy period; no specific discharge. Requests yeast tx. Not concerned re: STD.  Increased blood pressure noted today. Reports that she is treated for HTN. She reports no chest pain on exertion, no dyspnea on exertion, no swelling of ankles, no orthostatic dizziness or lightheadedness, no orthopnea or paroxysmal nocturnal dyspnea and no palpitations.   OBJECTIVE:  Vitals:   05/09/20 1855  BP: (!) 152/87  Pulse: 91  Resp: 18  Temp: 98.3 F (36.8 C)  TempSrc: Oral  SpO2: 98%    General appearance: alert; no distress HEENT: [place]; AT Neck: supple with FROM; without midline tenderness CV: regular Lungs: unlabored respirations; speaks full sentences without difficulty Abdomen: soft, non-tender; non-distended Back: mild  and poorly localized tenderness to palpation over L lower lumbar musculature extending toward flank; FROM at waist; bruising: none; without midline tenderness Extremities: without edema; symmetrical without gross deformities; normal ROM of bilateral LE Skin: warm and dry Neurologic: normal gait; normal sensation and strength of bilateral LE Psychological: alert and cooperative; normal mood and affect  Labs: Labs Reviewed  POCT URINALYSIS DIPSTICK, ED / UC - Abnormal; Notable for the following components:      Result Value   Glucose, UA >=1000 (*)    Ketones, ur TRACE (*)    Hgb urine dipstick MODERATE (*)    Protein, ur 30 (*)    All other components within normal limits  CBG MONITORING, ED - Abnormal; Notable for the following components:   Glucose-Capillary 205 (*)    All other components within normal limits  POC URINE PREG, ED    No Active Allergies  Past Medical History:  Diagnosis Date  . Chlamydia   . COVID-19   . Eczema   . Gestational diabetes   . Gonorrhea   . Pregnancy  induced hypertension    Social History   Socioeconomic History  . Marital status: Single    Spouse name: Not on file  . Number of children: Not on file  . Years of education: Not on file  . Highest education level: Not on file  Occupational History  . Not on file  Tobacco Use  . Smoking status: Former Smoker    Types: Cigars  . Smokeless tobacco: Former Neurosurgeon  . Tobacco comment: patient currently does not smoke   Vaping Use  . Vaping Use: Never used  Substance and Sexual Activity  . Alcohol use: Never    Comment: socially  . Drug use: Never  . Sexual activity: Yes  Other Topics Concern  . Not on file  Social History Narrative  . Not on file   Social Determinants of Health   Financial Resource Strain: Not on file  Food Insecurity: Not on file  Transportation Needs: Not on file  Physical Activity: Not on file  Stress: Not on file  Social Connections: Not on file  Intimate Partner Violence: Not on file   Family History  Problem Relation Age of Onset  . Diabetes Mother   . Hypertension Mother   . Hypertension Father    Past Surgical History:  Procedure Laterality Date  . LYMPHADENECTOMY    . WISDOM TOOTH EXTRACTION       Mardella Layman, MD 05/12/20 [phone]

## 2020-06-27 ENCOUNTER — Other Ambulatory Visit: Payer: Self-pay

## 2020-06-27 ENCOUNTER — Encounter (HOSPITAL_COMMUNITY): Payer: Self-pay | Admitting: Emergency Medicine

## 2020-06-27 ENCOUNTER — Ambulatory Visit (HOSPITAL_COMMUNITY)
Admission: EM | Admit: 2020-06-27 | Discharge: 2020-06-27 | Disposition: A | Discharge status: Home / Self Care | Payer: Medicaid Other | Attending: Family | Admitting: Family

## 2020-06-27 DIAGNOSIS — R030 Elevated blood-pressure reading, without diagnosis of hypertension: Secondary | ICD-10-CM

## 2020-06-27 DIAGNOSIS — R7309 Other abnormal glucose: Secondary | ICD-10-CM | POA: Diagnosis not present

## 2020-06-27 DIAGNOSIS — N939 Abnormal uterine and vaginal bleeding, unspecified: Secondary | ICD-10-CM | POA: Diagnosis not present

## 2020-06-27 DIAGNOSIS — N898 Other specified noninflammatory disorders of vagina: Secondary | ICD-10-CM | POA: Insufficient documentation

## 2020-06-27 DIAGNOSIS — N921 Excessive and frequent menstruation with irregular cycle: Secondary | ICD-10-CM | POA: Diagnosis not present

## 2020-06-27 DIAGNOSIS — Z975 Presence of (intrauterine) contraceptive device: Secondary | ICD-10-CM | POA: Diagnosis not present

## 2020-06-27 LAB — POCT URINALYSIS DIPSTICK, ED / UC
Bilirubin Urine: NEGATIVE
Glucose, UA: 500 mg/dL — AB
Hgb urine dipstick: NEGATIVE
Ketones, ur: NEGATIVE mg/dL
Leukocytes,Ua: NEGATIVE
Nitrite: NEGATIVE
Protein, ur: NEGATIVE mg/dL
Specific Gravity, Urine: >=1.03 (ref 1.005–1.030)
Urobilinogen, UA: 0.2 mg/dL (ref 0.0–1.0)
pH: 5.5 (ref 5.0–8.0)

## 2020-06-27 LAB — CBG MONITORING, ED: Glucose-Capillary: 168 mg/dL — ABNORMAL HIGH (ref 70–99)

## 2020-06-27 LAB — POC URINE PREG, ED: Preg Test, Ur: NEGATIVE

## 2020-06-27 MED ORDER — FLUCONAZOLE 150 MG PO TABS: ORAL_TABLET | ORAL | 0 refills | Status: DC

## 2020-06-27 MED ORDER — METFORMIN HCL 500 MG PO TABS: 500.0000 mg | ORAL_TABLET | Freq: Two times a day (BID) | ORAL | 1 refills | Status: DC

## 2020-06-27 NOTE — Discharge Instructions (Addendum)
Recommend start Diflucan 150mg  once daily for 3 days. Restart Metformin (glucophage) 500mg  twice a day. You will need to see a Primary Care provider for continued monitoring of your sugar level and for medication. If you are not able to see a PCP before you run out of medication, return to Urgent Care. Also recommend call [facility] (Dr. is one of the providers) today to schedule appointment for them to check your IUD placement and for recurrent vaginal symptoms. Follow-up pending lab results.

## 2020-06-27 NOTE — ED Provider Notes (Signed)
[place]    CSN: [id number] Arrival date & time: 06/27/20  1139      History   Chief Complaint Chief Complaint  Patient presents with   Vaginal Bleeding    Vaginal irritation    HPI Kelli Mcpherson is a 25 y.o. female.   25 year old female presents with irregular vaginal bleeding/spotting and vaginal irritation. She has been spotting on and off for about 1 year since she had the Mirena IUD placed in July 2021 after the birth of her son in May 2021. Her last IUD check was August 2021 and the IUD was in place but she was having intercourse about 3 weeks ago and had to stop due to pain. She was planning to see her previous OB/GYN as well as her Family PCP but her insurance changed and they do not accept Medicaid. She has also had vaginal irritation and itching for the past few days. Has history of genital herpes and recurrent vaginal yeast infections. Has history of Gestational diabetes and when evaluated here at Urgent Care for vaginitis in March 2022 her random glucose level was 270 and HgbAlc was 10.4. She was notified that she appears to have type 2 DM. She was started on Metformin 553m in AM and 10076min PM. She took the pills for about 3 weeks and then ran out and did not contact anyone about continuing medication. She does not have a PCP yet. She returned to Urgent Care for evaluation of back pain about 1 month later in April 2022 and repeat random glucose level had decreased to 205. Blood pressure was also elevated at that time but patient indicated she was being "treated". She was prescribed additional Diflucan at that visit due to continued vaginal irritation and itching. She denies any fever, abdominal pain or back pain now. She did not know where she can go to be seen for Women's health issues. She thought [facility] was only for pregnant patients. No current smoking, alcohol or illicit drug use. No other chronic health issues.   The history is provided by  the patient.   Past Medical History:  Diagnosis Date   Chlamydia    COVID-19    Eczema    Gestational diabetes    Gonorrhea    Pregnancy induced hypertension     Patient Active Problem List   Diagnosis Date Noted   Pre-eclampsia 06/08/2019   COVID-19 affecting pregnancy in third trimester 05/29/2019   [place]   Vaginal discharge 02/04/2018   Costochondral chest pain 10/13/2017   Episodic tension-type headache, not intractable 10/13/2017   Obesity 08/01/2015    Past Surgical History:  Procedure Laterality Date   LYMPHADENECTOMY     WISDOM TOOTH EXTRACTION      OB History     Gravida  1   Para  1   Term      Preterm  1   AB      Living  1      SAB      IAB      Ectopic      Multiple  0   Live Births  1            Home Medications    Prior to Admission medications   Medication Sig Start Date End Date Taking? Authorizing Provider  metFORMIN (GLUCOPHAGE) 500 MG tablet Take 1 tablet (500 mg total) by mouth 2 (two) times daily with a meal. 06/27/20  Yes Texanna Hilburn, Nicholes Stairs, NP  acetaminophen (TYLENOL) 500 MG tablet Take 500 mg by mouth every 6 (six) hours as needed.    [provider]  fluconazole (DIFLUCAN) 150 MG tablet Take one tablet by mouth daily for 3 days. 06/27/20   Katy Apo, NP  furosemide (LASIX) 20 MG tablet Take 1 tablet (20 mg total) by mouth daily for 10 days. 06/20/19 06/30/19  Nugent, Gerrie Nordmann, NP  glucose blood test strip Check your sugar in the morning before you eat breakfast, and one hour after a meal. 04/07/20   Melynda Ripple, MD  glucose monitoring kit (FREESTYLE) monitoring kit 1 each by Does not apply route daily. Check glucose once in the morning before breakfast and 1 hour after a meal 04/07/20   Melynda Ripple, MD  enalapril (VASOTEC) 10 MG tablet Take 1 tablet (10 mg total) by mouth daily. 06/20/19 06/27/20  Nugent, Gerrie Nordmann, NP    Family History Family History  Problem  Relation Age of Onset   Diabetes Mother    Hypertension Mother    Hypertension Father     Social History Social History   Tobacco Use   Smoking status: Former    Pack years: 0.00    Types: Cigars   Smokeless tobacco: Former   Tobacco comments:    patient currently does not smoke   Scientific laboratory technician Use: Never used  Substance Use Topics   Alcohol use: Never    Comment: socially   Drug use: Never     Allergies   Patient has no known allergies.   Review of Systems Review of Systems  Constitutional:  Negative for activity change, appetite change, chills, fatigue and fever.  HENT:  Negative for mouth sores, sore throat and trouble swallowing.   Respiratory:  Negative for cough, chest tightness, shortness of breath and wheezing.   Gastrointestinal:  Negative for abdominal pain, nausea and vomiting.  Endocrine: Positive for polyuria.  Genitourinary:  Positive for frequency, menstrual problem, vaginal bleeding, vaginal discharge and vaginal pain. Negative for decreased urine volume, difficulty urinating, dysuria, flank pain, hematuria, pelvic pain and urgency.  Musculoskeletal:  Negative for arthralgias, back pain (resolved) and myalgias.  Skin:  Negative for color change and rash.  Allergic/Immunologic: Negative for environmental allergies, food allergies and immunocompromised state.  Neurological:  Positive for headaches. Negative for dizziness, tremors, seizures, syncope, speech difficulty, weakness, light-headedness and numbness.  Hematological:  Negative for adenopathy. Does not bruise/bleed easily.    Physical Exam Triage Vital Signs ED Triage Vitals  Enc Vitals Group     BP 06/27/20 1149 (!) 139/92     Pulse Rate 06/27/20 1149 82     Resp 06/27/20 1149 18     Temp 06/27/20 1152 99.1 F (37.3 C)     Temp Source 06/27/20 1148 Oral     SpO2 06/27/20 1149 96 %     Weight --      Height --      Head Circumference --      Peak Flow --      Pain Score 06/27/20  1150 0     Pain Loc --      Pain Edu? --      Excl. in [place]? --    No data found.  Updated Vital Signs BP (!) 139/92   Pulse 82   Temp 99.1 F (37.3 C)   Resp 18   SpO2 96%   Breastfeeding No   Visual Acuity Right  Eye Distance:   Left Eye Distance:   Bilateral Distance:    Right Eye Near:   Left Eye Near:    Bilateral Near:     Physical Exam Vitals and nursing note reviewed. Exam conducted with a chaperone present.  Constitutional:      General: She is awake. She is not in acute distress.    Appearance: She is well-developed and well-groomed. She is obese.     Comments: She is sitting on the exam table in no acute distress.   HENT:     Head: Normocephalic and atraumatic.     Right Ear: Hearing normal.     Left Ear: Hearing normal.  Eyes:     Extraocular Movements: Extraocular movements intact.     Conjunctiva/sclera: Conjunctivae normal.  Cardiovascular:     Rate and Rhythm: Normal rate and regular rhythm.     Heart sounds: Normal heart sounds. No murmur heard. Pulmonary:     Effort: Pulmonary effort is normal. No prolonged expiration or respiratory distress.     Breath sounds: Normal air entry. No decreased air movement. No decreased breath sounds, wheezing or rhonchi.  Abdominal:     General: Bowel sounds are normal. There is no distension.     Palpations: Abdomen is soft.     Tenderness: There is no abdominal tenderness. There is no right CVA tenderness, left CVA tenderness, guarding or rebound.  Genitourinary:    Exam position: Lithotomy position.     Pubic Area: No rash.      Labia:        Right: Rash present.        Left: Rash present.      Urethra: No urethral swelling or urethral lesion.     Vagina: Vaginal discharge (white to yellow) and erythema present.     Cervix: Discharge, erythema and cervical bleeding (slight) present. No cervical motion tenderness or lesion.     Uterus: Normal.      Adnexa: Right adnexa normal and left adnexa normal.         Comments: Slight redness and irritation on inner labial area bilaterally. No distinct ulcers present.  Vaginal canal with some redness, irritation and white to yellow vaginal discharge with mucus present. Cervix is irritated with slight bleeding. No distinct lesions. IUD thick threads present and long- about 3cm each. Do not see IUD device coming through cervical os. No tenderness of uterus or adnexa.  Musculoskeletal:        General: Normal range of motion.     Cervical back: Normal range of motion.  Lymphadenopathy:     Lower Body: No right inguinal adenopathy. No left inguinal adenopathy.  Skin:    General: Skin is warm and dry.     Capillary Refill: Capillary refill takes less than 2 seconds.  Neurological:     General: No focal deficit present.     Mental Status: She is alert and oriented to person, place, and time.  Psychiatric:        Attention and Perception: Attention normal.        Mood and Affect: Mood and affect normal.        Speech: Speech normal.        Behavior: Behavior is slowed. Behavior is cooperative.        Thought Content: Thought content normal.     UC Treatments / Results  Labs (all labs ordered are listed, but only abnormal results are displayed) Labs Reviewed  POCT URINALYSIS DIPSTICK, ED /  UC - Abnormal; Notable for the following components:      Result Value   Glucose, UA 500 (*)    All other components within normal limits  CBG MONITORING, ED - Abnormal; Notable for the following components:   Glucose-Capillary 168 (*)    All other components within normal limits  POC URINE PREG, ED  CERVICOVAGINAL ANCILLARY ONLY    EKG   Radiology No results found.  Procedures Procedures (including critical care time)  Medications Ordered in UC Medications - No data to display  Initial Impression / Assessment and Plan / UC Course  I have reviewed the triage vital signs and the nursing notes.  Pertinent labs & imaging results that were available during  my care of the patient were reviewed by me and considered in my medical decision making (see chart for details).    Patient had to leave to take her son to a healthcare appointment. Melissa, RN, reviewed the following information with the patient when she returned later today.  Reviewed urinalysis results - no signs of UTI. Glucose in urine has decreased from over 1000 to 500. Negative urine pregnancy test. Reviewed random glucose level has decreased to 168 but patient needs to restart diabetes medication. Recommend Metformin 564m twice a day with meals as directed #60 with 1 refill provided. Patient needs to make appointment with PCP now for continued diabetic management. [place] [facility] [place] she can not get an appointment within 2 months, return here to Urgent Care for additional Metformin. Blood pressure also elevated today- may need to restart medication for possible HTN. Again- needs to see PCP for further management. Discussed that while glucose level is elevated she will continue to get yeast infections. May start Diflucan 1558monce daily for 3 days for possible current infection- will prescribe additional medication pending STD/vaginitis testing. Discussed long IUD threads/strings with patient which may cause some vaginal discomfort and pain with intercourse. IUD has not been dislodged and is not coming through cervical Os but uncertain of placement- needs to see a GYN for U/S and confirm placement. Recommend call [facility] today to schedule appointment for IUD check and to address recurrent vaginal symptoms. Follow-up pending lab results.   Final Clinical Impressions(s) / UC Diagnoses   Final diagnoses:  Vaginal irritation  Elevated glucose level  Elevated blood pressure reading in office without diagnosis of hypertension  Breakthrough bleeding associated with intrauterine device (IUD)  Complaint of vaginal bleeding     Discharge Instructions       Recommend start Diflucan 15052mnce daily for 3 days. Restart Metformin (glucophage) 500m29mice a day. You will need to see a Primary Care provider for continued monitoring of your sugar level and for medication. If you are not able to see a PCP before you run out of medication, return to Urgent Care. Also recommend call [facility] (Dr. DaviRosana Hoesone of the providers) today to schedule appointment for them to check your IUD placement and for recurrent vaginal symptoms. Follow-up pending lab results.     ED Prescriptions     Medication Sig Dispense Auth. Provider   fluconazole (DIFLUCAN) 150 MG tablet Take one tablet by mouth daily for 3 days. 3 tablet AmyoKaty Apo   metFORMIN (GLUCOPHAGE) 500 MG tablet Take 1 tablet (500 mg total) by mouth 2 (two) times daily with a meal. 60 tablet Zakkary Thibault Nicholes Stairs      PDMP not reviewed this  encounter.   Katy Apo, NP 06/28/20 1328

## 2020-06-27 NOTE — ED Triage Notes (Signed)
Pt is present today with vaginal bleeding and irritation. Pt states that since her IUD placement she has been spotting on and off for a year. Pt states the vaginal irritation started two days ago

## 2020-06-28 LAB — CERVICOVAGINAL ANCILLARY ONLY
Bacterial Vaginitis (gardnerella): NEGATIVE
Candida Glabrata: NEGATIVE
Candida Vaginitis: POSITIVE — AB
Chlamydia: NEGATIVE
Comment: NEGATIVE
Comment: NEGATIVE
Comment: NEGATIVE
Comment: NEGATIVE
Comment: NEGATIVE
Comment: NORMAL
Neisseria Gonorrhea: NEGATIVE
Trichomonas: NEGATIVE

## 2020-07-10 IMAGING — DX DG CHEST 1V PORT
1 series · 1 of 1 positions shown · non-contrast
Comparison: May 22, 2019

CLINICAL DATA: Shortness of breath and cough. IXA7F-OH positive.

EXAM:
PORTABLE CHEST 1 VIEW

[chest]
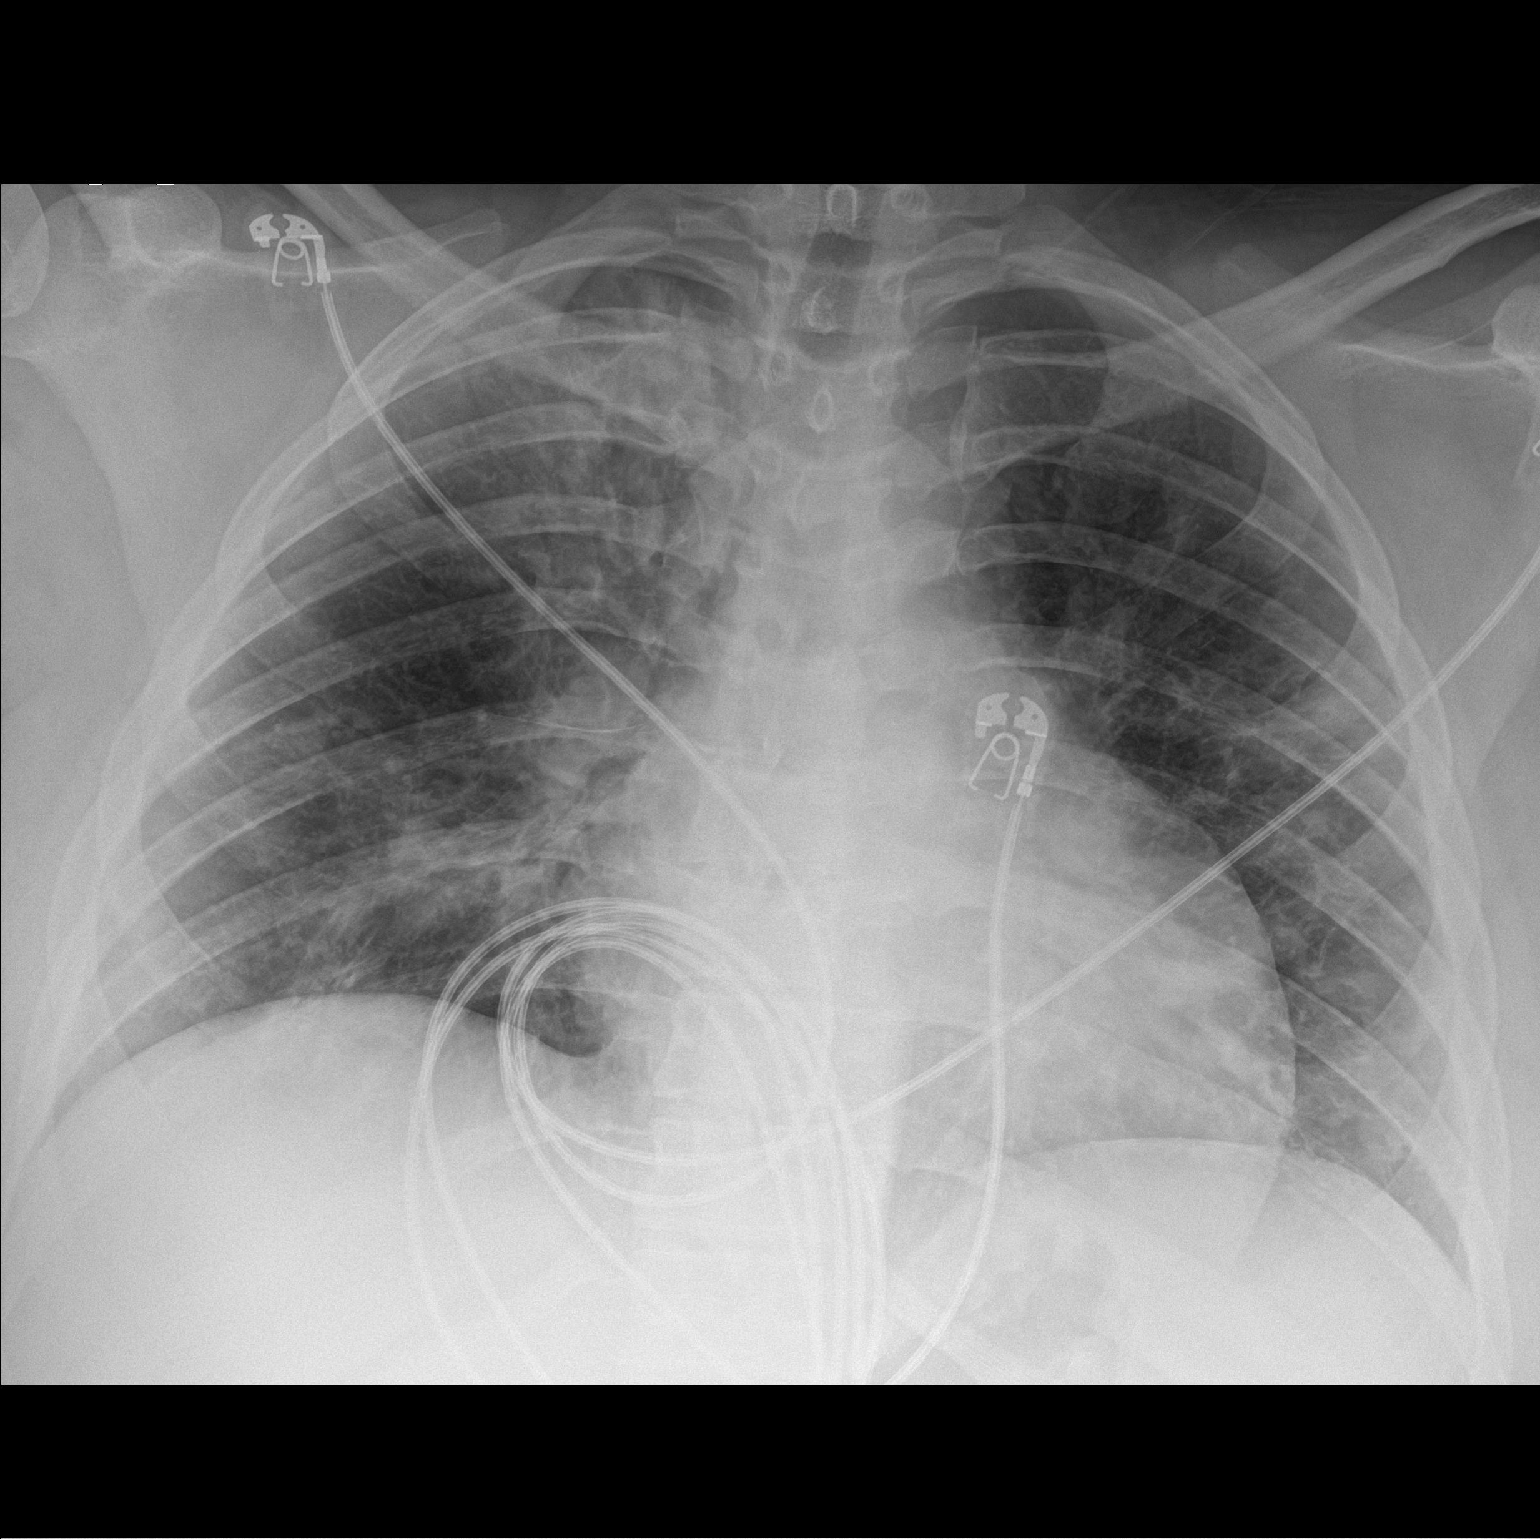

[1 of 1 positions shown; findings below may reference images not displayed]

FINDINGS: Cardiomediastinal silhouette is mildly enlarged.

Interval development of patchy bilateral airspace opacities with
lower lobe predominance.

Osseous structures are without acute abnormality. Soft tissues are
grossly normal.
IMPRESSION: 1. Interval development of patchy bilateral airspace opacities with
lower lobe predominance consistent with atypical pneumonia.
2. Enlarged cardiac silhouette.

## 2020-07-12 IMAGING — DX DG CHEST 1V PORT
1 series · 1 of 1 positions shown · non-contrast
Comparison: 05/29/2019
COMPARISON: 05/29/2019

Addendum:
CLINICAL DATA: Status post PICC line placement

EXAM:
PORTABLE CHEST 1 VIEW

[chest ap]
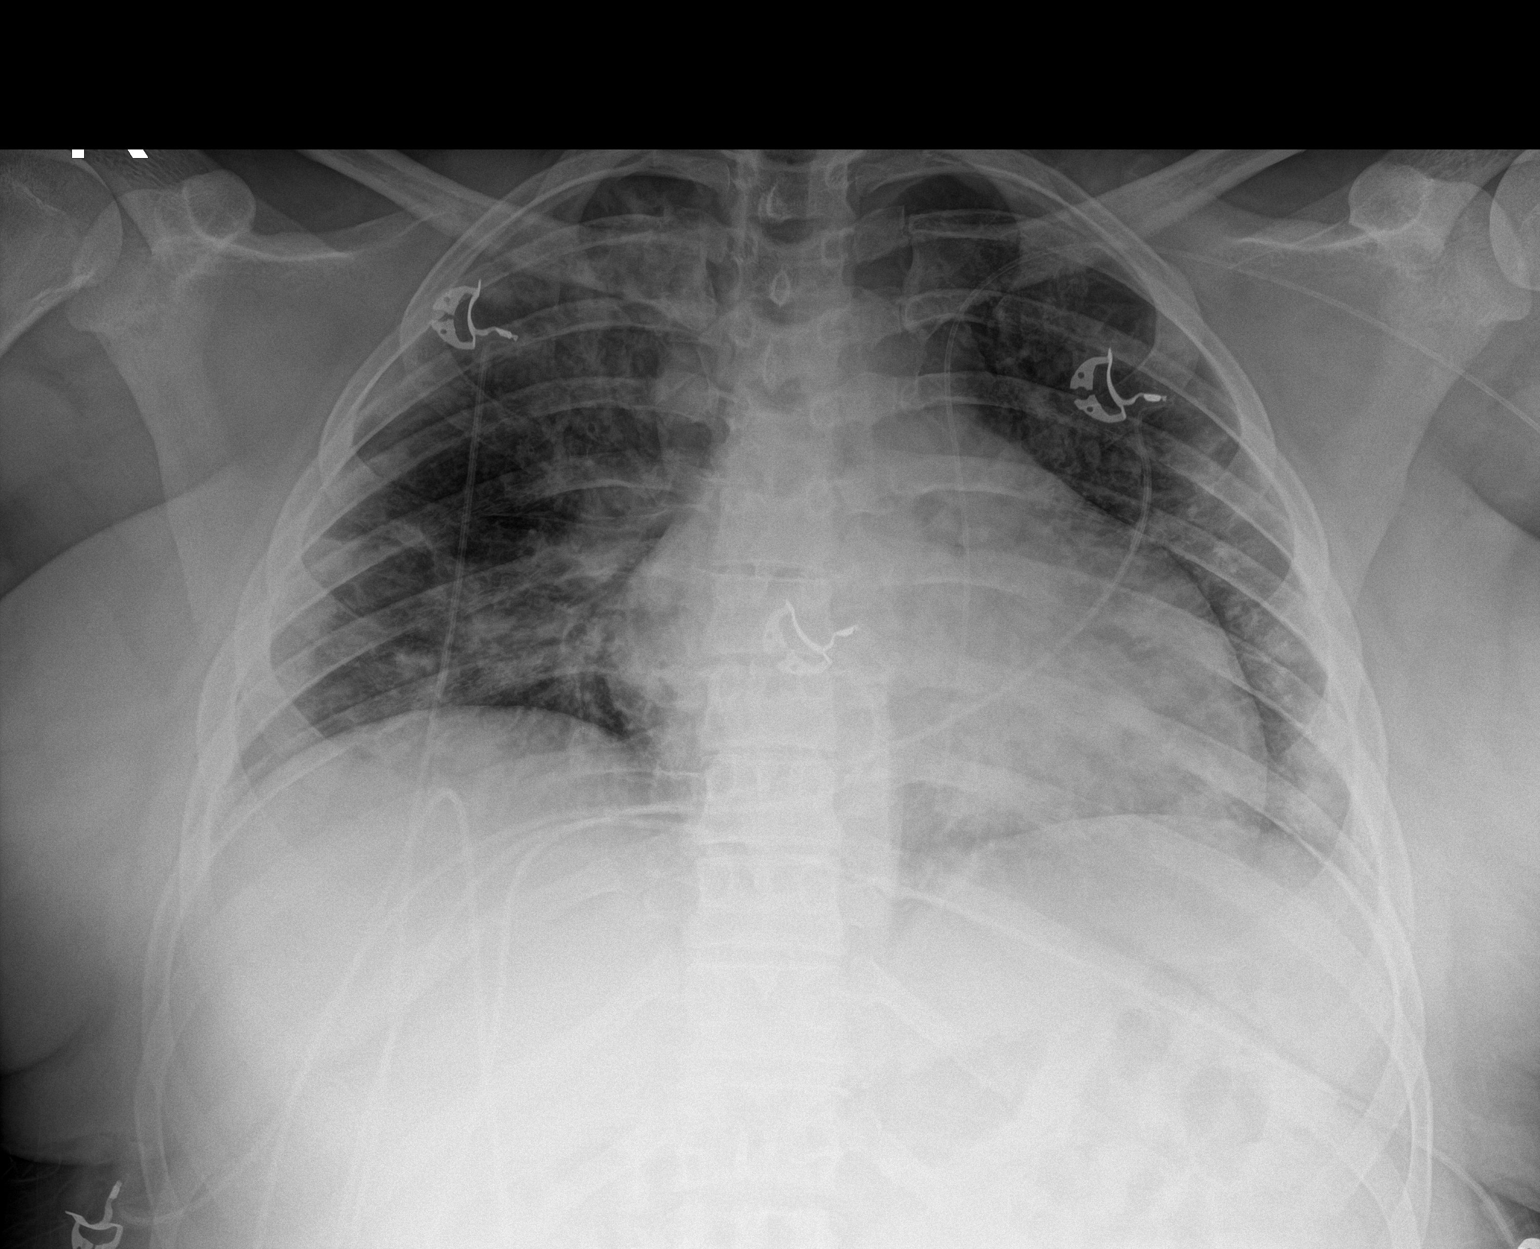

[1 of 1 positions shown; findings below may reference images not displayed]

FINDINGS: Cardiac shadow is again mildly prominent. New left-sided PICC line
is seen with the catheter tip presenting over the mid cardiac
shadow. Recent echocardiography suggested a left superior vena cava
and the catheter position with support this diagnosis. The lungs are
well aerated bilaterally with patchy opacities consistent with the
given clinical history of 2KODL-CP positivity. No bony abnormality
is seen.
IMPRESSION: PICC line placement with the catheter tip projecting over the
cardiac shadow to the left of the midline likely within a persistent
left superior vena cava.

Scattered opacities bilaterally consistent with the given clinical
history.

ADDENDUM:
It should be noted left SVC typically drains into the coronary sinus
and subsequently the right atrium as suggested on recent
echocardiogram. Current catheter position does not reach the right
atrium and is satisfactory for usage.

*** End of Addendum ***
FINDINGS: Cardiac shadow is again mildly prominent. New left-sided PICC line
is seen with the catheter tip presenting over the mid cardiac
shadow. Recent echocardiography suggested a left superior vena cava
and the catheter position with support this diagnosis. The lungs are
well aerated bilaterally with patchy opacities consistent with the
given clinical history of 2KODL-CP positivity. No bony abnormality
is seen.
IMPRESSION: PICC line placement with the catheter tip projecting over the
cardiac shadow to the left of the midline likely within a persistent
left superior vena cava.

Scattered opacities bilaterally consistent with the given clinical
history.

## 2020-07-12 IMAGING — US US MFM OB DETAIL+14 WK
1 series · 12 of 28 positions shown · non-contrast
Comparison: none

[Series 1: us mfm ob detail+14 wk · 12 of 87 slices shown]
[im 4/87]
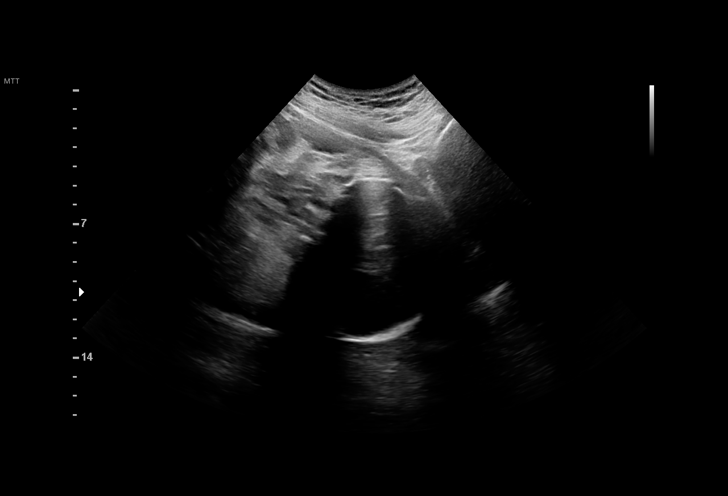
[im 10/87]
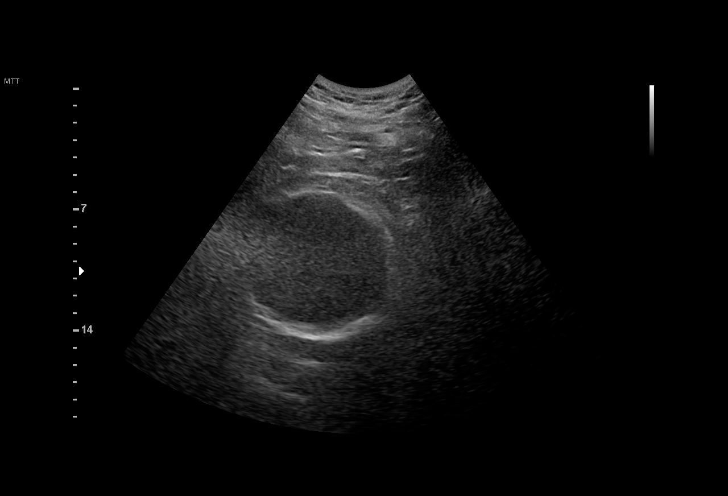
[im 16/87]
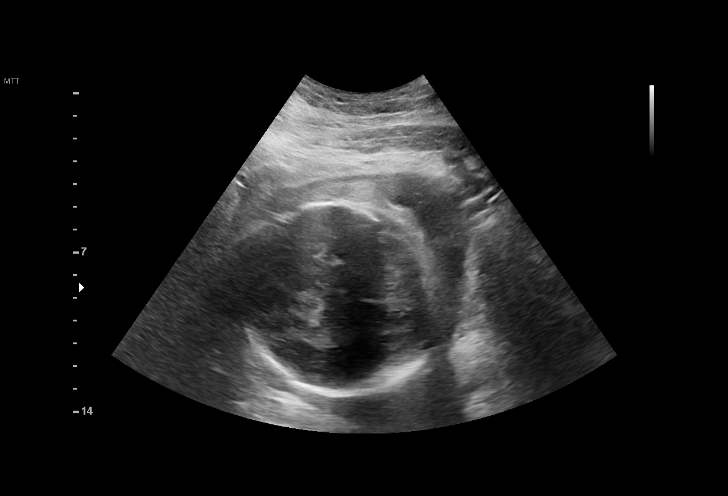
[im 26/87]
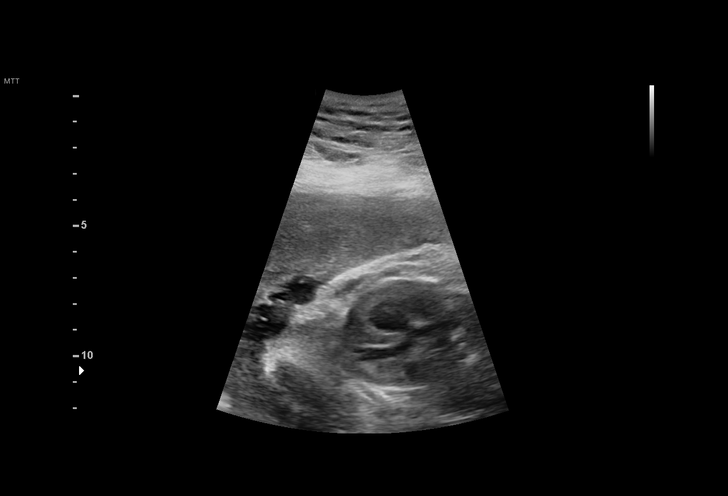
[im 32/87]
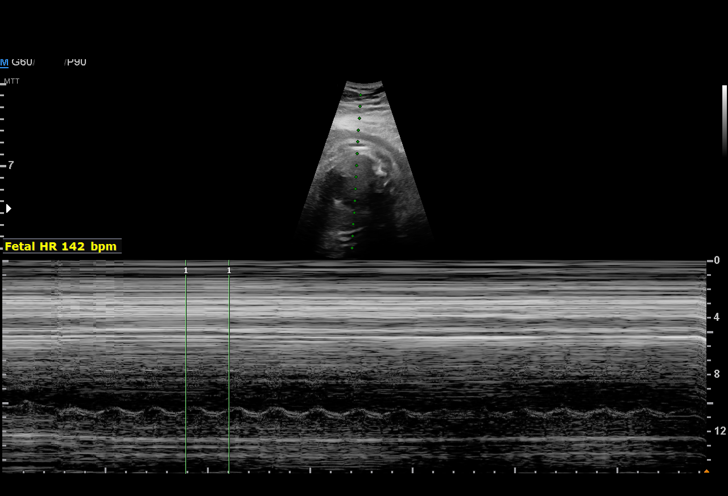
[im 39/87]
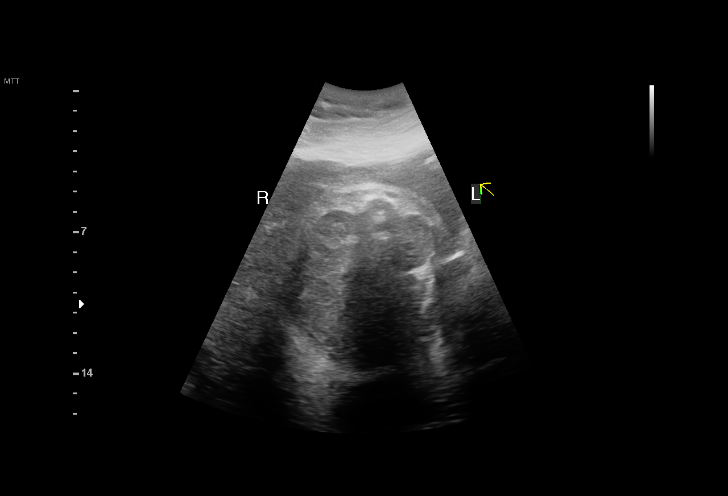
[im 48/87]
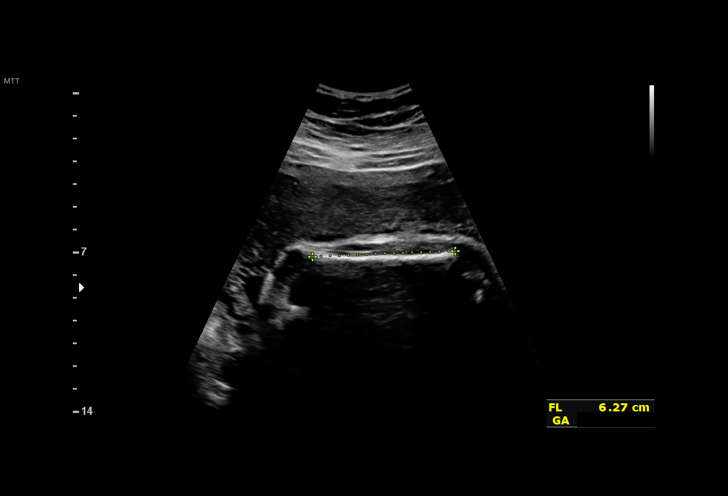
[im 55/87]
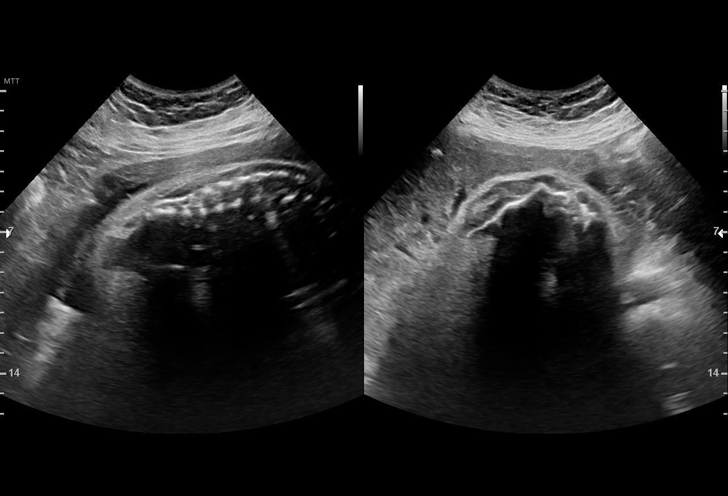
[im 61/87]
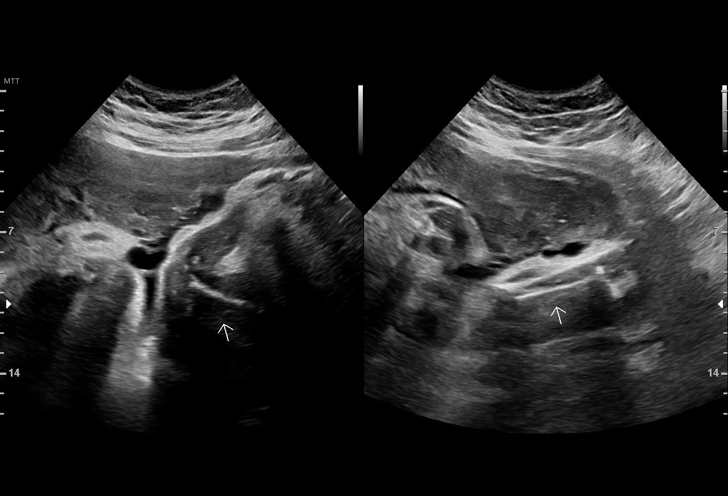
[im 71/87]
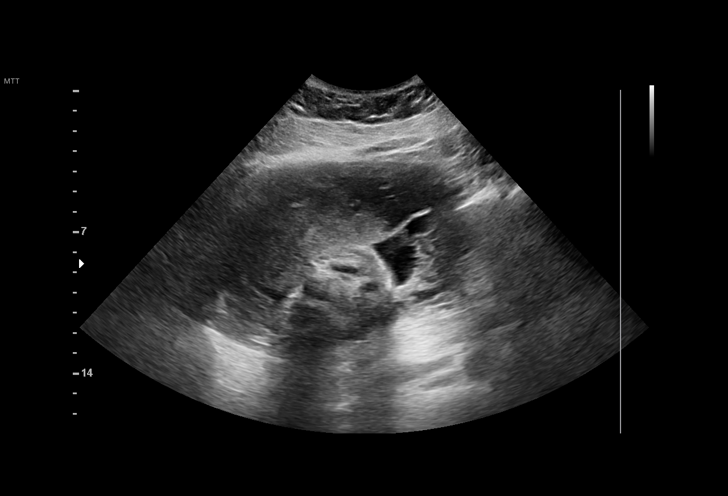
[im 77/87]
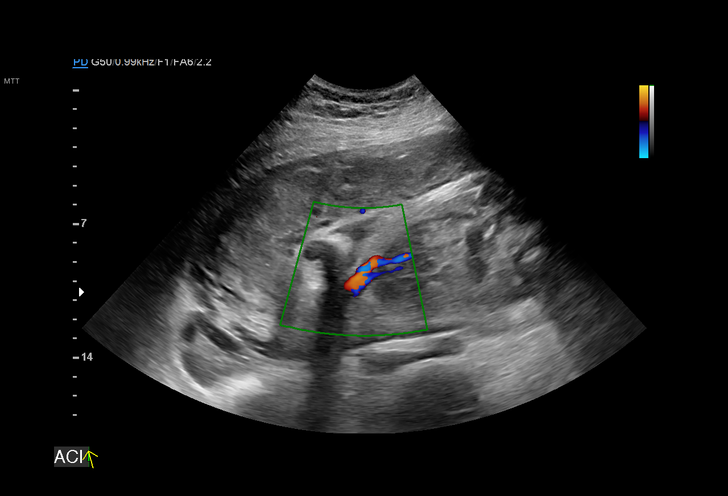
[im 83/87]
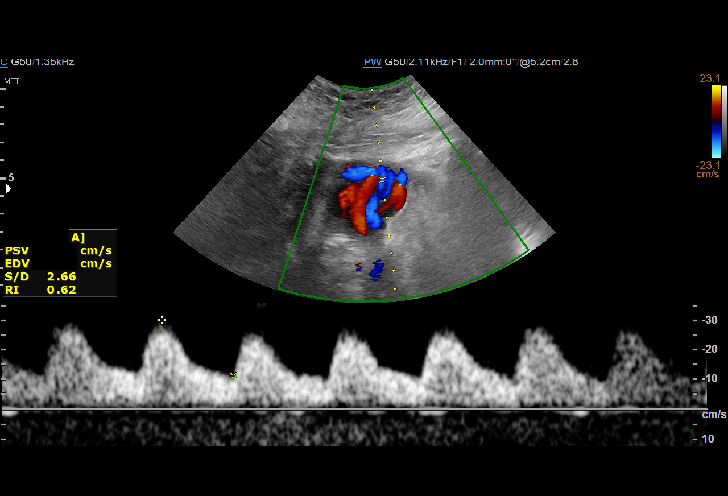

[12 of 28 positions shown; findings below may reference images not displayed]

[REDACTED]
                                                            Care

 2  US MFM UA CORD DOPPLER                76820.02    OLIMPIA TIGER

Indications

 Gestational diabetes in pregnancy,
 controlled by oral hypoglycemic drugs, DKA
 Oligohydraminios, third trimester, unspecified
 32 weeks gestation of pregnancy
 Obesity complicating pregnancy, third
 trimester
 Unspecified maternal hypertension, third
 trimester
 [id number] (+)  ([DATE])
 Encounter for antenatal screening for
 malformations
 Encounter for other antenatal screening
 follow-up
Fetal Evaluation

 Num Of Fetuses:         1
 Fetal Heart Rate(bpm):  142
 Cardiac Activity:       Observed
 Presentation:           Cephalic
 Placenta:               Anterior
 P. Cord Insertion:      Not well visualized
 Amniotic Fluid
 AFI FV:      Oligohydramnios

 AFI Sum(cm)     %Tile       Largest Pocket(cm)
 5.3             < 3

 RUQ(cm)       RLQ(cm)       LUQ(cm)        LLQ(cm)
 1.8           0
Biophysical Evaluation

 Amniotic F.V:   Pocket => 2 cm             F. Tone:        Not Observed
 F. Movement:    Observed                   Score:          [DATE]
 F. Breathing:   Observed
Biometry

 BPD:      82.8  mm     G. Age:  33w 2d         76  %    CI:        83.92   %    70 - 86
                                                         FL/HC:      21.8   %    19.1 -
 HC:      284.9  mm     G. Age:  31w 2d        4.3  %    HC/AC:      0.98        0.96 -
 AC:       292   mm     G. Age:  33w 1d         79  %    FL/BPD:     75.1   %    71 - 87
 FL:       62.2  mm     G. Age:  32w 2d         39  %    FL/AC:      21.3   %    20 - 24
 HUM:        57  mm     G. Age:  33w 1d         71  %
 CER:      41.3  mm     G. Age:  35w 2d         87  %

 LV:        4.5  mm

 Est. FW:    9840  gm      4 lb 8 oz     58  %
OB History

 Gravidity:    1
Gestational Age

 LMP:           32w 1d        Date:  10/18/18                 EDD:   07/25/19
 U/S Today:     32w 4d                                        EDD:   07/22/19
 Best:          32w 1d     Det. By:  LMP  (10/18/18)          EDD:   07/25/19
Anatomy

 Cranium:               Not well visualized    LVOT:                   Appears normal
 Cavum:                 Not well visualized    Aortic Arch:            Not well visualized
 Ventricles:            Appears normal         Ductal Arch:            Appears normal
 Choroid Plexus:        Appears normal         Diaphragm:              Appears normal
 Cerebellum:            Not well visualized    Stomach:                Appears normal, left
                                                                       sided
 Posterior Fossa:       Not well visualized    Abdomen:                Appears normal
 Nuchal Fold:           Not applicable (>20    Abdominal Wall:         Not well visualized
                        wks GA)
 Face:                  Orbits nl; profile not Cord Vessels:           Appears normal (3
                        well visualized                                vessel cord)
 Lips:                  Not well visualized    Kidneys:                Appear normal
 Palate:                Not well visualized    Bladder:                Appears normal
 Thoracic:              Appears normal         Spine:                  Ltd views no
                                                                       intracranial signs of
                                                                       NTD
 Heart:                 Appears normal         Upper Extremities:      Visualized
                        (4CH, axis, and
                        situs)
 RVOT:                  Appears normal         Lower Extremities:      Visualized
 Other:  Technically difficult due to maternal habitus and fetal position.
         Technically difficult due to low amniotic fluid.
Doppler - Fetal Vessels

 Umbilical Artery
  S/D     %tile      RI               PI                     ADFV    RDFV
  2.96       67    0.66             0.88                        No      No

Cervix Uterus Adnexa

 Cervix
 Not visualized (advanced GA >34wks)

 Uterus
 No abnormality visualized.
Comments

 This patient has been hospitalized due to diabetic
 ketoacidosis and possible preeclampsia.  Her blood
 pressures remain in the 120s to 150s over 80s to 90s range.
 Her blood glucose levels are now in the normal range and her
 diabetic ketoacidosis is resolving.

 On today's exam, the overall fetal growth was appropriate for
 her gestational age.  Oligohydramnios continues to be noted
 today.

 Doppler studies of the umbilical arteries performed today
 shows a normal S/D ratio.  There were no signs of absent or
 reversed end-diastolic flow.

 A biophysical profile performed today was [DATE].  She
 received a -2 for absent fetal tone.

 Her 24-hour urine results are currently pending.  Based on
 her elevated blood pressures and her elevated P/C ratio, she
 probably is developing preeclampsia.

 Due to probable preeclampsia at her current gestational age,
 now that her diabetic ketoacidosis is resolving, I would
 recommend that she receive a complete course of antenatal
 corticosteroids.  She should be kept on IV insulin while she is
 receiving her steroid course.

 She should have another ultrasound later this week to
 reassess the amniotic fluid levels.

## 2020-09-06 ENCOUNTER — Other Ambulatory Visit (HOSPITAL_COMMUNITY)
Admission: RE | Admit: 2020-09-06 | Discharge: 2020-09-06 | Disposition: A | Discharge status: Home / Self Care | Payer: Medicaid Other | Source: Ambulatory Visit | Attending: Obstetrics and Gynecology | Admitting: Obstetrics and Gynecology

## 2020-09-06 ENCOUNTER — Encounter: Payer: Self-pay | Admitting: Obstetrics and Gynecology

## 2020-09-06 ENCOUNTER — Ambulatory Visit (INDEPENDENT_AMBULATORY_CARE_PROVIDER_SITE_OTHER): Payer: Medicaid Other | Admitting: Obstetrics and Gynecology

## 2020-09-06 ENCOUNTER — Other Ambulatory Visit: Payer: Self-pay

## 2020-09-06 VITALS — BP 137/85 | HR 106 | Ht 72.0 in | Wt 305.3 lb

## 2020-09-06 DIAGNOSIS — E119 Type 2 diabetes mellitus without complications: Secondary | ICD-10-CM | POA: Diagnosis not present

## 2020-09-06 DIAGNOSIS — N898 Other specified noninflammatory disorders of vagina: Secondary | ICD-10-CM

## 2020-09-06 DIAGNOSIS — Z124 Encounter for screening for malignant neoplasm of cervix: Secondary | ICD-10-CM

## 2020-09-06 DIAGNOSIS — Z113 Encounter for screening for infections with a predominantly sexual mode of transmission: Secondary | ICD-10-CM | POA: Diagnosis not present

## 2020-09-06 DIAGNOSIS — Z01419 Encounter for gynecological examination (general) (routine) without abnormal findings: Secondary | ICD-10-CM | POA: Diagnosis not present

## 2020-09-06 MED ORDER — METFORMIN HCL 500 MG PO TABS: 500.0000 mg | ORAL_TABLET | Freq: Two times a day (BID) | ORAL | 1 refills | Status: DC

## 2020-09-06 MED ORDER — VALACYCLOVIR HCL 1 G PO TABS: 1000.0000 mg | ORAL_TABLET | Freq: Two times a day (BID) | ORAL | 2 refills | Status: AC

## 2020-09-06 NOTE — Progress Notes (Signed)
GYNECOLOGY ANNUAL PREVENTATIVE CARE ENCOUNTER NOTE  History:     Kelli Mcpherson is a 24 y.o. G33P0101 female here for a routine annual gynecologic exam.  Current complaints: pelvic pain x 3 months. She had an IUD placed at [facility] on July 29, 2019. The pain started in the last 3 months. The pain worsens with intercourse. The pain is worse during insertion. She has noticed spotting after intercourse. She does not use lubrication during intercourse.   She was seen at urgent care for IUD check and was told that her IUD strings were long. Urgent care told her to f/u with [place]. Last pap was normal however was inconclusive for transformation zone.  Denies abnormal vaginal bleeding, discharge, pelvic pain, problems with intercourse or other gynecologic concerns.   Recent diagnoses of Type 2 DM.  Last A1c was 10.4. She is currently taking Metformin 500 mg BID. She is concerned because her PCP is no longer available and she does not have a PCP at this time.    Gynecologic History No LMP recorded. (Menstrual status: IUD). Contraception: IUD Last Pap: Negative.  Result was normal with negative HPV, no transformation zone.    Obstetric History OB History  Gravida Para Term Preterm AB Living  1 1   1   1   SAB IAB Ectopic Multiple Live Births        0 1    # Outcome Date GA Lbr Len/2nd Weight Sex Delivery Anes PTL Lv  1 Preterm 06/09/19 [redacted]w[redacted]d 5 lb 9.2 oz (2.53 kg) M Vag-Spont EPI  LIV    Past Medical History:  Diagnosis Date   Chlamydia    COVID-19    Eczema    Gestational diabetes    Gonorrhea    Pregnancy induced hypertension     Past Surgical History:  Procedure Laterality Date   LYMPHADENECTOMY     WISDOM TOOTH EXTRACTION      Current Outpatient Medications on File Prior to Visit  Medication Sig Dispense Refill   metFORMIN (GLUCOPHAGE) 500 MG tablet Take 1 tablet (500 mg total) by mouth 2 (two) times daily with a meal. 60 tablet 1   acetaminophen (TYLENOL) 500 MG  tablet Take 500 mg by mouth every 6 (six) hours as needed. (Patient not taking: Reported on 09/06/2020)     fluconazole (DIFLUCAN) 150 MG tablet Take one tablet by mouth daily for 3 days. (Patient not taking: Reported on 09/06/2020) 3 tablet 0   furosemide (LASIX) 20 MG tablet Take 1 tablet (20 mg total) by mouth daily for 10 days. 10 tablet 0   glucose blood test strip Check your sugar in the morning before you eat breakfast, and one hour after a meal. (Patient not taking: Reported on 09/06/2020) 100 each 2   glucose monitoring kit (FREESTYLE) monitoring kit 1 each by Does not apply route daily. Check glucose once in the morning before breakfast and 1 hour after a meal (Patient not taking: Reported on 09/06/2020) 1 each 0   [DISCONTINUED] enalapril (VASOTEC) 10 MG tablet Take 1 tablet (10 mg total) by mouth daily. 30 tablet 0   No current facility-administered medications on file prior to visit.    No Known Allergies  Social History:  reports that she has quit smoking. Her smoking use included cigars. She has quit using smokeless tobacco. She reports that she does not drink alcohol and does not use drugs.  Family History  Problem Relation Age of Onset   Diabetes  Mother    Hypertension Mother    Hypertension Father     The following portions of the patient's history were reviewed and updated as appropriate: allergies, current medications, past family history, past medical history, past social history, past surgical history and problem list.  Review of Systems Pertinent items noted in HPI and remainder of comprehensive ROS otherwise negative.  Physical Exam:  BP 137/85   Pulse (!) 106   Ht 6' (1.829 m)   Wt (!) 305 lb 4.8 oz (138.5 kg)   Breastfeeding No   BMI 41.41 kg/m  CONSTITUTIONAL: Well-developed, well-nourished female in no acute distress.  HENT:  Normocephalic, atraumatic, External right and left ear normal.  EYES: Conjunctivae and EOM are normal. Pupils are equal, round, and  reactive to light. No scleral icterus.  NECK: Normal range of motion, supple, no masses.  Normal thyroid.  SKIN: Skin is warm and dry. No rash noted. Not diaphoretic. No erythema. No pallor. MUSCULOSKELETAL: Normal range of motion. No tenderness.  No cyanosis, clubbing, or edema. NEUROLOGIC: Alert and oriented to person, place, and time. Normal reflexes, muscle tone coordination.  PSYCHIATRIC: Normal mood and affect. Normal behavior. Normal judgment and thought content. CARDIOVASCULAR: Normal heart rate noted, regular rhythm RESPIRATORY: Clear to auscultation bilaterally. Effort and breath sounds normal, no problems with respiration noted. BREASTS: Symmetric in size. No masses, tenderness, skin changes, nipple drainage, or lymphadenopathy bilaterally. Performed in the presence of a chaperone. ABDOMEN: Soft, no distention noted.  No tenderness, rebound or guarding.  PELVIC: Normal appearing external genitalia and urethral meatus; normal appearing vaginal mucosa and cervix.  No abnormal vaginal discharge noted.  Pap smear obtained.  IUD strings noted and tangled at the cervix about 5 cm long. IUD strings trimmed to 2 cm. Normal uterine size, no other palpable masses, no uterine or adnexal tenderness.  Performed in the presence of a chaperone.   Assessment and Plan:    1. Screening examination for STD (sexually transmitted disease)  - HIV Antibody (routine testing w rflx) - RPR - Hepatitis C Antibody - Hepatitis B Surface AntiGEN  2. Screening for cervical cancer  - Cytology - PAP( [place])  3. Vaginal itching  - Cervicovaginal ancillary only( [place])  4. Encounter for annual routine gynecological examination  - HgB A1c - US Abdomen Complete; Future  5. Type 2 diabetes mellitus without complication, without long-term current use of insulin (HCC)  - HgB A1c  - Refill on Meformin. She needs PCP, will wait for A1c and assist patient with finding PCP.    Will follow up  results of pap smear and manage accordingly. Routine preventative health maintenance measures emphasized. Please refer to After Visit Summary for other counseling recommendations.    Shamara Soza, Artist Pais, [place] for [employer], [place]

## 2020-09-06 NOTE — Progress Notes (Signed)
Patient reports pain during intercourse along with spotting during and afterwards. Kelli Mcpherson also believes that she has a yeast infection due to her vaginal itching/abnormal discharge and would like to be tested for that. Patient would also like full STD screening along with a pap smear   Kelli Mcpherson, CMA  09/06/20

## 2020-09-07 LAB — CERVICOVAGINAL ANCILLARY ONLY
Bacterial Vaginitis (gardnerella): NEGATIVE
Candida Glabrata: NEGATIVE
Candida Vaginitis: POSITIVE — AB
Chlamydia: NEGATIVE
Comment: NEGATIVE
Comment: NEGATIVE
Comment: NEGATIVE
Comment: NEGATIVE
Comment: NEGATIVE
Comment: NORMAL
Neisseria Gonorrhea: NEGATIVE
Trichomonas: NEGATIVE

## 2020-09-07 LAB — RPR: RPR Ser Ql: NONREACTIVE

## 2020-09-07 LAB — HEMOGLOBIN A1C
Est. average glucose Bld gHb Est-mCnc: 237 mg/dL
Hgb A1c MFr Bld: 9.9 % — ABNORMAL HIGH (ref 4.8–5.6)

## 2020-09-07 LAB — HEPATITIS B SURFACE ANTIGEN: Hepatitis B Surface Ag: NEGATIVE

## 2020-09-07 LAB — HIV ANTIBODY (ROUTINE TESTING W REFLEX): HIV Screen 4th Generation wRfx: NONREACTIVE

## 2020-09-07 LAB — HEPATITIS C ANTIBODY: Hep C Virus Ab: <0.1 s/co ratio (ref 0.0–0.9)

## 2020-09-11 ENCOUNTER — Other Ambulatory Visit: Payer: Self-pay | Admitting: Obstetrics and Gynecology

## 2020-09-11 DIAGNOSIS — T8384XA Pain from genitourinary prosthetic devices, implants and grafts, initial encounter: Secondary | ICD-10-CM

## 2020-09-11 DIAGNOSIS — R102 Pelvic and perineal pain: Secondary | ICD-10-CM

## 2020-09-11 DIAGNOSIS — E119 Type 2 diabetes mellitus without complications: Secondary | ICD-10-CM

## 2020-09-11 LAB — CYTOLOGY - PAP
Comment: NEGATIVE
Diagnosis: UNDETERMINED — AB
High risk HPV: NEGATIVE

## 2020-09-11 MED ORDER — FLUCONAZOLE 150 MG PO TABS: ORAL_TABLET | ORAL | 0 refills | Status: DC

## 2020-09-13 ENCOUNTER — Telehealth: Payer: Self-pay | Admitting: Registered"

## 2020-09-13 ENCOUNTER — Other Ambulatory Visit: Payer: Medicaid Other

## 2020-09-13 NOTE — Telephone Encounter (Signed)
Called patient to reschedule her missed appointment today but received voicemail and mailbox was full.

## 2020-09-17 ENCOUNTER — Other Ambulatory Visit: Payer: Self-pay

## 2020-09-17 ENCOUNTER — Ambulatory Visit
Admission: RE | Admit: 2020-09-17 | Discharge: 2020-09-17 | Disposition: A | Discharge status: Home / Self Care | Payer: Medicaid Other | Source: Ambulatory Visit | Attending: Obstetrics and Gynecology | Admitting: Obstetrics and Gynecology

## 2020-09-17 DIAGNOSIS — T8332XA Displacement of intrauterine contraceptive device, initial encounter: Secondary | ICD-10-CM | POA: Diagnosis not present

## 2020-09-17 DIAGNOSIS — R102 Pelvic and perineal pain: Secondary | ICD-10-CM | POA: Diagnosis not present

## 2020-09-17 DIAGNOSIS — T8384XA Pain from genitourinary prosthetic devices, implants and grafts, initial encounter: Secondary | ICD-10-CM | POA: Diagnosis not present

## 2020-10-05 ENCOUNTER — Other Ambulatory Visit: Payer: Self-pay

## 2020-10-05 ENCOUNTER — Other Ambulatory Visit (HOSPITAL_COMMUNITY)
Admission: RE | Admit: 2020-10-05 | Discharge: 2020-10-05 | Disposition: A | Discharge status: Home / Self Care | Payer: Medicaid Other | Source: Ambulatory Visit | Attending: Family | Admitting: Family

## 2020-10-05 ENCOUNTER — Ambulatory Visit (INDEPENDENT_AMBULATORY_CARE_PROVIDER_SITE_OTHER): Payer: Medicaid Other | Admitting: Family

## 2020-10-05 ENCOUNTER — Encounter: Payer: Self-pay | Admitting: Family

## 2020-10-05 ENCOUNTER — Other Ambulatory Visit: Payer: Self-pay | Admitting: Family

## 2020-10-05 VITALS — BP 148/85 | HR 87 | Ht 72.0 in | Wt 300.1 lb

## 2020-10-05 DIAGNOSIS — Z30432 Encounter for removal of intrauterine contraceptive device: Secondary | ICD-10-CM

## 2020-10-05 DIAGNOSIS — Z308 Encounter for other contraceptive management: Secondary | ICD-10-CM | POA: Insufficient documentation

## 2020-10-05 DIAGNOSIS — N898 Other specified noninflammatory disorders of vagina: Secondary | ICD-10-CM

## 2020-10-05 DIAGNOSIS — B379 Candidiasis, unspecified: Secondary | ICD-10-CM | POA: Diagnosis not present

## 2020-10-05 DIAGNOSIS — R030 Elevated blood-pressure reading, without diagnosis of hypertension: Secondary | ICD-10-CM | POA: Insufficient documentation

## 2020-10-05 DIAGNOSIS — Z23 Encounter for immunization: Secondary | ICD-10-CM | POA: Diagnosis not present

## 2020-10-05 MED ORDER — NORETHIN ACE-ETH ESTRAD-FE 1-20 MG-MCG(24) PO TABS: 1.0000 | ORAL_TABLET | Freq: Every day | ORAL | 11 refills | Status: DC

## 2020-10-05 NOTE — Progress Notes (Signed)
History:  Kelli Mcpherson is a 25 y.o. G1P0101 who presents to clinic today for IUD removal.  In place x 1 year; concerned due to back pain and pelvic pain.  Reports +white discharge with itching.  No reports of UTI symptoms.  With partner x 1 year.  Does not intend pregnancy in 1 year.  Desires OCPs.  Used Nexplanon in the past and "did not feel like herself", mood returned to normal after remove.   The following portions of the patient's history were reviewed and updated as appropriate: allergies, current medications, family history, past medical history, social history, past surgical history and problem list.  Review of Systems:  Review of Systems  Pertinent information in HPI   Objective:  Physical Exam BP (!) 148/85   Pulse 87   Ht 6' (1.829 m)   Wt (!) 300 lb 1.6 oz (136.1 kg)   LMP  (LMP Unknown)   Breastfeeding No   BMI 40.70 kg/m  Physical Exam Constitutional:      Appearance: She is well-developed.  HENT:     Head: Normocephalic.  Abdominal:     Palpations: Abdomen is soft.     Tenderness: There is no abdominal tenderness.  Genitourinary:    Vagina: Vaginal discharge present.     Cervix: No cervical motion tenderness, discharge or friability.     Comments: IUD string visualized at normal length; red vaginal area, no lesions Musculoskeletal:     Cervical back: Normal range of motion and neck supple.  Skin:    General: Skin is warm and dry.  Neurological:     Mental Status: She is alert and oriented to person, place, and time.   Pt consented for IUD removal.  Pt placed in lithotomy position.  Speculum inserted and IUD removed without difficulty.   Speculum removed.   Labs and Imaging No results found for this or any previous visit (from the past 24 hour(s)).  No results found.   Assessment & Plan:   IUD Removal - Contraceptive counseling provided; given RX for OCPs per patient desires after reviewing all methods;  explained pelvic pain may not resolve  after IUD removal Elevated Blood Pressure -  Appointment scheduled with a PCP to establish care  Vaginal Discharge - swab obtained  Amedeo Gory, CNM 10/05/2020 10:33 AM

## 2020-10-05 NOTE — Progress Notes (Signed)
Patient in today for IUD removal. States that since getting the IUD she has had ongoing abdominal pain, back pain. Has been spotting off and on for the last year. Had placement check in august along with [place]. Would like to proceed with pills at this time. Flu vaccine administered at this time.  Wynona Canes, CMA

## 2020-10-05 NOTE — Patient Instructions (Signed)
AREA FAMILY PRACTICE PHYSICIANS  [place] ([address]) [facility] [address] [place], [place] [address] [phone] Mon-Fri 8:30-12:30, 1:30-5:00 Accepting Medicaid [facility] [address], [place], [place] [address] [phone] Mon-Fri 8:00-5:30 [facility] [address] [place], [place] [address] [phone] Mon, Tue, Thur, Fri 8:30-5:00, Wed 10:00-7:00 (closed 1-2pm) Accepting Medicaid [facility] [address], [place], [place]  [address] Phone - [phone]   Fax - [phone]  [place] ([address]) [facility] [address] [place], [place] [address] [phone] Mon-Fri 8:00-5:00 [facility] ([facility])  [address] [place], [place] [address] [phone] Mon-Fri 8:30-5:30, Sat (Oct.-Mar.) 9:00-1:00 Accepting Medicaid  [place] ([address]) [facility] [address], [place], [place] [address] [phone] Mon-Fri 8:00-5:00  [place] ([address]) [facility] [address], [place], [place] [address] [phone] Mon-Fri 8:00-5:00 [facility] [address], [place], [place] [address] [phone] Mon-Fri 8:00-5:00 [facility] [address] [place], [place] [address] [phone] Mon-Fri 8:00-5:00 [facility] [address] [place] [address] [phone] Mon-Fri 7:30-5:30  [place] ([address] & [address]) [facility] [address] [place], [place] [address] [phone] Mon-Thur 8:00-6:00 Accepting Medicaid [facility] [address] [place], [place] [address] [phone] Mon-Thur 7:30-7:30, Fri 7:30-4:30 Accepting  Medicaid [facility] [address], [place], [place]  [address] [phone]   Fax - [phone]  [place] ([address]) [facility] [address] [place], [place] [address] [phone] Mon-Fri 7:00-5:00 [facility] [address], [place], [place] [address] [phone] Mon-Fri 8:00-5:00 Accepting Medicaid [facility] [address], , [place] [address] [phone] Mon-Fri 8:00-5:00 Accepting Medicaid  [place] ([address]) [facility] [place] [address] [place], [place] [address] [phone] Mon-Fri 8:00-5:00 [facility] [address], [place], [place] [address] [phone] Mon-Fri 8:00-5:00 Accepting Medicaid [facility] ([facility]) [address], [place], [place] [address] [phone] Mon-Fri 8:00-5:30, Sat&Sun by appointment (phones open at 8:30) Accepting Medicaid  [place] ([address]) [facility] [address] [place], [place] [address] [phone] Mon-Thur 8:00-7:00, Fri 8:00-5:00, Sat 8:00-12:00, Sun 9:00-12:00 Accepting Medicaid [facility] [place] [address], [place], [place] [address] [phone] Mon-Thur 8:00-5:00 Accepting Medicaid [facility] [address] [place], [place] [address] [phone] Mon-Fri 8:00-5:30, Sat (Oct.-Mar.) 9:00-1:00 Accepting Medicaid  [place] ([address]) [facility] [address], [place], [place] [address] [phone] Mon-Fri 8:00-5:00 Accepting Medicaid   [place] ([address]) [facility] [place] [address], [place], [place] [address] [phone] Mon-Fri 8:00-5:00  [facility] [place] [address], [place],  [address] [phone] Mon-Fri 8:00-5:00 [facility] [place] [address] [place] [address], [place],  [address] [phone] Mon-Fri 8:00-5:00 After hours clinic ([address] [place],  [address]) [phone] Mon-Fri 5:00-8:00, Sat 12:00-6:00, Sun 10:00-4:00 Accepting Medicaid [facility] [place]   [address], [place], [place]  [address] [phone]   Fax - [phone]  [place] ([address]) [facility] [place] [address], [place], [place] [address] [phone] Mon-Fri 8:00-5:00 [facility] [place] ([facility] [place]) [address], [place], [place] [address] [phone] Mon-Thur 8:00-7:00, Fri 8:00-5:00, Sat 8:00-12:00    

## 2020-10-08 ENCOUNTER — Ambulatory Visit: Payer: Self-pay | Admitting: Nurse Practitioner

## 2020-10-08 LAB — CERVICOVAGINAL ANCILLARY ONLY
Bacterial Vaginitis (gardnerella): NEGATIVE
Candida Glabrata: NEGATIVE
Candida Vaginitis: POSITIVE — AB
Chlamydia: NEGATIVE
Comment: NEGATIVE
Comment: NEGATIVE
Comment: NEGATIVE
Comment: NEGATIVE
Comment: NEGATIVE
Comment: NORMAL
Neisseria Gonorrhea: NEGATIVE
Trichomonas: NEGATIVE

## 2020-10-08 MED ORDER — FLUCONAZOLE 150 MG PO TABS: 150.0000 mg | ORAL_TABLET | Freq: Once | ORAL | 0 refills | Status: AC

## 2020-10-09 ENCOUNTER — Other Ambulatory Visit: Payer: Self-pay | Admitting: Family Medicine

## 2020-10-09 MED ORDER — NORETHIN ACE-ETH ESTRAD-FE 1-20 MG-MCG(24) PO TABS: 1.0000 | ORAL_TABLET | Freq: Every day | ORAL | 11 refills | Status: DC

## 2020-10-09 MED ORDER — FLUCONAZOLE 150 MG PO TABS: 150.0000 mg | ORAL_TABLET | Freq: Once | ORAL | 0 refills | Status: AC

## 2020-10-09 NOTE — Progress Notes (Signed)
Re-order due to issues with Medicaid enrollment issues with previous provider order

## 2020-10-20 ENCOUNTER — Encounter: Payer: Self-pay | Admitting: Radiology

## 2020-11-08 DIAGNOSIS — I10 Essential (primary) hypertension: Secondary | ICD-10-CM

## 2020-11-12 ENCOUNTER — Ambulatory Visit: Payer: Medicaid Other | Admitting: Nurse Practitioner

## 2021-02-10 ENCOUNTER — Encounter (HOSPITAL_COMMUNITY): Payer: Self-pay | Admitting: *Deleted

## 2021-02-10 ENCOUNTER — Ambulatory Visit (HOSPITAL_COMMUNITY)
Admission: EM | Admit: 2021-02-10 | Discharge: 2021-02-10 | Disposition: A | Discharge status: Home / Self Care | Payer: 59 | Attending: Urgent Care | Admitting: Urgent Care

## 2021-02-10 ENCOUNTER — Other Ambulatory Visit: Payer: Self-pay

## 2021-02-10 DIAGNOSIS — L03012 Cellulitis of left finger: Secondary | ICD-10-CM | POA: Diagnosis not present

## 2021-02-10 DIAGNOSIS — B3731 Acute candidiasis of vulva and vagina: Secondary | ICD-10-CM | POA: Insufficient documentation

## 2021-02-10 DIAGNOSIS — R300 Dysuria: Secondary | ICD-10-CM | POA: Diagnosis not present

## 2021-02-10 DIAGNOSIS — E1165 Type 2 diabetes mellitus with hyperglycemia: Secondary | ICD-10-CM | POA: Diagnosis not present

## 2021-02-10 DIAGNOSIS — E11628 Type 2 diabetes mellitus with other skin complications: Secondary | ICD-10-CM

## 2021-02-10 DIAGNOSIS — A6004 Herpesviral vulvovaginitis: Secondary | ICD-10-CM | POA: Diagnosis not present

## 2021-02-10 HISTORY — DX: Herpesviral infection of urogenital system, unspecified: A60.00

## 2021-02-10 LAB — POCT URINALYSIS DIPSTICK, ED / UC
Bilirubin Urine: NEGATIVE
Glucose, UA: 250 mg/dL — AB
Hgb urine dipstick: NEGATIVE
Ketones, ur: NEGATIVE mg/dL
Leukocytes,Ua: NEGATIVE
Nitrite: NEGATIVE
Protein, ur: NEGATIVE mg/dL
Specific Gravity, Urine: >=1.03 (ref 1.005–1.030)
Urobilinogen, UA: 0.2 mg/dL (ref 0.0–1.0)
pH: 6 (ref 5.0–8.0)

## 2021-02-10 LAB — CBG MONITORING, ED: Glucose-Capillary: 152

## 2021-02-10 LAB — POC URINE PREG, ED: Preg Test, Ur: NEGATIVE

## 2021-02-10 MED ORDER — FLUCONAZOLE 150 MG PO TABS: ORAL_TABLET | ORAL | 0 refills | Status: DC

## 2021-02-10 MED ORDER — VALACYCLOVIR HCL 1 G PO TABS: 500.0000 mg | ORAL_TABLET | Freq: Two times a day (BID) | ORAL | 0 refills | Status: AC

## 2021-02-10 MED ORDER — CEPHALEXIN 500 MG PO CAPS: 500.0000 mg | ORAL_CAPSULE | Freq: Four times a day (QID) | ORAL | 0 refills | Status: DC

## 2021-02-10 NOTE — ED Provider Notes (Signed)
[place]    CSN: [id number] Arrival date & time: 02/10/21  1232      History   Chief Complaint Chief Complaint  Patient presents with   Paronychia   Dysuria   Vaginal Discharge    HPI Kelli Mcpherson is a 26 y.o. female.   Pleasant 26 year old female presents today with several concerns.  #1, she reports a history of recurrent yeast infections due to her diabetes.  She is followed by PCP and takes medication for this.  She does not routinely check her blood sugar.  She is requesting medication for her vaginal itching. She also reports some genital sores.  She states 2 years ago she was diagnosed with herpes simplex, however this was never confirmed.  She denies ever having bumps, but reports "cuts" to the area.  She states the cuts this time seemed to start after having a wax.  Because of the cuts, she states it hurts when she urinates.  Topically applying Aquaphor to the area without resolution to her symptoms. Lastly, patient is concerned regarding a "painful green bump" to her left middle finger.  She states she noticed it just yesterday, has not tried any treatments for it.  States she felt that there was an ingrown nail at 1 point and tried to pull it off, which is when her symptoms started.   Dysuria Associated symptoms: vaginal discharge   Vaginal Discharge Associated symptoms: dysuria    Past Medical History:  Diagnosis Date   Chlamydia    COVID-19    Eczema    Genital herpes    Gestational diabetes    Gonorrhea    Pregnancy induced hypertension     Patient Active Problem List   Diagnosis Date Noted   Elevated blood pressure reading 10/05/2020   Pre-eclampsia 06/08/2019   DKA (diabetic ketoacidosis) ([place]) 05/29/2019   Vaginal discharge 02/04/2018   Costochondral chest pain 10/13/2017   Episodic tension-type headache, not intractable 10/13/2017   Obesity 08/01/2015    Past Surgical History:  Procedure Laterality Date   WISDOM TOOTH  EXTRACTION      OB History     Gravida  1   Para  1   Term      Preterm  1   AB      Living  1      SAB      IAB      Ectopic      Multiple  0   Live Births  1            Home Medications    Prior to Admission medications   Medication Sig Start Date End Date Taking? Authorizing Provider  cephALEXin (KEFLEX) 500 MG capsule Take 1 capsule (500 mg total) by mouth 4 (four) times daily for 5 days. 02/10/21 02/15/21 Yes Japhet Morgenthaler L, PA  fluconazole (DIFLUCAN) 150 MG tablet Take one tab PO x 1 dose now, may repeat in 72 hours if needed 02/10/21  Yes Jovan Colligan L, PA  metFORMIN (GLUCOPHAGE) 500 MG tablet Take 1 tablet (500 mg total) by mouth 2 (two) times daily with a meal. 09/06/20  Yes Rasch, Anderson Malta I, NP  Norethindrone Acetate-Ethinyl Estrad-FE (LOESTRIN 24 FE) 1-20 MG-MCG(24) tablet Take 1 tablet by mouth daily. 10/09/20  Yes Clarnce Flock, MD  valACYclovir (VALTREX) 1000 MG tablet Take 0.5 tablets (500 mg total) by mouth 2 (two) times daily for 3 days. 02/10/21 02/13/21 Yes Summar Mcglothlin L, PA  acetaminophen (TYLENOL) 500  MG tablet Take 500 mg by mouth every 6 (six) hours as needed. Patient not taking: No sig reported    [provider]  furosemide (LASIX) 20 MG tablet Take 1 tablet (20 mg total) by mouth daily for 10 days. 06/20/19 06/30/19  Nugent, Gerrie Nordmann, NP  glucose blood test strip Check your sugar in the morning before you eat breakfast, and one hour after a meal. 04/07/20   Melynda Ripple, MD  glucose monitoring kit (FREESTYLE) monitoring kit 1 each by Does not apply route daily. Check glucose once in the morning before breakfast and 1 hour after a meal 04/07/20   Melynda Ripple, MD  enalapril (VASOTEC) 10 MG tablet Take 1 tablet (10 mg total) by mouth daily. 06/20/19 06/27/20  Nugent, Gerrie Nordmann, NP    Family History Family History  Problem Relation Age of Onset   Diabetes Mother    Hypertension Mother    Hypertension Father      Social History Social History   Tobacco Use   Smoking status: Former    Types: Cigars   Smokeless tobacco: Former   Tobacco comments:    Patient occasionally smokes hookah   Vaping Use   Vaping Use: Never used  Substance Use Topics   Alcohol use: Not Currently    Comment: socially   Drug use: Never     Allergies   Patient has no known allergies.   Review of Systems Review of Systems  Genitourinary:  Positive for dysuria, vaginal discharge and vaginal pain.  Skin:  Positive for wound (paronychia).    Physical Exam Triage Vital Signs ED Triage Vitals  Enc Vitals Group     BP 02/10/21 1432 134/87     Pulse Rate 02/10/21 1432 67     Resp 02/10/21 1432 18     Temp 02/10/21 1432 98.6 F (37 C)     Temp Source 02/10/21 1432 Oral     SpO2 02/10/21 1432 96 %     Weight --      Height --      Head Circumference --      Peak Flow --      Pain Score 02/10/21 1433 0     Pain Loc --      Pain Edu? --      Excl. in [place]? --    No data found.  Updated Vital Signs BP 134/87    Pulse 67    Temp 98.6 F (37 C) (Oral)    Resp 18    LMP 01/21/2021 (Approximate)    SpO2 96%    Breastfeeding No   Visual Acuity Right Eye Distance:   Left Eye Distance:   Bilateral Distance:    Right Eye Near:   Left Eye Near:    Bilateral Near:     Physical Exam Vitals and nursing note reviewed.  Constitutional:      General: She is not in acute distress.    Appearance: She is well-developed. She is obese. She is not ill-appearing.  HENT:     Head: Normocephalic.  Cardiovascular:     Rate and Rhythm: Normal rate.     Heart sounds: No murmur heard. Pulmonary:     Effort: Pulmonary effort is normal. No respiratory distress.     Breath sounds: No wheezing.  Abdominal:     General: Abdomen is flat. Bowel sounds are normal. There is no distension. There are no signs of injury.     Palpations: Abdomen is soft. There  is no shifting dullness, fluid wave, hepatomegaly, splenomegaly,  mass or pulsatile mass.     Tenderness: There is no abdominal tenderness. There is no right CVA tenderness, left CVA tenderness, guarding or rebound. Negative signs include Murphy's sign, Rovsing's sign, McBurney's sign, psoas sign and obturator sign.     Hernia: No hernia is present.  Genitourinary:    General: Normal vulva.     Pubic Area: No rash or pubic lice.      Labia:        Right: No rash, tenderness, lesion or injury.        Left: No rash, tenderness, lesion or injury.      Urethra: No prolapse, urethral pain, urethral swelling or urethral lesion.     Vagina: No signs of injury and foreign body. Vaginal discharge present. No erythema, tenderness, bleeding, lesions or prolapsed vaginal walls.     Cervix: No cervical motion tenderness, discharge, friability, lesion, erythema or eversion.     Uterus: Normal. Not deviated, not enlarged, not fixed, not tender and no uterine prolapse.      Adnexa: Right adnexa normal and left adnexa normal.       Right: No mass, tenderness or fullness.         Left: No mass, tenderness or fullness.       Rectum: Normal.     Comments: Numerous open sores, primarily to the L vulva/ perineal area Musculoskeletal:        General: Swelling (to L middle finger with serous drainage) and tenderness (to left middle finger to lateral nail bed) present. No deformity or signs of injury. Normal range of motion.     Right lower leg: No edema.     Left lower leg: No edema.  Skin:    General: Skin is warm.     Findings: Erythema (to L middle finger lateral nail bed) present.  Neurological:     Mental Status: She is alert.     UC Treatments / Results  Labs (all labs ordered are listed, but only abnormal results are displayed) Labs Reviewed  POCT URINALYSIS DIPSTICK, ED / UC - Abnormal; Notable for the following components:      Result Value   Glucose, UA 250 (*)    All other components within normal limits  POC URINE PREG, ED - Normal  AEROBIC CULTURE W  GRAM STAIN (SUPERFICIAL SPECIMEN)  HSV CULTURE AND TYPING  CBG MONITORING, ED  CERVICOVAGINAL ANCILLARY ONLY    EKG   Radiology No results found.  Procedures Incision and Drainage  Date/Time: 02/10/2021 5:01 PM Performed by: Chaney Malling, PA Authorized by: Chaney Malling, PA   Comments:     Paronychia nail trephination performed in office with #11 blade. (including critical care time)  Medications Ordered in UC Medications - No data to display  Initial Impression / Assessment and Plan / UC Course  I have reviewed the triage vital signs and the nursing notes.  Pertinent labs & imaging results that were available during my care of the patient were reviewed by me and considered in my medical decision making (see chart for details).     Candidiasis of vagina -Diflucan p.o. x1 now, may repeat after course of antibiotics.  This is likely due to her underlying diabetes.  This must be followed up and managed by PCP or endocrine HSV -patient has this previously diagnosed.  We will treat as such, however HSV culture was performed as the open sores do not look  like typical HSV Dysuria-does not appear to be urinary tract infection.  I suspect this is due to the urine coming in contact with the open sores.  I requested that she put Vaseline over the sores until resolved Paronychia -Epsom salt soaks numerous times daily, trephination performed in office.  Antibiotics as discussed.  Final Clinical Impressions(s) / UC Diagnoses   Final diagnoses:  Candidiasis, vagina  Herpes simplex vulvovaginitis  Paronychia of finger, left     Discharge Instructions      Take one Diflucan x1 dose today, repeat the second tablet after you complete your antibiotics. Use Valtrex until completed, will call with results of the swab. Soak your finger in Epsom salts 3 times daily. Take the antibiotics until completed. Eat plenty of yogurt to prevent a repeat vaginal yeast infection. Use Vaseline  over the open vaginal sores. Follow-up with your PCP    ED Prescriptions     Medication Sig Dispense Auth. Provider   cephALEXin (KEFLEX) 500 MG capsule Take 1 capsule (500 mg total) by mouth 4 (four) times daily for 5 days. 20 capsule Ellwood Steidle L, PA   fluconazole (DIFLUCAN) 150 MG tablet Take one tab PO x 1 dose now, may repeat in 72 hours if needed 2 tablet Allysa Governale L, PA   valACYclovir (VALTREX) 1000 MG tablet Take 0.5 tablets (500 mg total) by mouth 2 (two) times daily for 3 days. 6 tablet Jessen Siegman L, [place]      PDMP not reviewed this encounter.   Chaney Malling, [place] 02/10/21 1702

## 2021-02-10 NOTE — Discharge Instructions (Signed)
Take one Diflucan x1 dose today, repeat the second tablet after you complete your antibiotics. Use Valtrex until completed, will call with results of the swab. Soak your finger in Epsom salts 3 times daily. Take the antibiotics until completed. Eat plenty of yogurt to prevent a repeat vaginal yeast infection. Use Vaseline over the open vaginal sores. Follow-up with your PCP

## 2021-02-10 NOTE — ED Triage Notes (Signed)
Pt with noted paronychia to left middle finger onset last night. C/O dysuria and polyuria onset 2 days ago. C/O vaginal discharge and genital irritation onset last night. States has been told in the past that she has elevated sugar. Denies fevers.

## 2021-02-11 LAB — CERVICOVAGINAL ANCILLARY ONLY
Bacterial Vaginitis (gardnerella): NEGATIVE
Candida Glabrata: NEGATIVE
Candida Vaginitis: POSITIVE — AB
Chlamydia: NEGATIVE
Comment: NEGATIVE
Comment: NEGATIVE
Comment: NEGATIVE
Comment: NEGATIVE
Comment: NEGATIVE
Comment: NORMAL
Neisseria Gonorrhea: NEGATIVE
Trichomonas: NEGATIVE

## 2021-02-11 LAB — CBG MONITORING, ED: Glucose-Capillary: 152 mg/dL — ABNORMAL HIGH (ref 70–99)

## 2021-02-13 ENCOUNTER — Telehealth (HOSPITAL_COMMUNITY): Payer: Self-pay | Admitting: Emergency Medicine

## 2021-02-13 LAB — AEROBIC CULTURE W GRAM STAIN (SUPERFICIAL SPECIMEN): Gram Stain: NONE SEEN

## 2021-02-13 LAB — HSV CULTURE AND TYPING

## 2021-02-13 MED ORDER — DICLOXACILLIN SODIUM 250 MG PO CAPS: 250.0000 mg | ORAL_CAPSULE | Freq: Four times a day (QID) | ORAL | 0 refills | Status: AC

## 2021-03-18 ENCOUNTER — Encounter: Payer: Self-pay | Admitting: Family

## 2021-03-18 MED ORDER — VALACYCLOVIR HCL 500 MG PO TABS: 500.0000 mg | ORAL_TABLET | Freq: Two times a day (BID) | ORAL | 12 refills | Status: AC

## 2021-04-26 DIAGNOSIS — B009 Herpesviral infection, unspecified: Secondary | ICD-10-CM

## 2021-05-06 MED ORDER — VALACYCLOVIR HCL 500 MG PO TABS: 500.0000 mg | ORAL_TABLET | Freq: Every day | ORAL | 11 refills | Status: DC

## 2021-05-09 ENCOUNTER — Ambulatory Visit (HOSPITAL_COMMUNITY)
Admission: EM | Admit: 2021-05-09 | Discharge: 2021-05-09 | Disposition: A | Discharge status: Home / Self Care | Payer: 59 | Attending: Family Medicine | Admitting: Family Medicine

## 2021-05-09 ENCOUNTER — Encounter (HOSPITAL_COMMUNITY): Payer: Self-pay

## 2021-05-09 DIAGNOSIS — J069 Acute upper respiratory infection, unspecified: Secondary | ICD-10-CM | POA: Diagnosis not present

## 2021-05-09 DIAGNOSIS — K5900 Constipation, unspecified: Secondary | ICD-10-CM | POA: Diagnosis not present

## 2021-05-09 DIAGNOSIS — B309 Viral conjunctivitis, unspecified: Secondary | ICD-10-CM | POA: Insufficient documentation

## 2021-05-09 DIAGNOSIS — Z79899 Other long term (current) drug therapy: Secondary | ICD-10-CM | POA: Insufficient documentation

## 2021-05-09 DIAGNOSIS — R059 Cough, unspecified: Secondary | ICD-10-CM | POA: Diagnosis not present

## 2021-05-09 DIAGNOSIS — Z20822 Contact with and (suspected) exposure to covid-19: Secondary | ICD-10-CM | POA: Diagnosis not present

## 2021-05-09 LAB — SARS CORONAVIRUS 2 (TAT 6-24 HRS): SARS Coronavirus 2: NEGATIVE

## 2021-05-09 MED ORDER — POLYETHYLENE GLYCOL 3350 17 GM/SCOOP PO POWD: 1.0000 | Freq: Once | ORAL | 0 refills | Status: AC

## 2021-05-09 MED ORDER — GUAIFENESIN ER 1200 MG PO TB12: 1200.0000 mg | ORAL_TABLET | Freq: Two times a day (BID) | ORAL | 0 refills | Status: DC

## 2021-05-09 MED ORDER — ALBUTEROL SULFATE HFA 108 (90 BASE) MCG/ACT IN AERS: 1.0000 | INHALATION_SPRAY | Freq: Four times a day (QID) | RESPIRATORY_TRACT | 0 refills | Status: DC | PRN

## 2021-05-09 MED ORDER — PROMETHAZINE-DM 6.25-15 MG/5ML PO SYRP: 5.0000 mL | ORAL_SOLUTION | Freq: Four times a day (QID) | ORAL | 0 refills | Status: DC | PRN

## 2021-05-09 MED ORDER — SENNA 8.6 MG PO TABS: 2.0000 | ORAL_TABLET | Freq: Every day | ORAL | 0 refills | Status: DC

## 2021-05-09 MED ORDER — IBUPROFEN 600 MG PO TABS: 600.0000 mg | ORAL_TABLET | Freq: Four times a day (QID) | ORAL | 0 refills | Status: DC | PRN

## 2021-05-09 NOTE — Discharge Instructions (Addendum)
You were seen today for an upper respiratory infection that I suspect is viral.  Your COVID-19 test results will be back in the next 24 to 48 hours.  You will receive a phone call from [place] if you are positive for COVID-19.  Please quarantine until you receive your results. ? ?I also believe that your pinkeye or conjunctivitis is viral in nature.  Please use warm compresses to your eyes in the mornings to reduce the drainage and keep them from getting stuck shut.  If the drainage continues throughout the day, then return and we will do antibiotic eyedrops at that time. ? ?I have prescribed medications to help with your symptoms in the meantime.  I prescribed an albuterol inhaler for you to take 1 to 2 puffs every 6 hours for wheezing and shortness of breath.  Please wash out your mouth after you use this medicine. ?I have prescribed MiraLAX for your constipation.  Please take this twice daily until you go to the bathroom and have a bowel movement, then use senna pills at night to prevent constipation in the future.  It is essential that you drink plenty of water when you take these medications because they will not work unless your body has plenty of water and your system. ?I have also prescribed guaifenesin for you to take while you are sick to thin your mucus to better be able to cough it up.  Same as with the medication for constipation, you need to drink plenty of water while you take this medication or else it will not work in your body. ?You may take Tylenol or ibuprofen as needed for body aches and fever.  You may also take these medications for your headache at home. ?I have also prescribed Promethazine DM cough syrup for you to take at night to help with your cough.  Please take half a dose of this so that you are not too sleepy to be able to get up with your son in the middle of the night.  However, this will help you get some sleep and relieve your cough. ? ?I hope you feel better!  Please follow-up with  your primary care provider for further management and evaluation of your symptoms.  Return here or go to the emergency department for new or worsening symptoms. ?

## 2021-05-09 NOTE — ED Triage Notes (Addendum)
Patient presents to Urgent Care with complaints of headache, cough, congestion, sore throat  since sunday. Patient reports no otc medication.  ?Pt st grandmother tested pos for ovid recently would like covid test for her and son ?

## 2021-05-09 NOTE — ED Provider Notes (Signed)
?[place] ? ? ? ?CSN: [id number] ?Arrival date & time: 05/09/21  0945 ? ? ?  ? ?History   ?Chief Complaint ?Chief Complaint  ?Patient presents with  ? Cough  ? ? ?HPI ?Kelli Mcpherson is a 26 y.o. female.  ? ?Patient presents to urgent care for evaluation of cough, sore throat, and nasal congestion. Reports green mucous with cough and headache, but she attributes this to congestion. States her eyes were also "swollen shut" this morning and states she got in the shower to wipe them and the "eye buggers" went away. Patient states she just got back from a cruise on Monday April 17th, 2023. Cough started the day they got back from the cruise and has gotten progressively worse. Head pain is 6 on a scale of 0-10 at this time. Denies nausea, vomiting, and diarrhea at this time. Reports small amount of abdominal cramping and constipation. Last bowel movement was Monday April 17th, 2023. Also reports small amount of lightheadedness on Monday the 17th, but states this resolved on it's own and is not present today.  Does report shortness of breath with exertion that started 3 days ago.  Denies wheezing.  States she has been drinking plenty of water. Denies medication use at home for supportive care. Denies fever and chills.  Denies other aggravating or relieving factors. States she has had COVID before 2 years ago. She has been vaccinated for COVID, but only got 2 initial doses and no booster.  States she was exposed to [place] by her great-grandmother recently. ? ? ?Cough ? ?Past Medical History:  ?Diagnosis Date  ? Chlamydia   ? COVID-19   ? Eczema   ? Genital herpes   ? Gestational diabetes   ? Gonorrhea   ? Pregnancy induced hypertension   ? ? ?Patient Active Problem List  ? Diagnosis Date Noted  ? Elevated blood pressure reading 10/05/2020  ? Pre-eclampsia 06/08/2019  ? DKA (diabetic ketoacidosis) ([place]) 05/29/2019  ? Vaginal discharge 02/04/2018  ? Costochondral chest pain 10/13/2017  ? Episodic tension-type  headache, not intractable 10/13/2017  ? Obesity 08/01/2015  ? ? ?Past Surgical History:  ?Procedure Laterality Date  ? WISDOM TOOTH EXTRACTION    ? ? ?OB History   ? ? Gravida  ?1  ? Para  ?1  ? Term  ?   ? Preterm  ?1  ? AB  ?   ? Living  ?1  ?  ? ? SAB  ?   ? IAB  ?   ? Ectopic  ?   ? Multiple  ?0  ? Live Births  ?1  ?   ?  ?  ? ? ? ?Home Medications   ? ?Prior to Admission medications   ?Medication Sig Start Date End Date Taking? Authorizing Provider  ?albuterol (VENTOLIN HFA) 108 (90 Base) MCG/ACT inhaler Inhale 1-2 puffs into the lungs every 6 (six) hours as needed for wheezing or shortness of breath. 05/09/21  Yes Talbot Grumbling, FNP  ?Guaifenesin 1200 MG TB12 Take 1 tablet (1,200 mg total) by mouth in the morning and at bedtime. 05/09/21  Yes Talbot Grumbling, FNP  ?ibuprofen (ADVIL) 600 MG tablet Take 1 tablet (600 mg total) by mouth every 6 (six) hours as needed. 05/09/21  Yes Talbot Grumbling, FNP  ?polyethylene glycol powder (MIRALAX) 17 GM/SCOOP powder Take 255 g by mouth once for 1 dose. 05/09/21 05/09/21 Yes Talbot Grumbling, FNP  ?promethazine-dextromethorphan (PROMETHAZINE-DM) 6.25-15 MG/5ML syrup Take 5  mLs by mouth 4 (four) times daily as needed for cough. 05/09/21  Yes Talbot Grumbling, FNP  ?senna (SENOKOT) 8.6 MG TABS tablet Take 2 tablets (17.2 mg total) by mouth daily. 05/09/21  Yes Talbot Grumbling, FNP  ?acetaminophen (TYLENOL) 500 MG tablet Take 500 mg by mouth every 6 (six) hours as needed. ?Patient not taking: No sig reported    [provider]  ?fluconazole (DIFLUCAN) 150 MG tablet Take one tab PO x 1 dose now, may repeat in 72 hours if needed ?Patient not taking: Reported on 05/09/2021 02/10/21   Geryl Councilman L, PA  ?furosemide (LASIX) 20 MG tablet Take 1 tablet (20 mg total) by mouth daily for 10 days. ?Patient not taking: Reported on 05/09/2021 06/20/19 06/30/19  Nugent, Gerrie Nordmann, NP  ?glucose blood test strip Check your sugar in the morning before you  eat breakfast, and one hour after a meal. ?Patient not taking: Reported on 05/09/2021 04/07/20   Melynda Ripple, MD  ?glucose monitoring kit (FREESTYLE) monitoring kit 1 each by Does not apply route daily. Check glucose once in the morning before breakfast and 1 hour after a meal ?Patient not taking: Reported on 05/09/2021 04/07/20   Melynda Ripple, MD  ?metFORMIN (GLUCOPHAGE) 500 MG tablet Take 1 tablet (500 mg total) by mouth 2 (two) times daily with a meal. 09/06/20   Rasch, Artist Pais, NP  ?Norethindrone Acetate-Ethinyl Estrad-FE (LOESTRIN 24 FE) 1-20 MG-MCG(24) tablet Take 1 tablet by mouth daily. ?Patient not taking: Reported on 05/09/2021 10/09/20   Clarnce Flock, MD  ?valACYclovir (VALTREX) 500 MG tablet Take 1 tablet (500 mg total) by mouth daily. ?Patient not taking: Reported on 05/09/2021 05/06/21   Donnamae Jude, MD  ?enalapril (VASOTEC) 10 MG tablet Take 1 tablet (10 mg total) by mouth daily. 06/20/19 06/27/20  Nugent, Gerrie Nordmann, NP  ? ? ?Family History ?Family History  ?Problem Relation Age of Onset  ? Diabetes Mother   ? Hypertension Mother   ? Hypertension Father   ? ? ?Social History ?Social History  ? ?Tobacco Use  ? Smoking status: Former  ?  Types: Cigars  ? Smokeless tobacco: Former  ? Tobacco comments:  ?  Patient occasionally smokes hookah   ?Vaping Use  ? Vaping Use: Never used  ?Substance Use Topics  ? Alcohol use: Not Currently  ?  Comment: socially  ? Drug use: Never  ? ? ? ?Allergies   ?Patient has no known allergies. ? ? ?Review of Systems ?Review of Systems  ?Respiratory:  Positive for cough.   ? ? ?Physical Exam ?Triage Vital Signs ?ED Triage Vitals  ?Enc Vitals Group  ?   BP 05/09/21 1031 133/77  ?   Pulse Rate 05/09/21 1031 87  ?   Resp 05/09/21 1030 18  ?   Temp 05/09/21 1030 98.2 ?F (36.8 ?C)  ?   Temp src --   ?   SpO2 05/09/21 1030 95 %  ?   Weight --   ?   Height --   ?   Head Circumference --   ?   Peak Flow --   ?   Pain Score 05/09/21 1026 6  ?   Pain Loc --   ?   Pain Edu? --    ?   Excl. in [place]? --   ? ?No data found. ? ?Updated Vital Signs ?BP 133/77   Pulse 87   Temp 98.2 ?F (36.8 ?C)   Resp 18  SpO2 95%  ? ?Visual Acuity ?Right Eye Distance:   ?Left Eye Distance:   ?Bilateral Distance:   ? ?Right Eye Near:   ?Left Eye Near:    ?Bilateral Near:    ? ?Physical Exam ?Vitals and nursing note reviewed.  ?Constitutional:   ?   General: She is not in acute distress. ?   Appearance: Normal appearance. She is well-developed. She is not ill-appearing.  ?HENT:  ?   Head: Normocephalic and atraumatic.  ?   Right Ear: Tympanic membrane, ear canal and external ear normal.  ?   Left Ear: Tympanic membrane, ear canal and external ear normal.  ?   Nose: Nose normal.  ?   Mouth/Throat:  ?   Lips: Pink.  ?   Mouth: Mucous membranes are moist.  ?Eyes:  ?   General: Lids are normal. Vision grossly intact. No visual field deficit. ?   Extraocular Movements: Extraocular movements intact.  ?   Conjunctiva/sclera:  ?   Right eye: Right conjunctiva is injected. Exudate present.  ?   Left eye: Left conjunctiva is injected. Exudate present.  ?   Comments: Small amount of white exudate from bilateral eyes.  Patient states this is worse in the morning and goes away with warm water compresses.  ?Cardiovascular:  ?   Rate and Rhythm: Normal rate and regular rhythm.  ?   Pulses: Normal pulses.  ?   Heart sounds: Normal heart sounds, S1 normal and S2 normal. No murmur heard. ?  No friction rub. No gallop.  ?   Comments: Benign cardiac exam ?Pulmonary:  ?   Effort: Pulmonary effort is normal. No respiratory distress.  ?   Breath sounds: Examination of the right-lower field reveals wheezing. Examination of the left-lower field reveals wheezing. Wheezing present. No rhonchi or rales.  ?   Comments: Lung sounds clear other than some expiratory wheezes to the left and right lower lobes. ?Chest:  ?   Chest wall: No tenderness.  ?Abdominal:  ?   General: Abdomen is protuberant. Bowel sounds are normal. There is no  distension.  ?   Palpations: Abdomen is soft. There is no mass.  ?   Tenderness: There is no abdominal tenderness. There is no right CVA tenderness or left CVA tenderness.  ?Musculoskeletal:     ?   General: No

## 2021-05-12 ENCOUNTER — Ambulatory Visit (HOSPITAL_COMMUNITY)
Admission: EM | Admit: 2021-05-12 | Discharge: 2021-05-12 | Disposition: A | Discharge status: Home / Self Care | Payer: 59 | Attending: Internal Medicine | Admitting: Internal Medicine

## 2021-05-12 DIAGNOSIS — B9689 Other specified bacterial agents as the cause of diseases classified elsewhere: Secondary | ICD-10-CM

## 2021-05-12 DIAGNOSIS — J069 Acute upper respiratory infection, unspecified: Secondary | ICD-10-CM | POA: Diagnosis not present

## 2021-05-12 DIAGNOSIS — H109 Unspecified conjunctivitis: Secondary | ICD-10-CM | POA: Diagnosis not present

## 2021-05-12 DIAGNOSIS — R051 Acute cough: Secondary | ICD-10-CM

## 2021-05-12 MED ORDER — POLYMYXIN B-TRIMETHOPRIM 10000-0.1 UNIT/ML-% OP SOLN: 1.0000 [drp] | OPHTHALMIC | 0 refills | Status: AC

## 2021-05-12 MED ORDER — AZITHROMYCIN 500 MG PO TABS: ORAL_TABLET | ORAL | 0 refills | Status: AC

## 2021-05-12 NOTE — ED Provider Notes (Signed)
?Kelli Mcpherson ? ? ? ?CSN: [id number] ?Arrival date & time: 05/12/21  1000 ? ? ?  ? ?History   ?Chief Complaint ?Chief Complaint  ?Patient presents with  ? Conjunctivitis  ? Wheezing  ? ? ?HPI ?Kelli Mcpherson is a 26 y.o. female.  ? ?Patient presents for further evaluation for persistent symptoms after being seen on 05/09/2021.  Patient reports 1 week history of nasal congestion and cough.  Also reports that she has had some bilateral eye irritation, redness, purulent drainage for a few days as well.  Denies any blurry vision.  Denies purulent drainage from the eyes.  Son has similar eye symptoms.  Patient was prescribed a few medications at previous urgent care visit to help alleviate cough and symptoms but patient reports that these medications have not been helpful.  Denies any associated fever, chest pain, shortness of breath, sore throat, ear pain, nausea, vomiting, diarrhea, abdominal pain.  Her son has similar symptoms currently.  Previous COVID testing at visit was negative. ? ? ?Conjunctivitis ? ?Wheezing ? ?Past Medical History:  ?Diagnosis Date  ? Chlamydia   ? COVID-19   ? Eczema   ? Genital herpes   ? Gestational diabetes   ? Gonorrhea   ? Pregnancy induced hypertension   ? ? ?Patient Active Problem List  ? Diagnosis Date Noted  ? Elevated blood pressure reading 10/05/2020  ? Pre-eclampsia 06/08/2019  ? DKA (diabetic ketoacidosis) ([place]) 05/29/2019  ? Vaginal discharge 02/04/2018  ? Costochondral chest pain 10/13/2017  ? Episodic tension-type headache, not intractable 10/13/2017  ? Obesity 08/01/2015  ? ? ?Past Surgical History:  ?Procedure Laterality Date  ? WISDOM TOOTH EXTRACTION    ? ? ?OB History   ? ? Gravida  ?1  ? Para  ?1  ? Term  ?   ? Preterm  ?1  ? AB  ?   ? Living  ?1  ?  ? ? SAB  ?   ? IAB  ?   ? Ectopic  ?   ? Multiple  ?0  ? Live Births  ?1  ?   ?  ?  ? ? ? ?Home Medications   ? ?Prior to Admission medications   ?Medication Sig Start Date End Date Taking? Authorizing Provider   ?azithromycin (ZITHROMAX) 500 MG tablet Take 1 tablet (500 mg total) by mouth daily for 1 day, THEN 0.5 tablets (250 mg total) daily for 4 days. 05/12/21 05/17/21 Yes Elex Mainwaring, Michele Rockers, FNP  ?trimethoprim-polymyxin b (POLYTRIM) ophthalmic solution Place 1 drop into both eyes every 4 (four) hours for 7 days. 05/12/21 05/19/21 Yes Teodora Medici, FNP  ?acetaminophen (TYLENOL) 500 MG tablet Take 500 mg by mouth every 6 (six) hours as needed. ?Patient not taking: No sig reported    [provider]  ?albuterol (VENTOLIN HFA) 108 (90 Base) MCG/ACT inhaler Inhale 1-2 puffs into the lungs every 6 (six) hours as needed for wheezing or shortness of breath. 05/09/21   Talbot Grumbling, FNP  ?fluconazole (DIFLUCAN) 150 MG tablet Take one tab PO x 1 dose now, may repeat in 72 hours if needed ?Patient not taking: Reported on 05/09/2021 02/10/21   Geryl Councilman L, PA  ?furosemide (LASIX) 20 MG tablet Take 1 tablet (20 mg total) by mouth daily for 10 days. ?Patient not taking: Reported on 05/09/2021 06/20/19 06/30/19  Nugent, Gerrie Nordmann, NP  ?glucose blood test strip Check your sugar in the morning before you eat breakfast, and one hour after  a meal. ?Patient not taking: Reported on 05/09/2021 04/07/20   Melynda Ripple, MD  ?glucose monitoring kit (FREESTYLE) monitoring kit 1 each by Does not apply route daily. Check glucose once in the morning before breakfast and 1 hour after a meal ?Patient not taking: Reported on 05/09/2021 04/07/20   Melynda Ripple, MD  ?Guaifenesin 1200 MG TB12 Take 1 tablet (1,200 mg total) by mouth in the morning and at bedtime. 05/09/21   Talbot Grumbling, FNP  ?ibuprofen (ADVIL) 600 MG tablet Take 1 tablet (600 mg total) by mouth every 6 (six) hours as needed. 05/09/21   Talbot Grumbling, FNP  ?metFORMIN (GLUCOPHAGE) 500 MG tablet Take 1 tablet (500 mg total) by mouth 2 (two) times daily with a meal. 09/06/20   Rasch, Artist Pais, NP  ?Norethindrone Acetate-Ethinyl Estrad-FE (LOESTRIN 24 FE) 1-20  MG-MCG(24) tablet Take 1 tablet by mouth daily. ?Patient not taking: Reported on 05/09/2021 10/09/20   Clarnce Flock, MD  ?promethazine-dextromethorphan (PROMETHAZINE-DM) 6.25-15 MG/5ML syrup Take 5 mLs by mouth 4 (four) times daily as needed for cough. 05/09/21   Talbot Grumbling, FNP  ?senna (SENOKOT) 8.6 MG TABS tablet Take 2 tablets (17.2 mg total) by mouth daily. 05/09/21   Talbot Grumbling, FNP  ?valACYclovir (VALTREX) 500 MG tablet Take 1 tablet (500 mg total) by mouth daily. ?Patient not taking: Reported on 05/09/2021 05/06/21   Donnamae Jude, MD  ?enalapril (VASOTEC) 10 MG tablet Take 1 tablet (10 mg total) by mouth daily. 06/20/19 06/27/20  Nugent, Gerrie Nordmann, NP  ? ? ?Family History ?Family History  ?Problem Relation Age of Onset  ? Diabetes Mother   ? Hypertension Mother   ? Hypertension Father   ? ? ?Social History ?Social History  ? ?Tobacco Use  ? Smoking status: Former  ?  Types: Cigars  ? Smokeless tobacco: Former  ? Tobacco comments:  ?  Patient occasionally smokes hookah   ?Vaping Use  ? Vaping Use: Never used  ?Substance Use Topics  ? Alcohol use: Not Currently  ?  Comment: socially  ? Drug use: Never  ? ? ? ?Allergies   ?Patient has no known allergies. ? ? ?Review of Systems ?Review of Systems ?Per HPI ? ?Physical Exam ?Triage Vital Signs ?ED Triage Vitals  ?Enc Vitals Group  ?   BP 05/12/21 1045 134/87  ?   Pulse --   ?   Resp 05/12/21 1045 16  ?   Temp --   ?   Temp src --   ?   SpO2 05/12/21 1045 96 %  ?   Weight --   ?   Height --   ?   Head Circumference --   ?   Peak Flow --   ?   Pain Score 05/12/21 1022 0  ?   Pain Loc --   ?   Pain Edu? --   ?   Excl. in [place]? --   ? ?No data found. ? ?Updated Vital Signs ?BP 134/87 (BP Location: Right Arm)   Resp 16   SpO2 96%  ? ?Visual Acuity ?Right Eye Distance: 20/40 ?Left Eye Distance: 20/30 ?Bilateral Distance:   ? ?Right Eye Near: R Near: 20/40 ?Left Eye Near:  L Near: 20/30 ?Bilateral Near:    ? ?Physical Exam ?Constitutional:   ?    General: She is not in acute distress. ?   Appearance: Normal appearance. She is not toxic-appearing or diaphoretic.  ?HENT:  ?   Head:  Normocephalic and atraumatic.  ?   Right Ear: Tympanic membrane and ear canal normal.  ?   Left Ear: Tympanic membrane and ear canal normal.  ?   Nose: Congestion present.  ?   Mouth/Throat:  ?   Mouth: Mucous membranes are moist.  ?   Pharynx: No posterior oropharyngeal erythema.  ?Eyes:  ?   General: Lids are normal. Lids are everted, no foreign bodies appreciated. Vision grossly intact. Gaze aligned appropriately.  ?   Extraocular Movements: Extraocular movements intact.  ?   Conjunctiva/sclera:  ?   Right eye: Right conjunctiva is injected. Exudate present. No chemosis or hemorrhage. ?   Left eye: Left conjunctiva is injected. Exudate present. No chemosis or hemorrhage. ?   Pupils: Pupils are equal, round, and reactive to light.  ?Cardiovascular:  ?   Rate and Rhythm: Normal rate and regular rhythm.  ?   Pulses: Normal pulses.  ?   Heart sounds: Normal heart sounds.  ?Pulmonary:  ?   Effort: Pulmonary effort is normal. No respiratory distress.  ?   Breath sounds: Normal breath sounds. No stridor. No wheezing, rhonchi or rales.  ?Abdominal:  ?   General: Abdomen is flat. Bowel sounds are normal.  ?   Palpations: Abdomen is soft.  ?Musculoskeletal:     ?   General: Normal range of motion.  ?   Cervical back: Normal range of motion.  ?Skin: ?   General: Skin is warm and dry.  ?Neurological:  ?   General: No focal deficit present.  ?   Mental Status: She is alert and oriented to person, place, and time. Mental status is at baseline.  ?Psychiatric:     ?   Mood and Affect: Mood normal.     ?   Behavior: Behavior normal.  ? ? ? ?UC Treatments / Results  ?Labs ?(all labs ordered are listed, but only abnormal results are displayed) ?Labs Reviewed - No data to display ? ?EKG ? ? ?Radiology ?No results found. ? ?Procedures ?Procedures (including critical care time) ? ?Medications Ordered  in UC ?Medications - No data to display ? ?Initial Impression / Assessment and Plan / UC Course  ?I have reviewed the triage vital signs and the nursing notes. ? ?Pertinent labs & imaging results that were ava

## 2021-05-12 NOTE — ED Triage Notes (Signed)
Pt presents to urgent care with eye pain,itching and redness. She was seen on 04/20 with the same symptoms. She reports nothing was done for her on 04/20.  ?

## 2021-05-12 NOTE — Discharge Instructions (Signed)
You have been prescribed an antibiotic for your symptoms as well as antibiotic eyedrops for your eye symptoms.  Please follow-up with eye doctor if eye symptoms persist or worsen. You may continue the cough medications and medications that were prescribed at previous urgent care visit as well to help alleviate your symptoms.  Please ensure adequate fluid hydration.  Follow-up if symptoms persist or worsen. ?

## 2021-05-15 ENCOUNTER — Ambulatory Visit
Admission: RE | Admit: 2021-05-15 | Discharge: 2021-05-15 | Disposition: A | Discharge status: Home / Self Care | Payer: 59 | Source: Ambulatory Visit | Attending: Obstetrics and Gynecology | Admitting: Obstetrics and Gynecology

## 2021-05-15 VITALS — BP 137/84 | HR 83 | Temp 98.3°F | Resp 18 | Ht 71.0 in | Wt 300.0 lb

## 2021-05-15 DIAGNOSIS — H5789 Other specified disorders of eye and adnexa: Secondary | ICD-10-CM | POA: Diagnosis not present

## 2021-05-15 NOTE — Discharge Instructions (Signed)
Please call provided contact information for ophthalmology for further evaluation and management. ?

## 2021-05-15 NOTE — ED Provider Notes (Signed)
?[facility] ? ? ? ?CSN: [id number] ?Arrival date & time: 05/15/21  1601 ? ? ?  ? ?History   ?Chief Complaint ?Chief Complaint  ?Patient presents with  ? Wheezing  ?  Eye infection - Entered by patient  ? Appointment  ? ? ?HPI ?Kelli Mcpherson is a 26 y.o. female.  ? ?Patient presents with bilateral eye itchiness, irritation, drainage that started approximately 2 weeks ago.  Patient was seen in urgent care a few days prior and was prescribed antibiotic eyedrops for bacterial conjunctivitis.  She reports that she has seen improvement in eye redness but still has associated crustiness and itchiness of eyes.  Patient does not wear contacts or glasses.  Denies trauma or foreign body to the eyes.  Denies any blurry vision. ? ? ?Wheezing ? ?Past Medical History:  ?Diagnosis Date  ? Chlamydia   ? COVID-19   ? Eczema   ? Genital herpes   ? Gestational diabetes   ? Gonorrhea   ? Pregnancy induced hypertension   ? ? ?Patient Active Problem List  ? Diagnosis Date Noted  ? Elevated blood pressure reading 10/05/2020  ? Pre-eclampsia 06/08/2019  ? DKA (diabetic ketoacidosis) ([place]) 05/29/2019  ? Vaginal discharge 02/04/2018  ? Costochondral chest pain 10/13/2017  ? Episodic tension-type headache, not intractable 10/13/2017  ? Obesity 08/01/2015  ? ? ?Past Surgical History:  ?Procedure Laterality Date  ? WISDOM TOOTH EXTRACTION    ? ? ?OB History   ? ? Gravida  ?1  ? Para  ?1  ? Term  ?   ? Preterm  ?1  ? AB  ?   ? Living  ?1  ?  ? ? SAB  ?   ? IAB  ?   ? Ectopic  ?   ? Multiple  ?0  ? Live Births  ?1  ?   ?  ?  ? ? ? ?Home Medications   ? ?Prior to Admission medications   ?Medication Sig Start Date End Date Taking? Authorizing Provider  ?albuterol (VENTOLIN HFA) 108 (90 Base) MCG/ACT inhaler Inhale 1-2 puffs into the lungs every 6 (six) hours as needed for wheezing or shortness of breath. 05/09/21  Yes Talbot Grumbling, FNP  ?azithromycin (ZITHROMAX) 500 MG tablet Take 1 tablet (500 mg total) by mouth daily for 1  day, THEN 0.5 tablets (250 mg total) daily for 4 days. 05/12/21 05/17/21 Yes Teodora Medici, FNP  ?Guaifenesin 1200 MG TB12 Take 1 tablet (1,200 mg total) by mouth in the morning and at bedtime. 05/09/21  Yes Talbot Grumbling, FNP  ?ibuprofen (ADVIL) 600 MG tablet Take 1 tablet (600 mg total) by mouth every 6 (six) hours as needed. 05/09/21  Yes Talbot Grumbling, FNP  ?metFORMIN (GLUCOPHAGE) 500 MG tablet Take 1 tablet (500 mg total) by mouth 2 (two) times daily with a meal. 09/06/20  Yes Rasch, Artist Pais, NP  ?promethazine-dextromethorphan (PROMETHAZINE-DM) 6.25-15 MG/5ML syrup Take 5 mLs by mouth 4 (four) times daily as needed for cough. 05/09/21  Yes Talbot Grumbling, FNP  ?senna (SENOKOT) 8.6 MG TABS tablet Take 2 tablets (17.2 mg total) by mouth daily. 05/09/21  Yes Talbot Grumbling, FNP  ?trimethoprim-polymyxin b (POLYTRIM) ophthalmic solution Place 1 drop into both eyes every 4 (four) hours for 7 days. 05/12/21 05/19/21 Yes Teodora Medici, FNP  ?acetaminophen (TYLENOL) 500 MG tablet Take 500 mg by mouth every 6 (six) hours as needed. ?Patient not taking: No sig reported  [provider]  ?fluconazole (DIFLUCAN) 150 MG tablet Take one tab PO x 1 dose now, may repeat in 72 hours if needed ?Patient not taking: Reported on 05/09/2021 02/10/21   Geryl Councilman L, PA  ?furosemide (LASIX) 20 MG tablet Take 1 tablet (20 mg total) by mouth daily for 10 days. ?Patient not taking: Reported on 05/09/2021 06/20/19 06/30/19  Nugent, Gerrie Nordmann, NP  ?glucose blood test strip Check your sugar in the morning before you eat breakfast, and one hour after a meal. ?Patient not taking: Reported on 05/09/2021 04/07/20   Melynda Ripple, MD  ?glucose monitoring kit (FREESTYLE) monitoring kit 1 each by Does not apply route daily. Check glucose once in the morning before breakfast and 1 hour after a meal ?Patient not taking: Reported on 05/09/2021 04/07/20   Melynda Ripple, MD  ?Norethindrone Acetate-Ethinyl Estrad-FE  (LOESTRIN 24 FE) 1-20 MG-MCG(24) tablet Take 1 tablet by mouth daily. ?Patient not taking: Reported on 05/09/2021 10/09/20   Clarnce Flock, MD  ?valACYclovir (VALTREX) 500 MG tablet Take 1 tablet (500 mg total) by mouth daily. ?Patient not taking: Reported on 05/09/2021 05/06/21   Donnamae Jude, MD  ?enalapril (VASOTEC) 10 MG tablet Take 1 tablet (10 mg total) by mouth daily. 06/20/19 06/27/20  Nugent, Gerrie Nordmann, NP  ? ? ?Family History ?Family History  ?Problem Relation Age of Onset  ? Diabetes Mother   ? Hypertension Mother   ? Hypertension Father   ? ? ?Social History ?Social History  ? ?Tobacco Use  ? Smoking status: Former  ?  Types: Cigars  ? Smokeless tobacco: Former  ? Tobacco comments:  ?  Patient occasionally smokes hookah   ?Vaping Use  ? Vaping Use: Never used  ?Substance Use Topics  ? Alcohol use: Not Currently  ?  Comment: socially  ? Drug use: Never  ? ? ? ?Allergies   ?Patient has no known allergies. ? ? ?Review of Systems ?Review of Systems ?Per HPI ? ?Physical Exam ?Triage Vital Signs ?ED Triage Vitals  ?Enc Vitals Group  ?   BP 05/15/21 1613 137/84  ?   Pulse Rate 05/15/21 1613 83  ?   Resp 05/15/21 1613 18  ?   Temp 05/15/21 1613 98.3 ?F (36.8 ?C)  ?   Temp Source 05/15/21 1613 Oral  ?   SpO2 05/15/21 1613 94 %  ?   Weight 05/15/21 1615 300 lb (136.1 kg)  ?   Height 05/15/21 1615 _0  (1.803 m)  ?   Head Circumference --   ?   Peak Flow --   ?   Pain Score 05/15/21 1615 0  ?   Pain Loc --   ?   Pain Edu? --   ?   Excl. in [place]? --   ? ?No data found. ? ?Updated Vital Signs ?BP 137/84 (BP Location: Left Arm)   Pulse 83   Temp 98.3 ?F (36.8 ?C) (Oral)   Resp 18   Ht _1  (1.803 m)   Wt 300 lb (136.1 kg)   SpO2 94%   Breastfeeding No   BMI 41.84 kg/m?  ? ?Visual Acuity ?Right Eye Distance: 20 25 ?Left Eye Distance: 20 30 ?Bilateral Distance: 20 25 ? ?Right Eye Near:   ?Left Eye Near:    ?Bilateral Near:    ? ?Physical Exam ?Constitutional:   ?   General: She is not in acute distress. ?    Appearance: Normal appearance. She is not toxic-appearing or diaphoretic.  ?  HENT:  ?   Head: Normocephalic and atraumatic.  ?Eyes:  ?   General: Lids are normal. Lids are everted, no foreign bodies appreciated. Vision grossly intact. Gaze aligned appropriately.  ?   Extraocular Movements: Extraocular movements intact.  ?   Conjunctiva/sclera:  ?   Right eye: Right conjunctiva is injected. No chemosis, exudate or hemorrhage. ?   Left eye: Left conjunctiva is injected. No chemosis, exudate or hemorrhage. ?   Pupils: Pupils are equal, round, and reactive to light.  ?Pulmonary:  ?   Effort: Pulmonary effort is normal.  ?Neurological:  ?   General: No focal deficit present.  ?   Mental Status: She is alert and oriented to person, place, and time. Mental status is at baseline.  ?Psychiatric:     ?   Mood and Affect: Mood normal.     ?   Behavior: Behavior normal.     ?   Thought Content: Thought content normal.     ?   Judgment: Judgment normal.  ? ? ? ?UC Treatments / Results  ?Labs ?(all labs ordered are listed, but only abnormal results are displayed) ?Labs Reviewed - No data to display ? ?EKG ? ? ?Radiology ?No results found. ? ?Procedures ?Procedures (including critical care time) ? ?Medications Ordered in UC ?Medications - No data to display ? ?Initial Impression / Assessment and Plan / UC Course  ?I have reviewed the triage vital signs and the nursing notes. ? ?Pertinent labs & imaging results that were available during my care of the patient were reviewed by me and considered in my medical decision making (see chart for details). ? ?  ? ?Visual acuity appears normal.  Patient was advised that she will need to see ophthalmology for further evaluation and management given persistent eye symptoms.  Patient was agreeable with plan.  Provided patient with contact information for on-call ophthalmology to contact to make an appointment.  Discussed return precautions.  Patient verbalized understanding and was agreeable  with plan. ?Final Clinical Impressions(s) / UC Diagnoses  ? ?Final diagnoses:  ?Eye irritation  ? ? ? ?Discharge Instructions   ? ?  ?Please call provided contact information for ophthalmology for further evalua

## 2021-05-15 NOTE — ED Triage Notes (Signed)
Patient c/o continued bilateral eye itchiness and pain for x 2 weeks.  No blurred vision.  No corrective wear.  Patient is currently taken eye drops w/o relief. ?

## 2021-07-03 ENCOUNTER — Encounter (HOSPITAL_COMMUNITY): Payer: Self-pay | Admitting: Obstetrics & Gynecology

## 2021-07-03 ENCOUNTER — Encounter (HOSPITAL_COMMUNITY): Payer: Self-pay

## 2021-07-03 ENCOUNTER — Ambulatory Visit (INDEPENDENT_AMBULATORY_CARE_PROVIDER_SITE_OTHER)
Admission: EM | Admit: 2021-07-03 | Discharge: 2021-07-03 | Disposition: A | Discharge status: Home / Self Care | Payer: 59 | Source: Home / Self Care

## 2021-07-03 ENCOUNTER — Inpatient Hospital Stay (HOSPITAL_COMMUNITY)
Admission: AD | Admit: 2021-07-03 | Discharge: 2021-07-03 | Disposition: A | Discharge status: Home / Self Care | Payer: 59 | Attending: Obstetrics & Gynecology | Admitting: Obstetrics & Gynecology

## 2021-07-03 ENCOUNTER — Inpatient Hospital Stay (HOSPITAL_COMMUNITY): Payer: 59

## 2021-07-03 DIAGNOSIS — R1030 Lower abdominal pain, unspecified: Secondary | ICD-10-CM | POA: Diagnosis not present

## 2021-07-03 DIAGNOSIS — O98311 Other infections with a predominantly sexual mode of transmission complicating pregnancy, first trimester: Secondary | ICD-10-CM | POA: Insufficient documentation

## 2021-07-03 DIAGNOSIS — R81 Glycosuria: Secondary | ICD-10-CM

## 2021-07-03 DIAGNOSIS — O24111 Pre-existing diabetes mellitus, type 2, in pregnancy, first trimester: Secondary | ICD-10-CM

## 2021-07-03 DIAGNOSIS — Z3A01 Less than 8 weeks gestation of pregnancy: Secondary | ICD-10-CM | POA: Diagnosis not present

## 2021-07-03 DIAGNOSIS — O23592 Infection of other part of genital tract in pregnancy, second trimester: Secondary | ICD-10-CM | POA: Diagnosis not present

## 2021-07-03 DIAGNOSIS — O24112 Pre-existing diabetes mellitus, type 2, in pregnancy, second trimester: Secondary | ICD-10-CM | POA: Diagnosis not present

## 2021-07-03 DIAGNOSIS — R739 Hyperglycemia, unspecified: Secondary | ICD-10-CM | POA: Diagnosis not present

## 2021-07-03 DIAGNOSIS — E119 Type 2 diabetes mellitus without complications: Secondary | ICD-10-CM | POA: Insufficient documentation

## 2021-07-03 DIAGNOSIS — O26891 Other specified pregnancy related conditions, first trimester: Secondary | ICD-10-CM | POA: Diagnosis not present

## 2021-07-03 DIAGNOSIS — O98811 Other maternal infectious and parasitic diseases complicating pregnancy, first trimester: Secondary | ICD-10-CM | POA: Diagnosis not present

## 2021-07-03 DIAGNOSIS — Z3201 Encounter for pregnancy test, result positive: Secondary | ICD-10-CM

## 2021-07-03 DIAGNOSIS — O09292 Supervision of pregnancy with other poor reproductive or obstetric history, second trimester: Secondary | ICD-10-CM | POA: Diagnosis not present

## 2021-07-03 DIAGNOSIS — Z3A09 9 weeks gestation of pregnancy: Secondary | ICD-10-CM | POA: Insufficient documentation

## 2021-07-03 DIAGNOSIS — A6004 Herpesviral vulvovaginitis: Secondary | ICD-10-CM | POA: Diagnosis not present

## 2021-07-03 DIAGNOSIS — O26899 Other specified pregnancy related conditions, unspecified trimester: Secondary | ICD-10-CM

## 2021-07-03 DIAGNOSIS — O10919 Unspecified pre-existing hypertension complicating pregnancy, unspecified trimester: Secondary | ICD-10-CM | POA: Diagnosis not present

## 2021-07-03 DIAGNOSIS — O3680X0 Pregnancy with inconclusive fetal viability, not applicable or unspecified: Secondary | ICD-10-CM

## 2021-07-03 DIAGNOSIS — N989 Complication associated with artificial fertilization, unspecified: Secondary | ICD-10-CM | POA: Insufficient documentation

## 2021-07-03 DIAGNOSIS — N898 Other specified noninflammatory disorders of vagina: Secondary | ICD-10-CM

## 2021-07-03 DIAGNOSIS — Z79899 Other long term (current) drug therapy: Secondary | ICD-10-CM | POA: Diagnosis not present

## 2021-07-03 DIAGNOSIS — E1165 Type 2 diabetes mellitus with hyperglycemia: Secondary | ICD-10-CM | POA: Diagnosis not present

## 2021-07-03 DIAGNOSIS — Z794 Long term (current) use of insulin: Secondary | ICD-10-CM | POA: Diagnosis not present

## 2021-07-03 DIAGNOSIS — O10911 Unspecified pre-existing hypertension complicating pregnancy, first trimester: Secondary | ICD-10-CM | POA: Diagnosis not present

## 2021-07-03 DIAGNOSIS — B3731 Acute candidiasis of vulva and vagina: Secondary | ICD-10-CM | POA: Diagnosis not present

## 2021-07-03 DIAGNOSIS — O98512 Other viral diseases complicating pregnancy, second trimester: Secondary | ICD-10-CM | POA: Insufficient documentation

## 2021-07-03 DIAGNOSIS — B009 Herpesviral infection, unspecified: Secondary | ICD-10-CM

## 2021-07-03 HISTORY — DX: Type 2 diabetes mellitus without complications: E11.9

## 2021-07-03 LAB — URINALYSIS, MICROSCOPIC (REFLEX)

## 2021-07-03 LAB — CBC
HCT: 39.7 % (ref 36.0–46.0)
Hemoglobin: 12.9 g/dL (ref 12.0–15.0)
MCH: 28.6 pg (ref 26.0–34.0)
MCHC: 32.5 g/dL (ref 30.0–36.0)
MCV: 88 fL (ref 80.0–100.0)
Platelets: 218 10*3/uL (ref 150–400)
RBC: 4.51 MIL/uL (ref 3.87–5.11)
RDW: 13.2 % (ref 11.5–15.5)
WBC: 5.7 10*3/uL (ref 4.0–10.5)
nRBC: 0 % (ref 0.0–0.2)

## 2021-07-03 LAB — COMPREHENSIVE METABOLIC PANEL
ALT: 65 U/L — ABNORMAL HIGH (ref 0–44)
AST: 44 U/L — ABNORMAL HIGH (ref 15–41)
Albumin: 4.1 g/dL (ref 3.5–5.0)
Alkaline Phosphatase: 54 U/L (ref 38–126)
Anion gap: 12 (ref 5–15)
BUN: <5 mg/dL — ABNORMAL LOW (ref 6–20)
CO2: 21 mmol/L — ABNORMAL LOW (ref 22–32)
Calcium: 9.2 mg/dL (ref 8.9–10.3)
Chloride: 103 mmol/L (ref 98–111)
Creatinine, Ser: 0.68 mg/dL (ref 0.44–1.00)
GFR, Estimated: >60 mL/min (ref >60)
Glucose, Bld: 308 mg/dL — ABNORMAL HIGH (ref 70–99)
Potassium: 3.7 mmol/L (ref 3.5–5.1)
Sodium: 136 mmol/L (ref 135–145)
Total Bilirubin: 0.7 mg/dL (ref 0.3–1.2)
Total Protein: 7.3 g/dL (ref 6.5–8.1)

## 2021-07-03 LAB — URINALYSIS, ROUTINE W REFLEX MICROSCOPIC
Bilirubin Urine: NEGATIVE
Glucose, UA: >=500 mg/dL — AB
Ketones, ur: 15 mg/dL — AB
Leukocytes,Ua: NEGATIVE
Nitrite: NEGATIVE
Protein, ur: NEGATIVE mg/dL
Specific Gravity, Urine: 1.01 (ref 1.005–1.030)
pH: 6 (ref 5.0–8.0)

## 2021-07-03 LAB — POCT URINALYSIS DIPSTICK, ED / UC
Bilirubin Urine: NEGATIVE
Glucose, UA: >=1000 mg/dL — AB
Ketones, ur: 15 mg/dL — AB
Leukocytes,Ua: NEGATIVE
Nitrite: NEGATIVE
Protein, ur: NEGATIVE mg/dL
Specific Gravity, Urine: 1.02 (ref 1.005–1.030)
Urobilinogen, UA: 0.2 mg/dL (ref 0.0–1.0)
pH: 5.5 (ref 5.0–8.0)

## 2021-07-03 LAB — POC URINE PREG, ED: Preg Test, Ur: POSITIVE — AB

## 2021-07-03 LAB — BETA-HYDROXYBUTYRIC ACID: Beta-Hydroxybutyric Acid: 0.48 mmol/L — ABNORMAL HIGH (ref 0.05–0.27)

## 2021-07-03 LAB — GLUCOSE, CAPILLARY
Glucose-Capillary: 256 mg/dL — ABNORMAL HIGH (ref 70–99)
Glucose-Capillary: 212 mg/dL — ABNORMAL HIGH (ref 70–99)

## 2021-07-03 LAB — HCG, QUANTITATIVE, PREGNANCY: hCG, Beta Chain, Quant, S: 4232 m[IU]/mL — ABNORMAL HIGH (ref <5)

## 2021-07-03 LAB — HEMOGLOBIN A1C
Hgb A1c MFr Bld: 10.1 % — ABNORMAL HIGH (ref 4.8–5.6)
Mean Plasma Glucose: 243.17 mg/dL

## 2021-07-03 LAB — CBG MONITORING, ED: Glucose-Capillary: 292 mg/dL — ABNORMAL HIGH (ref 70–99)

## 2021-07-03 MED ORDER — INSULIN ASPART 100 UNIT/ML IJ SOLN
15.0000 [IU] | Freq: Once | INTRAMUSCULAR | Status: AC
  Administered 2021-07-03: 15 [IU] via SUBCUTANEOUS

## 2021-07-03 MED ORDER — LABETALOL HCL 100 MG PO TABS: 100.0000 mg | ORAL_TABLET | Freq: Two times a day (BID) | ORAL | 0 refills | Status: DC

## 2021-07-03 MED ORDER — INSULIN NPH (HUMAN) (ISOPHANE) 100 UNIT/ML ~~LOC~~ SUSP: 15.0000 [IU] | Freq: Two times a day (BID) | SUBCUTANEOUS | 0 refills | Status: DC

## 2021-07-03 MED ORDER — VALACYCLOVIR HCL 500 MG PO TABS: 500.0000 mg | ORAL_TABLET | Freq: Two times a day (BID) | ORAL | 10 refills | Status: AC

## 2021-07-03 MED ORDER — INSULIN REGULAR HUMAN 100 UNIT/ML IJ SOLN: 10.0000 [IU] | Freq: Two times a day (BID) | INTRAMUSCULAR | 0 refills | Status: DC

## 2021-07-03 MED ORDER — INSULIN SYRINGE-NEEDLE U-100 29G X 1/2" 0.3 ML MISC
0 refills | Status: DC
Start: 2021-07-03 — End: 2023-06-27

## 2021-07-03 MED ORDER — CVS PRENATAL GUMMY 0.4 MG PO CHEW: 1.0000 [IU] | CHEWABLE_TABLET | Freq: Every day | ORAL | 10 refills | Status: AC

## 2021-07-03 MED ORDER — SODIUM CHLORIDE 0.9 % IV SOLN
INTRAVENOUS | Status: DC
Start: 2021-07-03 — End: 2021-07-03

## 2021-07-03 MED ORDER — TERCONAZOLE 0.4 % VA CREA: 1.0000 | TOPICAL_CREAM | Freq: Every day | VAGINAL | 0 refills | Status: DC

## 2021-07-03 NOTE — Discharge Instructions (Addendum)
Your pregnancy test in urgent care was positive today.  Please go to the maternity assessment unit for further evaluation due to your abdominal cramping in the setting of pregnancy as well as your elevated glucose.  Your fasting blood sugar in the clinic today is 292 indicating possible gestational diabetes.

## 2021-07-03 NOTE — MAU Note (Signed)
.  Kelli Mcpherson is a 26 y.o. at [redacted]w[redacted]d here in [facility]U reporting: three days ago she started having intermittent lower abdominal cramping, thick white discharge, and vaginal itching. 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 She has been taking 1g of valtrex BID for the last x3 days. Denies VB or recent intercourse. Denies pain currently. Just found out she was pregnant at urgent care, so they sent her to be evaluated. Also has type 2 diabetes, is not currently on any medication.  Pain score: 0 Vitals:   07/03/21 1010  BP: (!) 139/94  Pulse: 94  Resp: 16  Temp: 98.8 F (37.1 C)      Lab orders placed from triage:  UA

## 2021-07-03 NOTE — ED Triage Notes (Signed)
Patient states she is having itching and white discharge. Patient has history of HSV and took Valtrex but this is not helping.  Patient also having pain with urination, frequency, and decreased output.

## 2021-07-03 NOTE — ED Provider Notes (Signed)
[place]    CSN: [id number] Arrival date & time: 07/03/21  6378      History   Chief Complaint Chief Complaint  Patient presents with   Vaginal Discharge   Dysuria    HPI Kelli Mcpherson is a 26 y.o. female.   Patient presents to urgent care for evaluation of vaginal discharge and vaginal itching for the last 3 days. Discharge is thick, white, and has an odor. She reports generalized itch to her vagina and states that the itch in the past when she has had yeast infections has been more localized to the vaginal canal. Now, the itch is generalized. Reports abdominal pain that she states is "like a period cramp" to her right and left lower quadrants of her abdomen. Pain is intermittent and is not currently bothering her at time of exam. Last menstrual period was April 28, 2021 and she states her periods are irregular.  Patient states that the last time she had similar symptoms, she found out she was pregnant and states this feels the same. Denies vaginal bleeding, urinary frequency, dysuria, and urgency.  No blood visible in her urine. No nausea, vomiting, diarrhea, headache, or dizziness reported. She also reports possible genital herpes outbreak and has taken Valtrex for the last 3 days twice daily since she started having discharge. Denies rash to genital area, but reports vaginal pain. Denies recent new sexual partners and states she has been in a monogamous relationship with her female partner for the last 3 years.    Vaginal Discharge Associated symptoms: dysuria   Dysuria Associated symptoms: vaginal discharge     Past Medical History:  Diagnosis Date   Chlamydia    COVID-19    Eczema    Genital herpes    Gonorrhea    Pregnancy induced hypertension    Type 2 diabetes mellitus ([place])     Patient Active Problem List   Diagnosis Date Noted   Elevated blood pressure reading 10/05/2020   Pre-eclampsia 06/08/2019   DKA (diabetic ketoacidosis) ([place]) 05/29/2019    Vaginal discharge 02/04/2018   Costochondral chest pain 10/13/2017   Episodic tension-type headache, not intractable 10/13/2017   Obesity 08/01/2015    Past Surgical History:  Procedure Laterality Date   WISDOM TOOTH EXTRACTION      OB History     Gravida  1   Para  1   Term      Preterm  1   AB      Living  1      SAB      IAB      Ectopic      Multiple  0   Live Births  1            Home Medications    Prior to Admission medications   Medication Sig Start Date End Date Taking? Authorizing Provider  valACYclovir (VALTREX) 500 MG tablet Take 1 tablet (500 mg total) by mouth daily. 05/06/21  Yes Donnamae Jude, MD  acetaminophen (TYLENOL) 500 MG tablet Take 500 mg by mouth every 6 (six) hours as needed. Patient not taking: No sig reported    [provider]  albuterol (VENTOLIN HFA) 108 (90 Base) MCG/ACT inhaler Inhale 1-2 puffs into the lungs every 6 (six) hours as needed for wheezing or shortness of breath. 05/09/21   Talbot Grumbling, FNP  fluconazole (DIFLUCAN) 150 MG tablet Take one tab PO x 1 dose now, may repeat in 72 hours if needed  Patient not taking: Reported on 05/09/2021 02/10/21   Geryl Councilman L, PA  furosemide (LASIX) 20 MG tablet Take 1 tablet (20 mg total) by mouth daily for 10 days. Patient not taking: Reported on 05/09/2021 06/20/19 06/30/19  Nugent, Gerrie Nordmann, NP  glucose blood test strip Check your sugar in the morning before you eat breakfast, and one hour after a meal. Patient not taking: Reported on 05/09/2021 04/07/20   Melynda Ripple, MD  glucose monitoring kit (FREESTYLE) monitoring kit 1 each by Does not apply route daily. Check glucose once in the morning before breakfast and 1 hour after a meal Patient not taking: Reported on 05/09/2021 04/07/20   Melynda Ripple, MD  Guaifenesin 1200 MG TB12 Take 1 tablet (1,200 mg total) by mouth in the morning and at bedtime. 05/09/21   Talbot Grumbling, FNP  ibuprofen (ADVIL) 600  MG tablet Take 1 tablet (600 mg total) by mouth every 6 (six) hours as needed. 05/09/21   Talbot Grumbling, FNP  metFORMIN (GLUCOPHAGE) 500 MG tablet Take 1 tablet (500 mg total) by mouth 2 (two) times daily with a meal. 09/06/20   Rasch, Artist Pais, NP  Norethindrone Acetate-Ethinyl Estrad-FE (LOESTRIN 24 FE) 1-20 MG-MCG(24) tablet Take 1 tablet by mouth daily. Patient not taking: Reported on 05/09/2021 10/09/20   Clarnce Flock, MD  promethazine-dextromethorphan (PROMETHAZINE-DM) 6.25-15 MG/5ML syrup Take 5 mLs by mouth 4 (four) times daily as needed for cough. 05/09/21   Talbot Grumbling, FNP  senna (SENOKOT) 8.6 MG TABS tablet Take 2 tablets (17.2 mg total) by mouth daily. 05/09/21   Talbot Grumbling, FNP  enalapril (VASOTEC) 10 MG tablet Take 1 tablet (10 mg total) by mouth daily. 06/20/19 06/27/20  Nugent, Gerrie Nordmann, NP    Family History Family History  Problem Relation Age of Onset   Diabetes Mother    Hypertension Mother    Hypertension Father     Social History Social History   Tobacco Use   Smoking status: Former    Types: Cigars   Smokeless tobacco: Former   Tobacco comments:    Patient occasionally smokes hookah   Vaping Use   Vaping Use: Never used  Substance Use Topics   Alcohol use: Not Currently    Comment: socially   Drug use: Never     Allergies   Patient has no known allergies.   Review of Systems Review of Systems  Genitourinary:  Positive for dysuria and vaginal discharge.  Per HPI   Physical Exam Triage Vital Signs ED Triage Vitals  Enc Vitals Group     BP 07/03/21 0824 138/84     Pulse Rate 07/03/21 0824 87     Resp 07/03/21 0824 16     Temp 07/03/21 0824 98.9 F (37.2 C)     Temp Source 07/03/21 0824 Oral     SpO2 07/03/21 0824 94 %     Weight 07/03/21 0825 300 lb (136.1 kg)     Height 07/03/21 0825 5' 11"  (1.803 m)     Head Circumference --      Peak Flow --      Pain Score --      Pain Loc --      Pain Edu? --       Excl. in [place]? --    No data found.  Updated Vital Signs BP 138/84 (BP Location: Right Arm)   Pulse 87   Temp 98.9 F (37.2 C) (Oral)   Resp 16  Ht 5' 11"  (1.803 m)   Wt 300 lb (136.1 kg)   LMP 04/28/2021 (Exact Date)   SpO2 94%   BMI 41.84 kg/m   Visual Acuity Right Eye Distance:   Left Eye Distance:   Bilateral Distance:    Right Eye Near:   Left Eye Near:    Bilateral Near:     Physical Exam Vitals and nursing note reviewed.  Constitutional:      General: She is not in acute distress.    Appearance: Normal appearance. She is well-developed. She is obese. She is not ill-appearing.  HENT:     Head: Normocephalic and atraumatic.     Right Ear: External ear normal.     Left Ear: External ear normal.     Nose: No rhinorrhea.     Mouth/Throat:     Mouth: Mucous membranes are moist.     Pharynx: No posterior oropharyngeal erythema.  Eyes:     General:        Right eye: No discharge.        Left eye: No discharge.     Extraocular Movements: Extraocular movements intact.     Conjunctiva/sclera: Conjunctivae normal.  Cardiovascular:     Rate and Rhythm: Normal rate and regular rhythm.     Heart sounds: No murmur heard. Pulmonary:     Effort: Pulmonary effort is normal. No respiratory distress.     Breath sounds: Normal breath sounds.  Abdominal:     Palpations: Abdomen is soft.     Tenderness: There is abdominal tenderness in the right lower quadrant, suprapubic area and left lower quadrant.  Musculoskeletal:        General: No swelling.     Cervical back: Neck supple.  Skin:    General: Skin is warm and dry.     Capillary Refill: Capillary refill takes less than 2 seconds.     Findings: No rash.  Neurological:     General: No focal deficit present.     Mental Status: She is alert and oriented to person, place, and time. Mental status is at baseline.     Motor: No weakness.     Gait: Gait normal.  Psychiatric:        Mood and Affect: Mood normal.       UC Treatments / Results  Labs (all labs ordered are listed, but only abnormal results are displayed) Labs Reviewed  POCT URINALYSIS DIPSTICK, ED / UC - Abnormal; Notable for the following components:      Result Value   Glucose, UA >=1000 (*)    Ketones, ur 15 (*)    Hgb urine dipstick TRACE (*)    All other components within normal limits  CBG MONITORING, ED - Abnormal; Notable for the following components:   Glucose-Capillary 292 (*)    All other components within normal limits  POC URINE PREG, ED - Abnormal; Notable for the following components:   Preg Test, Ur POSITIVE (*)    All other components within normal limits  CERVICOVAGINAL ANCILLARY ONLY    EKG   Radiology No results found.  Procedures Procedures (including critical care time)  Medications Ordered in UC Medications - No data to display  Initial Impression / Assessment and Plan / UC Course  I have reviewed the triage vital signs and the nursing notes.  Pertinent labs & imaging results that were available during my care of the patient were reviewed by me and considered in my medical decision making (see chart  for details).  Pregnancy test positive in clinic today.  Urinalysis shows greater than 1000 glucose, ketonuria, and trace hemoglobin.  Patient is not on an SGLT2 inhibitor at this time and denies taking any medications for diabetes currently.  CBG 292 in the clinic. She does not appear to be dehydrated at time of exam.   Recommend patient report to the MAU for further evaluation due to likely new onset gestational diabetes with past medical history of same. She would also benefit from further evaluation in the maternity assessment unit due to abdominal cramping in the setting of pregnancy.  She would benefit from urgent evaluation and blood work to appropriately manage possible new onset gestational diabetes.   Patient is stable to be transported to the maternity assessment unit by personal vehicle  at this time.  She verbalizes understanding and agreement with plan.  Risks of not going to MAU discussed.  Patient's vital signs stable at discharge from urgent care. MAU notified of patient's planned arrival.    Final Clinical Impressions(s) / UC Diagnoses   Final diagnoses:  Gestational glycosuria  Positive pregnancy test  Lower abdominal pain  Vaginal itching  Blood glucose elevated     Discharge Instructions      Your pregnancy test in urgent care was positive today.  Please go to the maternity assessment unit for further evaluation due to your abdominal cramping in the setting of pregnancy as well as your elevated glucose.  Your fasting blood sugar in the clinic today is 33 indicating possible gestational diabetes.      ED Prescriptions   None    PDMP not reviewed this encounter.   Talbot Grumbling, [place] 07/06/21 1035

## 2021-07-03 NOTE — Discharge Instructions (Signed)
Get diabetes testing supplies from [facility]. They have an all inclusive kit (Has glucometer, testing strips, & lancets) by a brand called [employer]. If you don't see it, ask the pharmacist      Return to care  If you have heavier bleeding that soaks through more than 2 pads per hour for an hour or more If you bleed so much that you feel like you might pass out or you do pass out If you have significant abdominal pain that is not improved with Tylenol

## 2021-07-03 NOTE — MAU Provider Note (Signed)
History     [id number]  Arrival date and time: 07/03/21 0950    Chief Complaint  Patient presents with   Diabetes   Vaginal Discharge   Vaginal Itching     HPI Kelli Mcpherson is a 26 y.o. at [redacted]w[redacted]d who presents for vaginal irritation & abdominal cramping. Went to urgent care this morning for same, was found to be pregnant & had hyperglycemias so was sent here for evaluation.  Patient reports intermittent lower abdominal cramping x 3 days. Nothing makes better or worse. Hasn't treated symptoms.  At the same time she started having vaginal irritation & itching. Thought it was due to HSV so started taking her valtrex with no improvement. Has also noted a thick vaginal discharge. Had vaginal swabs done at urgent care. Has had recurrent yeast infections over the last few years.  Fasting blood sugar at urgent care was 292. Was started on metformin last year but hasn't taken it in "a long time" because she ran out of it & currently doesn't have a PCP.  Denies fever, headache, blurred vision, n/v/d, constipation, dysuria, vaginal bleeding. Has had increase in urination for the last 3 days.   OB History     Gravida  2   Para  1   Term      Preterm  1   AB      Living  1      SAB      IAB      Ectopic      Multiple  0   Live Births  1           Past Medical History:  Diagnosis Date   Chlamydia    COVID-19    DKA (diabetic ketoacidosis) ([facility]) 05/29/2019   Eczema    Genital herpes    Gonorrhea    Pre-eclampsia 06/08/2019   Type 2 diabetes mellitus ([facility])     Past Surgical History:  Procedure Laterality Date   WISDOM TOOTH EXTRACTION      Family History  Problem Relation Age of Onset   Diabetes Mother    Hypertension Mother    Hypertension Father     No Known Allergies  No current facility-administered medications on file prior to encounter.   Current Outpatient Medications on File Prior to Encounter  Medication Sig Dispense Refill    acetaminophen (TYLENOL) 500 MG tablet Take 500 mg by mouth every 6 (six) hours as needed. (Patient not taking: No sig reported)     albuterol (VENTOLIN HFA) 108 (90 Base) MCG/ACT inhaler Inhale 1-2 puffs into the lungs every 6 (six) hours as needed for wheezing or shortness of breath. 8 g 0   [DISCONTINUED] enalapril (VASOTEC) 10 MG tablet Take 1 tablet (10 mg total) by mouth daily. 30 tablet 0     ROS Pertinent positives and negative per HPI, all others reviewed and negative  Physical Exam   BP 136/79 (BP Location: Right Arm)   Pulse 85   Temp 98.8 F (37.1 C) (Oral)   Resp 14   Ht  (1.803 m)   Wt (!) 138.3 kg   LMP 04/28/2021 (Exact Date)   BMI 42.54 kg/m   Patient Vitals for the past 24 hrs:  BP Temp Temp src Pulse Resp Height Weight  07/03/21 1436 136/79 -- -- 85 14 -- --  07/03/21 1022 140/66 -- -- 93 -- -- --  07/03/21 1010 (!) 139/94 98.8 F (37.1 C) Oral 94 16 -- --  07/03/21  1004 -- -- -- -- -- 5\' 11"  (1.803 m) (!) 138.3 kg    Physical Exam Vitals and nursing note reviewed. Exam conducted with a chaperone present.  Constitutional:      General: She is not in acute distress.    Appearance: Normal appearance. She is obese. She is not ill-appearing or diaphoretic.  HENT:     Head: Normocephalic and atraumatic.  Eyes:     General: No scleral icterus.    Conjunctiva/sclera: Conjunctivae normal.     Pupils: Pupils are equal, round, and reactive to light.  Cardiovascular:     Rate and Rhythm: Normal rate and regular rhythm.     Heart sounds: Normal heart sounds.  Pulmonary:     Effort: Pulmonary effort is normal. No respiratory distress.     Breath sounds: Normal breath sounds. No wheezing.  Abdominal:     Palpations: Abdomen is soft.     Tenderness: There is no abdominal tenderness. There is no rebound.  Genitourinary:    Comments: Bilateral vulvar erythema & excoriation Skin:    General: Skin is warm and dry.  Neurological:     Mental Status: She is  alert.  Psychiatric:        Mood and Affect: Mood normal.        Behavior: Behavior normal.       Labs Results for orders placed or performed during the hospital encounter of 07/03/21 (from the past 24 hour(s))  Urinalysis, Routine w reflex microscopic Urine, Clean Catch     Status: Abnormal   Collection Time: 07/03/21 10:05 AM  Result Value Ref Range   Color, Urine YELLOW YELLOW   APPearance CLEAR CLEAR   Specific Gravity, Urine 1.010 1.005 - 1.030   pH 6.0 5.0 - 8.0   Glucose, UA >=500 (A) NEGATIVE mg/dL   Hgb urine dipstick TRACE (A) NEGATIVE   Bilirubin Urine NEGATIVE NEGATIVE   Ketones, ur 15 (A) NEGATIVE mg/dL   Protein, ur NEGATIVE NEGATIVE mg/dL   Nitrite NEGATIVE NEGATIVE   Leukocytes,Ua NEGATIVE NEGATIVE  Urinalysis, Microscopic (reflex)     Status: Abnormal   Collection Time: 07/03/21 10:05 AM  Result Value Ref Range   RBC / HPF 0-5 0 - 5 RBC/hpf   WBC, UA 0-5 0 - 5 WBC/hpf   Bacteria, UA RARE (A) NONE SEEN   Squamous Epithelial / LPF 11-20 0 - 5  CBC     Status: None   Collection Time: 07/03/21 10:22 AM  Result Value Ref Range   WBC 5.7 4.0 - 10.5 K/uL   RBC 4.51 3.87 - 5.11 MIL/uL   Hemoglobin 12.9 12.0 - 15.0 g/dL   HCT 07/05/21 03.8 - 88.2 %   MCV 88.0 80.0 - 100.0 fL   MCH 28.6 26.0 - 34.0 pg   MCHC 32.5 30.0 - 36.0 g/dL   RDW 80.0 34.9 - 17.9 %   Platelets 218 150 - 400 K/uL   nRBC 0.0 0.0 - 0.2 %  Comprehensive metabolic panel     Status: Abnormal   Collection Time: 07/03/21 10:22 AM  Result Value Ref Range   Sodium 136 135 - 145 mmol/L   Potassium 3.7 3.5 - 5.1 mmol/L   Chloride 103 98 - 111 mmol/L   CO2 21 (L) 22 - 32 mmol/L   Glucose, Bld 308 (H) 70 - 99 mg/dL   BUN <5 (L) 6 - 20 mg/dL   Creatinine, Ser 07/05/21 0.44 - 1.00 mg/dL   Calcium 9.2 8.9 - 10.3  mg/dL   Total Protein 7.3 6.5 - 8.1 g/dL   Albumin 4.1 3.5 - 5.0 g/dL   AST 44 (H) 15 - 41 U/L   ALT 65 (H) 0 - 44 U/L   Alkaline Phosphatase 54 38 - 126 U/L   Total Bilirubin 0.7 0.3 - 1.2  mg/dL   GFR, Estimated >40>60 >98>60 mL/min   Anion gap 12 5 - 15  Beta-hydroxybutyric acid     Status: Abnormal   Collection Time: 07/03/21 10:22 AM  Result Value Ref Range   Beta-Hydroxybutyric Acid 0.48 (H) 0.05 - 0.27 mmol/L  hCG, quantitative, pregnancy     Status: Abnormal   Collection Time: 07/03/21 10:22 AM  Result Value Ref Range   hCG, Beta Chain, Quant, S 4,232 (H) <5 mIU/mL  Hemoglobin A1c     Status: Abnormal   Collection Time: 07/03/21 10:22 AM  Result Value Ref Range   Hgb A1c MFr Bld 10.1 (H) 4.8 - 5.6 %   Mean Plasma Glucose 243.17 mg/dL  Glucose, capillary     Status: Abnormal   Collection Time: 07/03/21 11:50 AM  Result Value Ref Range   Glucose-Capillary 256 (H) 70 - 99 mg/dL  Glucose, capillary     Status: Abnormal   Collection Time: 07/03/21  1:14 PM  Result Value Ref Range   Glucose-Capillary 212 (H) 70 - 99 mg/dL    Imaging US OB LESS THAN 14 WEEKS WITH OB TRANSVAGINAL  Result Date: 07/03/2021 CLINICAL DATA:  Pain. Quantitative HCG pending. Last menstrual period 04/28/2021 which would correspond to estimated gestational age of [redacted] weeks 3 days. EXAM: OBSTETRIC <14 WK US AND TRANSVAGINAL OB US DOPPLER ULTRASOUND OF OVARIES TECHNIQUE: Both transabdominal and transvaginal ultrasound examinations were performed for complete evaluation of the gestation as well as the maternal uterus, adnexal regions, and pelvic cul-de-sac. Transvaginal technique was performed to assess early pregnancy. Color and duplex Doppler ultrasound was utilized to evaluate blood flow to the ovaries. COMPARISON:  Non obstetric Ob ultrasound 09/17/2020 FINDINGS: Intrauterine gestational sac: Single Yolk sac:  Not Visualized. Embryo:  Not Visualized. Cardiac Activity: Not Visualized. MSD: 6.3 mm   5 w   2 d Subchorionic hemorrhage:  None visualized. Maternal uterus/adnexae: Within normal limits. Pulsed Doppler evaluation of both ovaries demonstrates normal appearing low-resistance arterial and venous  waveforms. IMPRESSION: There is anechoic structure within the endometrial canal suggesting a gestational sac with surrounding decidual reaction. Probable early intrauterine gestational sac, but no yolk sac, fetal pole, or cardiac activity yet visualized. Recommend follow-up quantitative B-HCG levels and follow-up US in 14 days to assess viability. This recommendation follows SRU consensus guidelines: Diagnostic Criteria for Nonviable Pregnancy Early in the First Trimester. Malva Limes Engl J Med 2013; 119:1478-29; 369:1443-51. Electronically Signed   By: Neita Garnetonald  Viola M.D.   On: 07/03/2021 11:39    MAU Course  Procedures Lab Orders         Urinalysis, Routine w reflex microscopic Urine, Clean Catch         CBC         Comprehensive metabolic panel         Beta-hydroxybutyric acid         hCG, quantitative, pregnancy         Hemoglobin A1c         Urinalysis, Microscopic (reflex)         Glucose, capillary         Glucose, capillary     Meds ordered this encounter  Medications   0.9 %  sodium chloride infusion   insulin aspart (novoLOG) injection 15 Units   insulin aspart (novoLOG) injection 15 Units   valACYclovir (VALTREX) 500 MG tablet    Sig: Take 1 tablet (500 mg total) by mouth 2 (two) times daily. Take for 3 days with onset of outbreak    Dispense:  6 tablet    Refill:  10    Order Specific Question:   Supervising Provider    Answer:   Jaynie Collins A [[id number]]   terconazole (TERAZOL 7) 0.4 % vaginal cream    Sig: Place 1 applicator vaginally at bedtime. Use for seven days    Dispense:  45 g    Refill:  0    Order Specific Question:   Supervising Provider    Answer:   Jaynie Collins A [[id number]]   labetalol (NORMODYNE) 100 MG tablet    Sig: Take 1 tablet (100 mg total) by mouth 2 (two) times daily.    Dispense:  60 tablet    Refill:  0    Order Specific Question:   Supervising Provider    Answer:   Jaynie Collins A [[id number]]   Prenatal Multivit-Min-FA ([employer] PRENATAL GUMMY) 0.4 MG CHEW    Sig: Chew 1  Units by mouth daily.    Dispense:  30 tablet    Refill:  10    Order Specific Question:   Supervising Provider    Answer:   Jaynie Collins A [[id number]]   insulin NPH Human (NOVOLIN N) 100 UNIT/ML injection    Sig: Inject 0.15 mLs (15 Units total) into the skin in the morning and at bedtime. In the morning with breakfast, and at bedtime    Dispense:  10 mL    Refill:  0    Order Specific Question:   Supervising Provider    Answer:   Jaynie Collins A [[id number]]   insulin regular (NOVOLIN R) 100 units/mL injection    Sig: Inject 0.1 mLs (10 Units total) into the skin 2 (two) times daily with a meal. With breakfast & dinner    Dispense:  10 mL    Refill:  0    Order Specific Question:   Supervising Provider    Answer:   Jaynie Collins A [[id number]]   Insulin Syringe-Needle U-100 29G X 1/2" 0.3 ML MISC    Sig: Use 4 times per day to administer insulin    Dispense:  200 each    Refill:  0    Order Specific Question:   Supervising Provider    Answer:   Jaynie Collins A [[id number]]   Imaging Orders         US OB LESS THAN 14 WEEKS WITH OB TRANSVAGINAL     MDM 1 or more chronic illness with exacerbation or progression - uncontrolled type 2 diabetes & chronic hypertension  Ordered CBC, CMP, HCG, beta-hydroxybutyric acid, & OB ultrasound Reviewed lab & imaging results. Not in DKA. Ultrasound shows IUGS.  Review previous prenatal record from 2021, as well as inpatient notes from that delivery & multiple urgent care visits in epic  Prescription drug management Assessment and Plan   1. Type 2 diabetes mellitus complicating pregnancy in first trimester, antepartum  -Per consult with Dr. Adrian Blackwater will start on insulin (NPH 15 U with breakfast & QHS, regular insulin 10 U with breakfast & dinner -Reviewed importance of BS management -Patient is self pay until July 1 when her new job's benefits are active. Discussed getting testing [facility] -Amb referral  for diabetes/nutrition management.  Ultimately she should see endocrinology but will make plans for establishing care when her insurance is active  2. Chronic hypertension in pregnancy  -Rx labetalol 100 BID  3. Herpes simplex vulvovaginitis  -Currently no outbreak. Refilled valtrex per patient request  4. Yeast vaginitis  -Vaginal swab from urgent care in process. Will treat presumptively for yeast based on exam. Rx terazol. Discussed BS control will help prevent recurrent vaginitis.   5. Pregnancy with uncertain fetal viability, single or unspecified fetus  -Scheduled for viability ultrasound in 2 weeks. Reviewed s/s of SAB & reasons to return to MAU  6. [redacted] weeks gestation of pregnancy  -Rx prenatal gummy vitamins per patient request     Judeth Horn, NP 07/03/21 4:21 PM

## 2021-07-04 ENCOUNTER — Telehealth (HOSPITAL_COMMUNITY): Payer: Self-pay | Admitting: Emergency Medicine

## 2021-07-04 LAB — CERVICOVAGINAL ANCILLARY ONLY
Bacterial Vaginitis (gardnerella): NEGATIVE
Candida Glabrata: NEGATIVE
Candida Vaginitis: POSITIVE — AB
Chlamydia: NEGATIVE
Comment: NEGATIVE
Comment: NEGATIVE
Comment: NEGATIVE
Comment: NEGATIVE
Comment: NEGATIVE
Comment: NORMAL
Neisseria Gonorrhea: NEGATIVE
Trichomonas: NEGATIVE

## 2021-07-16 ENCOUNTER — Ambulatory Visit: Payer: 59

## 2021-07-16 ENCOUNTER — Other Ambulatory Visit: Payer: Self-pay

## 2021-07-16 ENCOUNTER — Other Ambulatory Visit: Payer: 59

## 2021-07-16 DIAGNOSIS — O3680X0 Pregnancy with inconclusive fetal viability, not applicable or unspecified: Secondary | ICD-10-CM

## 2021-07-24 ENCOUNTER — Other Ambulatory Visit: Payer: Self-pay | Admitting: Obstetrics and Gynecology

## 2021-07-24 ENCOUNTER — Encounter: Payer: Self-pay | Admitting: Obstetrics and Gynecology

## 2021-07-24 DIAGNOSIS — Z3689 Encounter for other specified antenatal screening: Secondary | ICD-10-CM

## 2021-07-25 LAB — OB RESULTS CONSOLE HIV ANTIBODY (ROUTINE TESTING): HIV: NONREACTIVE

## 2021-07-25 LAB — HEPATITIS C ANTIBODY: HCV Ab: NEGATIVE

## 2021-07-25 LAB — OB RESULTS CONSOLE RUBELLA ANTIBODY, IGM: Rubella: IMMUNE

## 2021-07-25 LAB — OB RESULTS CONSOLE ABO/RH: RH Type: POSITIVE

## 2021-07-25 LAB — OB RESULTS CONSOLE ANTIBODY SCREEN: Antibody Screen: NEGATIVE

## 2021-07-25 LAB — OB RESULTS CONSOLE RPR: RPR: NONREACTIVE

## 2021-07-25 LAB — OB RESULTS CONSOLE HEPATITIS B SURFACE ANTIGEN: Hepatitis B Surface Ag: NEGATIVE

## 2021-08-14 LAB — OB RESULTS CONSOLE GC/CHLAMYDIA
Chlamydia: NEGATIVE
Neisseria Gonorrhea: NEGATIVE

## 2021-08-20 ENCOUNTER — Other Ambulatory Visit: Payer: Self-pay | Admitting: *Deleted

## 2021-08-20 ENCOUNTER — Ambulatory Visit: Payer: Managed Care, Other (non HMO) | Admitting: *Deleted

## 2021-08-20 ENCOUNTER — Ambulatory Visit (HOSPITAL_BASED_OUTPATIENT_CLINIC_OR_DEPARTMENT_OTHER): Payer: Managed Care, Other (non HMO) | Admitting: Obstetrics

## 2021-08-20 ENCOUNTER — Ambulatory Visit: Payer: Managed Care, Other (non HMO) | Attending: Obstetrics and Gynecology

## 2021-08-20 VITALS — BP 136/76 | HR 80

## 2021-08-20 DIAGNOSIS — Z3689 Encounter for other specified antenatal screening: Secondary | ICD-10-CM | POA: Insufficient documentation

## 2021-08-20 DIAGNOSIS — O24111 Pre-existing diabetes mellitus, type 2, in pregnancy, first trimester: Secondary | ICD-10-CM

## 2021-08-20 DIAGNOSIS — Z3A12 12 weeks gestation of pregnancy: Secondary | ICD-10-CM | POA: Diagnosis not present

## 2021-08-20 DIAGNOSIS — O10911 Unspecified pre-existing hypertension complicating pregnancy, first trimester: Secondary | ICD-10-CM

## 2021-08-20 DIAGNOSIS — O99211 Obesity complicating pregnancy, first trimester: Secondary | ICD-10-CM

## 2021-08-23 NOTE — Progress Notes (Signed)
MFM Note  Kelli Mcpherson was seen for a first trimester ultrasound and consultation at the request of Dr. Velvet Bathe due to maternal obesity with a BMI of 42, poorly controlled pregestational diabetes, and chronic hypertension treated with labetalol 100 mg twice a day.    For treatment of her diabetes, she is currently using her mother's insulin for glycemic control.  She is scheduled to see an endocrinologist in [place] on August 27, 2021 to help with the management of her diabetes.  Her last pregnancy in 2021 was complicated by COVID pneumonia with DKA.  She required a preterm delivery at 33+ weeks due to possible severe preeclampsia.  Her hemoglobin A1c drawn earlier in her current pregnancy was 10.1%.  The patient reports that she had the Panorama cell free DNA test drawn last week.  The results of that test are currently pending.  On today's exam, a viable singleton intrauterine gestation was noted with a crown-rump length consistent with an [facility] of March 04, 2022.  The following were discussed during our consultation today:  Pregestational diabetes and pregnancy  The implications and management of diabetes in pregnancy was discussed in detail with the patient.    She was advised to monitor her fingersticks 4 times daily (fasting and 2 hours after each meal).    She was advised that our goals for her fingerstick values are fasting values of 90-95 or less and two-hour postprandial values of 120 or less.    The patient was advised that getting her blood glucose levels as close to this range as possible will provide her with the most optimal obstetrical outcomes.  Should the majority of her fingerstick results be above these values, her insulin dose may have to be adjusted to help her achieve better glycemic control.   As the patient may not always be compliant with monitoring her fingerstick values and giving herself the insulin injections, she may benefit from having a Dexcom  continuous glucose monitor which will work in tandem with an Omnipod insulin pump to provide her with the appropriate insulin dose in response to her serum glucose levels.    I have made arrangements for her to meet with the diabetic nurse here at the [facility] in [place] who can help her obtain the Dexcom glucose monitor and an Omnipod insulin pump on September 05, 2021.  The patient was encouraged to maintain euglycemia throughout her pregnancy to avoid complications such as diabetic ketoacidosis.  The increased risks of fetal congenital malformations such as heart defects and other abnormalities associated with diabetes was discussed.    A detailed fetal anatomy scan has been scheduled for her in our office at 19 weeks.    We will refer her for a fetal echocardiogram at between 21 to 22 weeks.    She will then be followed with monthly growth ultrasounds.    Twice weekly fetal testing using either nonstress tests or biophysical profiles should be started at around 32 weeks.  The increased risk of polyhydramnios, fetal macrosomia, and preeclampsia associated with diabetes was also discussed.    The patient was advised that delivery for well-controlled diabetes in pregnancy is usually recommended at around 39 weeks.    Delivery prior to 39 weeks may be necessary should her glycemic control be poor or should she develop preeclampsia.  Chronic hypertension in pregnancy  The implications and management of chronic hypertension in pregnancy was discussed.   The patient was advised to continue taking labetalol as prescribed to keep  her blood pressures at 140/90 or less.    Her dosage of labetalol may need to be increased should her blood pressures be persistently above 140/90 later in her pregnancy.    The increased risk of superimposed preeclampsia, an indicated preterm delivery, and possible fetal growth restriction due to chronic hypertension in pregnancy was discussed.   She  should have baseline blood work to assess her kidney function, liver function, and platelets (CBC and metabolic panel).  She should also have a baseline P/C ratio or a 24-hour urine collected to assess for total protein in her urine.  To decrease her risk of superimposed preeclampsia, she was advised to start taking 2 tablets of baby aspirin (81 mg each) daily for preeclampsia prophylaxis.  Obesity in pregnancy  The patient was advised that maternal obesity increases the risk of adverse pregnancy outcomes such as miscarriages/stillbirth, congenital anomalies such as neural tube defects/spina bifida, and may increase the risk of preeclampsia requiring an indicated preterm birth.  The recommended total weight gain in pregnancy for obese women's between 10 to 20 pounds.  As maternal obesity may present challenges associated with the management of anesthesia, an anesthesia consult should be obtained when she is admitted in labor.  The patient was reassured that many pregnant women have pregnancies complicated by pregestational diabetes, chronic hypertension, and obesity.  With proper management, most women with these conditions may anticipate a successful pregnancy outcome.    The patient stated that all of her questions have been answered to her satisfaction.    We will continue to follow her closely with you throughout her pregnancy.  A detailed fetal anatomy scan has been scheduled for her in our office at 19 weeks.  A total of 60  minutes was spent counseling and coordinating the care for this patient.  Greater than 50% of the time was spent in direct face-to-face contact.

## 2021-08-27 ENCOUNTER — Ambulatory Visit (INDEPENDENT_AMBULATORY_CARE_PROVIDER_SITE_OTHER): Payer: Managed Care, Other (non HMO) | Admitting: "Endocrinology

## 2021-08-27 ENCOUNTER — Encounter: Payer: Self-pay | Admitting: "Endocrinology

## 2021-08-27 VITALS — BP 122/80 | HR 68 | Ht 72.0 in | Wt 306.0 lb

## 2021-08-27 DIAGNOSIS — I10 Essential (primary) hypertension: Secondary | ICD-10-CM

## 2021-08-27 DIAGNOSIS — Z6841 Body Mass Index (BMI) 40.0 and over, adult: Secondary | ICD-10-CM | POA: Diagnosis not present

## 2021-08-27 DIAGNOSIS — E1165 Type 2 diabetes mellitus with hyperglycemia: Secondary | ICD-10-CM

## 2021-08-27 DIAGNOSIS — Z794 Long term (current) use of insulin: Secondary | ICD-10-CM

## 2021-08-27 MED ORDER — LEVEMIR FLEXTOUCH 100 UNIT/ML ~~LOC~~ SOPN
40.0000 [IU] | PEN_INJECTOR | Freq: Every day | SUBCUTANEOUS | 2 refills | Status: DC
Start: 2021-08-27 — End: 2022-02-14

## 2021-08-27 MED ORDER — INSULIN LISPRO (1 UNIT DIAL) 100 UNIT/ML (KWIKPEN): 10.0000 [IU] | PEN_INJECTOR | Freq: Three times a day (TID) | SUBCUTANEOUS | 2 refills | Status: DC

## 2021-08-27 MED ORDER — DEXCOM G7 RECEIVER DEVI: 0 refills | Status: AC

## 2021-08-27 MED ORDER — DEXCOM G7 SENSOR MISC: 2 refills | Status: DC

## 2021-08-27 NOTE — Progress Notes (Signed)
Endocrinology Consult Note       08/27/2021, 5:39 PM   Subjective:    Patient ID: Kelli Mcpherson, female    DOB: 08-24-1995.  Kelli Mcpherson is being seen in consultation for management of currently uncontrolled symptomatic type 2 diabetes requested by  Tyson Dense, MD. She is pregnant at [redacted] weeks of gestation.  She has a history of gestational diabetes 2 years ago when she was pregnant with her 25-year-old child.     Past Medical History:  Diagnosis Date   Chlamydia    COVID-19    DKA (diabetic ketoacidosis) ([place]) 05/29/2019   Eczema    Genital herpes    Gonorrhea    Pre-eclampsia 06/08/2019   Preeclampsia    Type 2 diabetes mellitus (HCC)     Past Surgical History:  Procedure Laterality Date   WISDOM TOOTH EXTRACTION      Social History   Socioeconomic History   Marital status: Single    Spouse name: Not on file   Number of children: Not on file   Years of education: Not on file   Highest education level: Not on file  Occupational History   Not on file  Tobacco Use   Smoking status: Never   Smokeless tobacco: Never   Tobacco comments:    Patient occasionally smokes hookah   Vaping Use   Vaping Use: Former   Substances: Nicotine, Flavoring  Substance and Sexual Activity   Alcohol use: Not Currently    Comment: socially   Drug use: Never   Sexual activity: Yes  Other Topics Concern   Not on file  Social History Narrative   Not on file   Social Determinants of Health   Financial Resource Strain: Not on file  Food Insecurity: No Food Insecurity (09/06/2020)   Hunger Vital Sign    Worried About Running Out of Food in the Last Year: Never true    Ran Out of Food in the Last Year: Never true  Transportation Needs: No Transportation Needs (09/06/2020)   PRAPARE - Hydrologist (Medical): No    Lack of Transportation (Non-Medical): No   Physical Activity: Not on file  Stress: Not on file  Social Connections: Not on file    Family History  Problem Relation Age of Onset   Diabetes Mother    Hypertension Mother    Hypertension Father    Asthma Neg Hx    Cancer Neg Hx    Birth defects Neg Hx    Heart disease Neg Hx    Stroke Neg Hx     Outpatient Encounter Medications as of 08/27/2021  Medication Sig   aspirin EC 81 MG tablet Take 81 mg by mouth 2 (two) times daily. Swallow whole.   Continuous Blood Gluc Receiver (DEXCOM G7 RECEIVER) DEVI Use to monitor BG continuously   Continuous Blood Gluc Sensor (DEXCOM G7 SENSOR) MISC Change sensor every 10 days   insulin detemir (LEVEMIR FLEXTOUCH) 100 UNIT/ML FlexPen Inject 40 Units into the skin at bedtime.   [DISCONTINUED] insulin glargine (LANTUS SOLOSTAR) 100 UNIT/ML Solostar Pen Inject 40 Units into  the skin at bedtime.   insulin lispro (HUMALOG KWIKPEN) 100 UNIT/ML KwikPen Inject 10-16 Units into the skin 3 (three) times daily before meals.   Insulin Syringe-Needle U-100 29G X 1/2" 0.3 ML MISC Use 4 times per day to administer insulin   labetalol (NORMODYNE) 100 MG tablet Take 1 tablet (100 mg total) by mouth 2 (two) times daily.   Prenatal Multivit-Min-FA ([employer] PRENATAL GUMMY) 0.4 MG CHEW Chew 1 Units by mouth daily.   valACYclovir (VALTREX) 500 MG tablet Take 1 tablet (500 mg total) by mouth 2 (two) times daily. Take for 3 days with onset of outbreak (Patient not taking: Reported on 08/27/2021)   [DISCONTINUED] acetaminophen (TYLENOL) 500 MG tablet Take 500 mg by mouth every 6 (six) hours as needed.   [DISCONTINUED] albuterol (VENTOLIN HFA) 108 (90 Base) MCG/ACT inhaler Inhale 1-2 puffs into the lungs every 6 (six) hours as needed for wheezing or shortness of breath.   [DISCONTINUED] enalapril (VASOTEC) 10 MG tablet Take 1 tablet (10 mg total) by mouth daily.   [DISCONTINUED] insulin lispro (HUMALOG KWIKPEN) 100 UNIT/ML KwikPen Inject 10 Units into the skin 3 (three) times  daily before meals.   [DISCONTINUED] insulin NPH Human (NOVOLIN N) 100 UNIT/ML injection Inject 0.15 mLs (15 Units total) into the skin in the morning and at bedtime. In the morning with breakfast, and at bedtime   [DISCONTINUED] insulin regular (NOVOLIN R) 100 units/mL injection Inject 0.1 mLs (10 Units total) into the skin 2 (two) times daily with a meal. With breakfast & dinner   No facility-administered encounter medications on file as of 08/27/2021.    ALLERGIES: No Known Allergies  VACCINATION STATUS: Immunization History  Administered Date(s) Administered   DTaP 01/26/1997, 03/29/1997, 06/05/1997, 02/27/1998   HPV Quadrivalent 10/01/2007, 07/05/2008, 02/09/2009   Hepatitis A 10/01/2007, 07/05/2008   Hepatitis B [birth year]1996, 01/26/1997, 09/07/1997   HiB (PRP-OMP) 01/26/1997, 03/29/1997, 06/05/1997, 02/27/1998   IPV 01/26/1997, 03/29/1997, 11/24/1997, 10/08/2007   Influenza Nasal 12/09/2008, 11/17/2009, 10/26/2010, 11/13/2011   Influenza,inj,Quad PF,6+ Mos 10/05/2020   Influenza-Unspecified 01/02/2006, 12/29/2006, 10/23/2007   MMR 11/24/1997, 10/08/2007   Meningococcal Conjugate 07/05/2008, 08/01/2015   Td 10/01/2007   Tdap 10/01/2007   Varicella 10/24/1999, 10/08/2007    Diabetes She presents for her initial diabetic visit. She has type 2 diabetes mellitus. Onset time: She was diagnosed at approximate age of 38 years. There are no hypoglycemic associated symptoms. Pertinent negatives for hypoglycemia include no confusion, headaches, pallor or seizures. Associated symptoms include fatigue, polydipsia and polyuria. Pertinent negatives for diabetes include no chest pain and no polyphagia. There are no hypoglycemic complications. Symptoms are worsening. There are no diabetic complications. Risk factors for coronary artery disease include diabetes mellitus and obesity. Her weight is fluctuating minimally. She is following a generally unhealthy diet. When asked about meal planning, she  reported none. She has not had a previous visit with a dietitian. She rarely participates in exercise. (She did not bring any logs nor meter to review.  Her recent A1c was 10.1%.  She shares insulin with her mom.  Her previous prescription was Novolin 70/30. She reports that she takes Lantus 20 units twice daily, Humalog on a sliding scale.) An ACE inhibitor/angiotensin II receptor blocker is not being taken.  Hypertension This is a chronic problem. The current episode started more than 1 year ago. The problem is controlled. Pertinent negatives include no chest pain, headaches, palpitations or shortness of breath. Risk factors for coronary artery disease include diabetes mellitus, obesity and smoking/tobacco exposure.  Past treatments include beta blockers.     Review of Systems  Constitutional:  Positive for fatigue. Negative for chills, fever and unexpected weight change.  HENT:  Negative for trouble swallowing and voice change.   Eyes:  Negative for visual disturbance.  Respiratory:  Negative for cough, shortness of breath and wheezing.   Cardiovascular:  Negative for chest pain, palpitations and leg swelling.  Gastrointestinal:  Negative for diarrhea, nausea and vomiting.  Endocrine: Positive for polydipsia and polyuria. Negative for cold intolerance, heat intolerance and polyphagia.  Musculoskeletal:  Negative for arthralgias and myalgias.  Skin:  Negative for color change, pallor, rash and wound.  Neurological:  Negative for seizures and headaches.  Psychiatric/Behavioral:  Negative for confusion and suicidal ideas.     Objective:       08/27/2021    2:28 PM 08/20/2021   12:33 PM 07/03/2021    2:36 PM  Vitals with BMI  Height _0     Weight 306 lbs    BMI 67.34    Systolic 193 790 240  Diastolic 80 76 79  Pulse 68 80 85    BP 122/80   Pulse 68   Ht 6' (1.829 m)   Wt (!) 306 lb (138.8 kg)   LMP 05/28/2021   BMI 41.50 kg/m   Wt Readings from Last 3 Encounters:  08/27/21  (!) 306 lb (138.8 kg)  07/03/21 (!) 305 lb (138.3 kg)  07/03/21 300 lb (136.1 kg)     Physical Exam Constitutional:      Appearance: She is well-developed.  HENT:     Head: Normocephalic and atraumatic.  Neck:     Thyroid: No thyromegaly.     Trachea: No tracheal deviation.  Cardiovascular:     Rate and Rhythm: Normal rate and regular rhythm.  Pulmonary:     Effort: Pulmonary effort is normal.     Breath sounds: Normal breath sounds.  Abdominal:     General: Bowel sounds are normal.     Palpations: Abdomen is soft.     Tenderness: There is no abdominal tenderness. There is no guarding.  Musculoskeletal:        General: Normal range of motion.     Cervical back: Normal range of motion and neck supple.  Skin:    General: Skin is warm and dry.     Coloration: Skin is not pale.     Findings: No erythema or rash.  Neurological:     Mental Status: She is alert and oriented to person, place, and time.     Cranial Nerves: No cranial nerve deficit.     Coordination: Coordination normal.     Deep Tendon Reflexes: Reflexes are normal and symmetric.  Psychiatric:        Judgment: Judgment normal.     Comments: Patient is hesitant.       CMP ( most recent) CMP     Component Value Date/Time   NA 136 07/03/2021 1022   K 3.7 07/03/2021 1022   CL 103 07/03/2021 1022   CO2 21 (L) 07/03/2021 1022   GLUCOSE 308 (H) 07/03/2021 1022   BUN <5 (L) 07/03/2021 1022   CREATININE 0.68 07/03/2021 1022   CALCIUM 9.2 07/03/2021 1022   PROT 7.3 07/03/2021 1022   ALBUMIN 4.1 07/03/2021 1022   AST 44 (H) 07/03/2021 1022   ALT 65 (H) 07/03/2021 1022   ALKPHOS 54 07/03/2021 1022   BILITOT 0.7 07/03/2021 1022   GFRNONAA >60 07/03/2021 1022  GFRAA >60 06/20/2019 1134     Diabetic Labs (most recent): Lab Results  Component Value Date   HGBA1C 10.1 (H) 07/03/2021   HGBA1C 9.9 (H) 09/06/2020   HGBA1C 10.4 (H) 04/07/2020     Lab Results  Component Value Date   TSH 0.123 (L)  05/30/2019   FREET4 0.93 05/30/2019           Assessment & Plan:   1. Type 2 diabetes mellitus with hyperglycemia, with long-term current use of insulin (HCC)  - Kelli Mcpherson has currently uncontrolled symptomatic type 2 DM since  26 years of age, pregnant at [redacted] weeks of gestation.  Her most recent A1c was 10.1%.  Her previous 2 A1c measurements were 9.9% and 10.4%. Recent labs reviewed. - I had a long discussion with her about the progressive nature of diabetes and the pathology behind its complications. -I also discussed about the need for optimal control of diabetes to optimize the outcomes of her pregnancy and avoid complications fetal and maternal. -her diabetes is complicated by obesity, hypertension, and she remains at a high risk for more acute and chronic complications which include obstetric complications, CAD, CVA, CKD, retinopathy, and neuropathy. These are all discussed in detail with her.  - I discussed all available options of managing her diabetes .I have counseled her on optimizing her diet with Whole Food , Plant Predominant  ( WFPP) nutrition as recommended by [employer] of Lifestyle Medicine. Patient is encouraged to switch to  unprocessed or minimally processed  complex starch, adequate protein intake (mainly plant source), minimal liquid fat ( mainly vegetable oils), plenty of fruits, and vegetables. -  she is advised to stick to a routine mealtimes to eat 3 complete meals a day and snack only when necessary ( to snack only to correct hypoglycemia BG <70 day time or <100 at night).   - she acknowledges that there is a room for improvement in her food and drink choices. - Further Specific Suggestion is made for her to avoid simple carbohydrates  from her diet including Cakes, Sweet Desserts, Ice Cream, Soda (diet and regular), Sweet Tea, Candies, Chips, Cookies, Store Bought Juices, Alcohol ,  Artificial Sweeteners,  Coffee Creamer, and "Sugar-free" Products.  This will help patient to have more stable blood glucose profile and potentially avoid unintended weight gain. - she will be scheduled with Jearld Fenton, RDN, CDE for individualized diabetes education.  - I have approached her with the following plan to manage  her diabetes and patient agrees:   -I discussed glycemic targets for her: 65-95 Premeal and fasting, less than  120 milligrams per DL 2  hours postprandial. - she will be initiated on insulin analogs.  I advised her not to share insulin with her mother.  -I discussed and prescribed Levemir 40 units nightly, prescribed Humalog  units 3 times a day with meals  for pre-meal BG readings of 90-160m/dl, plus patient specific correction dose for unexpected hyperglycemia above 1516mdl, associated with strict monitoring of glucose 4 times a day-before meals and at bedtime. - she is warned not to take insulin without proper monitoring per orders. - Adjustment parameters are given to her for hypo and hyperglycemia in writing. - she is encouraged to call clinic for blood glucose levels less than 70 or above 200 mg /dl. -This patient will benefit from a CGM.  I discussed and prescribed Dexcom  G7 device for her. She has used this device in the past. -Her diabetes will be  managed exclusively with insulin through the course of her pregnancy.    - Specific targets for  A1c;  LDL, HDL,  and Triglycerides were discussed with the patient.  2) Blood Pressure /Hypertension:  her blood pressure is  controlled to target.   she is advised to continue her current medications including labetalol 100 mg twice a day as per her providers of obstetrics.    3) Lipids/Hyperlipidemia:   No lipid panels to review.  She will be considered for lipid panels on subsequent visits.     4)  Weight/Diet:  Body mass index is 41.5 kg/m.  -   clearly complicating her diabetes care.  On long-term, she is  a candidate for weight loss. The above detailed  ACLM recommendations for  nutrition, exercise, sleep, social life, avoidance of risky substances, the need for restorative sleep   information will also detailed on discharge instructions.  5) Chronic Care/Health Maintenance:  -she  is encouraged to initiate and continue to follow up with Ophthalmology, Dentist,  Podiatrist at least yearly or according to recommendations, and advised to  quit smoking/vaping.  I have recommended yearly flu vaccine and pneumonia vaccine at least every 5 years; moderate intensity exercise for up to 150 minutes weekly; and  sleep for 7- 9 hours a day.  - she is  advised to maintain close follow up with Royston Sinner, Colin Benton, MD for primary care needs, as well as her other providers for optimal and coordinated care.   I spent 65 minutes in the care of the patient today including review of labs from [facility], Lipids, Thyroid Function, Hematology (current and previous including abstractions from other facilities); face-to-face time discussing  her blood glucose readings/logs, discussing hypoglycemia and hyperglycemia episodes and symptoms, medications doses, her options of short and long term treatment based on the latest standards of care / guidelines;  discussion about incorporating lifestyle medicine;  and documenting the encounter. Risk reduction counseling performed per USPSTF guidelines to reduce obesity and cardiovascular risk factors.      Please refer to Patient Instructions for Blood Glucose Monitoring and Insulin/Medications Dosing Guide"  in media tab for additional information. Please  also refer to " Patient Self Inventory" in the Media  tab for reviewed elements of pertinent patient history.  Kelli Mcpherson participated in the discussions, expressed understanding, and voiced agreement with the above plans.  All questions were answered to her satisfaction. she is encouraged to contact clinic should she have any questions or concerns prior to her return visit.   Follow up plan: -  Return in about 1 week (around 09/03/2021) for F/U with Meter/CGM [facility] Only - no Labs.  Glade Lloyd, MD [facility] [address] [place], [place] [address] Phone: [phone]  Fax: [phone]    08/27/2021, 5:39 PM  This note was partially dictated with voice recognition software. Similar sounding words can be transcribed inadequately or may not  be corrected upon review.

## 2021-08-27 NOTE — Patient Instructions (Signed)
                                     Advice for Weight Management  -For most of us the best way to lose weight is by diet management. Generally speaking, diet management means consuming less calories intentionally which over time brings about progressive weight loss.  This can be achieved more effectively by avoiding ultra processed carbohydrates, processed meats, unhealthy fats.    It is critically important to know your numbers: how much calorie you are consuming and how much calorie you need. More importantly, our carbohydrates sources should be unprocessed naturally occurring  complex starch food items.  It is always important to balance nutrition also by  appropriate intake of proteins (mainly plant-based), healthy fats/oils, plenty of fruits and vegetables.   -The American College of Lifestyle Medicine (ACL M) recommends nutrition derived mostly from Whole Food, Plant Predominant Sources example an apple instead of applesauce or apple pie. Eat Plenty of vegetables, Mushrooms, fruits, Legumes, Whole Grains, Nuts, seeds in lieu of processed meats, processed snacks/pastries red meat, poultry, eggs.  Use only water or unsweetened tea for hydration.  The College also recommends the need to stay away from risky substances including alcohol, smoking; obtaining 7-9 hours of restorative sleep, at least 150 minutes of moderate intensity exercise weekly, importance of healthy social connections, and being mindful of stress and seek help when it is overwhelming.    -Sticking to a routine mealtime to eat 3 meals a day and avoiding unnecessary snacks is shown to have a big role in weight control. Under normal circumstances, the only time we burn stored energy is when we are hungry, so allow  some hunger to take place- hunger means no food between appropriate meal times, only water.  It is not advisable to starve.   -It is better to avoid simple carbohydrates including:  Cakes, Sweet Desserts, Ice Cream, Soda (diet and regular), Sweet Tea, Candies, Chips, Cookies, Store Bought Juices, Alcohol in Excess of  1-2 drinks a day, Lemonade,  Artificial Sweeteners, Doughnuts, Coffee Creamers, "Sugar-free" Products, etc, etc.  This is not a complete list.....    -Consulting with certified diabetes educators is proven to provide you with the most accurate and current information on diet.  Also, you may be  interested in discussing diet options/exchanges , we can schedule a visit with Kelli Mcpherson, RDN, CDE for individualized nutrition education.  -Exercise: If you are able: 30 -60 minutes a day ,4 days a week, or 150 minutes of moderate intensity exercise weekly.    The longer the better if tolerated.  Combine stretch, strength, and aerobic activities.  If you were told in the past that you have high risk for cardiovascular diseases, or if you are currently symptomatic, you may seek evaluation by your heart doctor prior to initiating moderate to intense exercise programs.                                  Additional Care Considerations for Diabetes/Prediabetes   -Diabetes  is a chronic disease.  The most important care consideration is regular follow-up with your diabetes care provider with the goal being avoiding or delaying its complications and to take advantage of advances in medications and technology.  If appropriate actions are taken early enough, type 2 diabetes can even be   reversed.  Seek information from the right source.  - Whole Food, Plant Predominant Nutrition is highly recommended: Eat Plenty of vegetables, Mushrooms, fruits, Legumes, Whole Grains, Nuts, seeds in lieu of processed meats, processed snacks/pastries red meat, poultry, eggs as recommended by American College of  Lifestyle Medicine (ACLM).  -Type 2 diabetes is known to coexist with other important comorbidities such as high blood pressure and high cholesterol.  It is critical to control not only the  diabetes but also the high blood pressure and high cholesterol to minimize and delay the risk of complications including coronary artery disease, stroke, amputations, blindness, etc.  The good news is that this diet recommendation for type 2 diabetes is also very helpful for managing high cholesterol and high blood blood pressure.  - Studies showed that people with diabetes will benefit from a class of medications known as ACE inhibitors and statins.  Unless there are specific reasons not to be on these medications, the standard of care is to consider getting one from these groups of medications at an optimal doses.  These medications are generally considered safe and proven to help protect the heart and the kidneys.    - People with diabetes are encouraged to initiate and maintain regular follow-up with eye doctors, foot doctors, dentists , and if necessary heart and kidney doctors.     - It is highly recommended that people with diabetes quit smoking or stay away from smoking, and get yearly  flu vaccine and pneumonia vaccine at least every 5 years.  See above for additional recommendations on exercise, sleep, stress management , and healthy social connections.      

## 2021-09-03 ENCOUNTER — Other Ambulatory Visit: Payer: Managed Care, Other (non HMO)

## 2021-09-04 ENCOUNTER — Encounter (HOSPITAL_COMMUNITY): Payer: Self-pay | Admitting: Obstetrics and Gynecology

## 2021-09-04 ENCOUNTER — Ambulatory Visit: Payer: Managed Care, Other (non HMO) | Admitting: "Endocrinology

## 2021-09-04 ENCOUNTER — Inpatient Hospital Stay (HOSPITAL_COMMUNITY)
Admission: AD | Admit: 2021-09-04 | Discharge: 2021-09-04 | Disposition: A | Discharge status: Home / Self Care | Payer: Managed Care, Other (non HMO) | Attending: Obstetrics and Gynecology | Admitting: Obstetrics and Gynecology

## 2021-09-04 DIAGNOSIS — O09292 Supervision of pregnancy with other poor reproductive or obstetric history, second trimester: Secondary | ICD-10-CM | POA: Diagnosis not present

## 2021-09-04 DIAGNOSIS — O26892 Other specified pregnancy related conditions, second trimester: Secondary | ICD-10-CM

## 2021-09-04 DIAGNOSIS — Z202 Contact with and (suspected) exposure to infections with a predominantly sexual mode of transmission: Secondary | ICD-10-CM | POA: Diagnosis not present

## 2021-09-04 DIAGNOSIS — N898 Other specified noninflammatory disorders of vagina: Secondary | ICD-10-CM

## 2021-09-04 DIAGNOSIS — Z3A14 14 weeks gestation of pregnancy: Secondary | ICD-10-CM

## 2021-09-04 DIAGNOSIS — O24112 Pre-existing diabetes mellitus, type 2, in pregnancy, second trimester: Secondary | ICD-10-CM | POA: Insufficient documentation

## 2021-09-04 DIAGNOSIS — O209 Hemorrhage in early pregnancy, unspecified: Secondary | ICD-10-CM | POA: Diagnosis present

## 2021-09-04 DIAGNOSIS — O99332 Smoking (tobacco) complicating pregnancy, second trimester: Secondary | ICD-10-CM | POA: Insufficient documentation

## 2021-09-04 HISTORY — DX: Unspecified abnormal cytological findings in specimens from vagina: R87.629

## 2021-09-04 HISTORY — DX: Urinary tract infection, site not specified: N39.0

## 2021-09-04 LAB — URINALYSIS, ROUTINE W REFLEX MICROSCOPIC
Bilirubin Urine: NEGATIVE
Glucose, UA: >=500 mg/dL — AB
Hgb urine dipstick: NEGATIVE
Ketones, ur: NEGATIVE mg/dL
Leukocytes,Ua: NEGATIVE
Nitrite: NEGATIVE
Protein, ur: NEGATIVE mg/dL
Specific Gravity, Urine: 1.029 (ref 1.005–1.030)
pH: 5 (ref 5.0–8.0)

## 2021-09-04 LAB — WET PREP, GENITAL
Clue Cells Wet Prep HPF POC: NONE SEEN
Sperm: NONE SEEN
Trich, Wet Prep: NONE SEEN
WBC, Wet Prep HPF POC: <10 (ref <10)
Yeast Wet Prep HPF POC: NONE SEEN

## 2021-09-04 LAB — HEPATITIS B SURFACE ANTIGEN: Hepatitis B Surface Ag: NONREACTIVE

## 2021-09-04 LAB — HEPATITIS C ANTIBODY: HCV Ab: NONREACTIVE

## 2021-09-04 LAB — HIV ANTIBODY (ROUTINE TESTING W REFLEX): HIV Screen 4th Generation wRfx: NONREACTIVE

## 2021-09-04 MED ORDER — ACETAMINOPHEN 500 MG PO TABS
1000.0000 mg | ORAL_TABLET | Freq: Once | ORAL | Status: AC
Start: 2021-09-04 — End: 2021-09-04
  Administered 2021-09-04: 1000 mg via ORAL
  Filled 2021-09-04: qty 2

## 2021-09-04 NOTE — Discharge Instructions (Signed)

## 2021-09-04 NOTE — MAU Note (Signed)
Kelli Mcpherson is a 26 y.o. at [redacted]w[redacted]d here in MAU reporting: was at work and her heart started racing (has since stopped). Feels like she pulled a muscle in her chest. (Points to sternum).  Having d/c also mucous, slight odor, requesting 'swab tests'. No bleeding.   Onset of complaint: 1500 Pain score: 8 Vitals:   09/04/21 1631  BP: 139/75  Pulse: 69  Resp: 18  Temp: 99 F (37.2 C)  SpO2: 98%     FHT:158 Lab orders placed from triage:

## 2021-09-04 NOTE — MAU Provider Note (Signed)
History     CSN: [id number]  Arrival date and time: 09/04/21 1603   Event Date/Time   First Provider Initiated Contact with Patient 09/04/21 1722      Chief Complaint  Patient presents with   Vaginal Bleeding   HPI  Kelli Mcpherson is a 26 y.o. G2P0101 at [redacted]w[redacted]d who presents for evaluation of vaginal discharge. Patient reports she found out that her partner is having an affair with a man and when she found out, she felt like her chest was tight and heart was racing. She states those symptoms have resolved. She states she is having some vaginal discharge and requesting STD testing.  She denies any pain. She denies any vaginal bleeding, discharge, and leaking of fluid. Denies any constipation, diarrhea or any urinary complaints. Reports normal fetal movement.   OB History     Gravida  2   Para  1   Term      Preterm  1   AB      Living  1      SAB      IAB      Ectopic      Multiple  0   Live Births  1           Past Medical History:  Diagnosis Date   Chlamydia    COVID-19    DKA (diabetic ketoacidosis) (HCC) 05/29/2019   Eczema    Genital herpes    Gonorrhea    Pre-eclampsia 06/08/2019   Preeclampsia    Type 2 diabetes mellitus (HCC)    UTI (urinary tract infection)    Vaginal Pap smear, abnormal     Past Surgical History:  Procedure Laterality Date   WISDOM TOOTH EXTRACTION      Family History  Problem Relation Age of Onset   Diabetes Mother    Hypertension Mother    Hypertension Father    Asthma Neg Hx    Cancer Neg Hx    Birth defects Neg Hx    Heart disease Neg Hx    Stroke Neg Hx     Social History   Tobacco Use   Smoking status: Never   Smokeless tobacco: Never   Tobacco comments:    Patient occasionally smokes hookah   Vaping Use   Vaping Use: Former   Substances: Nicotine, Flavoring  Substance Use Topics   Alcohol use: Not Currently    Comment: socially   Drug use: Never    Allergies: No Known Allergies  No  medications prior to admission.    Review of Systems  Constitutional: Negative.  Negative for fatigue and fever.  HENT: Negative.    Respiratory: Negative.  Negative for shortness of breath.   Cardiovascular: Negative.  Negative for chest pain.  Gastrointestinal: Negative.  Negative for abdominal pain, constipation, diarrhea, nausea and vomiting.  Genitourinary:  Positive for vaginal discharge. Negative for dysuria and vaginal bleeding.  Neurological: Negative.  Negative for dizziness and headaches.   Physical Exam   Blood pressure (!) 142/88, pulse 76, temperature 99 F (37.2 C), temperature source Oral, resp. rate 16, height 6' (1.829 m), weight (!) 136.3 kg, last menstrual period 05/28/2021, SpO2 99 %.  Patient Vitals for the past 24 hrs:  BP Temp Temp src Pulse Resp SpO2 Height Weight  09/04/21 1915 (!) 142/88 -- -- 76 -- -- -- --  09/04/21 1740 -- -- -- 72 16 99 % -- --  09/04/21 1730 -- -- -- 76 -- 99 % -- --  09/04/21 1715 -- -- -- 78 -- 99 % -- --  09/04/21 1631 139/75 99 F (37.2 C) Oral 69 18 98 % 6' (1.829 m) (!) 136.3 kg    Physical Exam Vitals and nursing note reviewed.  Constitutional:      General: She is not in acute distress.    Appearance: She is well-developed.  HENT:     Head: Normocephalic.  Eyes:     Pupils: Pupils are equal, round, and reactive to light.  Cardiovascular:     Rate and Rhythm: Normal rate and regular rhythm.     Heart sounds: Normal heart sounds.  Pulmonary:     Effort: Pulmonary effort is normal. No respiratory distress.     Breath sounds: Normal breath sounds.  Abdominal:     General: Bowel sounds are normal. There is no distension.     Palpations: Abdomen is soft.     Tenderness: There is no abdominal tenderness.  Skin:    General: Skin is warm and dry.  Neurological:     Mental Status: She is alert and oriented to person, place, and time.  Psychiatric:        Mood and Affect: Mood normal.        Behavior: Behavior normal.         Thought Content: Thought content normal.        Judgment: Judgment normal.    FHT: 158 bpm  MAU Course  Procedures  Results for orders placed or performed during the hospital encounter of 09/04/21 (from the past 24 hour(s))  Urinalysis, Routine w reflex microscopic     Status: Abnormal   Collection Time: 09/04/21  5:37 PM  Result Value Ref Range   Color, Urine YELLOW YELLOW   APPearance HAZY (A) CLEAR   Specific Gravity, Urine 1.029 1.005 - 1.030   pH 5.0 5.0 - 8.0   Glucose, UA >=500 (A) NEGATIVE mg/dL   Hgb urine dipstick NEGATIVE NEGATIVE   Bilirubin Urine NEGATIVE NEGATIVE   Ketones, ur NEGATIVE NEGATIVE mg/dL   Protein, ur NEGATIVE NEGATIVE mg/dL   Nitrite NEGATIVE NEGATIVE   Leukocytes,Ua NEGATIVE NEGATIVE   RBC / HPF 6-10 0 - 5 RBC/hpf   WBC, UA 0-5 0 - 5 WBC/hpf   Bacteria, UA RARE (A) NONE SEEN   Squamous Epithelial / LPF 0-5 0 - 5   Mucus PRESENT    Ca Oxalate Crys, UA PRESENT   Hepatitis B surface antigen     Status: None   Collection Time: 09/04/21  5:38 PM  Result Value Ref Range   Hepatitis B Surface Ag NON REACTIVE NON REACTIVE  Wet prep, genital     Status: None   Collection Time: 09/04/21  5:56 PM  Result Value Ref Range   Yeast Wet Prep HPF POC NONE SEEN NONE SEEN   Trich, Wet Prep NONE SEEN NONE SEEN   Clue Cells Wet Prep HPF POC NONE SEEN NONE SEEN   WBC, Wet Prep HPF POC <10 <10   Sperm NONE SEEN     MDM Labs ordered and reviewed.   UA Wet prep and gc/chlamydia HIV, RPR, Hep B and Hep C Tylenol PO  Assessment and Plan   1. Vaginal discharge during pregnancy in second trimester   2. [redacted] weeks gestation of pregnancy   3. Exposure to STD     -Discharge home in stable condition -Second trimester precautions discussed -Patient advised to follow-up with OB as scheduled for prenatal care -Patient may return to  MAU as needed or if her condition were to change or worsen  Rolm Bookbinder, CNM 09/04/2021, 7:22 PM

## 2021-09-05 ENCOUNTER — Other Ambulatory Visit: Payer: Self-pay

## 2021-09-05 ENCOUNTER — Ambulatory Visit (INDEPENDENT_AMBULATORY_CARE_PROVIDER_SITE_OTHER): Payer: Managed Care, Other (non HMO) | Admitting: Registered"

## 2021-09-05 ENCOUNTER — Encounter: Payer: Managed Care, Other (non HMO) | Attending: Obstetrics and Gynecology | Admitting: Registered"

## 2021-09-05 DIAGNOSIS — O24112 Pre-existing diabetes mellitus, type 2, in pregnancy, second trimester: Secondary | ICD-10-CM | POA: Insufficient documentation

## 2021-09-05 DIAGNOSIS — Z713 Dietary counseling and surveillance: Secondary | ICD-10-CM | POA: Insufficient documentation

## 2021-09-05 DIAGNOSIS — Z794 Long term (current) use of insulin: Secondary | ICD-10-CM | POA: Insufficient documentation

## 2021-09-05 DIAGNOSIS — Z3A14 14 weeks gestation of pregnancy: Secondary | ICD-10-CM | POA: Diagnosis not present

## 2021-09-05 DIAGNOSIS — O24111 Pre-existing diabetes mellitus, type 2, in pregnancy, first trimester: Secondary | ICD-10-CM

## 2021-09-05 LAB — GC/CHLAMYDIA PROBE AMP (~~LOC~~) NOT AT ARMC
Chlamydia: NEGATIVE
Comment: NEGATIVE
Comment: NORMAL
Neisseria Gonorrhea: NEGATIVE

## 2021-09-05 LAB — RPR: RPR Ser Ql: NONREACTIVE

## 2021-09-05 NOTE — Progress Notes (Signed)
Patient was seen for T2D in pregnancy management on 09/05/21  Start time 1025 and End time 1117   Estimated due date: 03/04/22; [redacted]w[redacted]d  Clinical: Medications: Humalog 10 u with meals, Levemir 40 u qhs  Medical History: reviewed Labs: OGTT n/a, A1c 10.1%   Dietary and Lifestyle History: Pt report she needs to find an endocrinologist in [place] to manage T2D because Dr. Fransico Him in [place] is too far of a commute for patient.  Pt states she knows how to eat to control blood sugar and is mostly interested in finding out about the insulin pump. Today's visit was moslty to discuss options for blood sugar control and what to expect during pregnancy with related hormonal changes  NUTRITION INTERVENTION  (Pt did not appear interested in having this education since she states she has a good understanding of diet for diabetes)  Nutrition education (E-1) on the following topics:   Initial Follow-up  []  []  Definition of Gestational Diabetes []  []  Why dietary management is important in controlling blood glucose []  []  Effects each nutrient has on blood glucose levels []  []  Simple carbohydrates vs complex carbohydrates []  []  Fluid intake []  []  Creating a balanced meal plan []  []  Carbohydrate counting  []  []  When to check blood glucose levels []  []  Proper blood glucose monitoring techniques []  []  Effect of stress and stress reduction techniques  []  []  Exercise effect on blood glucose levels, appropriate exercise during pregnancy []  []  Importance of limiting caffeine and abstaining from alcohol and smoking []  []  Medications used for blood sugar control during pregnancy []  []  Hypoglycemia and rule of 15 []  []  Postpartum self care   Patient instructed to monitor glucose levels: FBS: 60 - ? 95 mg/dL 2 hour: ? mg/dL  Patient received handouts: none  Patient will be seen for follow-up as needed.

## 2021-09-10 DIAGNOSIS — O24111 Pre-existing diabetes mellitus, type 2, in pregnancy, first trimester: Secondary | ICD-10-CM | POA: Insufficient documentation

## 2021-09-19 ENCOUNTER — Encounter (HOSPITAL_COMMUNITY): Payer: Self-pay | Admitting: Obstetrics and Gynecology

## 2021-09-19 ENCOUNTER — Inpatient Hospital Stay (HOSPITAL_COMMUNITY)
Admission: AD | Admit: 2021-09-19 | Discharge: 2021-09-19 | Disposition: A | Discharge status: Home / Self Care | Payer: Managed Care, Other (non HMO) | Attending: Obstetrics and Gynecology | Admitting: Obstetrics and Gynecology

## 2021-09-19 DIAGNOSIS — O98312 Other infections with a predominantly sexual mode of transmission complicating pregnancy, second trimester: Secondary | ICD-10-CM | POA: Insufficient documentation

## 2021-09-19 DIAGNOSIS — O209 Hemorrhage in early pregnancy, unspecified: Secondary | ICD-10-CM | POA: Insufficient documentation

## 2021-09-19 DIAGNOSIS — A6009 Herpesviral infection of other urogenital tract: Secondary | ICD-10-CM | POA: Diagnosis not present

## 2021-09-19 DIAGNOSIS — O98512 Other viral diseases complicating pregnancy, second trimester: Secondary | ICD-10-CM | POA: Diagnosis not present

## 2021-09-19 DIAGNOSIS — Z3A16 16 weeks gestation of pregnancy: Secondary | ICD-10-CM | POA: Insufficient documentation

## 2021-09-19 DIAGNOSIS — O26892 Other specified pregnancy related conditions, second trimester: Secondary | ICD-10-CM | POA: Diagnosis not present

## 2021-09-19 DIAGNOSIS — B009 Herpesviral infection, unspecified: Secondary | ICD-10-CM | POA: Diagnosis not present

## 2021-09-19 DIAGNOSIS — O24112 Pre-existing diabetes mellitus, type 2, in pregnancy, second trimester: Secondary | ICD-10-CM | POA: Insufficient documentation

## 2021-09-19 LAB — URINALYSIS, ROUTINE W REFLEX MICROSCOPIC
Bilirubin Urine: NEGATIVE
Glucose, UA: NEGATIVE mg/dL
Ketones, ur: 20 mg/dL — AB
Nitrite: NEGATIVE
Protein, ur: 30 mg/dL — AB
Specific Gravity, Urine: 1.028 (ref 1.005–1.030)
WBC, UA: >50 WBC/hpf — ABNORMAL HIGH (ref 0–5)
pH: 5 (ref 5.0–8.0)

## 2021-09-19 LAB — WET PREP, GENITAL
Clue Cells Wet Prep HPF POC: NONE SEEN
Sperm: NONE SEEN
Trich, Wet Prep: NONE SEEN
WBC, Wet Prep HPF POC: <10 (ref <10)
Yeast Wet Prep HPF POC: NONE SEEN

## 2021-09-19 NOTE — MAU Note (Signed)
.  Kelli Mcpherson is a 26 y.o. at [redacted]w[redacted]d here in MAU reporting: she noticed a moderate amount of blood (the amount you would have during a period) when using the restroom at work around 1915. She has not been wearing a pad. She is also reporting constant lower abdominal pain 6/10 and lower back pain 8/10. She reports cloudy, thick, odorous vaginal discharge that started yesterday. Last intercourse was two days ago.   Has cHTN and takes Labetalol and baby aspirin.   Onset of complaint: Today Pain score: 6/10, 8/10 Vitals:   09/19/21 2007  BP: (!) 157/80  Pulse: 93  Resp: 18  Temp: 98.9 F (37.2 C)  SpO2: 100%     FHT:152 Lab orders placed from triage:  UA

## 2021-09-19 NOTE — Discharge Instructions (Signed)

## 2021-09-19 NOTE — MAU Provider Note (Signed)
History     CSN: [id number]  Arrival date and time: 09/19/21 1952   Event Date/Time   First Provider Initiated Contact with Patient 09/19/21 2026     Chief Complaint  Patient presents with   Abdominal Pain   Vaginal Bleeding   HPI  Kelli Mcpherson is a 26 y.o. G2P0101 at [redacted]w[redacted]d who presents for evaluation of vaginal bleeding. Patient reports at 1915 she had a gush of bright red bleeding one time. She has not had any more bleeding. After further questioning, patient states she is here because she believes she has a HSV outbreak. Her last outbreak was a month ago. She denies any pain She denies any vaginal bleeding, discharge, and leaking of fluid. Denies any constipation, diarrhea or any urinary complaints.   OB History     Gravida  2   Para  1   Term      Preterm  1   AB      Living  1      SAB      IAB      Ectopic      Multiple  0   Live Births  1           Past Medical History:  Diagnosis Date   Chlamydia    COVID-19    DKA (diabetic ketoacidosis) (HCC) 05/29/2019   Eczema    Genital herpes    Gonorrhea    Pre-eclampsia 06/08/2019   Preeclampsia    Type 2 diabetes mellitus (HCC)    UTI (urinary tract infection)    Vaginal Pap smear, abnormal     Past Surgical History:  Procedure Laterality Date   WISDOM TOOTH EXTRACTION      Family History  Problem Relation Age of Onset   Diabetes Mother    Hypertension Mother    Hypertension Father    Asthma Neg Hx    Cancer Neg Hx    Birth defects Neg Hx    Heart disease Neg Hx    Stroke Neg Hx     Social History   Tobacco Use   Smoking status: Never   Smokeless tobacco: Never   Tobacco comments:    Patient occasionally smokes hookah   Vaping Use   Vaping Use: Former   Substances: Nicotine, Flavoring  Substance Use Topics   Alcohol use: Not Currently    Comment: socially   Drug use: Never    Allergies: No Known Allergies  Medications Prior to Admission  Medication Sig  Dispense Refill Last Dose   aspirin EC 81 MG tablet Take 81 mg by mouth 2 (two) times daily. Swallow whole.   09/19/2021   insulin detemir (LEVEMIR FLEXTOUCH) 100 UNIT/ML FlexPen Inject 40 Units into the skin at bedtime. 15 mL 2 09/19/2021   insulin lispro (HUMALOG KWIKPEN) 100 UNIT/ML KwikPen Inject 10-16 Units into the skin 3 (three) times daily before meals. 15 mL 2 09/19/2021   labetalol (NORMODYNE) 100 MG tablet Take 1 tablet (100 mg total) by mouth 2 (two) times daily. 60 tablet 0 09/19/2021   Prenatal Multivit-Min-FA ([facility] PRENATAL GUMMY) 0.4 MG CHEW Chew 1 Units by mouth daily. 30 tablet 10 09/19/2021   valACYclovir (VALTREX) 500 MG tablet Take 1 tablet (500 mg total) by mouth 2 (two) times daily. Take for 3 days with onset of outbreak 6 tablet 10 Past Month   Continuous Blood Gluc Receiver (DEXCOM G7 RECEIVER) DEVI Use to monitor BG continuously 1 each 0    Continuous  Blood Gluc Sensor (DEXCOM G7 SENSOR) MISC Change sensor every 10 days 3 each 2    Insulin Syringe-Needle U-100 29G X 1/2" 0.3 ML MISC Use 4 times per day to administer insulin 200 each 0     Review of Systems  Constitutional: Negative.  Negative for fatigue and fever.  HENT: Negative.    Respiratory: Negative.  Negative for shortness of breath.   Cardiovascular: Negative.  Negative for chest pain.  Gastrointestinal: Negative.  Negative for abdominal pain, constipation, diarrhea, nausea and vomiting.  Genitourinary:  Positive for vaginal bleeding. Negative for dysuria and vaginal discharge.  Neurological: Negative.  Negative for dizziness and headaches.   Physical Exam   Blood pressure (!) 150/75, pulse 86, temperature 98.9 F (37.2 C), temperature source Oral, resp. rate 18, height 6' (1.829 m), weight (!) 138.8 kg, last menstrual period 05/28/2021, SpO2 100 %.  Patient Vitals for the past 24 hrs:  BP Temp Temp src Pulse Resp SpO2 Height Weight  09/19/21 2017 (!) 150/75 -- -- 86 -- -- -- --  09/19/21 2007 (!) 157/80 98.9  F (37.2 C) Oral 93 18 100 % 6' (1.829 m) (!) 138.8 kg    Physical Exam Vitals and nursing note reviewed.  Constitutional:      General: She is not in acute distress.    Appearance: She is well-developed.  HENT:     Head: Normocephalic.  Eyes:     Pupils: Pupils are equal, round, and reactive to light.  Cardiovascular:     Rate and Rhythm: Normal rate and regular rhythm.     Heart sounds: Normal heart sounds.  Pulmonary:     Effort: Pulmonary effort is normal. No respiratory distress.     Breath sounds: Normal breath sounds.  Abdominal:     General: Bowel sounds are normal. There is no distension.     Palpations: Abdomen is soft.     Tenderness: There is no abdominal tenderness.  Genitourinary:    Comments: Pelvic exam: Cervix pink, visually closed, without lesion, scant white creamy discharge, fissures noted at introitus   Skin:    General: Skin is warm and dry.  Neurological:     Mental Status: She is alert and oriented to person, place, and time.  Psychiatric:        Mood and Affect: Mood normal.        Behavior: Behavior normal.        Thought Content: Thought content normal.        Judgment: Judgment normal.     FHT: 152 bpm   MAU Course  Procedures  Results for orders placed or performed during the hospital encounter of 09/19/21 (from the past 24 hour(s))  Wet prep, genital     Status: None   Collection Time: 09/19/21  8:38 PM  Result Value Ref Range   Yeast Wet Prep HPF POC NONE SEEN NONE SEEN   Trich, Wet Prep NONE SEEN NONE SEEN   Clue Cells Wet Prep HPF POC NONE SEEN NONE SEEN   WBC, Wet Prep HPF POC <10 <10   Sperm NONE SEEN     MDM Labs ordered and reviewed.   Wet prep and gc/chlamydia  Patient reports fissures are her normal presentation of HSV. Treatment options reviewed. Patient has 10 refills of valtrex at pharmacy. CNM encouraged patient to pick up.  Assessment and Plan   1. 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 [redacted] weeks gestation  of pregnancy     -Discharge home in stable condition -Second trimester precautions discussed -Patient advised to follow-up with OB as scheduled for prenatal care -Patient may return to MAU as needed or if her condition were to change or worsen  Rolm Bookbinder, CNM 09/19/2021, 9:09 PM

## 2021-09-20 LAB — GC/CHLAMYDIA PROBE AMP (~~LOC~~) NOT AT ARMC
Chlamydia: NEGATIVE
Comment: NEGATIVE
Comment: NORMAL
Neisseria Gonorrhea: NEGATIVE

## 2021-10-08 ENCOUNTER — Ambulatory Visit: Payer: Managed Care, Other (non HMO) | Attending: Obstetrics

## 2021-10-08 ENCOUNTER — Ambulatory Visit: Payer: Managed Care, Other (non HMO) | Admitting: *Deleted

## 2021-10-08 ENCOUNTER — Encounter: Payer: Self-pay | Admitting: *Deleted

## 2021-10-08 ENCOUNTER — Other Ambulatory Visit: Payer: Self-pay | Admitting: *Deleted

## 2021-10-08 DIAGNOSIS — O10911 Unspecified pre-existing hypertension complicating pregnancy, first trimester: Secondary | ICD-10-CM | POA: Insufficient documentation

## 2021-10-08 DIAGNOSIS — O09299 Supervision of pregnancy with other poor reproductive or obstetric history, unspecified trimester: Secondary | ICD-10-CM

## 2021-10-08 DIAGNOSIS — E669 Obesity, unspecified: Secondary | ICD-10-CM

## 2021-10-08 DIAGNOSIS — O24112 Pre-existing diabetes mellitus, type 2, in pregnancy, second trimester: Secondary | ICD-10-CM

## 2021-10-08 DIAGNOSIS — O09212 Supervision of pregnancy with history of pre-term labor, second trimester: Secondary | ICD-10-CM

## 2021-10-08 DIAGNOSIS — O24119 Pre-existing diabetes mellitus, type 2, in pregnancy, unspecified trimester: Secondary | ICD-10-CM

## 2021-10-08 DIAGNOSIS — O09899 Supervision of other high risk pregnancies, unspecified trimester: Secondary | ICD-10-CM

## 2021-10-08 DIAGNOSIS — O10012 Pre-existing essential hypertension complicating pregnancy, second trimester: Secondary | ICD-10-CM

## 2021-10-08 DIAGNOSIS — O10919 Unspecified pre-existing hypertension complicating pregnancy, unspecified trimester: Secondary | ICD-10-CM

## 2021-10-08 DIAGNOSIS — O99212 Obesity complicating pregnancy, second trimester: Secondary | ICD-10-CM

## 2021-10-08 DIAGNOSIS — Z3A19 19 weeks gestation of pregnancy: Secondary | ICD-10-CM

## 2021-10-08 DIAGNOSIS — Z362 Encounter for other antenatal screening follow-up: Secondary | ICD-10-CM

## 2021-10-08 DIAGNOSIS — O98512 Other viral diseases complicating pregnancy, second trimester: Secondary | ICD-10-CM

## 2021-10-08 DIAGNOSIS — B009 Herpesviral infection, unspecified: Secondary | ICD-10-CM

## 2021-10-08 DIAGNOSIS — O09292 Supervision of pregnancy with other poor reproductive or obstetric history, second trimester: Secondary | ICD-10-CM

## 2021-10-17 ENCOUNTER — Inpatient Hospital Stay (HOSPITAL_COMMUNITY)
Admission: AD | Admit: 2021-10-17 | Discharge: 2021-10-17 | Disposition: A | Discharge status: Home / Self Care | Payer: Managed Care, Other (non HMO) | Attending: Obstetrics & Gynecology | Admitting: Obstetrics & Gynecology

## 2021-10-17 ENCOUNTER — Encounter (HOSPITAL_COMMUNITY): Payer: Self-pay | Admitting: Obstetrics & Gynecology

## 2021-10-17 DIAGNOSIS — O98512 Other viral diseases complicating pregnancy, second trimester: Secondary | ICD-10-CM | POA: Insufficient documentation

## 2021-10-17 DIAGNOSIS — O24112 Pre-existing diabetes mellitus, type 2, in pregnancy, second trimester: Secondary | ICD-10-CM | POA: Insufficient documentation

## 2021-10-17 DIAGNOSIS — Z79624 Long term (current) use of inhibitors of nucleotide synthesis: Secondary | ICD-10-CM | POA: Diagnosis not present

## 2021-10-17 DIAGNOSIS — Z794 Long term (current) use of insulin: Secondary | ICD-10-CM | POA: Diagnosis not present

## 2021-10-17 DIAGNOSIS — Z3A20 20 weeks gestation of pregnancy: Secondary | ICD-10-CM

## 2021-10-17 DIAGNOSIS — Z79899 Other long term (current) drug therapy: Secondary | ICD-10-CM | POA: Insufficient documentation

## 2021-10-17 DIAGNOSIS — O4692 Antepartum hemorrhage, unspecified, second trimester: Secondary | ICD-10-CM | POA: Diagnosis not present

## 2021-10-17 DIAGNOSIS — Z7982 Long term (current) use of aspirin: Secondary | ICD-10-CM | POA: Insufficient documentation

## 2021-10-17 LAB — URINALYSIS, ROUTINE W REFLEX MICROSCOPIC
Bilirubin Urine: NEGATIVE
Glucose, UA: NEGATIVE mg/dL
Hgb urine dipstick: NEGATIVE
Ketones, ur: NEGATIVE mg/dL
Leukocytes,Ua: NEGATIVE
Nitrite: NEGATIVE
Protein, ur: NEGATIVE mg/dL
Specific Gravity, Urine: 1.019 (ref 1.005–1.030)
pH: 7 (ref 5.0–8.0)

## 2021-10-17 LAB — WET PREP, GENITAL
Clue Cells Wet Prep HPF POC: NONE SEEN
Sperm: NONE SEEN
Trich, Wet Prep: NONE SEEN
WBC, Wet Prep HPF POC: <10 (ref <10)
Yeast Wet Prep HPF POC: NONE SEEN

## 2021-10-17 NOTE — MAU Note (Signed)
...  Kelli Mcpherson is a 26 y.o. at [redacted]w[redacted]d here in MAU reporting: Yesterday around 1900 while at work she went to the restroom and noted blood in the toilet and a light amount of blood on her tissue when she wiped. She denies seeing blood clots and reports she did not see any blood after this. She reports at 1030 this morning she went to use the restroom and noted mucous on her tissue that had a "little bit of blood in it." Denies experiencing any pain. Denies vaginal itching or vaginal odors. Last IC yesterday around 1030.  Taking Valtrex. Last dose last night.   Onset of complaint:  Pain score: Denies pain.  FHT: 160 doppler Lab orders placed from triage: UA

## 2021-10-17 NOTE — MAU Provider Note (Signed)
History     [id number]  Arrival date and time: 10/17/21 1125    Chief Complaint  Patient presents with   Vaginal Bleeding     HPI Kelli Mcpherson is a 26 y.o. at [redacted]w[redacted]d with PMHx notable for DM2, frequent HSV outbreaks, who presents for vaginal bleeding.    She reports that last night around 1900 she had some bleeding when she went to the bathroom to urinate, did not continue Then this morning she saw what looked like bloody discharge when she went to the bathroom and came to be evaluated She reports last intercourse yesterday morning Not currently vaginally bleeding, no leaking of fluid either No abdominal pain No burning or pain with urination No other vaginal discharge noted Thinks her HSV might be acting up but not sure     OB History     Gravida  2   Para  1   Term      Preterm  1   AB      Living  1      SAB      IAB      Ectopic      Multiple  0   Live Births  1           Past Medical History:  Diagnosis Date   Chlamydia    COVID-19    DKA (diabetic ketoacidosis) (HCC) 05/29/2019   Eczema    Genital herpes    Gonorrhea    Pre-eclampsia 06/08/2019   Preeclampsia    Type 2 diabetes mellitus (HCC)    UTI (urinary tract infection)    Vaginal Pap smear, abnormal     Past Surgical History:  Procedure Laterality Date   WISDOM TOOTH EXTRACTION      Family History  Problem Relation Age of Onset   Diabetes Mother    Hypertension Mother    Hypertension Father    Asthma Neg Hx    Cancer Neg Hx    Birth defects Neg Hx    Heart disease Neg Hx    Stroke Neg Hx     Social History   Socioeconomic History   Marital status: Single    Spouse name: Not on file   Number of children: Not on file   Years of education: Not on file   Highest education level: Not on file  Occupational History   Not on file  Tobacco Use   Smoking status: Never   Smokeless tobacco: Never   Tobacco comments:    Patient occasionally smokes hookah    Vaping Use   Vaping Use: Former   Substances: Nicotine, Flavoring  Substance and Sexual Activity   Alcohol use: Not Currently    Comment: socially   Drug use: Never   Sexual activity: Yes  Other Topics Concern   Not on file  Social History Narrative   Not on file   Social Determinants of Health   Financial Resource Strain: Not on file  Food Insecurity: No Food Insecurity (09/06/2020)   Hunger Vital Sign    Worried About Running Out of Food in the Last Year: Never true    Ran Out of Food in the Last Year: Never true  Transportation Needs: No Transportation Needs (09/06/2020)   PRAPARE - Administrator, Civil Service (Medical): No    Lack of Transportation (Non-Medical): No  Physical Activity: Not on file  Stress: Not on file  Social Connections: Not on file  Intimate Partner Violence: Not  on file    No Known Allergies  No current facility-administered medications on file prior to encounter.   Current Outpatient Medications on File Prior to Encounter  Medication Sig Dispense Refill   aspirin EC 81 MG tablet Take 81 mg by mouth 2 (two) times daily. Swallow whole.     Continuous Blood Gluc Receiver (DEXCOM G7 RECEIVER) DEVI Use to monitor BG continuously 1 each 0   Continuous Blood Gluc Sensor (DEXCOM G7 SENSOR) MISC Change sensor every 10 days 3 each 2   insulin detemir (LEVEMIR FLEXTOUCH) 100 UNIT/ML FlexPen Inject 40 Units into the skin at bedtime. 15 mL 2   insulin lispro (HUMALOG KWIKPEN) 100 UNIT/ML KwikPen Inject 10-16 Units into the skin 3 (three) times daily before meals. 15 mL 2   labetalol (NORMODYNE) 100 MG tablet Take 1 tablet (100 mg total) by mouth 2 (two) times daily. 60 tablet 0   Prenatal Multivit-Min-FA ([facility] PRENATAL GUMMY) 0.4 MG CHEW Chew 1 Units by mouth daily. 30 tablet 10   valACYclovir (VALTREX) 500 MG tablet Take 1 tablet (500 mg total) by mouth 2 (two) times daily. Take for 3 days with onset of outbreak 6 tablet 10   Insulin Syringe-Needle  U-100 29G X 1/2" 0.3 ML MISC Use 4 times per day to administer insulin 200 each 0   [DISCONTINUED] enalapril (VASOTEC) 10 MG tablet Take 1 tablet (10 mg total) by mouth daily. 30 tablet 0     ROS Pertinent positives and negative per HPI, all others reviewed and negative  Physical Exam   BP 135/69   Pulse 95   Temp 99.3 F (37.4 C) (Oral)   Resp 15   Ht 6' (1.829 m)   Wt (!) 140.2 kg   LMP 05/28/2021   SpO2 100%   BMI 41.92 kg/m   Patient Vitals for the past 24 hrs:  BP Temp Temp src Pulse Resp SpO2 Height Weight  10/17/21 1228 135/69 -- -- 95 -- -- -- --  10/17/21 1159 132/66 -- -- -- -- -- -- --  10/17/21 1138 139/70 99.3 F (37.4 C) Oral 96 15 100 % 6' (1.829 m) (!) 140.2 kg    Physical Exam Vitals reviewed.  Constitutional:      General: She is not in acute distress.    Appearance: She is well-developed. She is not diaphoretic.  Eyes:     General: No scleral icterus. Pulmonary:     Effort: Pulmonary effort is normal. No respiratory distress.  Abdominal:     General: There is no distension.     Palpations: Abdomen is soft.     Tenderness: There is no abdominal tenderness. There is no guarding or rebound.  Genitourinary:    Comments: External exam unremarkable without any lesions noted. Speculum exam showed normal vagina and cervix, small amount of brownish discharge, no red discharge, no blood. Cervix visually closed and long Skin:    General: Skin is warm and dry.  Neurological:     Mental Status: She is alert.     Coordination: Coordination normal.      Cervical Exam    Bedside Ultrasound Not done  My interpretation: n/a  FHT 160 bpm by doppler  Labs Results for orders placed or performed during the hospital encounter of 10/17/21 (from the past 24 hour(s))  Urinalysis, Routine w reflex microscopic Urine, Clean Catch     Status: None   Collection Time: 10/17/21 11:54 AM  Result Value Ref Range   Color, Urine YELLOW YELLOW  APPearance CLEAR  CLEAR   Specific Gravity, Urine 1.019 1.005 - 1.030   pH 7.0 5.0 - 8.0   Glucose, UA NEGATIVE NEGATIVE mg/dL   Hgb urine dipstick NEGATIVE NEGATIVE   Bilirubin Urine NEGATIVE NEGATIVE   Ketones, ur NEGATIVE NEGATIVE mg/dL   Protein, ur NEGATIVE NEGATIVE mg/dL   Nitrite NEGATIVE NEGATIVE   Leukocytes,Ua NEGATIVE NEGATIVE    Imaging No results found.  MAU Course  Procedures Lab Orders         Wet prep, genital         Urinalysis, Routine w reflex microscopic Urine, Clean Catch    No orders of the defined types were placed in this encounter.  Imaging Orders  No imaging studies ordered today    MDM moderate  Assessment and Plan  #Vaginal bleeding in pregnancy, second trimester #[redacted] weeks gestation of pregnancy Patient brings picture with possibly mildly blood mucous. Exam largely unremarkable, with possibly some brownish discharge/mucous but no blood seen. Suspect bleeding due to intercourse, currently without symptoms or concerning exam findings. Discussed etiology with patient, also collected swabs in case vaginitis is contributing. Patient elects for early discharge and to be called if results are abnormal.  #FWB 160 bpm by doppler   Dispo: discharged to home in stable condition.   Venora Maples, MD/MPH 10/17/21 12:34 PM  Allergies as of 10/17/2021   No Known Allergies      Medication List     TAKE these medications    aspirin EC 81 MG tablet Take 81 mg by mouth 2 (two) times daily. Swallow whole.   CVS Prenatal Gummy 0.4 MG Chew Chew 1 Units by mouth daily.   Dexcom G7 Receiver Devi Use to monitor BG continuously   Dexcom G7 Sensor Misc Change sensor every 10 days   insulin lispro 100 UNIT/ML KwikPen Commonly known as: HumaLOG KwikPen Inject 10-16 Units into the skin 3 (three) times daily before meals.   Insulin Syringe-Needle U-100 29G X 1/2" 0.3 ML Misc Use 4 times per day to administer insulin   labetalol 100 MG tablet Commonly known as:  NORMODYNE Take 1 tablet (100 mg total) by mouth 2 (two) times daily.   Levemir FlexTouch 100 UNIT/ML FlexPen Generic drug: insulin detemir Inject 40 Units into the skin at bedtime.   valACYclovir 500 MG tablet Commonly known as: Valtrex Take 1 tablet (500 mg total) by mouth 2 (two) times daily. Take for 3 days with onset of outbreak

## 2021-10-18 LAB — GC/CHLAMYDIA PROBE AMP (~~LOC~~) NOT AT ARMC
Chlamydia: NEGATIVE
Comment: NEGATIVE
Comment: NORMAL
Neisseria Gonorrhea: NEGATIVE

## 2021-11-12 ENCOUNTER — Ambulatory Visit: Payer: Managed Care, Other (non HMO) | Admitting: *Deleted

## 2021-11-12 ENCOUNTER — Telehealth: Payer: Self-pay | Admitting: "Endocrinology

## 2021-11-12 ENCOUNTER — Ambulatory Visit: Payer: Managed Care, Other (non HMO) | Attending: Maternal & Fetal Medicine

## 2021-11-12 VITALS — BP 131/64 | HR 88

## 2021-11-12 DIAGNOSIS — O09899 Supervision of other high risk pregnancies, unspecified trimester: Secondary | ICD-10-CM | POA: Diagnosis present

## 2021-11-12 DIAGNOSIS — O09292 Supervision of pregnancy with other poor reproductive or obstetric history, second trimester: Secondary | ICD-10-CM

## 2021-11-12 DIAGNOSIS — O98512 Other viral diseases complicating pregnancy, second trimester: Secondary | ICD-10-CM | POA: Diagnosis not present

## 2021-11-12 DIAGNOSIS — Z3A24 24 weeks gestation of pregnancy: Secondary | ICD-10-CM

## 2021-11-12 DIAGNOSIS — Z362 Encounter for other antenatal screening follow-up: Secondary | ICD-10-CM | POA: Diagnosis present

## 2021-11-12 DIAGNOSIS — O99212 Obesity complicating pregnancy, second trimester: Secondary | ICD-10-CM | POA: Insufficient documentation

## 2021-11-12 DIAGNOSIS — O10012 Pre-existing essential hypertension complicating pregnancy, second trimester: Secondary | ICD-10-CM | POA: Diagnosis not present

## 2021-11-12 DIAGNOSIS — O10919 Unspecified pre-existing hypertension complicating pregnancy, unspecified trimester: Secondary | ICD-10-CM | POA: Diagnosis present

## 2021-11-12 DIAGNOSIS — O09299 Supervision of pregnancy with other poor reproductive or obstetric history, unspecified trimester: Secondary | ICD-10-CM | POA: Diagnosis present

## 2021-11-12 DIAGNOSIS — O24119 Pre-existing diabetes mellitus, type 2, in pregnancy, unspecified trimester: Secondary | ICD-10-CM | POA: Diagnosis present

## 2021-11-12 DIAGNOSIS — E669 Obesity, unspecified: Secondary | ICD-10-CM

## 2021-11-12 DIAGNOSIS — O24112 Pre-existing diabetes mellitus, type 2, in pregnancy, second trimester: Secondary | ICD-10-CM

## 2021-11-12 DIAGNOSIS — B009 Herpesviral infection, unspecified: Secondary | ICD-10-CM

## 2021-11-12 NOTE — Telephone Encounter (Signed)
Received records again from her obgyn for pt to be seen. Called pt, she refused to drive to [place]. Left [facility] a VM of this and told the patient to call them as well.

## 2021-11-13 DIAGNOSIS — O24919 Unspecified diabetes mellitus in pregnancy, unspecified trimester: Secondary | ICD-10-CM | POA: Diagnosis not present

## 2021-11-21 ENCOUNTER — Other Ambulatory Visit: Payer: Self-pay | Admitting: "Endocrinology

## 2021-12-10 ENCOUNTER — Ambulatory Visit: Payer: Managed Care, Other (non HMO)

## 2021-12-25 ENCOUNTER — Ambulatory Visit: Payer: Managed Care, Other (non HMO)

## 2021-12-25 ENCOUNTER — Other Ambulatory Visit: Payer: Self-pay | Admitting: *Deleted

## 2021-12-25 ENCOUNTER — Ambulatory Visit: Payer: Managed Care, Other (non HMO) | Attending: Maternal & Fetal Medicine | Admitting: *Deleted

## 2021-12-25 VITALS — BP 124/68 | HR 89

## 2021-12-25 DIAGNOSIS — O24119 Pre-existing diabetes mellitus, type 2, in pregnancy, unspecified trimester: Secondary | ICD-10-CM

## 2021-12-25 DIAGNOSIS — O09213 Supervision of pregnancy with history of pre-term labor, third trimester: Secondary | ICD-10-CM

## 2021-12-25 DIAGNOSIS — O99213 Obesity complicating pregnancy, third trimester: Secondary | ICD-10-CM | POA: Insufficient documentation

## 2021-12-25 DIAGNOSIS — O09293 Supervision of pregnancy with other poor reproductive or obstetric history, third trimester: Secondary | ICD-10-CM | POA: Diagnosis not present

## 2021-12-25 DIAGNOSIS — E669 Obesity, unspecified: Secondary | ICD-10-CM | POA: Insufficient documentation

## 2021-12-25 DIAGNOSIS — O10013 Pre-existing essential hypertension complicating pregnancy, third trimester: Secondary | ICD-10-CM | POA: Diagnosis not present

## 2021-12-25 DIAGNOSIS — O10919 Unspecified pre-existing hypertension complicating pregnancy, unspecified trimester: Secondary | ICD-10-CM

## 2021-12-25 DIAGNOSIS — O10913 Unspecified pre-existing hypertension complicating pregnancy, third trimester: Secondary | ICD-10-CM

## 2021-12-25 DIAGNOSIS — O09899 Supervision of other high risk pregnancies, unspecified trimester: Secondary | ICD-10-CM

## 2021-12-25 DIAGNOSIS — O09299 Supervision of pregnancy with other poor reproductive or obstetric history, unspecified trimester: Secondary | ICD-10-CM

## 2021-12-25 DIAGNOSIS — Z362 Encounter for other antenatal screening follow-up: Secondary | ICD-10-CM

## 2021-12-25 DIAGNOSIS — O99212 Obesity complicating pregnancy, second trimester: Secondary | ICD-10-CM

## 2021-12-25 DIAGNOSIS — E119 Type 2 diabetes mellitus without complications: Secondary | ICD-10-CM | POA: Diagnosis not present

## 2021-12-25 DIAGNOSIS — O24112 Pre-existing diabetes mellitus, type 2, in pregnancy, second trimester: Secondary | ICD-10-CM | POA: Diagnosis not present

## 2021-12-25 DIAGNOSIS — O24113 Pre-existing diabetes mellitus, type 2, in pregnancy, third trimester: Secondary | ICD-10-CM | POA: Insufficient documentation

## 2021-12-25 DIAGNOSIS — O98513 Other viral diseases complicating pregnancy, third trimester: Secondary | ICD-10-CM | POA: Diagnosis not present

## 2021-12-25 DIAGNOSIS — Z3A30 30 weeks gestation of pregnancy: Secondary | ICD-10-CM | POA: Insufficient documentation

## 2021-12-25 DIAGNOSIS — B009 Herpesviral infection, unspecified: Secondary | ICD-10-CM

## 2021-12-25 DIAGNOSIS — Z794 Long term (current) use of insulin: Secondary | ICD-10-CM

## 2021-12-26 ENCOUNTER — Ambulatory Visit: Payer: Managed Care, Other (non HMO) | Attending: Obstetrics and Gynecology

## 2021-12-26 ENCOUNTER — Other Ambulatory Visit: Payer: Managed Care, Other (non HMO)

## 2021-12-31 ENCOUNTER — Other Ambulatory Visit: Payer: Self-pay | Admitting: "Endocrinology

## 2021-12-31 ENCOUNTER — Telehealth: Payer: Self-pay | Admitting: Obstetrics and Gynecology

## 2022-01-08 ENCOUNTER — Ambulatory Visit: Payer: Managed Care, Other (non HMO) | Attending: Maternal & Fetal Medicine

## 2022-01-08 ENCOUNTER — Ambulatory Visit: Payer: Managed Care, Other (non HMO) | Admitting: *Deleted

## 2022-01-08 VITALS — BP 130/62 | HR 89

## 2022-01-08 DIAGNOSIS — O10913 Unspecified pre-existing hypertension complicating pregnancy, third trimester: Secondary | ICD-10-CM | POA: Insufficient documentation

## 2022-01-08 DIAGNOSIS — O10013 Pre-existing essential hypertension complicating pregnancy, third trimester: Secondary | ICD-10-CM | POA: Diagnosis not present

## 2022-01-08 DIAGNOSIS — O09213 Supervision of pregnancy with history of pre-term labor, third trimester: Secondary | ICD-10-CM

## 2022-01-08 DIAGNOSIS — O99213 Obesity complicating pregnancy, third trimester: Secondary | ICD-10-CM | POA: Diagnosis not present

## 2022-01-08 DIAGNOSIS — O09293 Supervision of pregnancy with other poor reproductive or obstetric history, third trimester: Secondary | ICD-10-CM

## 2022-01-08 DIAGNOSIS — O24113 Pre-existing diabetes mellitus, type 2, in pregnancy, third trimester: Secondary | ICD-10-CM | POA: Diagnosis not present

## 2022-01-08 DIAGNOSIS — F17290 Nicotine dependence, other tobacco product, uncomplicated: Secondary | ICD-10-CM

## 2022-01-08 DIAGNOSIS — O99333 Smoking (tobacco) complicating pregnancy, third trimester: Secondary | ICD-10-CM

## 2022-01-08 DIAGNOSIS — B009 Herpesviral infection, unspecified: Secondary | ICD-10-CM

## 2022-01-08 DIAGNOSIS — Z3A32 32 weeks gestation of pregnancy: Secondary | ICD-10-CM

## 2022-01-08 DIAGNOSIS — O98513 Other viral diseases complicating pregnancy, third trimester: Secondary | ICD-10-CM

## 2022-01-15 ENCOUNTER — Ambulatory Visit: Payer: Managed Care, Other (non HMO) | Attending: Obstetrics and Gynecology | Admitting: *Deleted

## 2022-01-15 ENCOUNTER — Ambulatory Visit: Payer: Managed Care, Other (non HMO) | Admitting: *Deleted

## 2022-01-15 VITALS — BP 134/66 | HR 95

## 2022-01-15 DIAGNOSIS — O99213 Obesity complicating pregnancy, third trimester: Secondary | ICD-10-CM | POA: Diagnosis not present

## 2022-01-15 DIAGNOSIS — O10913 Unspecified pre-existing hypertension complicating pregnancy, third trimester: Secondary | ICD-10-CM | POA: Insufficient documentation

## 2022-01-15 DIAGNOSIS — Z3A33 33 weeks gestation of pregnancy: Secondary | ICD-10-CM | POA: Insufficient documentation

## 2022-01-15 DIAGNOSIS — O24113 Pre-existing diabetes mellitus, type 2, in pregnancy, third trimester: Secondary | ICD-10-CM

## 2022-01-15 NOTE — Procedures (Signed)
Kelli Mcpherson 19-Jun-1995 [redacted]w[redacted]d  Fetus A Non-Stress Test Interpretation for 01/15/22  Indication: Chronic Hypertenstion, Diabetes, and obese  Fetal Heart Rate A Mode: External Baseline Rate (A): 150 bpm Variability: Moderate Accelerations: 15 x 15 Decelerations: None Multiple birth?: No  Uterine Activity Mode: Toco Contraction Frequency (min): no u/c's , ui only Resting Tone Palpated: Relaxed Resting Time: Adequate  Interpretation (Fetal Testing) Nonstress Test Interpretation: Reactive Overall Impression: Reassuring for gestational age Comments: tracing reviewed byDr. Grace Bushy

## 2022-01-20 NOTE — L&D Delivery Note (Signed)
Delivery Note At 12:39 PM a viable female was delivered via Vaginal, Spontaneous (Presentation: Right Occiput Anterior).  APGAR: 7, 9; weight 8 lb 12 oz (3969 g).   Placenta status: Spontaneous, Intact.  Cord: 3 vessels with the following complications: None.  Cord pH: not sent  Anesthesia: None Episiotomy: None Lacerations: None Suture Repair:  None Est. Blood Loss (mL): 600cc - one dose of TXA given, resolution of atony.   It's a girl - "[name]"!!    Mom to postpartum.  Baby to Couplet care / Skin to Skin.  Tyson Dense 02/12/2022, 1:35 PM

## 2022-01-21 ENCOUNTER — Other Ambulatory Visit: Payer: Self-pay | Admitting: "Endocrinology

## 2022-01-24 ENCOUNTER — Ambulatory Visit: Payer: Managed Care, Other (non HMO) | Attending: Maternal & Fetal Medicine

## 2022-01-24 ENCOUNTER — Ambulatory Visit: Payer: Managed Care, Other (non HMO) | Admitting: *Deleted

## 2022-01-24 VITALS — BP 139/69 | HR 97

## 2022-01-24 DIAGNOSIS — O10913 Unspecified pre-existing hypertension complicating pregnancy, third trimester: Secondary | ICD-10-CM | POA: Insufficient documentation

## 2022-01-24 DIAGNOSIS — O99213 Obesity complicating pregnancy, third trimester: Secondary | ICD-10-CM | POA: Diagnosis present

## 2022-01-24 DIAGNOSIS — O09293 Supervision of pregnancy with other poor reproductive or obstetric history, third trimester: Secondary | ICD-10-CM | POA: Diagnosis not present

## 2022-01-24 DIAGNOSIS — O24113 Pre-existing diabetes mellitus, type 2, in pregnancy, third trimester: Secondary | ICD-10-CM | POA: Diagnosis not present

## 2022-01-24 DIAGNOSIS — O10013 Pre-existing essential hypertension complicating pregnancy, third trimester: Secondary | ICD-10-CM

## 2022-01-24 DIAGNOSIS — O98513 Other viral diseases complicating pregnancy, third trimester: Secondary | ICD-10-CM

## 2022-01-24 DIAGNOSIS — E119 Type 2 diabetes mellitus without complications: Secondary | ICD-10-CM

## 2022-01-24 DIAGNOSIS — O3663X0 Maternal care for excessive fetal growth, third trimester, not applicable or unspecified: Secondary | ICD-10-CM | POA: Diagnosis not present

## 2022-01-24 DIAGNOSIS — Z794 Long term (current) use of insulin: Secondary | ICD-10-CM

## 2022-01-24 DIAGNOSIS — B009 Herpesviral infection, unspecified: Secondary | ICD-10-CM

## 2022-01-24 DIAGNOSIS — Z3A34 34 weeks gestation of pregnancy: Secondary | ICD-10-CM

## 2022-01-24 DIAGNOSIS — O09213 Supervision of pregnancy with history of pre-term labor, third trimester: Secondary | ICD-10-CM

## 2022-01-24 DIAGNOSIS — E669 Obesity, unspecified: Secondary | ICD-10-CM

## 2022-01-31 ENCOUNTER — Other Ambulatory Visit: Payer: Self-pay | Admitting: *Deleted

## 2022-01-31 ENCOUNTER — Ambulatory Visit: Payer: Managed Care, Other (non HMO) | Attending: Obstetrics and Gynecology

## 2022-01-31 ENCOUNTER — Ambulatory Visit: Payer: Managed Care, Other (non HMO) | Admitting: *Deleted

## 2022-01-31 VITALS — BP 145/73 | HR 98

## 2022-01-31 DIAGNOSIS — O10913 Unspecified pre-existing hypertension complicating pregnancy, third trimester: Secondary | ICD-10-CM | POA: Diagnosis not present

## 2022-01-31 DIAGNOSIS — E669 Obesity, unspecified: Secondary | ICD-10-CM

## 2022-01-31 DIAGNOSIS — Z794 Long term (current) use of insulin: Secondary | ICD-10-CM

## 2022-01-31 DIAGNOSIS — O24113 Pre-existing diabetes mellitus, type 2, in pregnancy, third trimester: Secondary | ICD-10-CM

## 2022-01-31 DIAGNOSIS — O10013 Pre-existing essential hypertension complicating pregnancy, third trimester: Secondary | ICD-10-CM

## 2022-01-31 DIAGNOSIS — B009 Herpesviral infection, unspecified: Secondary | ICD-10-CM

## 2022-01-31 DIAGNOSIS — E119 Type 2 diabetes mellitus without complications: Secondary | ICD-10-CM

## 2022-01-31 DIAGNOSIS — O99213 Obesity complicating pregnancy, third trimester: Secondary | ICD-10-CM | POA: Insufficient documentation

## 2022-01-31 DIAGNOSIS — O09213 Supervision of pregnancy with history of pre-term labor, third trimester: Secondary | ICD-10-CM

## 2022-01-31 DIAGNOSIS — O09293 Supervision of pregnancy with other poor reproductive or obstetric history, third trimester: Secondary | ICD-10-CM

## 2022-01-31 DIAGNOSIS — O98513 Other viral diseases complicating pregnancy, third trimester: Secondary | ICD-10-CM

## 2022-01-31 DIAGNOSIS — Z3A35 35 weeks gestation of pregnancy: Secondary | ICD-10-CM

## 2022-02-04 ENCOUNTER — Encounter (HOSPITAL_COMMUNITY): Payer: Self-pay

## 2022-02-04 ENCOUNTER — Telehealth (HOSPITAL_COMMUNITY): Payer: Self-pay | Admitting: *Deleted

## 2022-02-04 NOTE — Telephone Encounter (Signed)
Preadmission screen  

## 2022-02-05 ENCOUNTER — Encounter (HOSPITAL_COMMUNITY): Payer: Self-pay | Admitting: *Deleted

## 2022-02-05 ENCOUNTER — Telehealth (HOSPITAL_COMMUNITY): Payer: Self-pay | Admitting: *Deleted

## 2022-02-05 LAB — OB RESULTS CONSOLE GBS: GBS: NEGATIVE

## 2022-02-05 NOTE — Telephone Encounter (Signed)
Preadmission screen  

## 2022-02-07 ENCOUNTER — Ambulatory Visit: Payer: Managed Care, Other (non HMO) | Attending: Maternal & Fetal Medicine

## 2022-02-07 ENCOUNTER — Ambulatory Visit: Payer: Managed Care, Other (non HMO) | Admitting: *Deleted

## 2022-02-07 VITALS — BP 136/70 | HR 95

## 2022-02-07 DIAGNOSIS — O10913 Unspecified pre-existing hypertension complicating pregnancy, third trimester: Secondary | ICD-10-CM

## 2022-02-07 DIAGNOSIS — O09213 Supervision of pregnancy with history of pre-term labor, third trimester: Secondary | ICD-10-CM

## 2022-02-07 DIAGNOSIS — O98513 Other viral diseases complicating pregnancy, third trimester: Secondary | ICD-10-CM

## 2022-02-07 DIAGNOSIS — O24113 Pre-existing diabetes mellitus, type 2, in pregnancy, third trimester: Secondary | ICD-10-CM | POA: Insufficient documentation

## 2022-02-07 DIAGNOSIS — B009 Herpesviral infection, unspecified: Secondary | ICD-10-CM

## 2022-02-07 DIAGNOSIS — E119 Type 2 diabetes mellitus without complications: Secondary | ICD-10-CM | POA: Diagnosis not present

## 2022-02-07 DIAGNOSIS — O99213 Obesity complicating pregnancy, third trimester: Secondary | ICD-10-CM | POA: Diagnosis present

## 2022-02-07 DIAGNOSIS — Z3A36 36 weeks gestation of pregnancy: Secondary | ICD-10-CM

## 2022-02-07 DIAGNOSIS — E669 Obesity, unspecified: Secondary | ICD-10-CM

## 2022-02-07 DIAGNOSIS — Z794 Long term (current) use of insulin: Secondary | ICD-10-CM

## 2022-02-07 DIAGNOSIS — O09293 Supervision of pregnancy with other poor reproductive or obstetric history, third trimester: Secondary | ICD-10-CM

## 2022-02-07 DIAGNOSIS — O10013 Pre-existing essential hypertension complicating pregnancy, third trimester: Secondary | ICD-10-CM

## 2022-02-10 NOTE — H&P (Signed)
Preadmit History and Physical - IOL  Kelli Mcpherson is a 27 y.o. G2P0101 at [redacted]w[redacted]d presenting for IOL per MFM due to EFW >99%ile, T2DM on insulin, chronic hypertension on meds with hx of severe preE requiring preterm delivery. Pregnancy additional complicated by HSV on suppression and maternal obesity.  OB History     Gravida  2   Para  1   Term      Preterm  1   AB      Living  1      SAB      IAB      Ectopic      Multiple  0   Live Births  1          Past Medical History:  Diagnosis Date   Chlamydia    COVID-19    DKA (diabetic ketoacidosis) ([place]) 05/29/2019   Eczema    Genital herpes    Gonorrhea    Pre-eclampsia 06/08/2019   Preeclampsia    Type 2 diabetes mellitus (HCC)    UTI (urinary tract infection)    Vaginal Pap smear, abnormal    Past Surgical History:  Procedure Laterality Date   WISDOM TOOTH EXTRACTION     Family History: family history includes Diabetes in her mother; Hypertension in her father and mother. Social History:  reports that she has never smoked. She has never used smokeless tobacco. She reports that she does not currently use alcohol. She reports that she does not use drugs.     Maternal Diabetes: Yes:  Diabetes Type:  Pre-pregnancy Genetic Screening: Normal Maternal Ultrasounds/Referrals: Normal Fetal Ultrasounds or other Referrals:  Referred to Materal Fetal Medicine  Maternal Substance Abuse:  No Significant Maternal Medications:  None Significant Maternal Lab Results:  Group B Strep negative Number of Prenatal Visits:greater than 3 verified prenatal visits Other Comments:  None  Vitals to be taken on admission. Previous visit vitals reviewed in [facility]. Last menstrual period 05/28/2021.  Vitals and nursing note reviewed. Exam conducted with a chaperone present.  Constitutional:      Appearance: Normal appearance.  HENT:     Head: Normocephalic.  Eyes:     Pupils: Pupils are equal, round, and reactive to  light.  Cardiovascular:     Rate and Rhythm: Normal rate and regular rhythm.     Pulses: Normal pulses.  Abdominal:     General: Abdomen is Gravid, nontender Neurological:     Mental Status: She is alert.   Prenatal labs: ABO, Rh: B/Positive/-- (07/06 0000) Antibody: Negative (07/06 0000) Rubella: Immune (07/06 0000) RPR: NON REACTIVE (08/16 1738)  HBsAg: NON REACTIVE (08/16 1738)  HIV: Non Reactive (08/16 1738)  GBS:   neg  Assessment/Plan: Kelli Mcpherson is a 27 y.o. G2P0101 at [redacted]w[redacted]d presenting for IOL per MFM due to EFW >99%ile, T2DM on insulin, chronic hypertension on meds with hx of severe preE requiring preterm delivery.  IOL - Cervix unfavorable. Plan for cytotec, then pitocin/AROM when possible. GBS negative.  cHTN with hx severe preE - continue labetalol 200mg  BID. CBC, CMP pending. Close monitoring for preE symptoms.  T2DM - on 40u levemir qHS at home and humalog 3u TID with meals. Continued on 20u levemir while laboring. Diabetes consult placed. Last EFW 99%ile. Discussed risks of SD given concern for LGA.  HSV - has been on ppx, no symptoms  Charlotta Newton 02/10/2022, 11:31 AM

## 2022-02-11 ENCOUNTER — Other Ambulatory Visit: Payer: Self-pay

## 2022-02-11 ENCOUNTER — Encounter (HOSPITAL_COMMUNITY): Payer: Self-pay | Admitting: Obstetrics and Gynecology

## 2022-02-11 ENCOUNTER — Inpatient Hospital Stay (HOSPITAL_COMMUNITY)
Admission: RE | Admit: 2022-02-11 | Discharge: 2022-02-14 | DRG: 805 | Disposition: A | Discharge status: Home / Self Care | Payer: Managed Care, Other (non HMO) | Attending: Obstetrics and Gynecology | Admitting: Obstetrics and Gynecology

## 2022-02-11 ENCOUNTER — Inpatient Hospital Stay (HOSPITAL_COMMUNITY): Payer: Managed Care, Other (non HMO)

## 2022-02-11 DIAGNOSIS — O134 Gestational [pregnancy-induced] hypertension without significant proteinuria, complicating childbirth: Secondary | ICD-10-CM | POA: Diagnosis present

## 2022-02-11 DIAGNOSIS — O139 Gestational [pregnancy-induced] hypertension without significant proteinuria, unspecified trimester: Principal | ICD-10-CM | POA: Diagnosis present

## 2022-02-11 DIAGNOSIS — Z23 Encounter for immunization: Secondary | ICD-10-CM

## 2022-02-11 DIAGNOSIS — D62 Acute posthemorrhagic anemia: Secondary | ICD-10-CM | POA: Diagnosis not present

## 2022-02-11 DIAGNOSIS — O9832 Other infections with a predominantly sexual mode of transmission complicating childbirth: Secondary | ICD-10-CM | POA: Diagnosis present

## 2022-02-11 DIAGNOSIS — O99214 Obesity complicating childbirth: Secondary | ICD-10-CM | POA: Diagnosis present

## 2022-02-11 DIAGNOSIS — O1002 Pre-existing essential hypertension complicating childbirth: Secondary | ICD-10-CM | POA: Diagnosis present

## 2022-02-11 DIAGNOSIS — A6000 Herpesviral infection of urogenital system, unspecified: Secondary | ICD-10-CM | POA: Diagnosis present

## 2022-02-11 DIAGNOSIS — O2412 Pre-existing diabetes mellitus, type 2, in childbirth: Secondary | ICD-10-CM | POA: Diagnosis present

## 2022-02-11 DIAGNOSIS — Z3A37 37 weeks gestation of pregnancy: Secondary | ICD-10-CM | POA: Diagnosis not present

## 2022-02-11 DIAGNOSIS — O9902 Anemia complicating childbirth: Secondary | ICD-10-CM | POA: Diagnosis present

## 2022-02-11 DIAGNOSIS — Z794 Long term (current) use of insulin: Secondary | ICD-10-CM | POA: Diagnosis not present

## 2022-02-11 LAB — CBC
HCT: 33.1 % — ABNORMAL LOW (ref 36.0–46.0)
Hemoglobin: 10.7 g/dL — ABNORMAL LOW (ref 12.0–15.0)
MCH: 27.6 pg (ref 26.0–34.0)
MCHC: 32.3 g/dL (ref 30.0–36.0)
MCV: 85.3 fL (ref 80.0–100.0)
Platelets: 181 10*3/uL (ref 150–400)
RBC: 3.88 MIL/uL (ref 3.87–5.11)
RDW: 13 % (ref 11.5–15.5)
WBC: 5.7 10*3/uL (ref 4.0–10.5)
nRBC: 0 % (ref 0.0–0.2)

## 2022-02-11 LAB — COMPREHENSIVE METABOLIC PANEL
ALT: 9 U/L (ref 0–44)
AST: 17 U/L (ref 15–41)
Albumin: 2.7 g/dL — ABNORMAL LOW (ref 3.5–5.0)
Alkaline Phosphatase: 106 U/L (ref 38–126)
Anion gap: 9 (ref 5–15)
BUN: 6 mg/dL (ref 6–20)
CO2: 19 mmol/L — ABNORMAL LOW (ref 22–32)
Calcium: 9.3 mg/dL (ref 8.9–10.3)
Chloride: 105 mmol/L (ref 98–111)
Creatinine, Ser: 0.79 mg/dL (ref 0.44–1.00)
GFR, Estimated: >60 mL/min (ref >60)
Glucose, Bld: 217 mg/dL — ABNORMAL HIGH (ref 70–99)
Potassium: 3.5 mmol/L (ref 3.5–5.1)
Sodium: 133 mmol/L — ABNORMAL LOW (ref 135–145)
Total Bilirubin: 0.6 mg/dL (ref 0.3–1.2)
Total Protein: 6.2 g/dL — ABNORMAL LOW (ref 6.5–8.1)

## 2022-02-11 LAB — HIV ANTIBODY (ROUTINE TESTING W REFLEX): HIV Screen 4th Generation wRfx: NONREACTIVE

## 2022-02-11 LAB — TYPE AND SCREEN
ABO/RH(D): B POS
Antibody Screen: NEGATIVE

## 2022-02-11 LAB — GLUCOSE, CAPILLARY: Glucose-Capillary: 149 mg/dL — ABNORMAL HIGH (ref 70–99)

## 2022-02-11 MED ORDER — SOD CITRATE-CITRIC ACID 500-334 MG/5ML PO SOLN
30.0000 mL | ORAL | Status: DC | PRN
  Administered 2022-02-11: 30 mL via ORAL
  Filled 2022-02-11: qty 30

## 2022-02-11 MED ORDER — OXYTOCIN BOLUS FROM INFUSION
333.0000 mL | Freq: Once | INTRAVENOUS | Status: AC
  Administered 2022-02-12: 333 mL via INTRAVENOUS

## 2022-02-11 MED ORDER — LIDOCAINE HCL (PF) 1 % IJ SOLN: 30.0000 mL | INTRAMUSCULAR | Status: DC | PRN

## 2022-02-11 MED ORDER — ACETAMINOPHEN 325 MG PO TABS: 650.0000 mg | ORAL_TABLET | ORAL | Status: DC | PRN

## 2022-02-11 MED ORDER — INSULIN DETEMIR 100 UNIT/ML ~~LOC~~ SOLN
20.0000 [IU] | Freq: Every day | SUBCUTANEOUS | Status: DC
  Administered 2022-02-11: 20 [IU] via SUBCUTANEOUS
  Filled 2022-02-11 (×2): qty 0.2

## 2022-02-11 MED ORDER — LABETALOL HCL 200 MG PO TABS
200.0000 mg | ORAL_TABLET | Freq: Two times a day (BID) | ORAL | Status: DC
  Administered 2022-02-11 – 2022-02-14 (×6): 200 mg via ORAL
  Filled 2022-02-11 (×6): qty 1

## 2022-02-11 MED ORDER — LACTATED RINGERS IV SOLN: INTRAVENOUS | Status: DC

## 2022-02-11 MED ORDER — OXYCODONE-ACETAMINOPHEN 5-325 MG PO TABS: 2.0000 | ORAL_TABLET | ORAL | Status: DC | PRN

## 2022-02-11 MED ORDER — HYDROXYZINE HCL 50 MG PO TABS: 50.0000 mg | ORAL_TABLET | Freq: Four times a day (QID) | ORAL | Status: DC | PRN

## 2022-02-11 MED ORDER — OXYCODONE-ACETAMINOPHEN 5-325 MG PO TABS: 1.0000 | ORAL_TABLET | ORAL | Status: DC | PRN

## 2022-02-11 MED ORDER — LACTATED RINGERS IV SOLN: 500.0000 mL | INTRAVENOUS | Status: DC | PRN

## 2022-02-11 MED ORDER — MISOPROSTOL 25 MCG QUARTER TABLET
25.0000 ug | ORAL_TABLET | ORAL | Status: DC
  Administered 2022-02-11 – 2022-02-12 (×2): 25 ug via BUCCAL
  Filled 2022-02-11 (×2): qty 1

## 2022-02-11 MED ORDER — ONDANSETRON HCL 4 MG/2ML IJ SOLN: 4.0000 mg | Freq: Four times a day (QID) | INTRAMUSCULAR | Status: DC | PRN

## 2022-02-11 MED ORDER — FENTANYL CITRATE (PF) 100 MCG/2ML IJ SOLN
50.0000 ug | INTRAMUSCULAR | Status: DC | PRN
  Administered 2022-02-12: 50 ug via INTRAVENOUS
  Filled 2022-02-11: qty 2

## 2022-02-11 MED ORDER — OXYTOCIN-SODIUM CHLORIDE 30-0.9 UT/500ML-% IV SOLN: 2.5000 [IU]/h | INTRAVENOUS | Status: DC

## 2022-02-12 ENCOUNTER — Encounter (HOSPITAL_COMMUNITY): Payer: Self-pay | Admitting: Obstetrics and Gynecology

## 2022-02-12 ENCOUNTER — Encounter (HOSPITAL_COMMUNITY): Payer: Self-pay | Admitting: Anesthesiology

## 2022-02-12 LAB — CBC
HCT: 34.7 % — ABNORMAL LOW (ref 36.0–46.0)
Hemoglobin: 11 g/dL — ABNORMAL LOW (ref 12.0–15.0)
MCH: 27.2 pg (ref 26.0–34.0)
MCHC: 31.7 g/dL (ref 30.0–36.0)
MCV: 85.9 fL (ref 80.0–100.0)
Platelets: 176 10*3/uL (ref 150–400)
RBC: 4.04 MIL/uL (ref 3.87–5.11)
RDW: 13.1 % (ref 11.5–15.5)
WBC: 7.9 10*3/uL (ref 4.0–10.5)
nRBC: 0 % (ref 0.0–0.2)

## 2022-02-12 LAB — GLUCOSE, CAPILLARY
Glucose-Capillary: 97 mg/dL (ref 70–99)
Glucose-Capillary: 110 mg/dL — ABNORMAL HIGH (ref 70–99)

## 2022-02-12 LAB — RPR: RPR Ser Ql: NONREACTIVE

## 2022-02-12 MED ORDER — PHENYLEPHRINE 80 MCG/ML (10ML) SYRINGE FOR IV PUSH (FOR BLOOD PRESSURE SUPPORT): 80.0000 ug | PREFILLED_SYRINGE | INTRAVENOUS | Status: DC | PRN

## 2022-02-12 MED ORDER — EPHEDRINE 5 MG/ML INJ: 10.0000 mg | INTRAVENOUS | Status: DC | PRN

## 2022-02-12 MED ORDER — COCONUT OIL OIL: 1.0000 | TOPICAL_OIL | Status: DC | PRN

## 2022-02-12 MED ORDER — INSULIN ASPART 100 UNIT/ML IJ SOLN: 0.0000 [IU] | Freq: Three times a day (TID) | INTRAMUSCULAR | Status: DC

## 2022-02-12 MED ORDER — TRANEXAMIC ACID-NACL 1000-0.7 MG/100ML-% IV SOLN: 1000.0000 mg | INTRAVENOUS | Status: AC

## 2022-02-12 MED ORDER — INSULIN ASPART 100 UNIT/ML IJ SOLN
0.0000 [IU] | Freq: Three times a day (TID) | INTRAMUSCULAR | Status: DC
  Administered 2022-02-13 (×2): 1 [IU] via SUBCUTANEOUS

## 2022-02-12 MED ORDER — DIPHENHYDRAMINE HCL 25 MG PO CAPS: 25.0000 mg | ORAL_CAPSULE | Freq: Four times a day (QID) | ORAL | Status: DC | PRN

## 2022-02-12 MED ORDER — ZOLPIDEM TARTRATE 5 MG PO TABS: 5.0000 mg | ORAL_TABLET | Freq: Every evening | ORAL | Status: DC | PRN

## 2022-02-12 MED ORDER — OXYCODONE HCL 5 MG PO TABS
10.0000 mg | ORAL_TABLET | ORAL | Status: DC | PRN
  Filled 2022-02-12: qty 2

## 2022-02-12 MED ORDER — FAMOTIDINE 20 MG PO TABS
20.0000 mg | ORAL_TABLET | Freq: Every day | ORAL | Status: DC
  Administered 2022-02-12 – 2022-02-13 (×2): 20 mg via ORAL
  Filled 2022-02-12 (×3): qty 1

## 2022-02-12 MED ORDER — INFLUENZA VAC SPLIT QUAD 0.5 ML IM SUSY
0.5000 mL | PREFILLED_SYRINGE | INTRAMUSCULAR | Status: AC
  Administered 2022-02-14: 0.5 mL via INTRAMUSCULAR
  Filled 2022-02-12: qty 0.5

## 2022-02-12 MED ORDER — TETANUS-DIPHTH-ACELL PERTUSSIS 5-2.5-18.5 LF-MCG/0.5 IM SUSY
0.5000 mL | PREFILLED_SYRINGE | Freq: Once | INTRAMUSCULAR | Status: AC
  Administered 2022-02-14: 0.5 mL via INTRAMUSCULAR
  Filled 2022-02-12: qty 0.5

## 2022-02-12 MED ORDER — SENNOSIDES-DOCUSATE SODIUM 8.6-50 MG PO TABS
2.0000 | ORAL_TABLET | ORAL | Status: DC
  Administered 2022-02-12 – 2022-02-13 (×2): 2 via ORAL
  Filled 2022-02-12 (×2): qty 2

## 2022-02-12 MED ORDER — SIMETHICONE 80 MG PO CHEW: 80.0000 mg | CHEWABLE_TABLET | ORAL | Status: DC | PRN

## 2022-02-12 MED ORDER — TERBUTALINE SULFATE 1 MG/ML IJ SOLN: 0.2500 mg | Freq: Once | INTRAMUSCULAR | Status: DC | PRN

## 2022-02-12 MED ORDER — INSULIN ASPART 100 UNIT/ML IJ SOLN: 0.0000 [IU] | Freq: Every day | INTRAMUSCULAR | Status: DC

## 2022-02-12 MED ORDER — OXYTOCIN-SODIUM CHLORIDE 30-0.9 UT/500ML-% IV SOLN
1.0000 m[IU]/min | INTRAVENOUS | Status: DC
  Administered 2022-02-12: 2 m[IU]/min via INTRAVENOUS
  Filled 2022-02-12: qty 500

## 2022-02-12 MED ORDER — TRANEXAMIC ACID-NACL 1000-0.7 MG/100ML-% IV SOLN
INTRAVENOUS | Status: AC
  Administered 2022-02-12: 1000 mg via INTRAVENOUS
  Filled 2022-02-12: qty 100

## 2022-02-12 MED ORDER — BENZOCAINE-MENTHOL 20-0.5 % EX AERO
1.0000 | INHALATION_SPRAY | CUTANEOUS | Status: DC | PRN
  Administered 2022-02-13: 1 via TOPICAL
  Filled 2022-02-12 (×3): qty 56

## 2022-02-12 MED ORDER — DIBUCAINE (PERIANAL) 1 % EX OINT: 1.0000 | TOPICAL_OINTMENT | CUTANEOUS | Status: DC | PRN

## 2022-02-12 MED ORDER — ONDANSETRON HCL 4 MG PO TABS: 4.0000 mg | ORAL_TABLET | ORAL | Status: DC | PRN

## 2022-02-12 MED ORDER — PRENATAL MULTIVITAMIN CH
1.0000 | ORAL_TABLET | Freq: Every day | ORAL | Status: DC
  Administered 2022-02-13: 1 via ORAL
  Filled 2022-02-12: qty 1

## 2022-02-12 MED ORDER — FENTANYL-BUPIVACAINE-NACL 0.5-0.125-0.9 MG/250ML-% EP SOLN
12.0000 mL/h | EPIDURAL | Status: DC | PRN
  Filled 2022-02-12: qty 250

## 2022-02-12 MED ORDER — OXYCODONE HCL 5 MG PO TABS
5.0000 mg | ORAL_TABLET | ORAL | Status: DC | PRN
  Administered 2022-02-13 – 2022-02-14 (×4): 5 mg via ORAL
  Filled 2022-02-12 (×4): qty 1

## 2022-02-12 MED ORDER — IBUPROFEN 600 MG PO TABS
600.0000 mg | ORAL_TABLET | Freq: Four times a day (QID) | ORAL | Status: DC
  Administered 2022-02-12 – 2022-02-14 (×6): 600 mg via ORAL
  Filled 2022-02-12 (×7): qty 1

## 2022-02-12 MED ORDER — LACTATED RINGERS IV SOLN: 500.0000 mL | Freq: Once | INTRAVENOUS | Status: DC

## 2022-02-12 MED ORDER — ONDANSETRON HCL 4 MG/2ML IJ SOLN: 4.0000 mg | INTRAMUSCULAR | Status: DC | PRN

## 2022-02-12 MED ORDER — WITCH HAZEL-GLYCERIN EX PADS: 1.0000 | MEDICATED_PAD | CUTANEOUS | Status: DC | PRN

## 2022-02-12 MED ORDER — ACETAMINOPHEN 325 MG PO TABS
650.0000 mg | ORAL_TABLET | ORAL | Status: DC | PRN
  Administered 2022-02-12 – 2022-02-13 (×5): 650 mg via ORAL
  Filled 2022-02-12 (×5): qty 2

## 2022-02-12 MED ORDER — DIPHENHYDRAMINE HCL 50 MG/ML IJ SOLN: 12.5000 mg | INTRAMUSCULAR | Status: DC | PRN

## 2022-02-12 NOTE — Inpatient Diabetes Management (Signed)
Inpatient Diabetes Program Recommendations  AACE/ADA: New Consensus Statement on Inpatient Glycemic Control (2015)  Target Ranges:  Prepandial:   less than 140 mg/dL      Peak postprandial:   less than 180 mg/dL (1-2 hours)      Critically ill patients:  140 - 180 mg/dL   Lab Results  Component Value Date   GLUCAP 97 02/12/2022   HGBA1C 10.1 (H) 07/03/2021    Review of Glycemic Control  Latest Reference Range & Units 02/11/22 22:01 02/12/22 06:11  Glucose-Capillary 70 - 99 mg/dL 149 (H) 97  (H): Data is abnormally high  Diabetes history: GDM Outpatient Diabetes medications:  Levemir 40 units QHS, Humalog SSI Current orders for Inpatient glycemic control:  POCT CBG monitoring Q4H  Spoke with  patient at bedside.  IOL for HTN.  She confirms history of GDM with this baby and her first baby.  She was not on insulin with her first pregnancy.  Discussed hormonal fluctuations postpartum and decrease in insulin resistance.  She administered half of her basal insulin last night.    She came in with a Freestyle Libre and while getting out of the car she knocked it off.  Replaced Freestyle Libre for her.  Placed on back of her right arm.    Asked RN to check  a CBG BID while using the CGM.  Please wrench in CGM flowsheet to allow CGM reading to appear on the DM coordinator flowsheet.    Will continue to follow while inpatient.  Thank you, Reche Dixon, MSN, [facility] Coordinator Inpatient Diabetes Program [phone] (team pager from 8a-5p)

## 2022-02-12 NOTE — Inpatient Diabetes Management (Signed)
Inpatient Diabetes Program Recommendations  AACE/ADA: New Consensus Statement on Inpatient Glycemic Control (2015)  Target Ranges:  Prepandial:   less than 140 mg/dL      Peak postprandial:   less than 180 mg/dL (1-2 hours)      Critically ill patients:  140 - 180 mg/dL   Lab Results  Component Value Date   GLUCAP 97 02/12/2022   HGBA1C 10.1 (H) 07/03/2021    Review of Glycemic Control  Latest Reference Range & Units 02/12/22 06:11  Glucose-Capillary 70 - 99 mg/dL 97    Diabetes history: GDM Outpatient Diabetes medications: Levemir 40 units BID Humalog SSI Current orders for Inpatient glycemic control:  Levemir 20 units HS Novolog 0-15 units TID  Inpatient Diabetes Program Recommendations:    Patient delivered.  Please consider:  discontinue Levemir 20 units QHS Novolog 0-9 units TID   Will continue to follow while inpatient.  Thank you, Reche Dixon, MSN, [facility] Coordinator Inpatient Diabetes Program [phone] (team pager from 8a-5p)

## 2022-02-12 NOTE — Lactation Note (Signed)
This note was copied from a baby's chart. Lactation Consultation Note  Patient Name: Kelli Mcpherson [id number] Date: 02/12/2022 Reason for consult: Initial assessment;Early term 1-38.6wks, Birth Parent with hx CHTN, DM2 on insulin see Birth Parent- MR Age:26 hours Per Data processing manager, infant recently BF for 10 minutes at 1500 pm, LC did not observe latch, Birth Parent feels breastfeeding is going well. Birth Parent will continue to BF infant by cues, on demand, 8+ times within 24 hours, STS.Birth Parent knows to call [facility] if their are any BF questions, concerns or if latch assistance is needed. Birth Parent self expressed few drops of colostrum when discussed hand expression. Birth Parent wants to breastfeed infant , she mostly pumped almost 1 month with 1st chil who was in NICU. LC discussed importance of maternal diet, rest and hydration. [facility] sent [facility] referral. Mom made aware of O/P services, breastfeeding support groups, community resources, and our phone # for post-discharge questions.   Maternal Data Has patient been taught Hand Expression?: Yes Does the patient have breastfeeding experience prior to this delivery?: Yes How long did the patient breastfeed?: Per Birth Parent, 1st child was in NICU BF 1 day but exclussively pumped for almost 1 month  Feeding Mother's Current Feeding Choice: Breast Milk  LATCH Score                    Lactation Tools Discussed/Used    Interventions Interventions: Position options;Skin to skin;Breast feeding basics reviewed;Education;Hand express;LC Services brochure  Discharge Pump:  Kelli Mcpherson Pump referral sent) [facility] Program: Yes  Consult Status Consult Status: Follow-up Date: 02/13/22 Follow-up type: In-patient    Kelli Mcpherson 02/12/2022, 4:09 PM

## 2022-02-12 NOTE — Progress Notes (Signed)
Labor Progress Note  S: Feeling some contractions but declines CLE. Would like to try unmedicated. Denies HA, pih s/s.   O:  Vitals     02/12/2022    9:15 AM 02/12/2022    8:21 AM 02/12/2022    6:00 AM  Vitals with BMI  Systolic [phone]  Diastolic 57 77 71  Pulse 86 85 73   Gen: NAD SVE: 3/60/-2, anterior, AROM copious clear fluid, IUPC placed s/s inability to trace any contractions with body habitus FHT cat 2 for occ variables but good var.   A/P:  Kelli Mcpherson is a 27 y.o. G2P0101 at [redacted]w[redacted]d here for IOL per MFM due to EFW >99%ile, T2DM on insulin, chronic hypertension on meds with hx of severe preE requiring preterm delivery.   IOL - s/p AROM, continue to increase pitocin.  Last EFW 99%ile (7#1 at 34 wga). Discussed risks of SD given concern for LGA. Counseled on risk of shoulder dystocia esp given large AC and those possibilities, need for CS particularly an emergent one.  cHTN with hx severe preE - continue labetalol 200mg  BID. CBC, CMP wnl. Close monitoring for preE symptoms.   T2DM - on 40u levemir qHS at home and humalog 3u TID with meals. Continued on 20u levemir while laboring. Last BS 97. Q 4 hour BS while in labor.  Diabetes consult placed.    HSV - has been on ppx, no symptoms or lesions on exam   Arty Baumgartner MD

## 2022-02-12 NOTE — Plan of Care (Signed)
Problem: Education: Goal: Knowledge of Childbirth will improve Outcome: Progressing Goal: Ability to make informed decisions regarding treatment and plan of care will improve Outcome: Progressing Goal: Ability to state and carry out methods to decrease the pain will improve Outcome: Progressing Goal: Individualized Educational Video(s) Outcome: Progressing   Problem: Coping: Goal: Ability to verbalize concerns and feelings about labor and delivery will improve Outcome: Progressing   Problem: Life Cycle: Goal: Ability to make normal progression through stages of labor will improve Outcome: Progressing Goal: Ability to effectively push during vaginal delivery will improve Outcome: Progressing   Problem: Role Relationship: Goal: Will demonstrate positive interactions with the child Outcome: Progressing   Problem: Safety: Goal: Risk of complications during labor and delivery will decrease Outcome: Progressing   Problem: Pain Management: Goal: Relief or control of pain from uterine contractions will improve Outcome: Progressing   Problem: Education: Goal: Knowledge of General Education information will improve Description: Including pain rating scale, medication(s)/side effects and non-pharmacologic comfort measures Outcome: Progressing   Problem: Health Behavior/Discharge Planning: Goal: Ability to manage health-related needs will improve Outcome: Progressing   Problem: Clinical Measurements: Goal: Ability to maintain clinical measurements within normal limits will improve Outcome: Progressing Goal: Will remain free from infection Outcome: Progressing Goal: Diagnostic test results will improve Outcome: Progressing Goal: Respiratory complications will improve Outcome: Progressing Goal: Cardiovascular complication will be avoided Outcome: Progressing   Problem: Activity: Goal: Risk for activity intolerance will decrease Outcome: Progressing   Problem:  Nutrition: Goal: Adequate nutrition will be maintained Outcome: Progressing   Problem: Coping: Goal: Level of anxiety will decrease Outcome: Progressing   Problem: Elimination: Goal: Will not experience complications related to bowel motility Outcome: Progressing Goal: Will not experience complications related to urinary retention Outcome: Progressing   Problem: Pain Managment: Goal: General experience of comfort will improve Outcome: Progressing   Problem: Safety: Goal: Ability to remain free from injury will improve Outcome: Progressing   Problem: Skin Integrity: Goal: Risk for impaired skin integrity will decrease Outcome: Progressing   Problem: Education: Goal: Ability to describe self-care measures that may prevent or decrease complications (Diabetes Survival Skills Education) will improve Outcome: Progressing Goal: Individualized Educational Video(s) Outcome: Progressing   Problem: Coping: Goal: Ability to adjust to condition or change in health will improve Outcome: Progressing   Problem: Fluid Volume: Goal: Ability to maintain a balanced intake and output will improve Outcome: Progressing   Problem: Health Behavior/Discharge Planning: Goal: Ability to identify and utilize available resources and services will improve Outcome: Progressing Goal: Ability to manage health-related needs will improve Outcome: Progressing   Problem: Metabolic: Goal: Ability to maintain appropriate glucose levels will improve Outcome: Progressing   Problem: Nutritional: Goal: Maintenance of adequate nutrition will improve Outcome: Progressing Goal: Progress toward achieving an optimal weight will improve Outcome: Progressing   Problem: Skin Integrity: Goal: Risk for impaired skin integrity will decrease Outcome: Progressing   Problem: Tissue Perfusion: Goal: Adequacy of tissue perfusion will improve Outcome: Progressing   Problem: Education: Goal: Knowledge of  condition will improve Outcome: Progressing Goal: Individualized Educational Video(s) Outcome: Progressing Goal: Individualized Newborn Educational Video(s) Outcome: Progressing   Problem: Activity: Goal: Will verbalize the importance of balancing activity with adequate rest periods Outcome: Progressing Goal: Ability to tolerate increased activity will improve Outcome: Progressing   Problem: Coping: Goal: Ability to identify and utilize available resources and services will improve Outcome: Progressing   Problem: Life Cycle: Goal: Chance of risk for complications during the postpartum period will decrease Outcome: Progressing  Problem: Role Relationship: Goal: Ability to demonstrate positive interaction with newborn will improve Outcome: Progressing   Problem: Skin Integrity: Goal: Demonstration of wound healing without infection will improve Outcome: Progressing

## 2022-02-12 NOTE — Progress Notes (Signed)
Labor Progress Note  S: Feeling some contractions  O:  Vitals     02/12/2022    6:00 AM 02/12/2022    5:00 AM 02/12/2022    4:00 AM  Vitals with BMI  Systolic [phone]  Diastolic 71 80 74  Pulse 73 78 78   Gen: NAD SVE: 2/50/-3, posterior FHT: 140, mod var, +accels, no decels Toco: difficult to trace due to body habitus  A/P:  Kelli Mcpherson is a 27 y.o. G2P0101 at [redacted]w[redacted]d here for IOL per MFM due to EFW >99%ile, T2DM on insulin, chronic hypertension on meds with hx of severe preE requiring preterm delivery.   IOL - Cervix as above, continue to increase pitocin. AROM when possible. She is not planning epidural. GBS negative.   cHTN with hx severe preE - continue labetalol 200mg  BID. CBC, CMP wnl. Close monitoring for preE symptoms.   T2DM - on 40u levemir qHS at home and humalog 3u TID with meals. Continued on 20u levemir while laboring. Diabetes consult placed. Last EFW 99%ile. Discussed risks of SD given concern for LGA.   HSV - has been on ppx, no symptoms  Hurshel Party, MD

## 2022-02-12 NOTE — Anesthesia Preprocedure Evaluation (Signed)
Anesthesia Evaluation  Patient identified by MRN, date of birth, ID band Patient awake    Reviewed: Allergy & Precautions, H&P , NPO status , Patient's Chart, lab work & pertinent test results  History of Anesthesia Complications Negative for: history of anesthetic complications  Airway Mallampati: II  TM Distance: >3 FB Neck ROM: full    Dental no notable dental hx. (+) Teeth Intact   Pulmonary neg pulmonary ROS   Pulmonary exam normal breath sounds clear to auscultation       Cardiovascular hypertension, Pt. on home beta blockers and Pt. on medications Normal cardiovascular exam Rhythm:regular Rate:Normal     Neuro/Psych negative neurological ROS  negative psych ROS   GI/Hepatic negative GI ROS, Neg liver ROS,,,  Endo/Other  diabetes, Insulin Dependent  Morbid obesity  Renal/GU negative Renal ROS  negative genitourinary   Musculoskeletal   Abdominal  (+) + obese  Peds  Hematology  (+) Blood dyscrasia, anemia   Anesthesia Other Findings   Reproductive/Obstetrics (+) Pregnancy                             Anesthesia Physical Anesthesia Plan  ASA: 3  Anesthesia Plan: Epidural   Post-op Pain Management:    Induction:   PONV Risk Score and Plan:   Airway Management Planned:   Additional Equipment:   Intra-op Plan:   Post-operative Plan:   Informed Consent: I have reviewed the patients History and Physical, chart, labs and discussed the procedure including the risks, benefits and alternatives for the proposed anesthesia with the patient or authorized representative who has indicated his/her understanding and acceptance.       Plan Discussed with:   Anesthesia Plan Comments:        Anesthesia Quick Evaluation

## 2022-02-13 LAB — CBC
HCT: 29 % — ABNORMAL LOW (ref 36.0–46.0)
Hemoglobin: 9.4 g/dL — ABNORMAL LOW (ref 12.0–15.0)
MCH: 27.2 pg (ref 26.0–34.0)
MCHC: 32.4 g/dL (ref 30.0–36.0)
MCV: 84.1 fL (ref 80.0–100.0)
Platelets: 170 10*3/uL (ref 150–400)
RBC: 3.45 MIL/uL — ABNORMAL LOW (ref 3.87–5.11)
RDW: 13.2 % (ref 11.5–15.5)
WBC: 8.8 10*3/uL (ref 4.0–10.5)
nRBC: 0 % (ref 0.0–0.2)

## 2022-02-13 LAB — GLUCOSE, CAPILLARY
Glucose-Capillary: 155 mg/dL — ABNORMAL HIGH (ref 70–99)
Glucose-Capillary: 123 mg/dL — ABNORMAL HIGH (ref 70–99)
Glucose-Capillary: 94 mg/dL (ref 70–99)

## 2022-02-13 NOTE — Lactation Note (Signed)
This note was copied from a baby's chart. Lactation Consultation Note  Patient Name: Girl Norvella Loscalzo [facility] Date: 02/13/2022 Reason for consult: Follow-up assessment;Early term 37-38.6wks;NICU baby;Maternal endocrine disorder Insulin dependent T2DM, CHTN on labetolol Age:26 hours  LC set up DEBP and assisted with first pumping using 24 mm flanges.   Teaching on the importance of frequent double pumping and hand expressing of colostrum.  Encouraged STS with baby as much as possible. Encouraged asking for help with latching to the breast if desires to direct breastfeed. . Lactation Tools Discussed/Used Tools: Pump;Flanges;Bottle Flange Size: 24 Breast pump type: Double-Electric Breast Pump Pump Education: Setup, frequency, and cleaning;Milk Storage Reason for Pumping: Support milk supply/infant transferred to NICU Pumping frequency: Encouraged to pump every 2-3 hrs when awake  Interventions Interventions: Breast feeding basics reviewed;Support pillows;DEBP  Discharge Pump: Stork Pump (Received her Spectra 2)  Consult Status Consult Status: NICU follow-up Date: 02/13/22 Follow-up type: In-patient    Broadus John 02/13/2022, 1:38 PM

## 2022-02-13 NOTE — Progress Notes (Signed)
Postpartum Progress Note  Post Partum Day 1 s/p spontaneous vaginal delivery.  Patient reports well-controlled pain, ambulating without difficulty, voiding spontaneously, tolerating PO.  Vaginal bleeding is appropriate.   Objective: Blood pressure 130/76, pulse 81, temperature 98.5 F (36.9 C), temperature source Oral, resp. rate 18, height 6' (1.829 m), weight (!) 144.2 kg, last menstrual period 05/28/2021, SpO2 100 %, unknown if currently breastfeeding.  Physical Exam:  General: alert and no distress Lochia: appropriate Uterine Fundus: firm DVT Evaluation: No evidence of DVT seen on physical exam.  Recent Labs    02/12/22 1207 02/13/22 0211  HGB 11.0* 9.4*  HCT 34.7* 29.0*    Assessment/Plan: Postpartum Day 1, s/p vaginal delivery. cHTN - mild range and normotensive since delivery. Continue labetalol 200 mg BID T2DM on insulin - appreciate DM coordinator recommendations Acute blood loss anemia - Fe/Colace. No signs or symptoms of anemia.  Lactation following Will troubleshoot pain control and ambulation today Baby girl in NICU for glycemic control   LOS: 2 days   Carlyon Shadow 02/13/2022, 7:23 AM

## 2022-02-14 ENCOUNTER — Ambulatory Visit: Payer: Managed Care, Other (non HMO)

## 2022-02-14 LAB — GLUCOSE, CAPILLARY: Glucose-Capillary: 144 mg/dL — ABNORMAL HIGH (ref 70–99)

## 2022-02-14 MED ORDER — LABETALOL HCL 200 MG PO TABS: 200.0000 mg | ORAL_TABLET | Freq: Two times a day (BID) | ORAL | 3 refills | Status: AC

## 2022-02-14 NOTE — Discharge Summary (Signed)
Postpartum Discharge Summary  Date of Service February 14, 2022     Patient Name: Kelli Mcpherson DOB: [birth year] MRN: [id number]  Date of admission: 02/11/2022 Delivery date:02/12/2022  Delivering provider: Tyson Dense  Date of discharge: 02/14/2022  Admitting diagnosis: Pregnancy induced hypertension [O13.9] Intrauterine pregnancy: [redacted]w[redacted]d     Secondary diagnosis:  Principal Problem:   Pregnancy induced hypertension  Additional problems: none    Discharge diagnosis: CHTN and Type 2 DM                                              Post partum procedures: none Augmentation: AROM, Pitocin, and Cytotec Complications: None  Hospital course: Induction of Labor With Vaginal Delivery   27 y.o. yo [id number] at [redacted]w[redacted]d was admitted to the hospital 02/11/2022 for induction of labor.  Indication for induction: TYPE 2 DM.  Patient had an labor course complicated bynothing Membrane Rupture Time/Date: 8:36 AM ,02/12/2022   Delivery Method:Vaginal, Spontaneous  Episiotomy: None  Lacerations:  None  Details of delivery can be found in separate delivery note.  Patient had a postpartum course complicated bynothing. Patient is discharged home 02/14/22.  Newborn Data: Birth date:02/12/2022  Birth time:12:39 PM  Gender:Female  Living status:Living  Apgars:7 ,9  Weight:3969 g   Magnesium Sulfate received: No BMZ received: No Rhophylac:N/A MMR:N/A T-DaP:Given prenatally Flu: N/A Transfusion:No  Physical exam  Vitals:   02/13/22 0420 02/13/22 2209 02/14/22 0500 02/14/22 0805  BP: 130/76 120/65 (!) 129/58 132/81  Pulse: 81 71 76   Resp: 18 16 16    Temp: 98.5 F (36.9 C) 98.5 F (36.9 C) 97.9 F (36.6 C)   TempSrc: Oral Oral Oral   SpO2: 100% 100% 99%   Weight:      Height:       General: alert, cooperative, and no distress Lochia: appropriate Uterine Fundus: firm Incision: Healing well with no significant drainage DVT Evaluation: No evidence of DVT seen on physical  exam. Labs: Lab Results  Component Value Date   WBC 8.8 02/13/2022   HGB 9.4 (L) 02/13/2022   HCT 29.0 (L) 02/13/2022   MCV 84.1 02/13/2022   PLT 170 02/13/2022      Latest Ref Rng & Units 02/11/2022    7:37 PM  CMP  Glucose 70 - 99 mg/dL 217   BUN 6 - 20 mg/dL 6   Creatinine 0.44 - 1.00 mg/dL 0.79   Sodium 135 - 145 mmol/L 133   Potassium 3.5 - 5.1 mmol/L 3.5   Chloride 98 - 111 mmol/L 105   CO2 22 - 32 mmol/L 19   Calcium 8.9 - 10.3 mg/dL 9.3   Total Protein 6.5 - 8.1 g/dL 6.2   Total Bilirubin 0.3 - 1.2 mg/dL 0.6   Alkaline Phos 38 - 126 U/L 106   AST 15 - 41 U/L 17   ALT 0 - 44 U/L 9    Edinburgh Score:    02/13/2022   12:00 PM  Edinburgh Postnatal Depression Scale Screening Tool  I have been able to laugh and see the funny side of things. 0  I have looked forward with enjoyment to things. 0  I have blamed myself unnecessarily when things went wrong. 0  I have been anxious or worried for no good reason. 0  I have felt scared or panicky for no good reason. 0  Things have been getting on top of me. 0  I have been so unhappy that I have had difficulty sleeping. 0  I have felt sad or miserable. 0  I have been so unhappy that I have been crying. 0  The thought of harming myself has occurred to me. 0  Edinburgh Postnatal Depression Scale Total 0      After visit meds:  Allergies as of 02/14/2022   No Known Allergies      Medication List     STOP taking these medications    insulin lispro 100 UNIT/ML KwikPen Commonly known as: HumaLOG KwikPen   Levemir FlexTouch 100 UNIT/ML FlexPen Generic drug: insulin detemir       TAKE these medications    aspirin EC 81 MG tablet Take 81 mg by mouth 2 (two) times daily. Swallow whole.   [facility] Prenatal Gummy 0.4 MG Chew Chew 1 Units by mouth daily.   Dexcom G7 Receiver Devi Use to monitor BG continuously   Dexcom G7 Sensor Misc CHANGE SENSOR EVERY 10 DAYS   Insulin Syringe-Needle U-100 29G X 1/2" 0.3 ML  Misc Use 4 times per day to administer insulin   labetalol 100 MG tablet Commonly known as: NORMODYNE Take 1 tablet (100 mg total) by mouth 2 (two) times daily. What changed: how much to take   labetalol 200 MG tablet Commonly known as: NORMODYNE Take 1 tablet (200 mg total) by mouth 2 (two) times daily. What changed: You were already taking a medication with the same name, and this prescription was added. Make sure you understand how and when to take each.   valACYclovir 500 MG tablet Commonly known as: Valtrex Take 1 tablet (500 mg total) by mouth 2 (two) times daily. Take for 3 days with onset of outbreak         Discharge home in stable condition Infant Feeding: Breast Infant Disposition:home with mother Discharge instruction: per After Visit Summary and Postpartum booklet. Activity: Advance as tolerated. Pelvic rest for 6 weeks.  Diet: routine diet Anticipated Birth Control: Unsure Postpartum Appointment:1 week Additional Postpartum F/U: BP check 1 week Future Appointments:No future appointments. Follow up Visit:      02/14/2022 Cyril Mourning, MD

## 2022-02-14 NOTE — Progress Notes (Signed)
Patient screened out for psychosocial assessment since none of the following apply: Psychosocial stressors documented in mother or baby's chart Gestation less than 32 weeks Code at delivery  Infant with anomalies  Please contact the Clinical Social Worker if specific needs arise or by MOB's request. MOB's Edinburgh score is 0.  Lucky Alverson Boyd-Gilyard, MSW, LCSW Clinical Social Work [phone]   

## 2022-02-15 ENCOUNTER — Inpatient Hospital Stay (HOSPITAL_COMMUNITY)
Admission: AD | Admit: 2022-02-15 | Discharge: 2022-02-15 | Disposition: A | Discharge status: Home / Self Care | Payer: Managed Care, Other (non HMO) | Attending: Obstetrics and Gynecology | Admitting: Obstetrics and Gynecology

## 2022-02-15 ENCOUNTER — Inpatient Hospital Stay (HOSPITAL_COMMUNITY): Payer: Managed Care, Other (non HMO)

## 2022-02-15 DIAGNOSIS — Z8616 Personal history of COVID-19: Secondary | ICD-10-CM | POA: Diagnosis not present

## 2022-02-15 DIAGNOSIS — R102 Pelvic and perineal pain: Secondary | ICD-10-CM | POA: Diagnosis not present

## 2022-02-15 DIAGNOSIS — O9089 Other complications of the puerperium, not elsewhere classified: Secondary | ICD-10-CM | POA: Insufficient documentation

## 2022-02-15 DIAGNOSIS — R52 Pain, unspecified: Secondary | ICD-10-CM | POA: Diagnosis not present

## 2022-02-15 LAB — CBC WITH DIFFERENTIAL/PLATELET
Abs Immature Granulocytes: 0.02 10*3/uL (ref 0.00–0.07)
Basophils Absolute: 0 10*3/uL (ref 0.0–0.1)
Basophils Relative: 0 %
Eosinophils Absolute: 0.2 10*3/uL (ref 0.0–0.5)
Eosinophils Relative: 3 %
HCT: 28.3 % — ABNORMAL LOW (ref 36.0–46.0)
Hemoglobin: 9.1 g/dL — ABNORMAL LOW (ref 12.0–15.0)
Immature Granulocytes: 0 %
Lymphocytes Relative: 30 %
Lymphs Abs: 2 10*3/uL (ref 0.7–4.0)
MCH: 27.7 pg (ref 26.0–34.0)
MCHC: 32.2 g/dL (ref 30.0–36.0)
MCV: 86 fL (ref 80.0–100.0)
Monocytes Absolute: 0.5 10*3/uL (ref 0.1–1.0)
Monocytes Relative: 8 %
Neutro Abs: 4 10*3/uL (ref 1.7–7.7)
Neutrophils Relative %: 59 %
Platelets: 157 10*3/uL (ref 150–400)
RBC: 3.29 MIL/uL — ABNORMAL LOW (ref 3.87–5.11)
RDW: 13.4 % (ref 11.5–15.5)
WBC: 6.9 10*3/uL (ref 4.0–10.5)
nRBC: 0 % (ref 0.0–0.2)

## 2022-02-15 MED ORDER — TRAMADOL HCL 50 MG PO TABS: 50.0000 mg | ORAL_TABLET | Freq: Four times a day (QID) | ORAL | 0 refills | Status: DC | PRN

## 2022-02-15 MED ORDER — HYDROMORPHONE HCL 1 MG/ML IJ SOLN
1.0000 mg | Freq: Once | INTRAMUSCULAR | Status: AC
  Administered 2022-02-15: 1 mg via INTRAMUSCULAR
  Filled 2022-02-15: qty 1

## 2022-02-15 NOTE — MAU Provider Note (Addendum)
Chief Complaint: Vaginal Pain   Event Date/Time   First Provider Initiated Contact with Patient 02/15/22 0120      SUBJECTIVE HPI: Kelli Mcpherson is a 27 y.o. [id number] who is postpartum day 3 following vaginal delivery presents to maternity admissions reporting vaginal pain 10/10. The pain started immediately after her delivery but was improved inpatient with oxycodone. She was discharged on 02/14/22 with Tylenol and ibuprofen and initially vaginal pain was 2/10 but has now increased to 10/10. Her baby is in NICU so she has been home, back to NICU and back home today since discharge.  She is in such severe pain that she cannot walk and was brought to her room via wheelchair by RN. She  reports moderate lochia, no other complications.  HPI  Past Medical History:  Diagnosis Date   Chlamydia    COVID-19    DKA (diabetic ketoacidosis) ([place]) 05/29/2019   Eczema    Genital herpes    Gonorrhea    Pre-eclampsia 06/08/2019   Preeclampsia    Type 2 diabetes mellitus (H[facility])    UTI (urinary tract infection)    Vaginal Pap smear, abnormal    Past Surgical History:  Procedure Laterality Date   WISDOM TOOTH EXTRACTION     Social History   Socioeconomic History   Marital status: Single    Spouse name: Not on file   Number of children: Not on file   Years of education: Not on file   Highest education level: Not on file  Occupational History   Not on file  Tobacco Use   Smoking status: Never   Smokeless tobacco: Never   Tobacco comments:    Patient occasionally smokes hookah   Vaping Use   Vaping Use: Former   Substances: Nicotine, Flavoring  Substance and Sexual Activity   Alcohol use: Not Currently    Comment: socially   Drug use: Never   Sexual activity: Yes  Other Topics Concern   Not on file  Social History Narrative   Not on file   Social Determinants of Health   Financial Resource Strain: Not on file  Food Insecurity: No Food Insecurity (02/11/2022)   Hunger Vital  Sign    Worried About Running Out of Food in the Last Year: Never true    Ran Out of Food in the Last Year: Never true  Transportation Needs: No Transportation Needs (02/11/2022)   PRAPARE - Hydrologist (Medical): No    Lack of Transportation (Non-Medical): No  Physical Activity: Not on file  Stress: Not on file  Social Connections: Not on file  Intimate Partner Violence: Not At Risk (02/11/2022)   Humiliation, Afraid, Rape, and Kick questionnaire    Fear of Current or Ex-Partner: No    Emotionally Abused: No    Physically Abused: No    Sexually Abused: No   No current facility-administered medications on file prior to encounter.   Current Outpatient Medications on File Prior to Encounter  Medication Sig Dispense Refill   aspirin EC 81 MG tablet Take 81 mg by mouth 2 (two) times daily. Swallow whole.     labetalol (NORMODYNE) 200 MG tablet Take 1 tablet (200 mg total) by mouth 2 (two) times daily. 60 tablet 3   Prenatal Multivit-Min-FA ([employer]S PRENATAL GUMMY) 0.4 MG CHEW Chew 1 Units by mouth daily. 30 tablet 10   valACYclovir (VALTREX) 500 MG tablet Take 1 tablet (500 mg total) by mouth 2 (two) times daily. Take for  3 days with onset of outbreak 6 tablet 10   Continuous Blood Gluc Receiver (DEXCOM G7 RECEIVER) DEVI Use to monitor BG continuously 1 each 0   Continuous Blood Gluc Sensor (DEXCOM G7 SENSOR) MISC CHANGE SENSOR EVERY 10 DAYS 3 each 2   Insulin Syringe-Needle U-100 29G X 1/2" 0.3 ML MISC Use 4 times per day to administer insulin 200 each 0   labetalol (NORMODYNE) 100 MG tablet Take 1 tablet (100 mg total) by mouth 2 (two) times daily. (Patient taking differently: Take 200 mg by mouth 2 (two) times daily.) 60 tablet 0   [DISCONTINUED] enalapril (VASOTEC) 10 MG tablet Take 1 tablet (10 mg total) by mouth daily. 30 tablet 0   No Known Allergies  ROS:  Review of Systems  Constitutional:  Negative for chills, fatigue and fever.  Eyes:  Negative for  visual disturbance.  Respiratory:  Negative for shortness of breath.   Cardiovascular:  Negative for chest pain.  Gastrointestinal:  Negative for abdominal pain, nausea and vomiting.  Genitourinary:  Positive for vaginal pain. Negative for difficulty urinating, dysuria, flank pain, pelvic pain, vaginal bleeding and vaginal discharge.  Neurological:  Negative for dizziness and headaches.  Psychiatric/Behavioral: Negative.       I have reviewed patient's Past Medical Hx, Surgical Hx, Family Hx, Social Hx, medications and allergies.   Physical Exam  Patient Vitals for the past 24 hrs:  BP Temp Temp src Pulse Resp Height Weight  02/15/22 0102 (!) 147/77 98.4 F (36.9 C) Oral 77 16 6' (1.829 m) (!) 143.3 kg   Constitutional: Well-developed, well-nourished female in no acute distress.  Cardiovascular: normal rate Respiratory: normal effort GI: Abd soft, non-tender. Pos BS x 4 MS: Extremities nontender, no edema, normal ROM Neurologic: Alert and oriented x 4.  GU: Neg CVAT.  PELVIC EXAM: On visual inspection, no lacerations, edema, or erythema of perineum or vaginally at introitus.  SSE with normal vaginal walls on inspection. Cervix with edema, purple in color, and significant pain per pt with speculum making contact with cervix, moderate amount of dark red bleeding requiring 2 fox swabs to visualize cervix.   Digital exam with cervix soft, mild edema, no mass, no firmness or other abnormality palpable, pain again with manipulation of cervix  LAB RESULTS Results for orders placed or performed during the hospital encounter of 02/15/22 (from the past 24 hour(s))  CBC with Differential/Platelet     Status: Abnormal   Collection Time: 02/15/22  2:11 AM  Result Value Ref Range   WBC 6.9 4.0 - 10.5 K/uL   RBC 3.29 (L) 3.87 - 5.11 MIL/uL   Hemoglobin 9.1 (L) 12.0 - 15.0 g/dL   HCT 28.3 (L) 36.0 - 46.0 %   MCV 86.0 80.0 - 100.0 fL   MCH 27.7 26.0 - 34.0 pg   MCHC 32.2 30.0 - 36.0 g/dL    RDW 13.4 11.5 - 15.5 %   Platelets 157 150 - 400 K/uL   nRBC 0.0 0.0 - 0.2 %   Neutrophils Relative % 59 %   Neutro Abs 4.0 1.7 - 7.7 K/uL   Lymphocytes Relative 30 %   Lymphs Abs 2.0 0.7 - 4.0 K/uL   Monocytes Relative 8 %   Monocytes Absolute 0.5 0.1 - 1.0 K/uL   Eosinophils Relative 3 %   Eosinophils Absolute 0.2 0.0 - 0.5 K/uL   Basophils Relative 0 %   Basophils Absolute 0.0 0.0 - 0.1 K/uL   Immature Granulocytes 0 %   Abs  Immature Granulocytes 0.02 0.00 - 0.07 K/uL    --/--/B POS (01/23 1942)  IMAGING  MAU Management/MDM: Orders Placed This Encounter  Procedures   US Pelvis Complete   CBC with Differential/Platelet   Discharge patient    Meds ordered this encounter  Medications   HYDROmorphone (DILAUDID) injection 1 mg   traMADol (ULTRAM) 50 MG tablet    Sig: Take 1 tablet (50 mg total) by mouth every 6 (six) hours as needed.    Dispense:  10 tablet    Refill:  0    Order Specific Question:   Supervising Provider    Answer:   Jaynie Collins A [[id number]]    Pelvic exam wnl for postpartum, with pain to palpation of cervix.  No acute findings, no evidence of infection, laceration, or hematoma.  Pelvic US without abnormalities.  Consult Dr Macon Large with assessment and finding.s  Rx for Tramadol PO, short course. Pt to rest and limit trips back and forth from home to the hospital in the next few days.  F/U in office next week if pain persists. Return to MAU as needed for emergencies.    ASSESSMENT 1. Female pelvic pain   2. Postpartum pain   3. Vaginal pain     PLAN Discharge home Allergies as of 02/15/2022   No Known Allergies      Medication List     TAKE these medications    aspirin EC 81 MG tablet Take 81 mg by mouth 2 (two) times daily. Swallow whole.   CVS Prenatal Gummy 0.4 MG Chew Chew 1 Units by mouth daily.   Dexcom G7 Receiver Devi Use to monitor BG continuously   Dexcom G7 Sensor Misc CHANGE SENSOR EVERY 10 DAYS   Insulin Syringe-Needle  U-100 29G X 1/2" 0.3 ML Misc Use 4 times per day to administer insulin   labetalol 200 MG tablet Commonly known as: NORMODYNE Take 1 tablet (200 mg total) by mouth 2 (two) times daily. What changed: Another medication with the same name was removed. Continue taking this medication, and follow the directions you see here.   traMADol 50 MG tablet Commonly known as: ULTRAM Take 1 tablet (50 mg total) by mouth every 6 (six) hours as needed.   valACYclovir 500 MG tablet Commonly known as: Valtrex Take 1 tablet (500 mg total) by mouth 2 (two) times daily. Take for 3 days with onset of outbreak        Follow-up Information     [place] Follow up.   Why: As scheduled Contact information: [address] [place] [address] [phone]         [facility] Follow up.   Specialty: Obstetrics and Gynecology Why: As needed for emergencies Contact information: [address] [id number] Wilhemina Bonito [place] [address] [phone]                Sharen Counter Certified Nurse-Midwife 02/15/2022  4:29 AM

## 2022-02-15 NOTE — MAU Note (Signed)
Pt say she del Tuesday - vag . Went home today . Felt good after D/c - picked up TYl and Ibuprofen - from Collins when she walks - she feels a sharp pain in her vagina . Took XS  Ty  1 tab at 630pm Took Ibu 600mg  at 2 pm Baby in NICU

## 2022-02-16 ENCOUNTER — Ambulatory Visit: Payer: Self-pay

## 2022-02-16 NOTE — Lactation Note (Signed)
This note was copied from a baby's chart. Lactation Consultation Note  Patient Name: Kelli Mcpherson [id number] Date: 02/16/2022 Reason for consult: Follow-up assessment;NICU baby;Early term 37-38.6wks;Maternal endocrine disorder;Breastfeeding assistance;RN request Age:26 days  Visited with family of 76 hours old ETI NICU female, [name] is a P2 and has some experience pumping with her first baby. She voiced her milk is in, she's been using the expression mode in her pumping, her volumes continue to increase, praised her for her efforts. Noticed she hasn't been pumping consistently, explained the importance of consistent pumping for the prevention of engorgement and to protect her supply; she voiced understanding. NICU RN Tiffany called this LC to assist with latching and positioning; [name] has been taking baby [name] to breast, but she's been mostly getting bottles. Baby fussy and difficult to console, attempted latching in both breasts in football hold but she wasn't able to sustain the latch (see LATCH score). [name] proceeded to bottle feed after this attempt, but baby kept showing the same behaviors with bottle feeding as well, discussed ways to soothe her, RN got mylicon drops for baby. [name] is taking baby home today. Reviewed discharge education, pumping schedule, lactogenesis II/III and anticipatory guidelines. Referral to Waynard Reeds. sent today. No other support person at this time. All questions and concerns answered, family to contact [facility] services PRN.  Feeding Mother's Current Feeding Choice: Breast Milk and Formula Nipple Type: Nfant Slow Flow (purple)  LATCH Score Latch: Repeated attempts needed to sustain latch, nipple held in mouth throughout feeding, stimulation needed to elicit sucking reflex. (Just a few sucks, baby lacked coordination, head bobbing, unable to sustain latch, she kept crying)  Audible Swallowing: None  Type of Nipple: Everted at rest and after  stimulation  Comfort (Breast/Nipple): Soft / non-tender  Hold (Positioning): Assistance needed to correctly position infant at breast and maintain latch.  LATCH Score: 6  Lactation Tools Discussed/Used Tools: Pump;Flanges Flange Size: 24 Breast pump type: Double-Electric Breast Pump Pump Education: Setup, frequency, and cleaning;Milk Storage Reason for Pumping: ETI in NICU Pumping frequency: 4 times/24 hours Pumped volume: 35 mL  Interventions Interventions: Breast feeding basics reviewed;Assisted with latch;Breast massage;Hand express;DEBP;Education  Discharge Discharge Education: Engorgement and breast care;Outpatient recommendation;Outpatient Epic message sent Pump: Stork Pump (Spectra)  Consult Status Consult Status: Complete Date: 02/16/22 Follow-up type: Call as needed   Kelli Mcpherson 02/16/2022, 11:50 AM

## 2022-02-17 ENCOUNTER — Encounter (HOSPITAL_COMMUNITY): Payer: Self-pay | Admitting: Obstetrics and Gynecology

## 2022-02-17 ENCOUNTER — Inpatient Hospital Stay (HOSPITAL_COMMUNITY): Payer: Managed Care, Other (non HMO)

## 2022-02-17 ENCOUNTER — Inpatient Hospital Stay (HOSPITAL_COMMUNITY)
Admission: AD | Admit: 2022-02-17 | Discharge: 2022-02-17 | Disposition: A | Discharge status: Home / Self Care | Payer: Managed Care, Other (non HMO) | Attending: Obstetrics and Gynecology | Admitting: Obstetrics and Gynecology

## 2022-02-17 DIAGNOSIS — I10 Essential (primary) hypertension: Secondary | ICD-10-CM | POA: Diagnosis not present

## 2022-02-17 DIAGNOSIS — K449 Diaphragmatic hernia without obstruction or gangrene: Secondary | ICD-10-CM | POA: Insufficient documentation

## 2022-02-17 DIAGNOSIS — R1030 Lower abdominal pain, unspecified: Secondary | ICD-10-CM

## 2022-02-17 DIAGNOSIS — R102 Pelvic and perineal pain: Secondary | ICD-10-CM | POA: Diagnosis present

## 2022-02-17 DIAGNOSIS — Z79899 Other long term (current) drug therapy: Secondary | ICD-10-CM | POA: Diagnosis not present

## 2022-02-17 DIAGNOSIS — M7918 Myalgia, other site: Secondary | ICD-10-CM | POA: Diagnosis not present

## 2022-02-17 DIAGNOSIS — O99893 Other specified diseases and conditions complicating puerperium: Secondary | ICD-10-CM | POA: Insufficient documentation

## 2022-02-17 DIAGNOSIS — K5900 Constipation, unspecified: Secondary | ICD-10-CM | POA: Diagnosis not present

## 2022-02-17 DIAGNOSIS — R161 Splenomegaly, not elsewhere classified: Secondary | ICD-10-CM | POA: Insufficient documentation

## 2022-02-17 LAB — CBC WITH DIFFERENTIAL/PLATELET
Abs Immature Granulocytes: 0.03 10*3/uL (ref 0.00–0.07)
Basophils Absolute: 0 10*3/uL (ref 0.0–0.1)
Basophils Relative: 0 %
Eosinophils Absolute: 0.2 10*3/uL (ref 0.0–0.5)
Eosinophils Relative: 2 %
HCT: 31.2 % — ABNORMAL LOW (ref 36.0–46.0)
Hemoglobin: 10 g/dL — ABNORMAL LOW (ref 12.0–15.0)
Immature Granulocytes: 0 %
Lymphocytes Relative: 13 %
Lymphs Abs: 1.3 10*3/uL (ref 0.7–4.0)
MCH: 27.9 pg (ref 26.0–34.0)
MCHC: 32.1 g/dL (ref 30.0–36.0)
MCV: 87.2 fL (ref 80.0–100.0)
Monocytes Absolute: 0.5 10*3/uL (ref 0.1–1.0)
Monocytes Relative: 5 %
Neutro Abs: 7.9 10*3/uL — ABNORMAL HIGH (ref 1.7–7.7)
Neutrophils Relative %: 80 %
Platelets: 180 10*3/uL (ref 150–400)
RBC: 3.58 MIL/uL — ABNORMAL LOW (ref 3.87–5.11)
RDW: 13.5 % (ref 11.5–15.5)
WBC: 10 10*3/uL (ref 4.0–10.5)
nRBC: 0 % (ref 0.0–0.2)

## 2022-02-17 LAB — COMPREHENSIVE METABOLIC PANEL
ALT: 15 U/L (ref 0–44)
AST: 20 U/L (ref 15–41)
Albumin: 2.8 g/dL — ABNORMAL LOW (ref 3.5–5.0)
Alkaline Phosphatase: 90 U/L (ref 38–126)
Anion gap: 7 (ref 5–15)
BUN: 6 mg/dL (ref 6–20)
CO2: 25 mmol/L (ref 22–32)
Calcium: 8.6 mg/dL — ABNORMAL LOW (ref 8.9–10.3)
Chloride: 106 mmol/L (ref 98–111)
Creatinine, Ser: 0.72 mg/dL (ref 0.44–1.00)
GFR, Estimated: >60 mL/min (ref >60)
Glucose, Bld: 122 mg/dL — ABNORMAL HIGH (ref 70–99)
Potassium: 4.1 mmol/L (ref 3.5–5.1)
Sodium: 138 mmol/L (ref 135–145)
Total Bilirubin: 0.2 mg/dL — ABNORMAL LOW (ref 0.3–1.2)
Total Protein: 6.4 g/dL — ABNORMAL LOW (ref 6.5–8.1)

## 2022-02-17 MED ORDER — GABAPENTIN 100 MG PO CAPS: 100.0000 mg | ORAL_CAPSULE | Freq: Three times a day (TID) | ORAL | 0 refills | Status: DC

## 2022-02-17 MED ORDER — HYDROMORPHONE HCL 1 MG/ML IJ SOLN
1.0000 mg | INTRAMUSCULAR | Status: DC | PRN
  Administered 2022-02-17: 1 mg via INTRAVENOUS
  Filled 2022-02-17: qty 1

## 2022-02-17 MED ORDER — TRAMADOL HCL 50 MG PO TABS: 50.0000 mg | ORAL_TABLET | Freq: Four times a day (QID) | ORAL | 0 refills | Status: DC | PRN

## 2022-02-17 MED ORDER — DOCUSATE SODIUM 100 MG PO CAPS: 100.0000 mg | ORAL_CAPSULE | Freq: Two times a day (BID) | ORAL | 2 refills | Status: DC | PRN

## 2022-02-17 MED ORDER — HYDROXYZINE HCL 50 MG/ML IM SOLN
25.0000 mg | Freq: Once | INTRAMUSCULAR | Status: AC
  Administered 2022-02-17: 25 mg via INTRAMUSCULAR
  Filled 2022-02-17: qty 0.5

## 2022-02-17 MED ORDER — SIMETHICONE 80 MG PO CHEW
80.0000 mg | CHEWABLE_TABLET | Freq: Once | ORAL | Status: AC
  Administered 2022-02-17: 80 mg via ORAL
  Filled 2022-02-17: qty 1

## 2022-02-17 MED ORDER — POLYETHYLENE GLYCOL 3350 17 G PO PACK: 17.0000 g | PACK | Freq: Every day | ORAL | 0 refills | Status: DC

## 2022-02-17 MED ORDER — SENNOSIDES-DOCUSATE SODIUM 8.6-50 MG PO TABS: 1.0000 | ORAL_TABLET | Freq: Every evening | ORAL | 0 refills | Status: DC | PRN

## 2022-02-17 MED ORDER — IOHEXOL 350 MG/ML SOLN
75.0000 mL | Freq: Once | INTRAVENOUS | Status: AC | PRN
  Administered 2022-02-17: 75 mL via INTRAVENOUS

## 2022-02-17 NOTE — Lactation Note (Signed)
This note was copied from a baby's chart.  NICU Lactation Consultation Note  Patient Name: Kelli Mcpherson JOACZ'Y Date: 02/17/2022 Age:26 days  Earlier today, LC was notified by RN that Mom had requested a nipple shield.  LC went to speak to Mom while she was bottle feeding baby.  Mom agreed to notify her baby's RN she would like LC to assist with feeding.  Mom reports her milk volume is increasing.  LC in to visit with Mom, and GMOB was feeding baby her bottle.  Mom wasn't feeling well and went down to MAU to be evaluated.    Annex set up washing and drying bins for pump parts and placed them on clean paper towel.    Baby was supposed to be discharged    Subjective Reason for consult: Follow-up assessment; NICU baby; Early term 28-38.6wks; Maternal endocrine disorder; Breastfeeding assistance; RN request   Objective Infant data: Mother's Current Feeding Choice: Breast Milk and Formula  Infant feeding assessment Scale for Readiness: 1 Scale for Quality: 2     Maternal data: [id number]  Vaginal, Spontaneous Pumping frequency: 4 times/24 hours Pumped volume: 35 mL Flange Size: 24  Risk factor for low milk supply:: infrequent pumping as on 02/16/2022   WIC Program: Yes Pump: Stork Pump [employer])  Assessment Infant: LATCH Score: 6  Feeding Status: Ad lib   Maternal: Milk volume: Normal   Intervention/Plan Interventions: Breast feeding basics reviewed; Assisted with latch; Breast massage; Hand express; DEBP; Education  Tools: Pump; Flanges Pump Education: Setup, frequency, and cleaning; Milk Storage  Plan: Consult Status: Follow-up  Weekly follow-up   Broadus John 02/17/2022, 2:45 PM

## 2022-02-17 NOTE — MAU Note (Signed)
Kelli Mcpherson is a 27 y.o. at Unknown here in MAU reporting: vag del 1/24.  Induction for PIH and Type 2 DM. Still having that same cervical pain. Having stomach cramps,unsure if related to breast feeding.  Was given pain meds, they just make her sleep and when she wakes up the pain is back.  This is not going to work, because she can't take care of the baby or her 68 yr old.  Able to urinate, has not had a BM  Onset of complaint: was having pain when dc'd from the hospital.  Was fine when in hospitatl Pain score: 10 Vitals:   02/17/22 1119  BP: (!) 154/71  Pulse: 89  Resp: 20  Temp: 100.2 F (37.9 C)  SpO2: 99%      Lab orders placed from triage:  none

## 2022-02-17 NOTE — MAU Provider Note (Signed)
History    CSN: [id number]  Arrival date and time: 02/17/22 1103  Event Date/Time  First Provider Initiated Contact with Patient 02/17/22 1131     Chief Complaint  Patient presents with   Abdominal Pain   HPI Kelli Mcpherson is s 27 y.o. [id number] postpartum patient. She is s/p vaginal delivery over intact perineum on 02/12/2022. Patient presents today with chief complaint of suprapubic abdominal pain, 10/10, "crampy" and causing her to cry on arrival to MAU. Pain improves with previously prescribed Tramadol but quickly returns. Patient endorses generalized lower abdominal pain. She denies rebound tenderness. She denies dysuria, joint pain, fever. Most recent bowel movement was two days ago.  Patient also presents with complaint of ongoing pain "in the vagina and cervix". These complaints started immediately after delivery. She presented to MAU less than 12 hours after hospital discharge for evaluation. Patient states she managed her pain with medication while inpatient. Now she is discharged, spending the majority of the day with her baby in the NICU and then caring for her 26 year old when she can. She has pain medication that makes her fall asleep but the pain returns shortly after she awakens. Pain occurs with walking and is "sharp". Pain "almost completely" resolves at rest. She last took Tramadol at 0200 this morning.  In the course of describing symptoms patient mentions bilateral hip pain, onset around 32 weeks and persisting through the immediate postpartum period. She endorses presence of bilateral hip pain on arrival today and verbalizes that she feels it is being exacerbated by the time she has spent sitting and sleeping in the NICU recliner.  Patient and her mother voice concern that patient's vaginal and cervical pain are related to her unmedicated vaginal delivery and the "help she needed pulling the baby's head out".   Patient's PMH includes Chronic Hypertension managed with  Labetalol 200 mg. She took this medication at 0800.   Patient denies concerns related to her lochia, which she describes as "getting better". She is exclusively breastfeeding and denies concerns related to lactation. She denies breast pain.  Patient receives care with [facility]. Her baby is scheduled to be discharged from NICU tomorrow and she expects to be able to rest and utilize the family support she has in place.   OB History     Gravida  2   Para  2   Term  1   Preterm  1   AB      Living  2      SAB      IAB      Ectopic      Multiple  0   Live Births  2           Past Medical History:  Diagnosis Date   Chlamydia    COVID-19    DKA (diabetic ketoacidosis) ([place]) 05/29/2019   Eczema    Genital herpes    Gonorrhea    Pre-eclampsia 06/08/2019   Preeclampsia    Type 2 diabetes mellitus ([facility])    UTI (urinary tract infection)    Vaginal Pap smear, abnormal     Past Surgical History:  Procedure Laterality Date   WISDOM TOOTH EXTRACTION      Family History  Problem Relation Age of Onset   Diabetes Mother    Hypertension Mother    Hypertension Father    Asthma Neg Hx    Cancer Neg Hx    Birth defects Neg Hx    Heart  disease Neg Hx    Stroke Neg Hx     Social History   Tobacco Use   Smoking status: Never   Smokeless tobacco: Never   Tobacco comments:    Patient occasionally smokes hookah   Vaping Use   Vaping Use: Former   Substances: Nicotine, Flavoring  Substance Use Topics   Alcohol use: Not Currently    Comment: socially   Drug use: Never    Allergies: No Known Allergies  Medications Prior to Admission  Medication Sig Dispense Refill Last Dose   aspirin EC 81 MG tablet Take 81 mg by mouth 2 (two) times daily. Swallow whole.   02/16/2022   labetalol (NORMODYNE) 200 MG tablet Take 1 tablet (200 mg total) by mouth 2 (two) times daily. 60 tablet 3 02/17/2022 at 0800   traMADol (ULTRAM) 50 MG tablet Take 1 tablet (50 mg  total) by mouth every 6 (six) hours as needed. 10 tablet 0 02/17/2022   Continuous Blood Gluc Receiver (DEXCOM G7 RECEIVER) DEVI Use to monitor BG continuously 1 each 0    Continuous Blood Gluc Sensor (DEXCOM G7 SENSOR) MISC CHANGE SENSOR EVERY 10 DAYS 3 each 2    Insulin Syringe-Needle U-100 29G X 1/2" 0.3 ML MISC Use 4 times per day to administer insulin 200 each 0    Prenatal Multivit-Min-FA ([employer] PRENATAL GUMMY) 0.4 MG CHEW Chew 1 Units by mouth daily. 30 tablet 10    valACYclovir (VALTREX) 500 MG tablet Take 1 tablet (500 mg total) by mouth 2 (two) times daily. Take for 3 days with onset of outbreak 6 tablet 10     Review of Systems  Constitutional:  Positive for fatigue.  Gastrointestinal:  Positive for abdominal pain.  Genitourinary:  Positive for vaginal pain.  Psychiatric/Behavioral:  The patient is nervous/anxious.   All other systems reviewed and are negative.  Physical Exam   Blood pressure (!) 151/90, pulse 84, temperature 100.2 F (37.9 C), temperature source Oral, resp. rate 20, height 6' (1.829 m), weight (!) 142.5 kg, SpO2 99 %, currently breastfeeding.  Physical Exam Vitals and nursing note reviewed.  Constitutional:      General: She is in acute distress.     Appearance: Normal appearance. She is obese.  Cardiovascular:     Rate and Rhythm: Normal rate and regular rhythm.     Pulses: Normal pulses.     Heart sounds: Normal heart sounds.  Pulmonary:     Effort: Pulmonary effort is normal.     Breath sounds: Normal breath sounds.  Abdominal:     General: Bowel sounds are normal.     Palpations: Abdomen is soft. There is no mass.     Tenderness: There is abdominal tenderness in the suprapubic area. There is guarding. There is no rebound.  Skin:    Capillary Refill: Capillary refill takes less than 2 seconds.  Neurological:     Mental Status: She is alert and oriented to person, place, and time.  Psychiatric:        Mood and Affect: Mood normal.        Behavior:  Behavior normal.        Thought Content: Thought content normal.        Judgment: Judgment normal.     MAU Course  Procedures  MDM --EMR reviewed: Per delivery note intact perineum, no problems at delivery. S/p grossly normal pelvic exam and pelvic ultrasound during MAU encounter 02/15/2022. Discussed with patient I do not feel an immediate indication  to repeat pelvic exam and per patient report and CNM documentation this was traumatizing for her --Care plan developed with Dr. Adrian Blackwater. Will manage pain and assess for infection via lab work, likely CT --Pain score 0/10 as of 1650. Patient ambulating in hallway, smiling and verbalizing she feels well  Orders Placed This Encounter  Procedures   CT ABDOMEN PELVIS W CONTRAST   CBC with Differential/Platelet   Comprehensive metabolic panel   Discharge patient   Patient Vitals for the past 24 hrs:  BP Temp Temp src Pulse Resp SpO2 Height Weight  02/17/22 1641 (!) 157/66 -- -- (!) 104 14 -- -- --  02/17/22 1537 (!) 141/72 -- -- 87 -- -- -- --  02/17/22 1333 (!) 146/78 -- -- 97 -- -- -- --  02/17/22 1316 (!) 141/69 -- -- 92 -- -- -- --  02/17/22 1231 (!) 142/76 -- -- 91 -- -- -- --  02/17/22 1216 121/73 -- -- 90 -- -- -- --  02/17/22 1201 (!) 149/73 -- -- 86 -- -- -- --  02/17/22 1146 (!) 149/78 -- -- 87 -- -- -- --  02/17/22 1131 (!) 151/90 -- -- 84 -- -- -- --  02/17/22 1119 (!) 154/71 100.2 F (37.9 C) Oral 89 20 99 % 6' (1.829 m) (!) 142.5 kg   Results for orders placed or performed during the hospital encounter of 02/17/22 (from the past 24 hour(s))  CBC with Differential/Platelet     Status: Abnormal   Collection Time: 02/17/22 11:50 AM  Result Value Ref Range   WBC 10.0 4.0 - 10.5 K/uL   RBC 3.58 (L) 3.87 - 5.11 MIL/uL   Hemoglobin 10.0 (L) 12.0 - 15.0 g/dL   HCT 44.0 (L) 10.2 - 72.5 %   MCV 87.2 80.0 - 100.0 fL   MCH 27.9 26.0 - 34.0 pg   MCHC 32.1 30.0 - 36.0 g/dL   RDW 36.6 44.0 - 34.7 %   Platelets 180 150 - 400 K/uL    nRBC 0.0 0.0 - 0.2 %   Neutrophils Relative % 80 %   Neutro Abs 7.9 (H) 1.7 - 7.7 K/uL   Lymphocytes Relative 13 %   Lymphs Abs 1.3 0.7 - 4.0 K/uL   Monocytes Relative 5 %   Monocytes Absolute 0.5 0.1 - 1.0 K/uL   Eosinophils Relative 2 %   Eosinophils Absolute 0.2 0.0 - 0.5 K/uL   Basophils Relative 0 %   Basophils Absolute 0.0 0.0 - 0.1 K/uL   Immature Granulocytes 0 %   Abs Immature Granulocytes 0.03 0.00 - 0.07 K/uL  Comprehensive metabolic panel     Status: Abnormal   Collection Time: 02/17/22 11:50 AM  Result Value Ref Range   Sodium 138 135 - 145 mmol/L   Potassium 4.1 3.5 - 5.1 mmol/L   Chloride 106 98 - 111 mmol/L   CO2 25 22 - 32 mmol/L   Glucose, Bld 122 (H) 70 - 99 mg/dL   BUN 6 6 - 20 mg/dL   Creatinine, Ser 4.25 0.44 - 1.00 mg/dL   Calcium 8.6 (L) 8.9 - 10.3 mg/dL   Total Protein 6.4 (L) 6.5 - 8.1 g/dL   Albumin 2.8 (L) 3.5 - 5.0 g/dL   AST 20 15 - 41 U/L   ALT 15 0 - 44 U/L   Alkaline Phosphatase 90 38 - 126 U/L   Total Bilirubin 0.2 (L) 0.3 - 1.2 mg/dL   GFR, Estimated >95 >63 mL/min   Anion gap 7 5 -  15   CT ABDOMEN PELVIS W CONTRAST  Result Date: 02/17/2022 CLINICAL DATA:  Acute abdominal pain.  Recently postpartum. EXAM: CT ABDOMEN AND PELVIS WITH CONTRAST TECHNIQUE: Multidetector CT imaging of the abdomen and pelvis was performed using the standard protocol following bolus administration of intravenous contrast. RADIATION DOSE REDUCTION: This exam was performed according to the departmental dose-optimization program which includes automated exposure control, adjustment of the mA and/or kV according to patient size and/or use of iterative reconstruction technique. CONTRAST:  36mL OMNIPAQUE IOHEXOL 350 MG/ML SOLN COMPARISON:  Pelvic ultrasound 02/15/2022 FINDINGS: Lower chest: There is breathing motion throughout the lung bases. No pleural effusion. Minimal atelectasis along the left costophrenic angle. Hepatobiliary: Preserved hepatic parenchyma without  space-occupying liver lesion. Gallbladder is nondilated. Pancreas: Preserved pancreatic parenchyma without ductal dilatation or mass. Spleen: Spleen slightly enlarged at 13.2 cm.  Splenules. Adrenals/Urinary Tract: Adrenal glands are preserved. Slightly malrotated right kidney. No enhancing renal mass or collecting system dilatation. Collapsed urinary bladder. Stomach/Bowel: Stomach is mildly distended with contrast. The duodenal bulb, C-loop and sweep have a normal course and caliber. Oral contrast seen in the stomach and small bowel without small bowel dilatation. Preserved terminal ileum. Normal appendix right lower quadrant posterior to the cecum on axial image 68 of series 3. The large bowel has scattered colonic stool and is nondilated. Mildly redundant course of the sigmoid colon. Small hiatal hernia. Vascular/Lymphatic: Normal caliber aorta and IVC. There are several prominent retroperitoneal nodes, more than usually seen but not pathologic by size criteria. Similar pelvic nodes. This includes along the pelvic sidewall and iliac chains. Reproductive: Enlarged postpartum uterus as per provided history with significant pelvic fat stranding and ill-defined fluid. There is also what appear to be enlarged ovaries. On the left measuring 6.5 by 4.7 by 6.7 cm and right measuring 8.2 by 5.3 by 5.0 cm. Somewhat atypical. These were seen as enlarged on the recent ultrasound as well. Other: Anasarca. Musculoskeletal: Patient is tilted in the scanner. Mild hypertrophic changes along the pubic symphysis, nonspecific. IMPRESSION: As per history there is a postpartum uterus which is enlarged with pelvic stranding and ill-defined fluid. Note is made of enlarged ovaries bilaterally, nonspecific but slightly atypical. Please correlate with clinical history. The ovaries were enlarged on the recent ultrasound as well. Few prominent retroperitoneal nodes, nonspecific. No bowel obstruction.  Scattered stool.  Normal appendix. Mild  splenomegaly.  Again the patient is postpartum. Small hiatal hernia. Electronically Signed   By: Jill Side M.D.   On: 02/17/2022 16:23    Meds ordered this encounter  Medications   hydrOXYzine (VISTARIL) injection 25 mg   simethicone (MYLICON) chewable tablet 80 mg   iohexol (OMNIPAQUE) 350 MG/ML injection 75 mL   docusate sodium (COLACE) 100 MG capsule    Sig: Take 1 capsule (100 mg total) by mouth 2 (two) times daily as needed.    Dispense:  30 capsule    Refill:  2    Order Specific Question:   Supervising Provider    Answer:   Truett Mainland [[id number]]   polyethylene glycol (MIRALAX) 17 g packet    Sig: Take 17 g by mouth daily.    Dispense:  14 each    Refill:  0    Order Specific Question:   Supervising Provider    Answer:   Truett Mainland [[id number]]   senna-docusate (SENOKOT-S) 8.6-50 MG tablet    Sig: Take 1 tablet by mouth at bedtime as needed for up to 14 doses for  mild constipation. Take if no bowel movement after 24 hours of Colace and Miralax    Dispense:  14 tablet    Refill:  0    Order Specific Question:   Supervising Provider    Answer:   Levie Heritage [[id number]]   traMADol (ULTRAM) 50 MG tablet    Sig: Take 1 tablet (50 mg total) by mouth every 6 (six) hours as needed for up to 5 doses.    Dispense:  5 tablet    Refill:  0    Order Specific Question:   Supervising Provider    Answer:   Levie Heritage [[id number]]   gabapentin (NEURONTIN) 100 MG capsule    Sig: Take 1 capsule (100 mg total) by mouth 3 (three) times daily for 5 days.    Dispense:  15 capsule    Refill:  0    Order Specific Question:   Supervising Provider    Answer:   Levie Heritage [[id number]]   Assessment and Plan  --27 y.o. [id number] s/p vaginal birth over intact perineum on 02/12/2022 --Ongoing musculoskeletal pain in the immediate postpartum period --Patient without pain, ambulating independently as of 1650 --Short course pain management to facilitate recovery --Constipation regimen per patient  request --CHTN on Labetalol 200 mg BID, titration declined by patient --Consider AMB referral to physical therapy if complaints persist --Care coordinated with Dr. Adrian Blackwater --Discharge home in stable condition with return precautions  Calvert Cantor, MSA, MSN, CNM

## 2022-02-17 NOTE — MAU Note (Signed)
Transport arrived to take patient to CT.

## 2022-02-18 ENCOUNTER — Ambulatory Visit: Payer: Self-pay

## 2022-02-18 NOTE — Lactation Note (Signed)
This note was copied from a baby's chart.  NICU Lactation Consultation Note  Patient Name: Kelli Mcpherson [id number] Date: 02/18/2022 Age:27 years  Subjective Reason for consult: Follow-up assessment; NICU baby; Early term 74-38.6wks; Breastfeeding assistance; Maternal endocrine disorder  Visited with family of 35 days old ETI NICU female; Kelli Mcpherson is a P2 and has some experience pumping with her first baby. NICU RN Kelli Mcpherson. called this LC to assist with the latch, MOB wanted to try a NS # 24. She was already trying to latch at the bare breast but baby was exhibiting the same behavior she did two days ago, head bobbing and kept looking for the nipple even though is was in her mouth. Applied the NS # 24 with the same results, Kelli Mcpherson requested a bottle and this time baby was able to take it without crying, she was coordinated with her sucking pattern and able to SBS without any stressor signs. Discharge is planned for tomorrow, referral for [facility] OP has already been sent to [facility] No other support person at this time. All questions and concerns answered, family to contact [facility] services PRN.    Objective Infant data: Mother's Current Feeding Choice: Breast Milk and Formula  Infant feeding assessment Scale for Readiness: 1 Scale for Quality: 3  Maternal data: G2P1102  Vaginal, Spontaneous WIC Program: Yes Pump: Stork Pump (Spectra)  Assessment Infant: LATCH Score: 6  Feeding Status: Ad lib  Intervention/Plan Interventions: Breast feeding basics reviewed; Assisted with latch; Breast compression; Support pillows; Adjust position; DEBP; Education Tools: Nipple Shields Nipple shield size: 24  Plan: Consult Status: Complete   Kelli Mcpherson S Kelli Mcpherson 02/18/2022, 3:05 PM

## 2022-02-19 ENCOUNTER — Telehealth (HOSPITAL_COMMUNITY): Payer: Self-pay | Admitting: *Deleted

## 2022-02-19 NOTE — Telephone Encounter (Signed)
Patient voiced no questions or concerns regarding her health at this time. EPDS=0. Patient voiced no questions or concerns regarding infant at this time. Patient reports infant sleeps in a bassinet on her back. RN reviewed ABCs of safe sleep. Patient verbalized understanding. Patient informed about hospital's virtual postpartum classes and support groups. Declined email information at this time. Erline Levine, RN, 02/19/22, [phone]

## 2022-03-25 DIAGNOSIS — I10 Essential (primary) hypertension: Secondary | ICD-10-CM | POA: Diagnosis not present

## 2022-03-25 DIAGNOSIS — Z309 Encounter for contraceptive management, unspecified: Secondary | ICD-10-CM | POA: Diagnosis not present

## 2022-04-23 ENCOUNTER — Other Ambulatory Visit: Payer: Self-pay

## 2022-04-23 ENCOUNTER — Encounter (HOSPITAL_COMMUNITY): Payer: Self-pay | Admitting: Emergency Medicine

## 2022-04-23 ENCOUNTER — Ambulatory Visit (HOSPITAL_COMMUNITY)
Admission: EM | Admit: 2022-04-23 | Discharge: 2022-04-23 | Disposition: A | Discharge status: Home / Self Care | Payer: Managed Care, Other (non HMO) | Attending: Sports Medicine | Admitting: Sports Medicine

## 2022-04-23 DIAGNOSIS — N898 Other specified noninflammatory disorders of vagina: Secondary | ICD-10-CM | POA: Diagnosis present

## 2022-04-23 DIAGNOSIS — J029 Acute pharyngitis, unspecified: Secondary | ICD-10-CM | POA: Insufficient documentation

## 2022-04-23 LAB — POCT RAPID STREP A, ED / UC: Streptococcus, Group A Screen (Direct): NEGATIVE

## 2022-04-23 MED ORDER — LIDOCAINE VISCOUS HCL 2 % MT SOLN: 15.0000 mL | OROMUCOSAL | 0 refills | Status: DC | PRN

## 2022-04-23 MED ORDER — METRONIDAZOLE 500 MG PO TABS: 500.0000 mg | ORAL_TABLET | Freq: Two times a day (BID) | ORAL | 0 refills | Status: DC

## 2022-04-23 NOTE — ED Provider Notes (Signed)
[place]   [id number] 04/23/22 Arrival Time: [id number]  ASSESSMENT & PLAN:  1. Viral pharyngitis   2. Vaginal discharge    -History and exam is consistent viral pharyngitis.  Her roof her mouth does have some vesicles on erythematous base looks clinically concerning for HSV.  Will have HSV swab sent to the lab for analysis.  Her rapid strep test was negative, will send to the lab for culture for confirmation.  I called in viscous lidocaine for symptomatic relief.  -History seems consistent with previous BV symptoms.  Will have her do a self swab sent to lab for testing.  Will treat empirically with Flagyl twice daily x 7 days.  Will call her if results are positive.  All questions answered and she agrees to plan  Meds ordered this encounter  Medications   lidocaine (XYLOCAINE) 2 % solution    Sig: Use as directed 15 mLs in the mouth or throat as needed for mouth pain.    Dispense:  100 mL    Refill:  0   metroNIDAZOLE (FLAGYL) 500 MG tablet    Sig: Take 1 tablet (500 mg total) by mouth 2 (two) times daily.    Dispense:  14 tablet    Refill:  0   Discharge Instructions   None       Reviewed expectations re: course of current medical issues. Questions answered. Outlined signs and symptoms indicating need for more acute intervention. Patient verbalized understanding. After Visit Summary given.   SUBJECTIVE: Pleasant 27 year old female comes urgent care to be evaluated for vaginal discharge and sore throat.  Vaginal discharge started yesterday.  She says it is thin and gray.  There is an odor to it.  Seems similar to when she had BV in the past.  No itching or irritation.  No new soaps.  Not sexually active.  Sore throat for the last 3 days.  Extremely painful in the back of the throat especially with swallowing.  She is tried Robitussin as many better.  Unsure of sick contacts but she does have 2 small children.  No cough.  No fevers.  No LMP recorded. Past  Surgical History:  Procedure Laterality Date   WISDOM TOOTH EXTRACTION       OBJECTIVE:  Vitals:   04/23/22 1034  BP: 129/88  Pulse: 83  Resp: 20  Temp: 98.3 F (36.8 C)  TempSrc: Oral  SpO2: 95%     Physical Exam Vitals reviewed.  Constitutional:      General: She is not in acute distress.    Appearance: Normal appearance.  HENT:     Right Ear: Tympanic membrane normal.     Left Ear: Tympanic membrane normal.     Nose: No congestion.     Mouth/Throat:     Lips: Pink.     Mouth: Oral lesions (numerous vesicles on an erythematous base on roof of mouth) present.     Pharynx: Posterior oropharyngeal erythema present.  Cardiovascular:     Rate and Rhythm: Normal rate.  Pulmonary:     Effort: Pulmonary effort is normal.  Abdominal:     Palpations: Abdomen is soft.     Tenderness: There is no abdominal tenderness.  Neurological:     General: No focal deficit present.     Mental Status: She is alert.  Psychiatric:        Mood and Affect: Mood normal.      Labs: Results for orders placed or performed during the hospital  encounter of 04/23/22  POCT Rapid Strep A  Result Value Ref Range   Streptococcus, Group A Screen (Direct) NEGATIVE NEGATIVE   Labs Reviewed  CULTURE, GROUP A STREP [facility])  HSV CULTURE AND TYPING  POCT RAPID STREP A, ED / UC  CERVICOVAGINAL ANCILLARY ONLY    Imaging: No results found.   No Known Allergies                                             Past Medical History:  Diagnosis Date   Chlamydia    COVID-19    DKA (diabetic ketoacidosis) 05/29/2019   Eczema    Genital herpes    Gonorrhea    Pre-eclampsia 06/08/2019   Preeclampsia    Type 2 diabetes mellitus    UTI (urinary tract infection)    Vaginal Pap smear, abnormal     Social History   Socioeconomic History   Marital status: Single    Spouse name: Not on file   Number of children: Not on file   Years of education: Not on file   Highest education level: Not on file   Occupational History   Not on file  Tobacco Use   Smoking status: Never   Smokeless tobacco: Never   Tobacco comments:    Patient occasionally smokes hookah   Vaping Use   Vaping Use: Former   Substances: Nicotine, Flavoring  Substance and Sexual Activity   Alcohol use: Not Currently    Comment: socially   Drug use: Never   Sexual activity: Not Currently    Birth control/protection: None  Other Topics Concern   Not on file  Social History Narrative   Not on file   Social Determinants of Health   Financial Resource Strain: Not on file  Food Insecurity: No Food Insecurity (02/11/2022)   Hunger Vital Sign    Worried About Running Out of Food in the Last Year: Never true    Ran Out of Food in the Last Year: Never true  Transportation Needs: No Transportation Needs (02/11/2022)   PRAPARE - Hydrologist (Medical): No    Lack of Transportation (Non-Medical): No  Physical Activity: Not on file  Stress: Not on file  Social Connections: Not on file  Intimate Partner Violence: Not At Risk (02/11/2022)   Humiliation, Afraid, Rape, and Kick questionnaire    Fear of Current or Ex-Partner: No    Emotionally Abused: No    Physically Abused: No    Sexually Abused: No    Family History  Problem Relation Age of Onset   Diabetes Mother    Hypertension Mother    Hypertension Father    Asthma Neg Hx    Cancer Neg Hx    Birth defects Neg Hx    Heart disease Neg Hx    Stroke Neg Hx       Decklyn Hyder, Dorian Pod, MD 04/23/22 1136

## 2022-04-23 NOTE — ED Triage Notes (Signed)
Patient also reports vaginal discharge and requesting a swab for this concern.  Denies urinary symptoms

## 2022-04-23 NOTE — ED Triage Notes (Signed)
Sore throat started 3 days ago.  Feels like a lot of cuts in throat and gums.  Patient took robitussin extra strength

## 2022-04-23 NOTE — Discharge Instructions (Addendum)
Will call you if swab tests are positive Take flagyl 2x per day for 7 days  Swish and spit the lidocaine as needed for mouth pain  It was nice to meet you today!

## 2022-04-23 NOTE — ED Notes (Signed)
Reviewed work note 

## 2022-04-24 LAB — CERVICOVAGINAL ANCILLARY ONLY
Bacterial Vaginitis (gardnerella): NEGATIVE
Candida Glabrata: NEGATIVE
Candida Vaginitis: NEGATIVE
Comment: NEGATIVE
Comment: NEGATIVE
Comment: NEGATIVE

## 2022-04-25 LAB — CULTURE, GROUP A STREP (THRC)

## 2022-04-25 LAB — HSV CULTURE AND TYPING

## 2022-08-15 IMAGING — US US OB < 14 WEEKS - US OB TV
1 series · 15 of 28 positions shown · non-contrast
Comparison: Non obstetric Ob ultrasound 09/17/2020

CLINICAL DATA: Pain. Quantitative HCG pending. Last menstrual
period 04/28/2021 which would correspond to estimated gestational
age of 9 weeks 3 days.

EXAM:
OBSTETRIC <14 WK US AND TRANSVAGINAL OB
US DOPPLER ULTRASOUND OF OVARIES
TECHNIQUE: Both transabdominal and transvaginal ultrasound examinations were
performed for complete evaluation of the gestation as well as the
maternal uterus, adnexal regions, and pelvic cul-de-sac.
Transvaginal technique was performed to assess early pregnancy.
Color and duplex Doppler ultrasound was utilized to evaluate blood
flow to the ovaries.

[Series 1: us ob < 14 weeks - us ob tv · 15 of 41 slices shown]
[im 1/41]
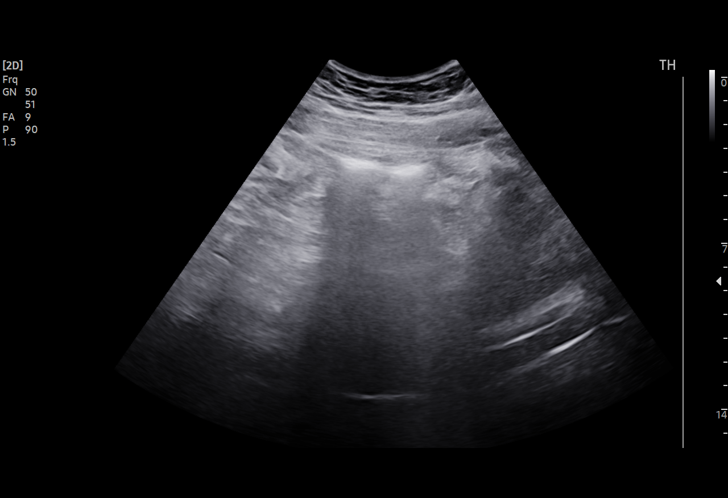
[im 3/41]
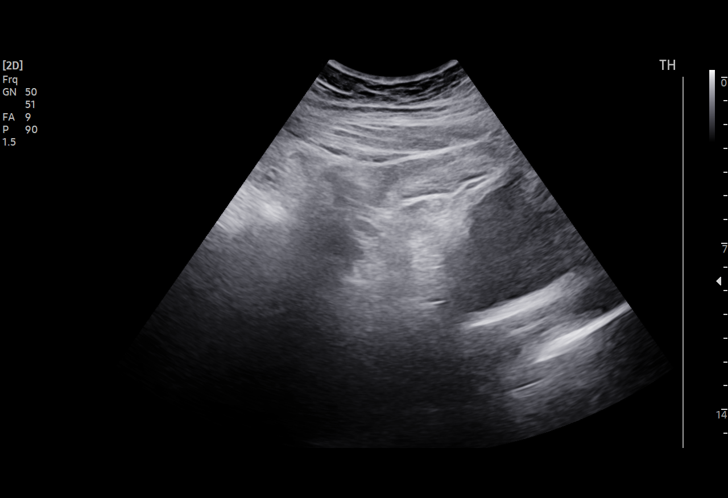
[im 6/41]
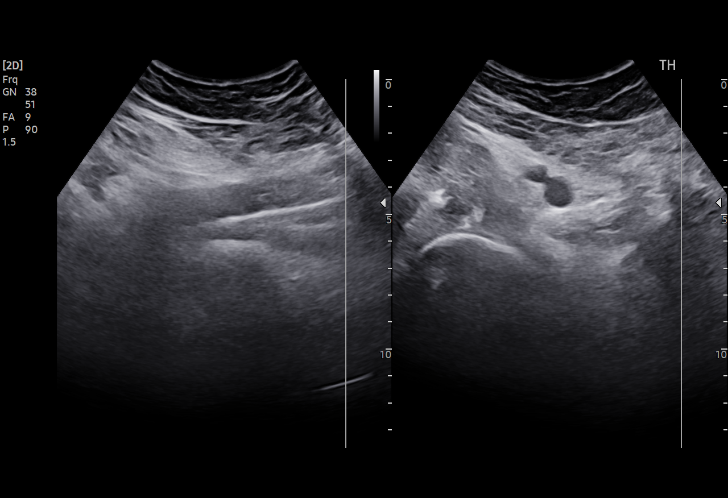
[im 9/41]
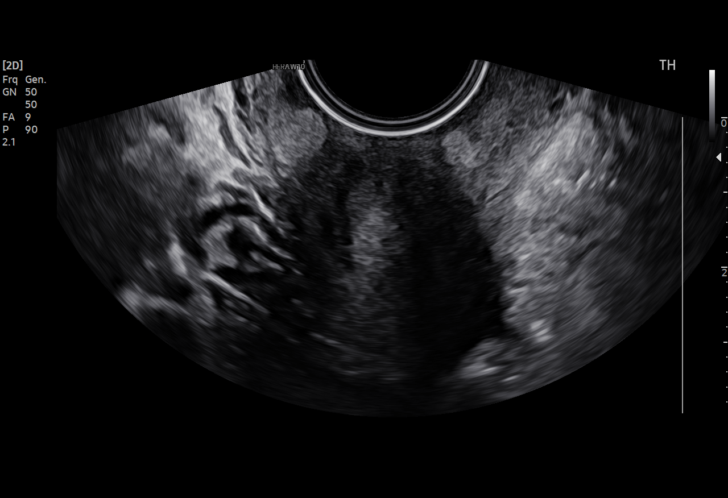
[im 12/41]
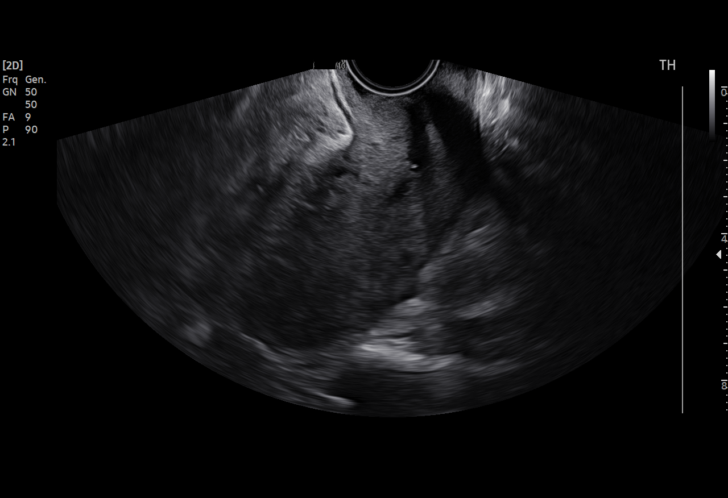
[im 15/41]
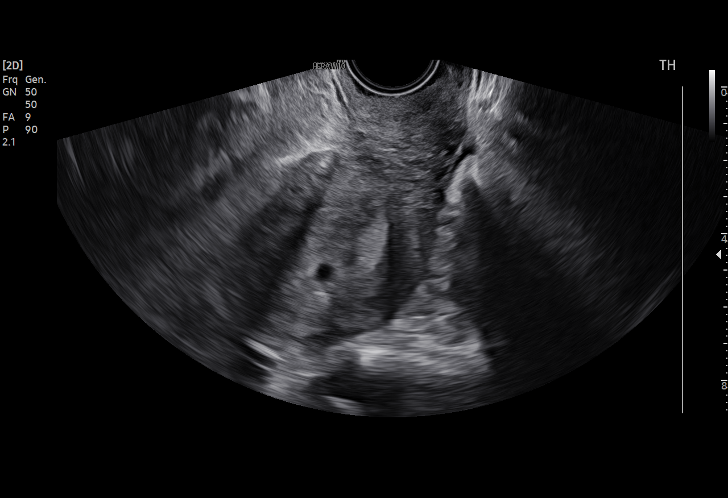
[im 18/41]
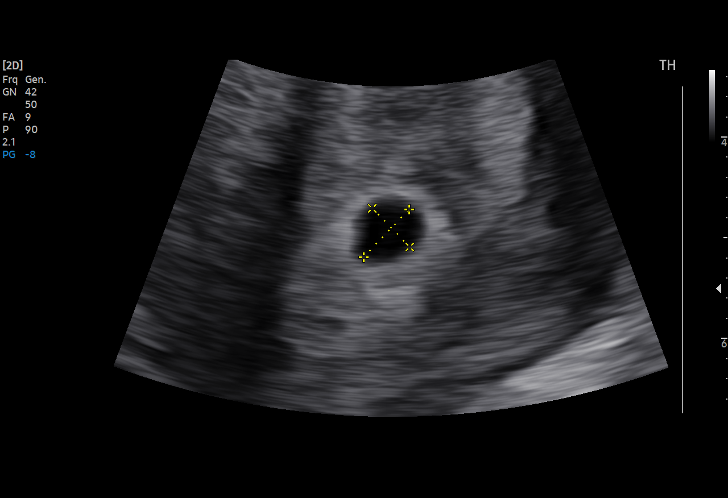
[im 21/41]
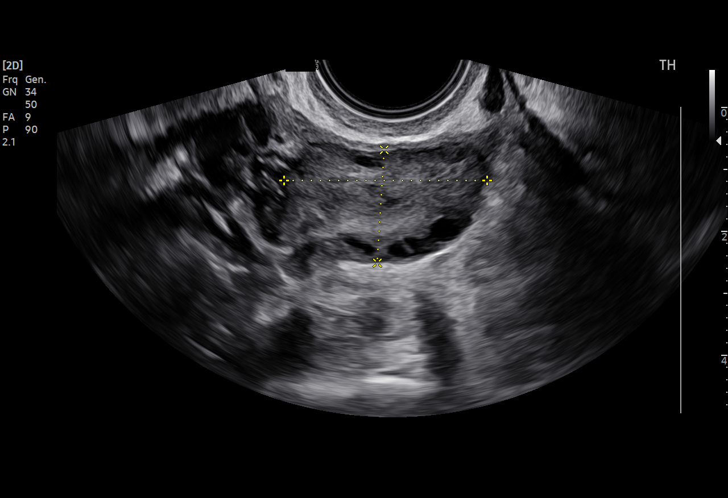
[im 23/41]
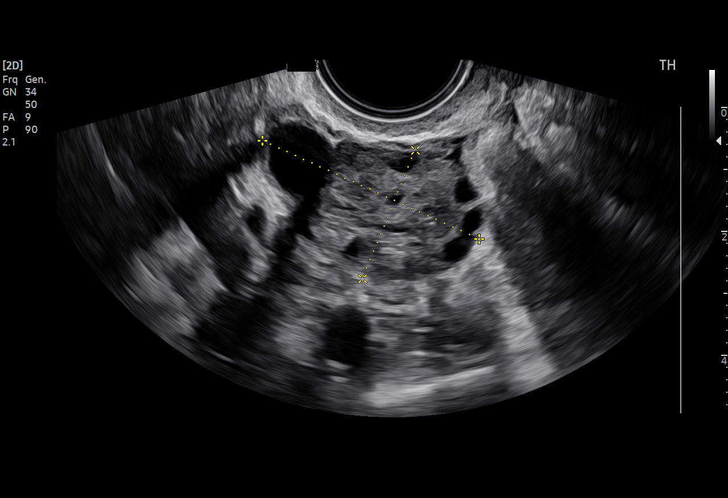
[im 26/41]
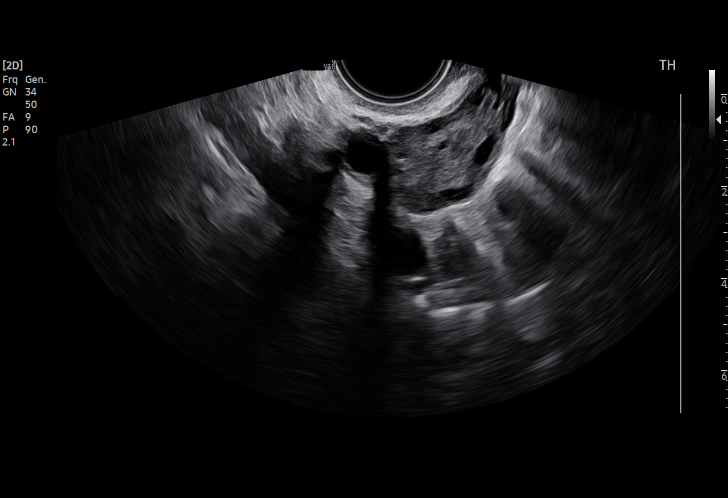
[im 29/41]
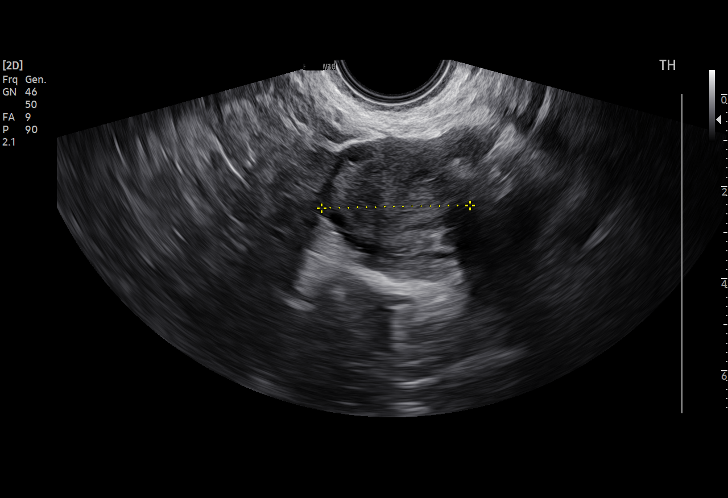
[im 32/41]
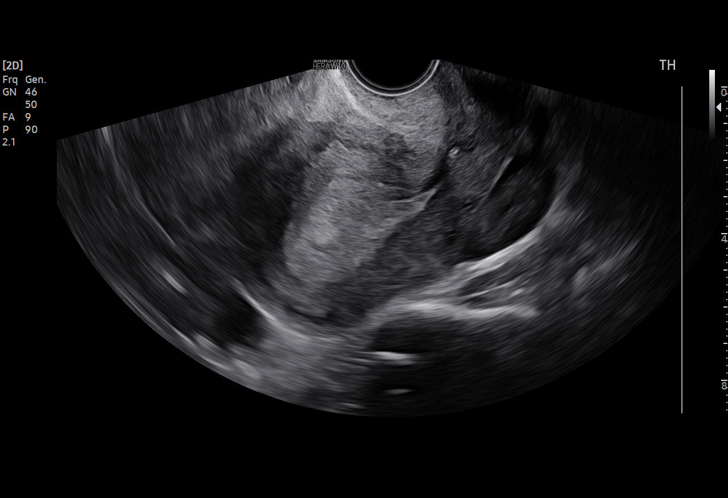
[im 35/41]
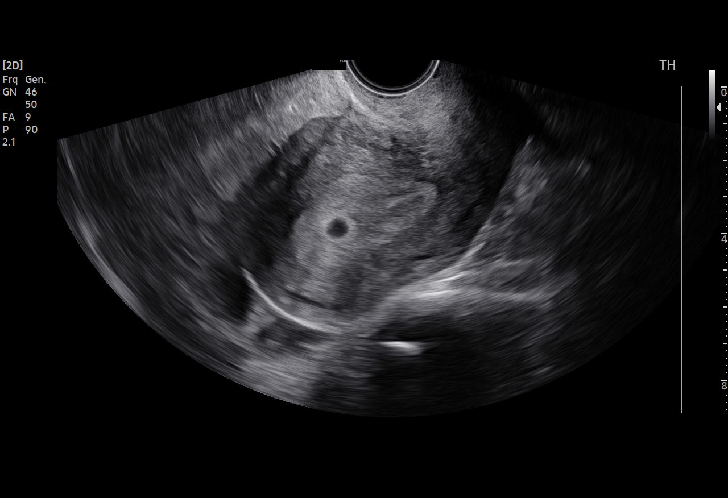
[im 38/41]
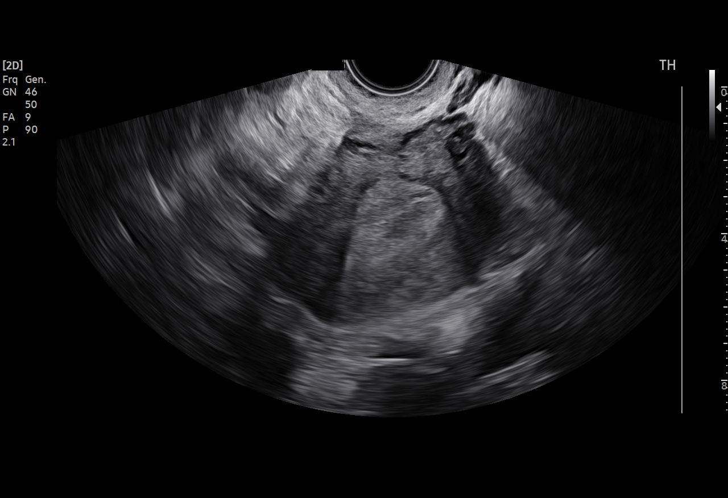
[im 41/41]
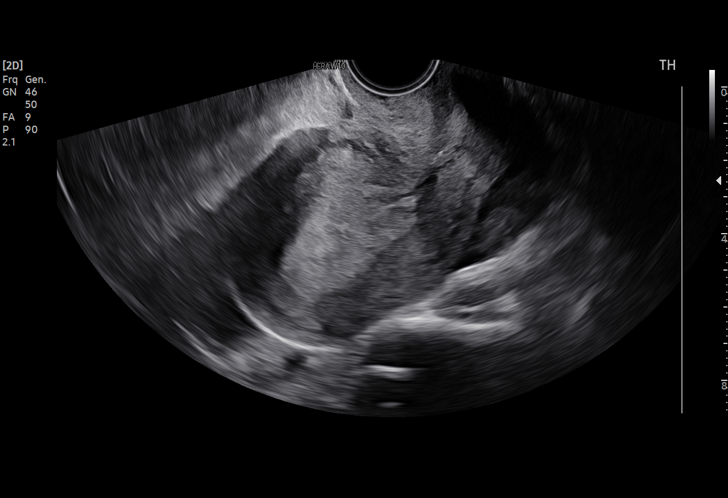

[15 of 28 positions shown; findings below may reference images not displayed]

FINDINGS: Intrauterine gestational sac: Single

Yolk sac:  Not Visualized.

Embryo:  Not Visualized.

Cardiac Activity: Not Visualized.

MSD: 6.3 mm   5 w   2 d

Subchorionic hemorrhage:  None visualized.

Maternal uterus/adnexae: Within normal limits.

Pulsed Doppler evaluation of both ovaries demonstrates normal
appearing low-resistance arterial and venous waveforms.
IMPRESSION: There is anechoic structure within the endometrial canal suggesting
a gestational sac with surrounding decidual reaction. Probable early
intrauterine gestational sac, but no yolk sac, fetal pole, or
cardiac activity yet visualized. Recommend follow-up quantitative
B-HCG levels and follow-up US in 14 days to assess viability. This
recommendation follows SRU consensus guidelines: Diagnostic Criteria
for Nonviable Pregnancy Early in the First Trimester. N Engl J Med

## 2022-12-01 DIAGNOSIS — R21 Rash and other nonspecific skin eruption: Secondary | ICD-10-CM | POA: Diagnosis present

## 2022-12-01 DIAGNOSIS — Z5321 Procedure and treatment not carried out due to patient leaving prior to being seen by health care provider: Secondary | ICD-10-CM | POA: Insufficient documentation

## 2022-12-02 ENCOUNTER — Emergency Department (HOSPITAL_COMMUNITY)
Admission: EM | Admit: 2022-12-02 | Discharge: 2022-12-02 | Discharge status: Against Medical Advice | Payer: Managed Care, Other (non HMO) | Attending: Emergency Medicine | Admitting: Emergency Medicine

## 2022-12-02 ENCOUNTER — Other Ambulatory Visit: Payer: Self-pay

## 2022-12-02 ENCOUNTER — Encounter (HOSPITAL_COMMUNITY): Payer: Self-pay | Admitting: Emergency Medicine

## 2022-12-02 NOTE — ED Triage Notes (Signed)
Pt in with bleeding skin tag lateral to R nipple. States she was pumping today, and began to have pain and noticed bleeding from the area. Pt continues to breastfeed and is newly pregnant again. Moist gauze applied to area in triage

## 2022-12-03 ENCOUNTER — Inpatient Hospital Stay (HOSPITAL_COMMUNITY)
Admission: AD | Admit: 2022-12-03 | Discharge: 2022-12-04 | Disposition: A | Discharge status: Home / Self Care | Payer: Managed Care, Other (non HMO) | Attending: Obstetrics and Gynecology | Admitting: Obstetrics and Gynecology

## 2022-12-03 ENCOUNTER — Other Ambulatory Visit: Payer: Self-pay

## 2022-12-03 ENCOUNTER — Encounter (HOSPITAL_COMMUNITY): Payer: Self-pay

## 2022-12-03 DIAGNOSIS — L918 Other hypertrophic disorders of the skin: Secondary | ICD-10-CM

## 2022-12-03 DIAGNOSIS — Z3201 Encounter for pregnancy test, result positive: Secondary | ICD-10-CM

## 2022-12-03 DIAGNOSIS — O99711 Diseases of the skin and subcutaneous tissue complicating pregnancy, first trimester: Secondary | ICD-10-CM | POA: Insufficient documentation

## 2022-12-03 DIAGNOSIS — Z3A08 8 weeks gestation of pregnancy: Secondary | ICD-10-CM

## 2022-12-03 NOTE — MAU Note (Addendum)
.  Kelli Mcpherson is a 27 y.o. at [redacted]w[redacted]d here in MAU reporting: skin tag on right breast next to nipple "never irritated me before" but Saturday it started to swell and bleed. Went to ER last night - they put wet gauze on it but it has continued to bleed. Has not been able to pump or breastfeed on that side so she is wondering what else can be done. States it only hurts during breastfeeding/pumping and that pain is 10 on 0-10 pain scale. Reports she has always had this skin tag, but this is the first time she has ever had a problem with it.  LMP: 10/09/2022 Onset of complaint: Saturday  Pain score: 0 Vitals:   12/03/22 2337  BP: (!) 152/75  Pulse: 64  Resp: 16  Temp: 98.4 F (36.9 C)  SpO2: 100%     FHT:NA  Lab orders placed from triage:  none

## 2022-12-03 NOTE — Progress Notes (Signed)
Lactation at bedside. °

## 2022-12-04 DIAGNOSIS — Z3201 Encounter for pregnancy test, result positive: Secondary | ICD-10-CM | POA: Diagnosis present

## 2022-12-04 DIAGNOSIS — Z3A08 8 weeks gestation of pregnancy: Secondary | ICD-10-CM | POA: Diagnosis not present

## 2022-12-04 DIAGNOSIS — L918 Other hypertrophic disorders of the skin: Secondary | ICD-10-CM

## 2022-12-04 DIAGNOSIS — O99711 Diseases of the skin and subcutaneous tissue complicating pregnancy, first trimester: Secondary | ICD-10-CM | POA: Diagnosis not present

## 2022-12-04 NOTE — Lactation Note (Signed)
Lactation Consultation Note  Patient Name: Kelli Mcpherson [id number] Date: 12/04/2022 Age:27 y.o. Reason for consult: Initial assessment Called to see mom in MAU. Noted large skin tag on Rt. Areola beside of nipple. Mom has been BF her daughter for 9 months and hasn't had any trouble w/it until now. Mom stated when she pumps it bleeds.  Suggested mom go to the Dermatologist to have skin tag removed. Until then hand express that breast to prevent engorgement. Mom is BF on the Lt. Breast until issue resolves.  Maternal Data    Feeding    LATCH Score       Type of Nipple: Everted at rest and after stimulation  Comfort (Breast/Nipple): Engorged, cracked, bleeding, large blisters, severe discomfort (mom has skin tag on areola beside nipple that is inflamed and hurting and bleeding.)         Lactation Tools Discussed/Used    Interventions    Discharge Discharge Education: Outpatient recommendation;Other (comment) (Go to Dermatologist to have skin tag removed.)  Consult Status Consult Status: Complete    Marisel Tostenson G 12/04/2022, 12:01 AM

## 2022-12-04 NOTE — MAU Provider Note (Signed)
Chief Complaint: Breast Problem   Event Date/Time   First Provider Initiated Contact with Patient 12/04/22 0001        SUBJECTIVE HPI: Kelli Mcpherson is a 27 y.o. G3P1102 at [redacted]w[redacted]d by LMP who presents to maternity admissions reporting having had a skin tag on right breast/nipple "all my life".  But recently it started to become irritated and bled some.  Only hurts when breastfeeding. She denies vaginal bleeding, h/a, n/v, or fever/chills.    Other This is a new problem. The current episode started in the past 7 days. The problem has been unchanged. Pertinent negatives include no chills, fever or nausea. Exacerbated by: breastfeeding. She has tried nothing for the symptoms.   RN Note: Kelli Mcpherson is a 27 y.o. at [redacted]w[redacted]d here in MAU reporting: skin tag on right breast next to nipple "never irritated me before" but Saturday it started to swell and bleed. Went to ER last night - they put wet gauze on it but it has continued to bleed. Has not been able to pump or breastfeed on that side so she is wondering what else can be done. States it only hurts during breastfeeding/pumping and that pain is 10 on 0-10 pain scale. Reports she has always had this skin tag, but this is the first time she has ever had a problem with it.  LMP: 10/09/2022      Onset of complaint: Saturday     Pain score: 0  Past Medical History:  Diagnosis Date   Chlamydia    COVID-19    DKA (diabetic ketoacidosis) (HCC) 05/29/2019   Eczema    Genital herpes    Gonorrhea    Pre-eclampsia 06/08/2019   Preeclampsia    Type 2 diabetes mellitus (HCC)    UTI (urinary tract infection)    Vaginal Pap smear, abnormal    Past Surgical History:  Procedure Laterality Date   WISDOM TOOTH EXTRACTION     Social History   Socioeconomic History   Marital status: Single    Spouse name: Not on file   Number of children: Not on file   Years of education: Not on file   Highest education level: Not on file  Occupational History    Not on file  Tobacco Use   Smoking status: Never   Smokeless tobacco: Never   Tobacco comments:    Patient occasionally smokes hookah   Vaping Use   Vaping status: Former   Substances: Nicotine, Flavoring  Substance and Sexual Activity   Alcohol use: Not Currently    Comment: socially   Drug use: Never   Sexual activity: Not Currently    Birth control/protection: None  Other Topics Concern   Not on file  Social History Narrative   Not on file   Social Determinants of Health   Financial Resource Strain: Not on file  Food Insecurity: No Food Insecurity (02/11/2022)   Hunger Vital Sign    Worried About Running Out of Food in the Last Year: Never true    Ran Out of Food in the Last Year: Never true  Transportation Needs: No Transportation Needs (02/11/2022)   PRAPARE - Administrator, Civil Service (Medical): No    Lack of Transportation (Non-Medical): No  Physical Activity: Not on file  Stress: Not on file  Social Connections: Unknown (06/03/2021)   [facility]   Social Network    Social Network: Not on file  Intimate Partner Violence: Not At  Risk (02/11/2022)   Humiliation, Afraid, Rape, and Kick questionnaire    Fear of Current or Ex-Partner: No    Emotionally Abused: No    Physically Abused: No    Sexually Abused: No   No current facility-administered medications on file prior to encounter.   Current Outpatient Medications on File Prior to Encounter  Medication Sig Dispense Refill   aspirin EC 81 MG tablet Take 81 mg by mouth 2 (two) times daily. Swallow whole.     Continuous Blood Gluc Receiver (DEXCOM G7 RECEIVER) DEVI Use to monitor BG continuously 1 each 0   Continuous Blood Gluc Sensor (DEXCOM G7 SENSOR) MISC CHANGE SENSOR EVERY 10 DAYS 3 each 2   docusate sodium (COLACE) 100 MG capsule Take 1 capsule (100 mg total) by mouth 2 (two) times daily as needed. 30 capsule 2   gabapentin (NEURONTIN) 100 MG capsule Take 1  capsule (100 mg total) by mouth 3 (three) times daily for 5 days. 15 capsule 0   Insulin Syringe-Needle U-100 29G X 1/2" 0.3 ML MISC Use 4 times per day to administer insulin 200 each 0   labetalol (NORMODYNE) 200 MG tablet Take 1 tablet (200 mg total) by mouth 2 (two) times daily. 60 tablet 3   lidocaine (XYLOCAINE) 2 % solution Use as directed 15 mLs in the mouth or throat as needed for mouth pain. 100 mL 0   metroNIDAZOLE (FLAGYL) 500 MG tablet Take 1 tablet (500 mg total) by mouth 2 (two) times daily. 14 tablet 0   polyethylene glycol (MIRALAX) 17 g packet Take 17 g by mouth daily. 14 each 0   Prenatal Multivit-Min-FA ([facility] PRENATAL GUMMY) 0.4 MG CHEW Chew 1 Units by mouth daily. 30 tablet 10   senna-docusate (SENOKOT-S) 8.6-50 MG tablet Take 1 tablet by mouth at bedtime as needed for up to 14 doses for mild constipation. Take if no bowel movement after 24 hours of Colace and Miralax 14 tablet 0   traMADol (ULTRAM) 50 MG tablet Take 1 tablet (50 mg total) by mouth every 6 (six) hours as needed for up to 5 doses. (Patient not taking: Reported on 04/23/2022) 5 tablet 0   valACYclovir (VALTREX) 500 MG tablet Take 1 tablet (500 mg total) by mouth 2 (two) times daily. Take for 3 days with onset of outbreak 6 tablet 10   [DISCONTINUED] enalapril (VASOTEC) 10 MG tablet Take 1 tablet (10 mg total) by mouth daily. 30 tablet 0   No Known Allergies  I have reviewed patient's Past Medical Hx, Surgical Hx, Family Hx, Social Hx, medications and allergies.   ROS:  Review of Systems  Constitutional:  Negative for chills and fever.  Gastrointestinal:  Negative for nausea.  Skin:  Positive for wound.   Review of Systems  Other systems negative   Physical Exam  Physical Exam Patient Vitals for the past 24 hrs:  BP Temp Temp src Pulse Resp SpO2 Height Weight  12/03/22 2337 (!) 152/75 98.4 F (36.9 C) Oral 64 16 100 % 6' (1.829 m) 134.6 kg   Constitutional: Well-developed, well-nourished female in no  acute distress.  Cardiovascular: normal rate Respiratory: normal effort Skin:  Small skin tag right breast with erythema/wound  GI: Abd soft, non-tender.  MS: Extremities nontender, no edema, normal ROM Neurologic: Alert and oriented x 4.  GU: Neg CVAT.  LAB RESULTS No results found for this or any previous visit (from the past 24 hour(s)).  --/--/B POS (01/23 1942)  IMAGING No results found.  MAU Management/MDM: I have reviewed the triage vital signs and the nursing notes.   Pertinent labs & imaging results that were available during my care of the patient were reviewed by me and considered in my medical decision making (see chart for details).      I have reviewed her medical records including past results, notes and treatments. Medical, Surgical, and family history were reviewed.  Medications and recent lab tests were reviewed  Consulted Lactation Consultant who came and looked at skin tag She recommends hand expression of milk until the patient can have skin tag removed and it heals Recommend dry dressing with neosporin over wound Pt to ask office to refer her to Dermatology   ASSESSMENT Skin tag on right nipple with excoriation due to breastfeeding  PLAN Discharge home Call office in am for referral to Dermatology Keep dry dressing and neosporin over wound  Pt stable at time of discharge. Encouraged to return here if she develops worsening of symptoms, increase in pain, fever, or other concerning symptoms.    Wynelle Bourgeois CNM, MSN Certified Nurse-Midwife 12/04/2022  12:01 AM

## 2022-12-24 DIAGNOSIS — Z3481 Encounter for supervision of other normal pregnancy, first trimester: Secondary | ICD-10-CM | POA: Diagnosis not present

## 2022-12-24 DIAGNOSIS — Z3A10 10 weeks gestation of pregnancy: Secondary | ICD-10-CM | POA: Diagnosis not present

## 2022-12-24 LAB — OB RESULTS CONSOLE HIV ANTIBODY (ROUTINE TESTING): HIV: NONREACTIVE

## 2022-12-24 LAB — OB RESULTS CONSOLE RUBELLA ANTIBODY, IGM: Rubella: IMMUNE

## 2022-12-24 LAB — OB RESULTS CONSOLE RPR: RPR: NONREACTIVE

## 2022-12-24 LAB — OB RESULTS CONSOLE HEPATITIS B SURFACE ANTIGEN: Hepatitis B Surface Ag: NEGATIVE

## 2022-12-24 LAB — HEPATITIS C ANTIBODY: HCV Ab: NEGATIVE

## 2023-01-01 LAB — OB RESULTS CONSOLE GC/CHLAMYDIA
Chlamydia: NEGATIVE
Neisseria Gonorrhea: NEGATIVE

## 2023-01-05 DIAGNOSIS — O0990 Supervision of high risk pregnancy, unspecified, unspecified trimester: Secondary | ICD-10-CM | POA: Diagnosis not present

## 2023-01-05 DIAGNOSIS — Z8751 Personal history of pre-term labor: Secondary | ICD-10-CM | POA: Diagnosis not present

## 2023-01-05 DIAGNOSIS — I10 Essential (primary) hypertension: Secondary | ICD-10-CM | POA: Diagnosis not present

## 2023-01-05 DIAGNOSIS — E119 Type 2 diabetes mellitus without complications: Secondary | ICD-10-CM | POA: Diagnosis not present

## 2023-01-05 DIAGNOSIS — Z8759 Personal history of other complications of pregnancy, childbirth and the puerperium: Secondary | ICD-10-CM | POA: Diagnosis not present

## 2023-01-09 ENCOUNTER — Telehealth: Payer: Self-pay

## 2023-01-09 ENCOUNTER — Other Ambulatory Visit: Payer: Managed Care, Other (non HMO)

## 2023-01-09 ENCOUNTER — Other Ambulatory Visit: Payer: Self-pay

## 2023-01-09 DIAGNOSIS — Z794 Long term (current) use of insulin: Secondary | ICD-10-CM

## 2023-01-09 NOTE — Telephone Encounter (Signed)
Referral says Abnormal Glucose, but her Dx list says DM Type 2.   Orders placed under that Dx code in chart.   Orders Placed This Encounter  Procedures   CBC with Differential/Platelet   Comprehensive metabolic panel   Hemoglobin A1c   Lipid panel   Microalbumin / creatinine urine ratio   Insulin, random

## 2023-01-12 ENCOUNTER — Ambulatory Visit: Payer: Managed Care, Other (non HMO) | Admitting: "Endocrinology

## 2023-01-12 LAB — CBC WITH DIFFERENTIAL/PLATELET
Absolute Lymphocytes: 1585 {cells}/uL (ref 850–3900)
Absolute Monocytes: 345 {cells}/uL (ref 200–950)
Basophils Absolute: 10 {cells}/uL (ref 0–200)
Basophils Relative: 0.2 %
Eosinophils Absolute: 40 {cells}/uL (ref 15–500)
Eosinophils Relative: 0.8 %
HCT: 36.7 % (ref 35.0–45.0)
Hemoglobin: 12.2 g/dL (ref 11.7–15.5)
MCH: 29 pg (ref 27.0–33.0)
MCHC: 33.2 g/dL (ref 32.0–36.0)
MCV: 87.2 fL (ref 80.0–100.0)
MPV: 13.2 fL — ABNORMAL HIGH (ref 7.5–12.5)
Monocytes Relative: 6.9 %
Neutro Abs: 3020 {cells}/uL (ref 1500–7800)
Neutrophils Relative %: 60.4 %
Platelets: 187 10*3/uL (ref 140–400)
RBC: 4.21 10*6/uL (ref 3.80–5.10)
RDW: 13.2 % (ref 11.0–15.0)
Total Lymphocyte: 31.7 %
WBC: 5 10*3/uL (ref 3.8–10.8)

## 2023-01-12 LAB — COMPREHENSIVE METABOLIC PANEL
AG Ratio: 1.5 (calc) (ref 1.0–2.5)
ALT: 8 U/L (ref 6–29)
AST: 8 U/L — ABNORMAL LOW (ref 10–30)
Albumin: 4.4 g/dL (ref 3.6–5.1)
Alkaline phosphatase (APISO): 46 U/L (ref 31–125)
BUN/Creatinine Ratio: 16 (calc) (ref 6–22)
BUN: 7 mg/dL (ref 7–25)
CO2: 20 mmol/L (ref 20–32)
Calcium: 9.3 mg/dL (ref 8.6–10.2)
Chloride: 104 mmol/L (ref 98–110)
Creat: 0.45 mg/dL — ABNORMAL LOW (ref 0.50–0.96)
Globulin: 2.9 g/dL (ref 1.9–3.7)
Glucose, Bld: 100 mg/dL — ABNORMAL HIGH (ref 65–99)
Potassium: 3.8 mmol/L (ref 3.5–5.3)
Sodium: 136 mmol/L (ref 135–146)
Total Bilirubin: 0.4 mg/dL (ref 0.2–1.2)
Total Protein: 7.3 g/dL (ref 6.1–8.1)

## 2023-01-12 LAB — LIPID PANEL
Cholesterol: 172 mg/dL (ref <200)
HDL: 55 mg/dL (ref >=50)
LDL Cholesterol (Calc): 92 mg/dL
Non-HDL Cholesterol (Calc): 117 mg/dL (ref <130)
Total CHOL/HDL Ratio: 3.1 (calc) (ref <5.0)
Triglycerides: 156 mg/dL — ABNORMAL HIGH (ref <150)

## 2023-01-12 LAB — MICROALBUMIN / CREATININE URINE RATIO
Creatinine, Urine: 290 mg/dL — ABNORMAL HIGH (ref 20–275)
Microalb Creat Ratio: 4 mg/g{creat} (ref <30)
Microalb, Ur: 1.3 mg/dL

## 2023-01-12 LAB — HEMOGLOBIN A1C
Hgb A1c MFr Bld: 6.2 %{Hb} — ABNORMAL HIGH (ref <5.7)
Mean Plasma Glucose: 131 mg/dL
eAG (mmol/L): 7.3 mmol/L

## 2023-01-12 LAB — INSULIN, RANDOM: Insulin: 20.2 u[IU]/mL — ABNORMAL HIGH

## 2023-01-16 DIAGNOSIS — Z8759 Personal history of other complications of pregnancy, childbirth and the puerperium: Secondary | ICD-10-CM | POA: Diagnosis not present

## 2023-01-16 DIAGNOSIS — O0990 Supervision of high risk pregnancy, unspecified, unspecified trimester: Secondary | ICD-10-CM | POA: Diagnosis not present

## 2023-01-16 DIAGNOSIS — E119 Type 2 diabetes mellitus without complications: Secondary | ICD-10-CM | POA: Diagnosis not present

## 2023-01-16 DIAGNOSIS — Z8751 Personal history of pre-term labor: Secondary | ICD-10-CM | POA: Diagnosis not present

## 2023-01-16 DIAGNOSIS — I10 Essential (primary) hypertension: Secondary | ICD-10-CM | POA: Diagnosis not present

## 2023-01-21 NOTE — L&D Delivery Note (Signed)
 Delivery Note At bedside, rapid progression from 4 cm to crowning in less than 1 hr.   At 3:07 AM a viable female was delivered via Vaginal, Spontaneous (Presentation: Middle Occiput Anterior).  APGAR: pend; weight pend .   Placenta status: Spontaneous, Intact.  Cord: 3 vessels with the following complications: None  Anesthesia: None Episiotomy: None Lacerations:  None Suture Repair: None Est. Blood Loss (mL):  300 cc  TXA given for some increased bleeding. Improved with evacuation of clot from lower uterine segment and fundal massage.   Mom to postpartum.  Baby to Couplet care / Skin to Skin.  Gretchen Leavell 06/26/2023, 3:31 AM

## 2023-01-30 DIAGNOSIS — Z361 Encounter for antenatal screening for raised alphafetoprotein level: Secondary | ICD-10-CM | POA: Diagnosis not present

## 2023-01-30 DIAGNOSIS — O139 Gestational [pregnancy-induced] hypertension without significant proteinuria, unspecified trimester: Secondary | ICD-10-CM | POA: Diagnosis not present

## 2023-02-19 DIAGNOSIS — E119 Type 2 diabetes mellitus without complications: Secondary | ICD-10-CM | POA: Diagnosis not present

## 2023-02-19 DIAGNOSIS — Z8751 Personal history of pre-term labor: Secondary | ICD-10-CM | POA: Diagnosis not present

## 2023-02-19 DIAGNOSIS — I10 Essential (primary) hypertension: Secondary | ICD-10-CM | POA: Diagnosis not present

## 2023-02-19 DIAGNOSIS — Z8759 Personal history of other complications of pregnancy, childbirth and the puerperium: Secondary | ICD-10-CM | POA: Diagnosis not present

## 2023-02-19 DIAGNOSIS — O0990 Supervision of high risk pregnancy, unspecified, unspecified trimester: Secondary | ICD-10-CM | POA: Diagnosis not present

## 2023-02-25 DIAGNOSIS — Z8759 Personal history of other complications of pregnancy, childbirth and the puerperium: Secondary | ICD-10-CM | POA: Diagnosis not present

## 2023-02-25 DIAGNOSIS — O0990 Supervision of high risk pregnancy, unspecified, unspecified trimester: Secondary | ICD-10-CM | POA: Diagnosis not present

## 2023-02-25 DIAGNOSIS — Z8751 Personal history of pre-term labor: Secondary | ICD-10-CM | POA: Diagnosis not present

## 2023-02-25 DIAGNOSIS — E119 Type 2 diabetes mellitus without complications: Secondary | ICD-10-CM | POA: Diagnosis not present

## 2023-02-25 DIAGNOSIS — I10 Essential (primary) hypertension: Secondary | ICD-10-CM | POA: Diagnosis not present

## 2023-02-27 DIAGNOSIS — Z3A20 20 weeks gestation of pregnancy: Secondary | ICD-10-CM | POA: Diagnosis not present

## 2023-02-27 DIAGNOSIS — Z363 Encounter for antenatal screening for malformations: Secondary | ICD-10-CM | POA: Diagnosis not present

## 2023-02-27 DIAGNOSIS — O24112 Pre-existing diabetes mellitus, type 2, in pregnancy, second trimester: Secondary | ICD-10-CM | POA: Diagnosis not present

## 2023-02-27 DIAGNOSIS — Z331 Pregnant state, incidental: Secondary | ICD-10-CM | POA: Diagnosis not present

## 2023-02-27 DIAGNOSIS — O99212 Obesity complicating pregnancy, second trimester: Secondary | ICD-10-CM | POA: Diagnosis not present

## 2023-02-27 DIAGNOSIS — Z3400 Encounter for supervision of normal first pregnancy, unspecified trimester: Secondary | ICD-10-CM | POA: Diagnosis not present

## 2023-03-03 DIAGNOSIS — O210 Mild hyperemesis gravidarum: Secondary | ICD-10-CM

## 2023-03-04 ENCOUNTER — Ambulatory Visit: Payer: Managed Care, Other (non HMO) | Admitting: "Endocrinology

## 2023-03-05 DIAGNOSIS — O0990 Supervision of high risk pregnancy, unspecified, unspecified trimester: Secondary | ICD-10-CM | POA: Diagnosis not present

## 2023-03-05 DIAGNOSIS — Z8751 Personal history of pre-term labor: Secondary | ICD-10-CM | POA: Diagnosis not present

## 2023-03-05 DIAGNOSIS — I10 Essential (primary) hypertension: Secondary | ICD-10-CM | POA: Diagnosis not present

## 2023-03-05 DIAGNOSIS — E119 Type 2 diabetes mellitus without complications: Secondary | ICD-10-CM | POA: Diagnosis not present

## 2023-03-05 DIAGNOSIS — Z8759 Personal history of other complications of pregnancy, childbirth and the puerperium: Secondary | ICD-10-CM | POA: Diagnosis not present

## 2023-03-12 DIAGNOSIS — Z8759 Personal history of other complications of pregnancy, childbirth and the puerperium: Secondary | ICD-10-CM | POA: Diagnosis not present

## 2023-03-12 DIAGNOSIS — E119 Type 2 diabetes mellitus without complications: Secondary | ICD-10-CM | POA: Diagnosis not present

## 2023-03-12 DIAGNOSIS — Z8751 Personal history of pre-term labor: Secondary | ICD-10-CM | POA: Diagnosis not present

## 2023-03-12 DIAGNOSIS — O0990 Supervision of high risk pregnancy, unspecified, unspecified trimester: Secondary | ICD-10-CM | POA: Diagnosis not present

## 2023-03-12 DIAGNOSIS — I10 Essential (primary) hypertension: Secondary | ICD-10-CM | POA: Diagnosis not present

## 2023-03-18 ENCOUNTER — Other Ambulatory Visit: Payer: Self-pay

## 2023-03-18 ENCOUNTER — Inpatient Hospital Stay (HOSPITAL_COMMUNITY)
Admission: AD | Admit: 2023-03-18 | Discharge: 2023-03-19 | Disposition: A | Discharge status: Home / Self Care | Payer: Medicaid Other | Attending: Obstetrics and Gynecology | Admitting: Obstetrics and Gynecology

## 2023-03-18 DIAGNOSIS — O99512 Diseases of the respiratory system complicating pregnancy, second trimester: Secondary | ICD-10-CM | POA: Diagnosis present

## 2023-03-18 DIAGNOSIS — K529 Noninfective gastroenteritis and colitis, unspecified: Secondary | ICD-10-CM | POA: Diagnosis not present

## 2023-03-18 DIAGNOSIS — J069 Acute upper respiratory infection, unspecified: Secondary | ICD-10-CM

## 2023-03-18 DIAGNOSIS — O218 Other vomiting complicating pregnancy: Secondary | ICD-10-CM | POA: Insufficient documentation

## 2023-03-18 DIAGNOSIS — Z79899 Other long term (current) drug therapy: Secondary | ICD-10-CM | POA: Insufficient documentation

## 2023-03-18 DIAGNOSIS — Z794 Long term (current) use of insulin: Secondary | ICD-10-CM | POA: Insufficient documentation

## 2023-03-18 DIAGNOSIS — Z3A23 23 weeks gestation of pregnancy: Secondary | ICD-10-CM | POA: Insufficient documentation

## 2023-03-18 DIAGNOSIS — R110 Nausea: Secondary | ICD-10-CM

## 2023-03-18 DIAGNOSIS — O98512 Other viral diseases complicating pregnancy, second trimester: Secondary | ICD-10-CM | POA: Diagnosis not present

## 2023-03-18 DIAGNOSIS — Z1152 Encounter for screening for COVID-19: Secondary | ICD-10-CM | POA: Insufficient documentation

## 2023-03-18 DIAGNOSIS — O24112 Pre-existing diabetes mellitus, type 2, in pregnancy, second trimester: Secondary | ICD-10-CM | POA: Insufficient documentation

## 2023-03-18 DIAGNOSIS — O132 Gestational [pregnancy-induced] hypertension without significant proteinuria, second trimester: Secondary | ICD-10-CM | POA: Insufficient documentation

## 2023-03-18 DIAGNOSIS — O99892 Other specified diseases and conditions complicating childbirth: Secondary | ICD-10-CM | POA: Diagnosis not present

## 2023-03-18 DIAGNOSIS — B9789 Other viral agents as the cause of diseases classified elsewhere: Secondary | ICD-10-CM | POA: Insufficient documentation

## 2023-03-18 MED ORDER — ACETAMINOPHEN 500 MG PO TABS
1000.0000 mg | ORAL_TABLET | Freq: Once | ORAL | Status: AC
  Administered 2023-03-19: 1000 mg via ORAL
  Filled 2023-03-18: qty 2

## 2023-03-18 MED ORDER — LACTATED RINGERS IV BOLUS
1000.0000 mL | Freq: Once | INTRAVENOUS | Status: AC
  Administered 2023-03-18: 1000 mL via INTRAVENOUS

## 2023-03-18 MED ORDER — ONDANSETRON HCL 4 MG/2ML IJ SOLN
4.0000 mg | Freq: Once | INTRAMUSCULAR | Status: AC
  Administered 2023-03-18: 4 mg via INTRAVENOUS
  Filled 2023-03-18: qty 2

## 2023-03-18 NOTE — MAU Note (Signed)
.  Kelli Mcpherson is a 28 y.o. at [redacted]w[redacted]d here in MAU reporting: feel like she has a head cold for 2 days, but feels worse today - stuffy, HA, and upper abdominal pain - "stomach feels like it's in knots". N/V for the past hour. Denies taking medication for the pain. Denies VB or LOF. CHTN - on labetalol mg twice a day  Onset of complaint: all day Pain score: 8 - HA; 9 - abdomen Vitals:   03/18/23 2222  BP: (!) 145/73  Pulse: (!) 106  Resp: 18  Temp: 98.6 F (37 C)  SpO2: 99%     FHT: 162  Lab orders placed from triage: UA

## 2023-03-19 ENCOUNTER — Encounter (HOSPITAL_COMMUNITY): Payer: Self-pay | Admitting: Obstetrics and Gynecology

## 2023-03-19 DIAGNOSIS — J069 Acute upper respiratory infection, unspecified: Secondary | ICD-10-CM | POA: Diagnosis not present

## 2023-03-19 DIAGNOSIS — Z3A23 23 weeks gestation of pregnancy: Secondary | ICD-10-CM | POA: Diagnosis not present

## 2023-03-19 DIAGNOSIS — O99892 Other specified diseases and conditions complicating childbirth: Secondary | ICD-10-CM | POA: Diagnosis not present

## 2023-03-19 LAB — COMPREHENSIVE METABOLIC PANEL
ALT: 9 U/L (ref 0–44)
AST: 13 U/L — ABNORMAL LOW (ref 15–41)
Albumin: 3.5 g/dL (ref 3.5–5.0)
Alkaline Phosphatase: 54 U/L (ref 38–126)
Anion gap: 11 (ref 5–15)
BUN: 7 mg/dL (ref 6–20)
CO2: 21 mmol/L — ABNORMAL LOW (ref 22–32)
Calcium: 9.4 mg/dL (ref 8.9–10.3)
Chloride: 106 mmol/L (ref 98–111)
Creatinine, Ser: 0.64 mg/dL (ref 0.44–1.00)
GFR, Estimated: >60 mL/min (ref >60)
Glucose, Bld: 101 mg/dL — ABNORMAL HIGH (ref 70–99)
Potassium: 3.9 mmol/L (ref 3.5–5.1)
Sodium: 138 mmol/L (ref 135–145)
Total Bilirubin: 0.4 mg/dL (ref 0.0–1.2)
Total Protein: 7.3 g/dL (ref 6.5–8.1)

## 2023-03-19 LAB — RESP PANEL BY RT-PCR (RSV, FLU A&B, COVID)  RVPGX2
Influenza A by PCR: NEGATIVE
Influenza B by PCR: NEGATIVE
Resp Syncytial Virus by PCR: NEGATIVE
SARS Coronavirus 2 by RT PCR: NEGATIVE

## 2023-03-19 LAB — URINALYSIS, ROUTINE W REFLEX MICROSCOPIC
Bilirubin Urine: NEGATIVE
Glucose, UA: NEGATIVE mg/dL
Hgb urine dipstick: NEGATIVE
Ketones, ur: NEGATIVE mg/dL
Nitrite: NEGATIVE
Protein, ur: 30 mg/dL — AB
Specific Gravity, Urine: 1.026 (ref 1.005–1.030)
pH: 6 (ref 5.0–8.0)

## 2023-03-19 LAB — CBC
HCT: 38.8 % (ref 36.0–46.0)
Hemoglobin: 12.6 g/dL (ref 12.0–15.0)
MCH: 29.2 pg (ref 26.0–34.0)
MCHC: 32.5 g/dL (ref 30.0–36.0)
MCV: 89.8 fL (ref 80.0–100.0)
Platelets: 213 10*3/uL (ref 150–400)
RBC: 4.32 MIL/uL (ref 3.87–5.11)
RDW: 12.7 % (ref 11.5–15.5)
WBC: 10.8 10*3/uL — ABNORMAL HIGH (ref 4.0–10.5)
nRBC: 0 % (ref 0.0–0.2)

## 2023-03-19 LAB — GLUCOSE, CAPILLARY
Glucose-Capillary: 126 mg/dL — ABNORMAL HIGH (ref 70–99)
Glucose-Capillary: 147 mg/dL — ABNORMAL HIGH (ref 70–99)

## 2023-03-19 MED ORDER — LIDOCAINE VISCOUS HCL 2 % MT SOLN
15.0000 mL | Freq: Once | OROMUCOSAL | Status: AC
  Administered 2023-03-19: 15 mL via ORAL
  Filled 2023-03-19: qty 15

## 2023-03-19 MED ORDER — FAMOTIDINE IN NACL 20-0.9 MG/50ML-% IV SOLN
20.0000 mg | Freq: Once | INTRAVENOUS | Status: AC
  Administered 2023-03-19: 20 mg via INTRAVENOUS
  Filled 2023-03-19: qty 50

## 2023-03-19 MED ORDER — ALUM & MAG HYDROXIDE-SIMETH 200-200-20 MG/5ML PO SUSP
30.0000 mL | Freq: Once | ORAL | Status: AC
  Administered 2023-03-19: 30 mL via ORAL
  Filled 2023-03-19: qty 30

## 2023-03-19 MED ORDER — INSULIN GLARGINE 100 UNIT/ML ~~LOC~~ SOLN
26.0000 [IU] | Freq: Once | SUBCUTANEOUS | Status: AC
  Administered 2023-03-19: 26 [IU] via SUBCUTANEOUS
  Filled 2023-03-19: qty 0.26

## 2023-03-19 MED ORDER — SODIUM CHLORIDE 0.9 % IV SOLN: INTRAVENOUS | Status: DC | PRN

## 2023-03-19 MED ORDER — ONDANSETRON 4 MG PO TBDP: 4.0000 mg | ORAL_TABLET | Freq: Three times a day (TID) | ORAL | 0 refills | Status: DC | PRN

## 2023-03-19 MED ORDER — SODIUM CHLORIDE 0.9 % IV SOLN
25.0000 mg | Freq: Once | INTRAVENOUS | Status: DC
  Filled 2023-03-19: qty 1

## 2023-03-19 MED ORDER — ONDANSETRON 4 MG PO TBDP
4.0000 mg | ORAL_TABLET | Freq: Once | ORAL | Status: AC
  Administered 2023-03-19: 4 mg via ORAL
  Filled 2023-03-19: qty 1

## 2023-03-19 MED ORDER — LACTATED RINGERS IV SOLN: INTRAVENOUS | Status: DC

## 2023-03-19 MED ORDER — PROMETHAZINE HCL 25 MG/ML IJ SOLN: 25.0000 mg | Freq: Once | INTRAMUSCULAR | Status: DC

## 2023-03-19 MED ORDER — PROMETHAZINE HCL 12.5 MG PO TABS: 12.5000 mg | ORAL_TABLET | Freq: Four times a day (QID) | ORAL | 0 refills | Status: DC | PRN

## 2023-03-19 MED ORDER — SCOPOLAMINE 1 MG/3DAYS TD PT72
1.0000 | MEDICATED_PATCH | Freq: Once | TRANSDERMAL | Status: DC
  Administered 2023-03-19: 1.5 mg via TRANSDERMAL
  Filled 2023-03-19: qty 1

## 2023-03-19 MED ORDER — PROCHLORPERAZINE EDISYLATE 10 MG/2ML IJ SOLN
10.0000 mg | INTRAMUSCULAR | Status: AC
  Administered 2023-03-19: 10 mg via INTRAVENOUS
  Filled 2023-03-19: qty 2

## 2023-03-19 NOTE — Progress Notes (Signed)
 Pt called out stating she wants to go home. MD made aware.   [id number] Lucianne Muss, MD at bedside.

## 2023-03-19 NOTE — MAU Provider Note (Signed)
 History     CSN: [id number]  Arrival date and time: 03/18/23 2202   Event Date/Time   First Provider Initiated Contact with Patient    Chief Complaint  Patient presents with   Abdominal Pain   Headache   Nausea   Emesis    HPI  NATALINE BASARA is a 28 y.o. G3P1102 at [redacted]w[redacted]d who presents to the MAU for cold symptoms, abdominal pain, nausea, and vomiting. Ongoing cold sxs x 2 days including cough, congestion. She describes feeling a knot in her stomach. No fevers, chills, c/d, urinary sxs, VB, LOF. Has known hx cHTN and is on Labetalol 400mg  bid and has T2DM is on insulin.  Past Medical History:  Diagnosis Date   Chlamydia    COVID-19    DKA (diabetic ketoacidosis) (HCC) 05/29/2019   Eczema    Genital herpes    Gonorrhea    Pre-eclampsia 06/08/2019   Preeclampsia    Type 2 diabetes mellitus (HCC)    UTI (urinary tract infection)    Vaginal Pap smear, abnormal     Past Surgical History:  Procedure Laterality Date   WISDOM TOOTH EXTRACTION      Family History  Problem Relation Age of Onset   Diabetes Mother    Hypertension Mother    Hypertension Father    Asthma Neg Hx    Cancer Neg Hx    Birth defects Neg Hx    Heart disease Neg Hx    Stroke Neg Hx     Social History   Tobacco Use   Smoking status: Never   Smokeless tobacco: Never   Tobacco comments:    Patient occasionally smokes hookah   Vaping Use   Vaping status: Former   Substances: Nicotine, Flavoring  Substance Use Topics   Alcohol use: Not Currently    Comment: socially   Drug use: Never    Allergies: No Known Allergies  No medications prior to admission.    ROS reviewed and pertinent positives and negatives as documented in HPI.  Physical Exam   Blood pressure 135/67, pulse 99, temperature 98.9 F (37.2 C), temperature source Oral, resp. rate 18, height 5\' 11"  (1.803 m), weight (!) 142.7 kg, last menstrual period 10/09/2022, SpO2 99%, currently breastfeeding.  Physical  Exam Constitutional:      General: She is not in acute distress.    Appearance: Normal appearance. She is not ill-appearing.  HENT:     Head: Normocephalic and atraumatic.  Cardiovascular:     Rate and Rhythm: Normal rate.  Pulmonary:     Effort: Pulmonary effort is normal.     Breath sounds: Normal breath sounds.  Abdominal:     Palpations: Abdomen is soft.     Tenderness: There is no abdominal tenderness. There is no guarding.  Musculoskeletal:        General: Normal range of motion.  Skin:    General: Skin is warm and dry.     Findings: No rash.  Neurological:     General: No focal deficit present.     Mental Status: She is alert and oriented to person, place, and time.     MAU Course  Procedures  MDM 28 y.o. G3P1102 at [redacted]w[redacted]d presenting for cough, congestion, and acute onset nausea/vomiting. She is well appearing, abdominal exam is benign. COVID/flu/RSV negative. CBC, CMP reassuring. Pt given 1L LR, IV Zofran, and GI cocktail initially which she said relieved nausea/vomiting. Discussed return precautions with her and importance of hydration. When RN went  in to provide discharge info, pt had two episodes of emesis. Given improvement w Zofran, ODT Zofran given w/o relief. IV reinserted and Compazine and Pepcid given w/o significant relief. Pt had continued bouts of emesis and IV Phenergan in 1L NS ordered, however pt declined and requested discharge home with Zofran. I expressed concern for re-presentation to MAU if n/v not adequately controlled. Pt expressed understanding. Continued to request discharge. Phenergan and Zofran sent to pharmacy.  Assessment and Plan  Viral URI  Nausea  Acute gastroenteritis Suspect viral process Hemodynamically stable Labs reassuring Pt received Zofran, GI cocktail, Compazine, IV fluids, and Pepcid given -- rec IV Phenergan, but pt declined and opted for d/c  Rx for Zofran and Phenergan sent Discharged home per pt request Return precautions  discussed   Sundra Aland, MD OB Fellow, Faculty Practice [facility], [facility]  03/19/2023, 4:29 AM

## 2023-03-22 ENCOUNTER — Telehealth: Payer: Self-pay | Admitting: Obstetrics and Gynecology

## 2023-03-22 MED ORDER — SCOPOLAMINE 1 MG/3DAYS TD PT72SCOPOLAMINE 1 MG/3DAYS
1.0000 | MEDICATED_PATCH | TRANSDERMAL | 0 refills | Status: DC
Start: 2023-03-22 — End: 2023-06-27

## 2023-03-22 NOTE — Telephone Encounter (Signed)
 TC transferred from [employer]. ID verified by name, DOB and address.  Patient calling requesting to be Rx'd the patch that she was given prior to being d/c'd from MAU at her last visit. She reports "it is about to expire and I don't want to start getting sick again." She reports she has the "one that dissolves under my tongue and that didn't work when I was there the last time."When asked if she had tried the Promethazine she also was prescribed she reported that "nothing worked."  [facility] Sc[facility] [address] per pt request. Patient advised to f/u with [facility] ([facility] for any further medication needs. Patient also advised that she can insert the Promethazine tablets that she has at home vaginally, if unable to keep anything down by mouth. Patient verbalized an understanding of the plan of care and agrees.   Raelyn Mora, CNM  03/22/2023 8:42 PM

## 2023-03-23 DIAGNOSIS — O0990 Supervision of high risk pregnancy, unspecified, unspecified trimester: Secondary | ICD-10-CM | POA: Diagnosis not present

## 2023-03-23 DIAGNOSIS — Z8751 Personal history of pre-term labor: Secondary | ICD-10-CM | POA: Diagnosis not present

## 2023-03-23 DIAGNOSIS — E119 Type 2 diabetes mellitus without complications: Secondary | ICD-10-CM | POA: Diagnosis not present

## 2023-03-23 DIAGNOSIS — I10 Essential (primary) hypertension: Secondary | ICD-10-CM | POA: Diagnosis not present

## 2023-03-23 DIAGNOSIS — Z8759 Personal history of other complications of pregnancy, childbirth and the puerperium: Secondary | ICD-10-CM | POA: Diagnosis not present

## 2023-03-30 DIAGNOSIS — I10 Essential (primary) hypertension: Secondary | ICD-10-CM | POA: Diagnosis not present

## 2023-03-30 DIAGNOSIS — Z8751 Personal history of pre-term labor: Secondary | ICD-10-CM | POA: Diagnosis not present

## 2023-03-30 DIAGNOSIS — Z8759 Personal history of other complications of pregnancy, childbirth and the puerperium: Secondary | ICD-10-CM | POA: Diagnosis not present

## 2023-03-30 DIAGNOSIS — E119 Type 2 diabetes mellitus without complications: Secondary | ICD-10-CM | POA: Diagnosis not present

## 2023-03-30 DIAGNOSIS — O0990 Supervision of high risk pregnancy, unspecified, unspecified trimester: Secondary | ICD-10-CM | POA: Diagnosis not present

## 2023-03-31 DIAGNOSIS — O0992 Supervision of high risk pregnancy, unspecified, second trimester: Secondary | ICD-10-CM | POA: Diagnosis not present

## 2023-03-31 DIAGNOSIS — O24112 Pre-existing diabetes mellitus, type 2, in pregnancy, second trimester: Secondary | ICD-10-CM | POA: Diagnosis not present

## 2023-03-31 DIAGNOSIS — Z362 Encounter for other antenatal screening follow-up: Secondary | ICD-10-CM | POA: Diagnosis not present

## 2023-03-31 DIAGNOSIS — N760 Acute vaginitis: Secondary | ICD-10-CM | POA: Diagnosis not present

## 2023-03-31 DIAGNOSIS — Z113 Encounter for screening for infections with a predominantly sexual mode of transmission: Secondary | ICD-10-CM | POA: Diagnosis not present

## 2023-03-31 DIAGNOSIS — O99212 Obesity complicating pregnancy, second trimester: Secondary | ICD-10-CM | POA: Diagnosis not present

## 2023-03-31 DIAGNOSIS — Z3A24 24 weeks gestation of pregnancy: Secondary | ICD-10-CM | POA: Diagnosis not present

## 2023-03-31 DIAGNOSIS — Z3482 Encounter for supervision of other normal pregnancy, second trimester: Secondary | ICD-10-CM | POA: Diagnosis not present

## 2023-03-31 DIAGNOSIS — N771 Vaginitis, vulvitis and vulvovaginitis in diseases classified elsewhere: Secondary | ICD-10-CM | POA: Diagnosis not present

## 2023-03-31 DIAGNOSIS — O10012 Pre-existing essential hypertension complicating pregnancy, second trimester: Secondary | ICD-10-CM | POA: Diagnosis not present

## 2023-04-08 DIAGNOSIS — E119 Type 2 diabetes mellitus without complications: Secondary | ICD-10-CM | POA: Diagnosis not present

## 2023-04-08 DIAGNOSIS — I10 Essential (primary) hypertension: Secondary | ICD-10-CM | POA: Diagnosis not present

## 2023-04-08 DIAGNOSIS — O0990 Supervision of high risk pregnancy, unspecified, unspecified trimester: Secondary | ICD-10-CM | POA: Diagnosis not present

## 2023-04-08 DIAGNOSIS — Z8759 Personal history of other complications of pregnancy, childbirth and the puerperium: Secondary | ICD-10-CM | POA: Diagnosis not present

## 2023-04-08 DIAGNOSIS — Z8751 Personal history of pre-term labor: Secondary | ICD-10-CM | POA: Diagnosis not present

## 2023-04-15 DIAGNOSIS — E119 Type 2 diabetes mellitus without complications: Secondary | ICD-10-CM | POA: Diagnosis not present

## 2023-04-15 DIAGNOSIS — Z8759 Personal history of other complications of pregnancy, childbirth and the puerperium: Secondary | ICD-10-CM | POA: Diagnosis not present

## 2023-04-15 DIAGNOSIS — Z8751 Personal history of pre-term labor: Secondary | ICD-10-CM | POA: Diagnosis not present

## 2023-04-15 DIAGNOSIS — O139 Gestational [pregnancy-induced] hypertension without significant proteinuria, unspecified trimester: Secondary | ICD-10-CM | POA: Diagnosis not present

## 2023-04-15 DIAGNOSIS — O0990 Supervision of high risk pregnancy, unspecified, unspecified trimester: Secondary | ICD-10-CM | POA: Diagnosis not present

## 2023-04-15 DIAGNOSIS — I10 Essential (primary) hypertension: Secondary | ICD-10-CM | POA: Diagnosis not present

## 2023-04-17 DIAGNOSIS — O99210 Obesity complicating pregnancy, unspecified trimester: Secondary | ICD-10-CM | POA: Insufficient documentation

## 2023-04-17 DIAGNOSIS — O10919 Unspecified pre-existing hypertension complicating pregnancy, unspecified trimester: Secondary | ICD-10-CM | POA: Insufficient documentation

## 2023-04-23 ENCOUNTER — Other Ambulatory Visit: Payer: Self-pay

## 2023-04-23 DIAGNOSIS — N760 Acute vaginitis: Secondary | ICD-10-CM | POA: Diagnosis not present

## 2023-04-23 DIAGNOSIS — O24111 Pre-existing diabetes mellitus, type 2, in pregnancy, first trimester: Secondary | ICD-10-CM

## 2023-04-23 DIAGNOSIS — O10919 Unspecified pre-existing hypertension complicating pregnancy, unspecified trimester: Secondary | ICD-10-CM

## 2023-04-23 DIAGNOSIS — O99210 Obesity complicating pregnancy, unspecified trimester: Secondary | ICD-10-CM

## 2023-04-23 DIAGNOSIS — Z3400 Encounter for supervision of normal first pregnancy, unspecified trimester: Secondary | ICD-10-CM | POA: Diagnosis not present

## 2023-04-23 DIAGNOSIS — Z3A28 28 weeks gestation of pregnancy: Secondary | ICD-10-CM | POA: Diagnosis not present

## 2023-04-23 DIAGNOSIS — Z23 Encounter for immunization: Secondary | ICD-10-CM | POA: Diagnosis not present

## 2023-04-24 ENCOUNTER — Ambulatory Visit: Attending: Obstetrics and Gynecology

## 2023-04-24 ENCOUNTER — Ambulatory Visit: Admitting: Maternal & Fetal Medicine

## 2023-04-24 VITALS — BP 130/60 | HR 85

## 2023-04-24 DIAGNOSIS — O24113 Pre-existing diabetes mellitus, type 2, in pregnancy, third trimester: Secondary | ICD-10-CM

## 2023-04-24 DIAGNOSIS — B009 Herpesviral infection, unspecified: Secondary | ICD-10-CM | POA: Diagnosis not present

## 2023-04-24 DIAGNOSIS — O99213 Obesity complicating pregnancy, third trimester: Secondary | ICD-10-CM

## 2023-04-24 DIAGNOSIS — Z794 Long term (current) use of insulin: Secondary | ICD-10-CM

## 2023-04-24 DIAGNOSIS — O10919 Unspecified pre-existing hypertension complicating pregnancy, unspecified trimester: Secondary | ICD-10-CM | POA: Diagnosis not present

## 2023-04-24 DIAGNOSIS — Z3A28 28 weeks gestation of pregnancy: Secondary | ICD-10-CM

## 2023-04-24 DIAGNOSIS — O10013 Pre-existing essential hypertension complicating pregnancy, third trimester: Secondary | ICD-10-CM

## 2023-04-24 DIAGNOSIS — E669 Obesity, unspecified: Secondary | ICD-10-CM | POA: Diagnosis not present

## 2023-04-24 DIAGNOSIS — O24111 Pre-existing diabetes mellitus, type 2, in pregnancy, first trimester: Secondary | ICD-10-CM

## 2023-04-24 DIAGNOSIS — E119 Type 2 diabetes mellitus without complications: Secondary | ICD-10-CM

## 2023-04-24 DIAGNOSIS — O99210 Obesity complicating pregnancy, unspecified trimester: Secondary | ICD-10-CM

## 2023-04-24 DIAGNOSIS — O98513 Other viral diseases complicating pregnancy, third trimester: Secondary | ICD-10-CM

## 2023-04-24 NOTE — Progress Notes (Signed)
 Patient information  Patient Name: Kelli Mcpherson  Patient MRN:   [id number]  Referring practice: [facility] Referring Provider: [facility]  [facility]FM CONSULT  Kelli Mcpherson is a 28 y.o. [id number] at [redacted]w[redacted]d here for ultrasound and consultation. Patient Active Problem List   Diagnosis Date Noted   Chronic hypertension during pregnancy, antepartum 04/17/2023   Obesity affecting pregnancy, antepartum 04/17/2023   Pregnancy induced hypertension 02/11/2022   Type 2 diabetes mellitus complicating pregnancy in first trimester, antepartum 09/10/2021   Diabetes mellitus (HCC) 07/03/2021   Class 3 severe obesity due to excess calories with serious comorbidity and body mass index (BMI) of 40.0 to 44.9 in adult [facility]) 09/07/2019   Genital herpes simplex 09/20/2018   Episodic tension-type headache, not intractable 10/13/2017    Kelli Mcpherson has a pregnancy with the complications mentioned in the problem list. During today's visit we focused on the following concerns:   The patient was sent to [place] to help complete the visualization of the fetal anatomic structures.  I discussed the limitations of ultrasound at this gestational age including suboptimal acoustic windows due to maternal body habitus, low fluid, fetal position or poor acoustic angles.  Today the fetal anatomy cannot be completed and I informed the patient that it is less likely as time goes on that we will complete all of the fetal anatomy given the difficulty of visualizing the structures with advancing gestational age.  She had a fetal echocardiogram at [facility] which showed normal heart structures but some remained suboptimal visualized.  I discussed the clinical management of her hypertension and diabetes.  She reports that she is following closely with her doctors making appropriate adjustments of her insulin as needed.  We discussed the complications associated with uncontrolled diabetes in pregnancy as well as the need for antenatal  testing and early delivery around 37 weeks.  She reports with the most recent adjustment of her insulin that the majority of her fasting blood sugars are now in the low 100s and her 2-hour postprandials are in the 130s.  She denies hypoglycemic episodes ever since decreasing her insulin.  I instructed her to continue to eat a low carbohydrate diet follow-up with her OB provider.  Today the fetus is large for gestational age which is likely due to the suboptimally controlled gestational diabetes.  I encouraged the patient to continue to strive for at least 75% of her glucose values to be at goal.  Also discussed delivery will likely occur around 37 weeks.  Sonographic findings Single intrauterine pregnancy at 28w 1d  Fetal cardiac activity:  Observed and appears normal. Presentation: Cephalic. The anatomic structures that were well seen appear normal without evidence of soft markers. Due to poor acoustic windows some structures remain suboptimally visualized. Fetal biometry shows the estimated fetal weight at the 93 percentile.  Amniotic fluid: Within normal limits.  MVP: 3.91 cm. Placenta: Fundal. Adnexa: No abnormality visualized. Cervical length:  cm.  There are limitations of prenatal ultrasound such as the inability to detect certain abnormalities due to poor visualization. Various factors such as fetal position, gestational age and maternal body habitus may increase the difficulty in visualizing the fetal anatomy.    Recommendations -EDD should be 07/16/2023 based on  LMP  (10/09/22). -Continue to follow with OB provider  -Adjust insulin as needed to achieve at least 75% of values at goal. -Serial growth ultrasounds every 4 to 6 weeks -Antenatal testing to start around 30 to 32 weeks -Delivery likely around 37  weeks -Continue routine prenatal care with referring OB provider *The patient desires her [facility]  Please refer back to [facility]  with any concerns.  Review of Systems: A review of systems was performed and was negative except per HPI   Vitals and Physical Exam    04/24/2023    9:54 AM 03/19/2023    4:13 AM 03/19/2023    1:19 AM  Vitals with BMI  Systolic 130 135 086  Diastolic 60 67 74  Pulse 85 99 104    Sitting comfortably on the sonogram table Nonlabored breathing Normal rate and rhythm Abdomen is nontender  Past pregnancies OB History  Gravida Para Term Preterm AB Living  3 2 1 1  2   SAB IAB Ectopic Multiple Live Births     0 2    # Outcome Date GA Lbr Len/2nd Weight Sex Type Anes PTL Lv  3 Current           2 Term 02/12/22 [redacted]w[redacted]d 04:00 / 00:03 8 lb 12 oz (3.969 kg) F Vag-Spont None  LIV  1 Preterm 06/09/19 [redacted]w[redacted]d  5 lb 9.2 oz (2.53 kg) M Vag-Spont EPI  LIV     Complications: Preeclampsia, third trimester     I spent 45 minutes reviewing the patients chart, including labs and images as well as counseling the patient about her medical conditions. Greater than 50% of the time was spent in direct face-to-face patient counseling.  Braxton Feathers, DO Maternal fetal medicine, [place]   04/24/2023  10:47 AM

## 2023-05-04 ENCOUNTER — Encounter: Payer: Self-pay | Admitting: "Endocrinology

## 2023-05-04 ENCOUNTER — Ambulatory Visit (INDEPENDENT_AMBULATORY_CARE_PROVIDER_SITE_OTHER): Admitting: "Endocrinology

## 2023-05-04 VITALS — BP 140/80 | HR 85 | Ht 71.0 in | Wt 316.0 lb

## 2023-05-04 DIAGNOSIS — Z794 Long term (current) use of insulin: Secondary | ICD-10-CM

## 2023-05-04 DIAGNOSIS — E119 Type 2 diabetes mellitus without complications: Secondary | ICD-10-CM | POA: Diagnosis not present

## 2023-05-04 MED ORDER — BAQSIMI ONE PACK 3 MG/DOSE NA POWD: 1.0000 | NASAL | 3 refills | Status: AC | PRN

## 2023-05-04 NOTE — Patient Instructions (Signed)
  Goals of DM therapy:  Morning Fasting blood sugar: 60-90  Blood sugar before meals: 90-120 Bed time blood sugar: 90-120 A1C <7%, limited only by hypoglycemia  1.Diabetes medications and their side effects discussed, including hypoglycemia    2. Check blood glucose:  a) Always check blood sugars before driving. Please see below (under hypoglycemia) on how to manage b) Check a minimum of 3 times/day or more as needed when having symptoms of hypoglycemia.   c) Try to check blood glucose before sleeping/in the middle of the night to ensure that it is remaining stable and not dropping less than 100 d) Check blood glucose more often if sick  3. Diet: a) 3 meals per day schedule b: Restrict carbs to 60-70 grams (4 servings) per meal c) Colorful vegetables - 3 servings a day, and low sugar fruit 2 servings/day Plate control method: 1/4 plate protein, 1/4 starch, 1/2 green, yellow, or red vegetables d) Avoid carbohydrate snacks unless hypoglycemic episode, or increased physical activity  4. Regular exercise as tolerated, preferably 3 or more hours a week  5. Hypoglycemia: a)  Do not drive or operate machinery without first testing blood glucose to assure it is over 90 mg%, or if dizzy, lightheaded, not feeling normal, etc, or  if foot or leg is numb or weak. b)  If blood glucose less than 70, take four 5gm Glucose tabs or 15-30 gm Glucose gel.  Repeat every 15 min as needed until blood sugar is >100 mg/dl. If hypoglycemia persists then call 911.   6. Sick day management: a) Check blood glucose more often b) Continue usual therapy if blood sugars are elevated.   7. Contact the doctor immediately if blood glucose is frequently <60 mg/dl, or an episode of severe hypoglycemia occurs (where someone had to give you glucose/  glucagon or if you passed out from a low blood glucose), or if blood glucose is persistently >350 mg/dl, for further management  8. A change in level of physical activity  or exercise and a change in diet may also affect your blood sugar. Check blood sugars more often and call if needed.  Instructions: 1. Bring glucose meter, blood glucose records on every visit for review 2. Continue to follow up with primary care physician and other providers for medical care 3. Yearly eye  and foot exam 4. Please get blood work done prior to the next appointment

## 2023-05-04 NOTE — Progress Notes (Signed)
 Outpatient Endocrinology Note Kelli Mcpherson [place], MD  05/04/23   Mauri Brooklyn August 10, 1995 [id number]  Referring Provider: Ranae Pila, * Primary Care Provider: Ranae Pila, MD Reason for consultation: Subjective   Assessment & Plan  Diagnoses and all orders for this visit:  Controlled type 2 diabetes mellitus without complication, with long-term current use of insulin (HCC)  Other orders -     Glucagon (BAQSIMI ONE PACK) 3 MG/DOSE POWD; Place 1 Device into the nose as needed (Low blood sugar with impaired consciousness).   Pre-diabetes leading to gestational diabetes with no known complications , No results found for: "GFR" Hba1c goal less than 7, current Hba1c is  Lab Results  Component Value Date   HGBA1C 6.2 (H) 01/09/2023   Will recommend the following: Humalog 5 units before snacks, and 6 units before meals, 15 min before eating Lantus 36 units qpm and 10 units qpm Given log to write down BG - discussed pregnancy goals  Has dexcom  Patient is [redacted] weeks pregnant with her third child.  No known contraindications/side effects to any of above medications Baqsimi discussed and prescribed with refills on 05/04/23  -Last LD and Tg are as follows: Lab Results  Component Value Date   LDLCALC 92 01/09/2023    Lab Results  Component Value Date   TRIG 156 (H) 01/09/2023   -not on statin  -Follow low fat diet and exercise   -Blood pressure goal <140/90 - Microalbumin/creatinine goal is < 30 -Last MA/Cr is as follows: Lab Results  Component Value Date   MICROALBUR 1.3 01/09/2023   -not on ACE/ARB  -diet changes including salt restriction -limit eating outside -counseled BP targets per standards of diabetes care -uncontrolled blood pressure can lead to retinopathy, nephropathy and cardiovascular and atherosclerotic heart disease  Reviewed and counseled on: -A1C target -Blood sugar targets -Complications of uncontrolled diabetes   -Checking blood sugar before meals and bedtime and bring log next visit -All medications with mechanism of action and side effects -Hypoglycemia management: rule of 15's, Glucagon Emergency Kit and medical alert ID -low-carb low-fat plate-method diet -At least 20 minutes of physical activity per day -Annual dilated retinal eye exam and foot exam -compliance and follow up needs -follow up as scheduled or earlier if problem gets worse  Call if blood sugar is less than 70 or consistently above 250    Take a 15 gm snack of carbohydrate at bedtime before you go to sleep if your blood sugar is less than 100.    If you are going to fast after midnight for a test or procedure, ask your physician for instructions on how to reduce/decrease your insulin dose.    Call if blood sugar is less than 70 or consistently above 250  -Treating a low sugar by rule of 15  (15 gms of sugar every 15 min until sugar is more than 70) If you feel your sugar is low, test your sugar to be sure If your sugar is low (less than 70), then take 15 grams of a fast acting Carbohydrate (3-4 glucose tablets or glucose gel or 4 ounces of juice or regular soda) Recheck your sugar 15 min after treating low to make sure it is more than 70 If sugar is still less than 70, treat again with 15 grams of carbohydrate          Don't drive the hour of hypoglycemia  If unconscious/unable to eat or drink by mouth, use glucagon injection or  nasal spray baqsimi and call 911. Can repeat again in 15 min if still unconscious.  Return in about 3 weeks (around 05/25/2023).   I have reviewed current medications, nurse's notes, allergies, vital signs, past medical and surgical history, family medical history, and social history for this encounter. Counseled patient on symptoms, examination findings, lab findings, imaging results, treatment decisions and monitoring and prognosis. The patient understood the recommendations and agrees with the  treatment plan. All questions regarding treatment plan were fully answered.  Kelli Mcpherson , MD  05/04/23    History of Present Illness Kelli Mcpherson is a 28 y.o. year old female who presents for evaluation of pre-diabetes in 2020 leading to gestational diabetes.  Diabetes education +  Home diabetes regimen: Humalog 5 units 6 times a day, 10 min before eating Lantus 36 units qpm and 10 units qpm   COMPLICATIONS -  MI/Stroke -  retinopathy -  neuropathy -  nephropathy  SYMPTOMS REVIEWED - Polyuria - Weight loss - Blurred vision  BLOOD SUGAR DATA  CGM interpretation: At today's visit, we reviewed her CGM downloads. The full report is scanned in the media. Reviewing the CGM trends, BG are mildly elevated after meals.  Physical Exam  BP (!) 140/80   Pulse 85   Ht 5\' 11"  (1.803 m)   Wt (!) 316 lb (143.3 kg)   LMP 10/09/2022 (Exact Date)   SpO2 98%   BMI 44.07 kg/m    Constitutional: well developed, well nourished Head: normocephalic, atraumatic Eyes: sclera anicteric, no redness Neck: supple Lungs: normal respiratory effort Neurology: alert and oriented Skin: dry, no appreciable rashes Musculoskeletal: no appreciable defects Psychiatric: normal mood and affect Diabetic Foot Exam - Simple   No data filed      Current Medications Patient's Medications  New Prescriptions   GLUCAGON (BAQSIMI ONE PACK) 3 MG/DOSE POWD    Place 1 Device into the nose as needed (Low blood sugar with impaired consciousness).  Previous Medications   ASPIRIN EC 81 MG TABLET    Take 81 mg by mouth 2 (two) times daily. Swallow whole.   CONTINUOUS BLOOD GLUC RECEIVER (DEXCOM G7 RECEIVER) DEVI    Use to monitor BG continuously   CONTINUOUS BLOOD GLUC SENSOR (DEXCOM G7 SENSOR) MISC    CHANGE SENSOR EVERY 10 DAYS   INSULIN GLARGINE (LANTUS) 100 UNIT/ML INJECTION    Inject 10 Units into the skin in the morning. 36 u at HS   INSULIN LISPRO (HUMALOG) 100 UNIT/ML INJECTION    Inject 5 Units  into the skin 3 (three) times daily before meals. 1 unit for every 10 calories   INSULIN SYRINGE-NEEDLE U-100 29G X 1/2" 0.3 ML MISC    Use 4 times per day to administer insulin   LABETALOL (NORMODYNE) 200 MG TABLET    Take 1 tablet (200 mg total) by mouth 2 (two) times daily.   ONDANSETRON (ZOFRAN-ODT) 4 MG DISINTEGRATING TABLET    Take 1 tablet (4 mg total) by mouth every 8 (eight) hours as needed for nausea or vomiting.   PRENATAL MULTIVIT-MIN-FA ([employer] PRENATAL GUMMY) 0.4 MG CHEW    Chew 1 Units by mouth daily.   PROMETHAZINE (PHENERGAN) 12.5 MG TABLET    Take 1 tablet (12.5 mg total) by mouth every 6 (six) hours as needed for nausea or vomiting.   SCOPOLAMINE (TRANSDERM-SCOP) 1 MG/3DAYS    Place 1 patch (1.5 mg total) onto the skin every 3 (three) days.   VALACYCLOVIR (VALTREX) 500 MG TABLET  Take 1 tablet (500 mg total) by mouth 2 (two) times daily. Take for 3 days with onset of outbreak  Modified Medications   No medications on file  Discontinued Medications   No medications on file    Allergies No Known Allergies  Past Medical History Past Medical History:  Diagnosis Date   Chlamydia    COVID-19    DKA (diabetic ketoacidosis) (HCC) 05/29/2019   Eczema    Essential hypertension, benign 09/30/2018   Genital herpes    Gonorrhea    Pre-eclampsia 06/08/2019   Preeclampsia    Type 2 diabetes mellitus (HCC)    UTI (urinary tract infection)    Vaginal Pap smear, abnormal     Past Surgical History Past Surgical History:  Procedure Laterality Date   WISDOM TOOTH EXTRACTION      Family History family history includes Diabetes in her mother; Hypertension in her father and mother.  Social History Social History   Socioeconomic History   Marital status: Single    Spouse name: Not on file   Number of children: Not on file   Years of education: Not on file   Highest education level: Not on file  Occupational History   Not on file  Tobacco Use   Smoking status: Never    Smokeless tobacco: Never   Tobacco comments:    Patient occasionally smokes hookah   Vaping Use   Vaping status: Former   Substances: Nicotine, Flavoring  Substance and Sexual Activity   Alcohol use: Not Currently    Comment: socially   Drug use: Never   Sexual activity: Not Currently    Birth control/protection: None  Other Topics Concern   Not on file  Social History Narrative   Not on file   Social Drivers of Health   Financial Resource Strain: Not on file  Food Insecurity: No Food Insecurity (02/11/2022)   Hunger Vital Sign    Worried About Running Out of Food in the Last Year: Never true    Ran Out of Food in the Last Year: Never true  Transportation Needs: No Transportation Needs (02/11/2022)   PRAPARE - Administrator, Civil Service (Medical): No    Lack of Transportation (Non-Medical): No  Physical Activity: Not on file  Stress: Not on file  Social Connections: Unknown (06/03/2021)   Received [facility]   Social Network    Social Network: Not on file  Intimate Partner Violence: Not At Risk (02/11/2022)   Humiliation, Afraid, Rape, and Kick questionnaire    Fear of Current or Ex-Partner: No    Emotionally Abused: No    Physically Abused: No    Sexually Abused: No    Lab Results  Component Value Date   HGBA1C 6.2 (H) 01/09/2023   HGBA1C 10.1 (H) 07/03/2021   HGBA1C 9.9 (H) 09/06/2020   Lab Results  Component Value Date   CHOL 172 01/09/2023   Lab Results  Component Value Date   HDL 55 01/09/2023   Lab Results  Component Value Date   LDLCALC 92 01/09/2023   Lab Results  Component Value Date   TRIG 156 (H) 01/09/2023   Lab Results  Component Value Date   CHOLHDL 3.1 01/09/2023   Lab Results  Component Value Date   CREATININE 0.64 03/18/2023   No results found for: "GFR" Lab Results  Component Value Date   MICROALBUR 1.3 01/09/2023      Component Value Date/Time   NA 138 03/18/2023 2343  K 3.9 03/18/2023  2343   CL 106 03/18/2023 2343   CO2 21 (L) 03/18/2023 2343   GLUCOSE 101 (H) 03/18/2023 2343   BUN 7 03/18/2023 2343   CREATININE 0.64 03/18/2023 2343   CREATININE 0.45 (L) 01/09/2023 1107   CALCIUM 9.4 03/18/2023 2343   PROT 7.3 03/18/2023 2343   ALBUMIN 3.5 03/18/2023 2343   AST 13 (L) 03/18/2023 2343   ALT 9 03/18/2023 2343   ALKPHOS 54 03/18/2023 2343   BILITOT 0.4 03/18/2023 2343   GFRNONAA >60 03/18/2023 2343   GFRAA >60 06/20/2019 1134      Latest Ref Rng & Units 03/18/2023   11:43 PM 01/09/2023   11:07 AM 02/17/2022   11:50 AM  BMP  Glucose 70 - 99 mg/dL 161  096  045   BUN 6 - 20 mg/dL 7  7  6    Creatinine 0.44 - 1.00 mg/dL 4.09  8.11  9.14   BUN/Creat Ratio 6 - 22 (calc)  16    Sodium 135 - 145 mmol/L 138  136  138   Potassium 3.5 - 5.1 mmol/L 3.9  3.8  4.1   Chloride 98 - 111 mmol/L 106  104  106   CO2 22 - 32 mmol/L 21  20  25    Calcium 8.9 - 10.3 mg/dL 9.4  9.3  8.6        Component Value Date/Time   WBC 10.8 (H) 03/18/2023 2343   RBC 4.32 03/18/2023 2343   HGB 12.6 03/18/2023 2343   HCT 38.8 03/18/2023 2343   PLT 213 03/18/2023 2343   MCV 89.8 03/18/2023 2343   MCH 29.2 03/18/2023 2343   MCHC 32.5 03/18/2023 2343   RDW 12.7 03/18/2023 2343   LYMPHSABS 1.3 02/17/2022 1150   MONOABS 0.5 02/17/2022 1150   EOSABS 40 01/09/2023 1107   BASOSABS 10 01/09/2023 1107     Parts of this note may have been dictated using voice recognition software. There may be variances in spelling and vocabulary which are unintentional. Not all errors are proofread. Please notify the [facility] if any discrepancies are noted or if the meaning of any statement is not clear.

## 2023-05-11 DIAGNOSIS — N760 Acute vaginitis: Secondary | ICD-10-CM | POA: Diagnosis not present

## 2023-05-11 DIAGNOSIS — Z3483 Encounter for supervision of other normal pregnancy, third trimester: Secondary | ICD-10-CM | POA: Diagnosis not present

## 2023-05-11 DIAGNOSIS — O99891 Other specified diseases and conditions complicating pregnancy: Secondary | ICD-10-CM | POA: Diagnosis not present

## 2023-05-11 DIAGNOSIS — Z3A30 30 weeks gestation of pregnancy: Secondary | ICD-10-CM | POA: Diagnosis not present

## 2023-05-13 ENCOUNTER — Encounter: Payer: Self-pay | Admitting: "Endocrinology

## 2023-05-26 ENCOUNTER — Inpatient Hospital Stay (HOSPITAL_COMMUNITY)
Admission: AD | Admit: 2023-05-26 | Discharge: 2023-05-26 | Disposition: A | Discharge status: Home / Self Care | Attending: Obstetrics and Gynecology | Admitting: Obstetrics and Gynecology

## 2023-05-26 ENCOUNTER — Encounter (HOSPITAL_COMMUNITY): Payer: Self-pay | Admitting: Obstetrics and Gynecology

## 2023-05-26 DIAGNOSIS — Z794 Long term (current) use of insulin: Secondary | ICD-10-CM | POA: Diagnosis not present

## 2023-05-26 DIAGNOSIS — O09293 Supervision of pregnancy with other poor reproductive or obstetric history, third trimester: Secondary | ICD-10-CM | POA: Diagnosis not present

## 2023-05-26 DIAGNOSIS — F17200 Nicotine dependence, unspecified, uncomplicated: Secondary | ICD-10-CM | POA: Insufficient documentation

## 2023-05-26 DIAGNOSIS — O99333 Smoking (tobacco) complicating pregnancy, third trimester: Secondary | ICD-10-CM | POA: Insufficient documentation

## 2023-05-26 DIAGNOSIS — Z3A32 32 weeks gestation of pregnancy: Secondary | ICD-10-CM | POA: Diagnosis not present

## 2023-05-26 DIAGNOSIS — O24113 Pre-existing diabetes mellitus, type 2, in pregnancy, third trimester: Secondary | ICD-10-CM | POA: Insufficient documentation

## 2023-05-26 DIAGNOSIS — O368130 Decreased fetal movements, third trimester, not applicable or unspecified: Secondary | ICD-10-CM | POA: Insufficient documentation

## 2023-05-26 DIAGNOSIS — O10913 Unspecified pre-existing hypertension complicating pregnancy, third trimester: Secondary | ICD-10-CM | POA: Diagnosis not present

## 2023-05-26 DIAGNOSIS — O368190 Decreased fetal movements, unspecified trimester, not applicable or unspecified: Secondary | ICD-10-CM

## 2023-05-26 LAB — URINALYSIS, ROUTINE W REFLEX MICROSCOPIC
Bilirubin Urine: NEGATIVE
Glucose, UA: >=500 mg/dL — AB
Hgb urine dipstick: NEGATIVE
Ketones, ur: 20 mg/dL — AB
Leukocytes,Ua: NEGATIVE
Nitrite: NEGATIVE
Protein, ur: 30 mg/dL — AB
Specific Gravity, Urine: 1.026 (ref 1.005–1.030)
pH: 5 (ref 5.0–8.0)

## 2023-05-26 LAB — WET PREP, GENITAL
Clue Cells Wet Prep HPF POC: NONE SEEN
Trich, Wet Prep: NONE SEEN
WBC, Wet Prep HPF POC: >=10 — AB (ref <10)
Yeast Wet Prep HPF POC: NONE SEEN

## 2023-05-26 LAB — POCT FERN TEST: POCT Fern Test: NEGATIVE

## 2023-05-26 NOTE — MAU Note (Addendum)
 MAU Triage Note:  .TASHAYLA GRODEN is a 28 y.o. at [redacted]w[redacted]d here in MAU reporting: has not felt FM since last night. She also reports a small amount of bright red blood with wiping - denies pad use. Last IC was last night. Denies abnormal discharge.  Patient complaint: DFM,BLEEDING  Pain Score: 0-No pain    PIH Assessment: Headache present: No  Visual disturbances: None RUQ pain/Epigastric: None Atypical edema: None BP Medications: Labetalol  400 mg BID, last dose taken last night   Onset of complaint: yesterday LMP: Patient's last menstrual period was 10/09/2022 (exact date).  Vitals:   05/26/23 1223  BP: (!) 147/71  Pulse: (!) 102  Resp: 16  Temp: 98.2 F (36.8 C)  SpO2: 98%    FHT:  Fetal Heart Rate Mode: Doppler Baseline Rate (A): 167 bpm Multiple birth?: No Lab orders placed from triage: UA

## 2023-05-26 NOTE — MAU Provider Note (Addendum)
 History     CSN: [id number]  Arrival date and time: 05/26/23 1208   None     Chief Complaint  Patient presents with   Vaginal Bleeding   Decreased Fetal Movement   HPI  28 yo g3p1102 @ 32+5 hx t2dm on insulin  and chtn on labetalol  presenting with decreased fetal movement that started this morning as well as spotting when wiped after urinating today, no bleeding since. Had one episode of slight pain with urination this morning but this also resolved, normal urination since. Did have intercourse last night. Has had discharge for months but that is unchanged.   Past Medical History:  Diagnosis Date   Chlamydia    COVID-19    DKA (diabetic ketoacidosis) (HCC) 05/29/2019   Eczema    Essential hypertension, benign 09/30/2018   Genital herpes    Gonorrhea    Pre-eclampsia 06/08/2019   Preeclampsia    Type 2 diabetes mellitus (HCC)    UTI (urinary tract infection)    Vaginal Pap smear, abnormal     Past Surgical History:  Procedure Laterality Date   WISDOM TOOTH EXTRACTION      Family History  Problem Relation Age of Onset   Diabetes Mother    Hypertension Mother    Hypertension Father    Asthma Neg Hx    Cancer Neg Hx    Birth defects Neg Hx    Heart disease Neg Hx    Stroke Neg Hx     Social History   Tobacco Use   Smoking status: Never   Smokeless tobacco: Never   Tobacco comments:    Patient occasionally smokes hookah   Vaping Use   Vaping status: Former   Substances: Nicotine, Flavoring  Substance Use Topics   Alcohol use: Not Currently    Comment: socially   Drug use: Never    Allergies: No Known Allergies  Medications Prior to Admission  Medication Sig Dispense Refill Last Dose/Taking   aspirin EC 81 MG tablet Take 81 mg by mouth 2 (two) times daily. Swallow whole.   05/25/2023   insulin  glargine (LANTUS ) 100 UNIT/ML injection Inject 10 Units into the skin in the morning. 36 u at [facility]   05/25/2023   insulin  lispro (HUMALOG ) 100 UNIT/ML injection  Inject 5 Units into the skin 3 (three) times daily before meals. 1 unit for every 10 calories   05/25/2023   labetalol  (NORMODYNE ) 200 MG tablet Take 1 tablet (200 mg total) by mouth 2 (two) times daily. (Patient taking differently: Take 400 mg by mouth 2 (two) times daily.) 60 tablet 3 05/25/2023   Prenatal Multivit-Min-FA ([employer] PRENATAL GUMMY) 0.4 MG CHEW Chew 1 Units by mouth daily. 30 tablet 10 05/26/2023   Continuous Blood Gluc Receiver (DEXCOM G7 RECEIVER) DEVI Use to monitor BG continuously 1 each 0    Continuous Blood Gluc Sensor (DEXCOM G7 SENSOR) MISC CHANGE SENSOR EVERY 10 DAYS 3 each 2    Glucagon  (BAQSIMI  ONE PACK) 3 MG/DOSE POWD Place 1 Device into the nose as needed (Low blood sugar with impaired consciousness). 2 each 3    Insulin  Syringe-Needle U-100 29G X 1/2" 0.3 ML MISC Use 4 times per day to administer insulin  200 each 0    ondansetron  (ZOFRAN -ODT) 4 MG disintegrating tablet Take 1 tablet (4 mg total) by mouth every 8 (eight) hours as needed for nausea or vomiting. (Patient not taking: Reported on 04/24/2023) 30 tablet 0 More than a month   promethazine  (PHENERGAN ) 12.5 MG tablet Take  1 tablet (12.5 mg total) by mouth every 6 (six) hours as needed for nausea or vomiting. (Patient not taking: Reported on 04/24/2023) 30 tablet 0 More than a month   scopolamine  (TRANSDERM-SCOP) 1 MG/3DAYS Place 1 patch (1.5 mg total) onto the skin every 3 (three) days. (Patient not taking: Reported on 04/24/2023) 1 patch 0 More than a month   valACYclovir  (VALTREX ) 500 MG tablet Take 1 tablet (500 mg total) by mouth 2 (two) times daily. Take for 3 days with onset of outbreak 6 tablet 10 More than a month    Review of Systems Physical Exam   Blood pressure (!) 147/71, pulse (!) 102, temperature 98.2 F (36.8 C), temperature source Oral, resp. rate 16, height 5\' 11"  (1.803 m), weight (!) 144.5 kg, last menstrual period 10/09/2022, SpO2 98%, currently breastfeeding.  Physical Exam NAD RRR Abdomen soft,  gravid, uterus not tender GU: normal vulva and vagina, normal cervix appears closed, thin tan discharge. Non-thrombosed external hemorrhoid  145/mod/+a/-d  Assessment and Plan   # Decreased fetal movement Resolved after presenting here, reports fetal movement is back to normal. Has discharge but fern neg and amount of discharge is stable for the past several months, wet prep negative. NST reactive for gestational age. Return precautions reviewed  # Vaginal spotting One episode this morning. Also with an episode of dysuria that has resolved. Normal external genitalia, normal speculum exam no bleeding seen. Fundal placenta on most recent u/s. Rh positive. Urinalysis not suggestive of infection. Did have intercourse last night, possibly slight cervical trauma from that. Hx hsv but no lesions seen, no prodromal symptoms. Discharged home with appropriate return precautions, has ob f/u in 2 days.  Albina Hull Myna Freimark 05/26/2023, 1:34 PM

## 2023-05-27 LAB — GC/CHLAMYDIA PROBE AMP (~~LOC~~) NOT AT ARMC
Chlamydia: NEGATIVE
Comment: NEGATIVE
Comment: NORMAL
Neisseria Gonorrhea: NEGATIVE

## 2023-05-28 ENCOUNTER — Ambulatory Visit: Admitting: "Endocrinology

## 2023-05-28 DIAGNOSIS — O24113 Pre-existing diabetes mellitus, type 2, in pregnancy, third trimester: Secondary | ICD-10-CM | POA: Diagnosis not present

## 2023-05-28 DIAGNOSIS — Z3A33 33 weeks gestation of pregnancy: Secondary | ICD-10-CM | POA: Diagnosis not present

## 2023-05-28 DIAGNOSIS — Z3483 Encounter for supervision of other normal pregnancy, third trimester: Secondary | ICD-10-CM | POA: Diagnosis not present

## 2023-05-28 DIAGNOSIS — O99213 Obesity complicating pregnancy, third trimester: Secondary | ICD-10-CM | POA: Diagnosis not present

## 2023-05-29 DIAGNOSIS — O0990 Supervision of high risk pregnancy, unspecified, unspecified trimester: Secondary | ICD-10-CM | POA: Diagnosis not present

## 2023-05-29 DIAGNOSIS — Z8759 Personal history of other complications of pregnancy, childbirth and the puerperium: Secondary | ICD-10-CM | POA: Diagnosis not present

## 2023-05-29 DIAGNOSIS — E119 Type 2 diabetes mellitus without complications: Secondary | ICD-10-CM | POA: Diagnosis not present

## 2023-05-29 DIAGNOSIS — I10 Essential (primary) hypertension: Secondary | ICD-10-CM | POA: Diagnosis not present

## 2023-05-29 DIAGNOSIS — Z8751 Personal history of pre-term labor: Secondary | ICD-10-CM | POA: Diagnosis not present

## 2023-06-03 DIAGNOSIS — E119 Type 2 diabetes mellitus without complications: Secondary | ICD-10-CM | POA: Diagnosis not present

## 2023-06-03 DIAGNOSIS — Z8751 Personal history of pre-term labor: Secondary | ICD-10-CM | POA: Diagnosis not present

## 2023-06-03 DIAGNOSIS — Z8759 Personal history of other complications of pregnancy, childbirth and the puerperium: Secondary | ICD-10-CM | POA: Diagnosis not present

## 2023-06-03 DIAGNOSIS — I10 Essential (primary) hypertension: Secondary | ICD-10-CM | POA: Diagnosis not present

## 2023-06-03 DIAGNOSIS — O0990 Supervision of high risk pregnancy, unspecified, unspecified trimester: Secondary | ICD-10-CM | POA: Diagnosis not present

## 2023-06-05 DIAGNOSIS — Z3400 Encounter for supervision of normal first pregnancy, unspecified trimester: Secondary | ICD-10-CM | POA: Diagnosis not present

## 2023-06-05 DIAGNOSIS — O99891 Other specified diseases and conditions complicating pregnancy: Secondary | ICD-10-CM | POA: Diagnosis not present

## 2023-06-05 DIAGNOSIS — Z3A34 34 weeks gestation of pregnancy: Secondary | ICD-10-CM | POA: Diagnosis not present

## 2023-06-09 DIAGNOSIS — O99891 Other specified diseases and conditions complicating pregnancy: Secondary | ICD-10-CM | POA: Diagnosis not present

## 2023-06-09 DIAGNOSIS — Z3A34 34 weeks gestation of pregnancy: Secondary | ICD-10-CM | POA: Diagnosis not present

## 2023-06-09 DIAGNOSIS — Z3400 Encounter for supervision of normal first pregnancy, unspecified trimester: Secondary | ICD-10-CM | POA: Diagnosis not present

## 2023-06-10 DIAGNOSIS — E119 Type 2 diabetes mellitus without complications: Secondary | ICD-10-CM | POA: Diagnosis not present

## 2023-06-10 DIAGNOSIS — Z8751 Personal history of pre-term labor: Secondary | ICD-10-CM | POA: Diagnosis not present

## 2023-06-10 DIAGNOSIS — O0990 Supervision of high risk pregnancy, unspecified, unspecified trimester: Secondary | ICD-10-CM | POA: Diagnosis not present

## 2023-06-10 DIAGNOSIS — Z8759 Personal history of other complications of pregnancy, childbirth and the puerperium: Secondary | ICD-10-CM | POA: Diagnosis not present

## 2023-06-10 DIAGNOSIS — I10 Essential (primary) hypertension: Secondary | ICD-10-CM | POA: Diagnosis not present

## 2023-06-12 LAB — OB RESULTS CONSOLE GBS: GBS: POSITIVE

## 2023-06-19 DIAGNOSIS — O24113 Pre-existing diabetes mellitus, type 2, in pregnancy, third trimester: Secondary | ICD-10-CM | POA: Diagnosis not present

## 2023-06-19 DIAGNOSIS — Z8759 Personal history of other complications of pregnancy, childbirth and the puerperium: Secondary | ICD-10-CM | POA: Diagnosis not present

## 2023-06-19 DIAGNOSIS — O10013 Pre-existing essential hypertension complicating pregnancy, third trimester: Secondary | ICD-10-CM | POA: Diagnosis not present

## 2023-06-19 DIAGNOSIS — Z8751 Personal history of pre-term labor: Secondary | ICD-10-CM | POA: Diagnosis not present

## 2023-06-19 DIAGNOSIS — Z3A36 36 weeks gestation of pregnancy: Secondary | ICD-10-CM | POA: Diagnosis not present

## 2023-06-19 DIAGNOSIS — I10 Essential (primary) hypertension: Secondary | ICD-10-CM | POA: Diagnosis not present

## 2023-06-19 DIAGNOSIS — Z3483 Encounter for supervision of other normal pregnancy, third trimester: Secondary | ICD-10-CM | POA: Diagnosis not present

## 2023-06-19 DIAGNOSIS — O0990 Supervision of high risk pregnancy, unspecified, unspecified trimester: Secondary | ICD-10-CM | POA: Diagnosis not present

## 2023-06-19 DIAGNOSIS — O99213 Obesity complicating pregnancy, third trimester: Secondary | ICD-10-CM | POA: Diagnosis not present

## 2023-06-19 DIAGNOSIS — E119 Type 2 diabetes mellitus without complications: Secondary | ICD-10-CM | POA: Diagnosis not present

## 2023-06-23 ENCOUNTER — Telehealth (HOSPITAL_COMMUNITY): Payer: Self-pay | Admitting: *Deleted

## 2023-06-23 DIAGNOSIS — O10013 Pre-existing essential hypertension complicating pregnancy, third trimester: Secondary | ICD-10-CM | POA: Diagnosis not present

## 2023-06-23 DIAGNOSIS — O99213 Obesity complicating pregnancy, third trimester: Secondary | ICD-10-CM | POA: Diagnosis not present

## 2023-06-23 DIAGNOSIS — O24113 Pre-existing diabetes mellitus, type 2, in pregnancy, third trimester: Secondary | ICD-10-CM | POA: Diagnosis not present

## 2023-06-23 DIAGNOSIS — Z3400 Encounter for supervision of normal first pregnancy, unspecified trimester: Secondary | ICD-10-CM | POA: Diagnosis not present

## 2023-06-23 DIAGNOSIS — Z3A36 36 weeks gestation of pregnancy: Secondary | ICD-10-CM | POA: Diagnosis not present

## 2023-06-23 NOTE — Telephone Encounter (Signed)
 Preadmission screen

## 2023-06-24 ENCOUNTER — Encounter (HOSPITAL_COMMUNITY): Payer: Self-pay | Admitting: *Deleted

## 2023-06-24 NOTE — H&P (Signed)
 OB History and Physical Preadmission H&P for scheduled induction of labor.  Kelli Mcpherson is a 28 y.o. female [phone] presenting for induction of labor at [redacted]w[redacted]d for chronic hypertension and T2DM.  She has been followed by MFM for T2DM, currently using Lantus  46u at bedtime and 15 u QAM, as well as Humalog  6u with breakfast and 8 u with lunch and dinner. She was instructed to half her Lantus  dose the evening prior to induction (23u), and hold AM dose on day of induction.   She has a history of HSV, taking valtrex .  Rh positive, GBS positive (06/12/23), panorama low risk.   EFW has been intermittently LGA, most recent US  at [redacted]w[redacted]d (06/23/23) EFW 3348 (85%ile), vertex, AFI 16.1 cm.    OB History     Gravida  3   Para  2   Term  1   Preterm  1   AB      Living  2      SAB      IAB      Ectopic      Multiple  0   Live Births  2          Past Medical History:  Diagnosis Date   Chlamydia    COVID-19    DKA (diabetic ketoacidosis) (HCC) 05/29/2019   Eczema    Essential hypertension, benign 09/30/2018   Genital herpes    Gonorrhea    Pre-eclampsia 06/08/2019   Preeclampsia    Type 2 diabetes mellitus (HCC)    UTI (urinary tract infection)    Vaginal Pap smear, abnormal    Past Surgical History:  Procedure Laterality Date   WISDOM TOOTH EXTRACTION     Family History: family history includes Diabetes in her mother; Hypertension in her father and mother. Social History:  reports that she has never smoked. She has never used smokeless tobacco. She reports that she does not currently use alcohol. She reports that she does not use drugs.     Maternal Diabetes: T2DM on insulin  Genetic Screening: Normal Maternal Ultrasounds/Referrals: Normal Fetal Ultrasounds or other Referrals:  Fetal echo, Referred to Materal Fetal Medicine  Maternal Substance Abuse:  No Significant Maternal Medications:  Insulin , labetalol  Significant Maternal Lab Results:  Group B Strep  positive Other Comments:  None  Review of Systems - Patient denies fever, chills, SOB, CP, N/V/D.  History   Last menstrual period 10/09/2022, currently breastfeeding. Exam Physical Exam   Gen: alert, well appearing, no distress Chest: nonlabored breathing CV: no peripheral edema Abdomen: soft, gravid  Ext: no evidence of DVT  Prenatal labs: ABO, Rh:   Antibody:   Rubella: Immune (12/04 0000) RPR: Nonreactive (12/04 0000)  HBsAg: Negative (12/04 0000)  HIV: Non-reactive (12/04 0000)  GBS: Positive/-- (05/23 0000)   Assessment/Plan: Admit to Labor and Delivery PCN for GBS PPx Cytotec  for cervical ripening, followed by pitocin , AROM Epidural if/when desired She was instructed to half her Lantus  dose the evening prior to induction (23u), and hold AM dose on day of induction. Will monitor and correct as needed Postpartum BG monitoring per MFM: adjust targets to 70-180, if outside range will restart lantus  at 23u.  Gretchen Leavell 06/24/2023, 6:17 AM

## 2023-06-25 ENCOUNTER — Encounter (HOSPITAL_COMMUNITY): Payer: Self-pay | Admitting: Obstetrics and Gynecology

## 2023-06-25 ENCOUNTER — Other Ambulatory Visit (HOSPITAL_COMMUNITY): Payer: Self-pay | Admitting: Emergency Medicine

## 2023-06-25 ENCOUNTER — Inpatient Hospital Stay (HOSPITAL_COMMUNITY)

## 2023-06-25 ENCOUNTER — Other Ambulatory Visit: Payer: Self-pay

## 2023-06-25 ENCOUNTER — Inpatient Hospital Stay (HOSPITAL_COMMUNITY)
Admission: RE | Admit: 2023-06-25 | Discharge: 2023-06-27 | DRG: 805 | Disposition: A | Discharge status: Home / Self Care | Source: Ambulatory Visit | Attending: Obstetrics and Gynecology | Admitting: Obstetrics and Gynecology

## 2023-06-25 DIAGNOSIS — A6000 Herpesviral infection of urogenital system, unspecified: Secondary | ICD-10-CM | POA: Diagnosis present

## 2023-06-25 DIAGNOSIS — Z833 Family history of diabetes mellitus: Secondary | ICD-10-CM | POA: Diagnosis not present

## 2023-06-25 DIAGNOSIS — Z794 Long term (current) use of insulin: Secondary | ICD-10-CM | POA: Diagnosis not present

## 2023-06-25 DIAGNOSIS — Z8616 Personal history of COVID-19: Secondary | ICD-10-CM

## 2023-06-25 DIAGNOSIS — O2412 Pre-existing diabetes mellitus, type 2, in childbirth: Secondary | ICD-10-CM | POA: Diagnosis present

## 2023-06-25 DIAGNOSIS — O9832 Other infections with a predominantly sexual mode of transmission complicating childbirth: Secondary | ICD-10-CM | POA: Diagnosis present

## 2023-06-25 DIAGNOSIS — E119 Type 2 diabetes mellitus without complications: Secondary | ICD-10-CM | POA: Diagnosis present

## 2023-06-25 DIAGNOSIS — O99824 Streptococcus B carrier state complicating childbirth: Secondary | ICD-10-CM | POA: Diagnosis present

## 2023-06-25 DIAGNOSIS — Z3A37 37 weeks gestation of pregnancy: Secondary | ICD-10-CM | POA: Diagnosis not present

## 2023-06-25 DIAGNOSIS — O169 Unspecified maternal hypertension, unspecified trimester: Principal | ICD-10-CM | POA: Diagnosis present

## 2023-06-25 DIAGNOSIS — O1092 Unspecified pre-existing hypertension complicating childbirth: Secondary | ICD-10-CM | POA: Diagnosis present

## 2023-06-25 DIAGNOSIS — Z8249 Family history of ischemic heart disease and other diseases of the circulatory system: Secondary | ICD-10-CM | POA: Diagnosis not present

## 2023-06-25 LAB — GLUCOSE, CAPILLARY
Glucose-Capillary: 86 mg/dL (ref 70–99)
Glucose-Capillary: 88 mg/dL (ref 70–99)
Glucose-Capillary: 57 mg/dL — ABNORMAL LOW (ref 70–99)
Glucose-Capillary: 76 mg/dL (ref 70–99)

## 2023-06-25 LAB — HIV ANTIBODY (ROUTINE TESTING W REFLEX): HIV Screen 4th Generation wRfx: NONREACTIVE

## 2023-06-25 LAB — CBC
HCT: 33 % — ABNORMAL LOW (ref 36.0–46.0)
Hemoglobin: 11 g/dL — ABNORMAL LOW (ref 12.0–15.0)
MCH: 29.3 pg (ref 26.0–34.0)
MCHC: 33.3 g/dL (ref 30.0–36.0)
MCV: 87.8 fL (ref 80.0–100.0)
Platelets: 184 10*3/uL (ref 150–400)
RBC: 3.76 MIL/uL — ABNORMAL LOW (ref 3.87–5.11)
RDW: 13.9 % (ref 11.5–15.5)
WBC: 7.5 10*3/uL (ref 4.0–10.5)
nRBC: 0 % (ref 0.0–0.2)

## 2023-06-25 LAB — COMPREHENSIVE METABOLIC PANEL WITH GFR
ALT: 11 U/L (ref 0–44)
AST: 14 U/L — ABNORMAL LOW (ref 15–41)
Albumin: 3 g/dL — ABNORMAL LOW (ref 3.5–5.0)
Alkaline Phosphatase: 116 U/L (ref 38–126)
Anion gap: 13 (ref 5–15)
BUN: 7 mg/dL (ref 6–20)
CO2: 20 mmol/L — ABNORMAL LOW (ref 22–32)
Calcium: 9.5 mg/dL (ref 8.9–10.3)
Chloride: 103 mmol/L (ref 98–111)
Creatinine, Ser: 0.47 mg/dL (ref 0.44–1.00)
GFR, Estimated: >60 mL/min (ref >60)
Glucose, Bld: 78 mg/dL (ref 70–99)
Potassium: 3.3 mmol/L — ABNORMAL LOW (ref 3.5–5.1)
Sodium: 136 mmol/L (ref 135–145)
Total Bilirubin: 0.7 mg/dL (ref 0.0–1.2)
Total Protein: 6.7 g/dL (ref 6.5–8.1)

## 2023-06-25 LAB — TYPE AND SCREEN
ABO/RH(D): B POS
Antibody Screen: NEGATIVE

## 2023-06-25 LAB — RPR: RPR Ser Ql: NONREACTIVE

## 2023-06-25 LAB — PROTEIN / CREATININE RATIO, URINE
Creatinine, Urine: 154 mg/dL
Protein Creatinine Ratio: 0.08 mg/mg{creat} (ref 0.00–0.15)
Total Protein, Urine: 13 mg/dL

## 2023-06-25 MED ORDER — OXYCODONE-ACETAMINOPHEN 5-325 MG PO TABS: 2.0000 | ORAL_TABLET | ORAL | Status: DC | PRN

## 2023-06-25 MED ORDER — FENTANYL-BUPIVACAINE-NACL 0.5-0.125-0.9 MG/250ML-% EP SOLN
12.0000 mL/h | EPIDURAL | Status: DC | PRN
  Filled 2023-06-25: qty 250

## 2023-06-25 MED ORDER — HYDROXYZINE HCL 50 MG PO TABS: 50.0000 mg | ORAL_TABLET | Freq: Four times a day (QID) | ORAL | Status: DC | PRN

## 2023-06-25 MED ORDER — INSULIN ASPART 100 UNIT/ML IJ SOLN
0.0000 [IU] | INTRAMUSCULAR | Status: DC
  Administered 2023-06-25 (×2): 1 [IU] via SUBCUTANEOUS

## 2023-06-25 MED ORDER — PHENYLEPHRINE 80 MCG/ML (10ML) SYRINGE FOR IV PUSH (FOR BLOOD PRESSURE SUPPORT): 80.0000 ug | PREFILLED_SYRINGE | INTRAVENOUS | Status: DC | PRN

## 2023-06-25 MED ORDER — ONDANSETRON HCL 4 MG/2ML IJ SOLN: 4.0000 mg | Freq: Four times a day (QID) | INTRAMUSCULAR | Status: DC | PRN

## 2023-06-25 MED ORDER — LACTATED RINGERS IV SOLN: 500.0000 mL | Freq: Once | INTRAVENOUS | Status: DC

## 2023-06-25 MED ORDER — FENTANYL CITRATE (PF) 100 MCG/2ML IJ SOLN
100.0000 ug | INTRAMUSCULAR | Status: DC | PRN
  Administered 2023-06-25 – 2023-06-26 (×2): 100 ug via INTRAVENOUS
  Filled 2023-06-25: qty 2

## 2023-06-25 MED ORDER — FENTANYL CITRATE (PF) 100 MCG/2ML IJ SOLN
INTRAMUSCULAR | Status: AC
  Filled 2023-06-25: qty 2

## 2023-06-25 MED ORDER — OXYTOCIN-SODIUM CHLORIDE 30-0.9 UT/500ML-% IV SOLN
1.0000 m[IU]/min | INTRAVENOUS | Status: DC
  Administered 2023-06-25: 2 m[IU]/min via INTRAVENOUS

## 2023-06-25 MED ORDER — OXYTOCIN BOLUS FROM INFUSION
333.0000 mL | Freq: Once | INTRAVENOUS | Status: AC
  Administered 2023-06-26: 333 mL via INTRAVENOUS

## 2023-06-25 MED ORDER — DIPHENHYDRAMINE HCL 50 MG/ML IJ SOLN: 12.5000 mg | INTRAMUSCULAR | Status: DC | PRN

## 2023-06-25 MED ORDER — MISOPROSTOL 25 MCG QUARTER TABLET
25.0000 ug | ORAL_TABLET | ORAL | Status: DC | PRN
  Administered 2023-06-25 (×2): 25 ug via VAGINAL
  Filled 2023-06-25 (×2): qty 1

## 2023-06-25 MED ORDER — PENICILLIN G POT IN DEXTROSE 60000 UNIT/ML IV SOLN
3.0000 10*6.[IU] | INTRAVENOUS | Status: DC
  Administered 2023-06-25 – 2023-06-26 (×6): 3 10*6.[IU] via INTRAVENOUS
  Filled 2023-06-25 (×7): qty 50

## 2023-06-25 MED ORDER — LIDOCAINE HCL (PF) 1 % IJ SOLN: 30.0000 mL | INTRAMUSCULAR | Status: DC | PRN

## 2023-06-25 MED ORDER — TERBUTALINE SULFATE 1 MG/ML IJ SOLN: 0.2500 mg | Freq: Once | INTRAMUSCULAR | Status: DC | PRN

## 2023-06-25 MED ORDER — VALACYCLOVIR HCL 500 MG PO TABS: 500.0000 mg | ORAL_TABLET | Freq: Two times a day (BID) | ORAL | Status: DC

## 2023-06-25 MED ORDER — EPHEDRINE 5 MG/ML INJ
10.0000 mg | INTRAVENOUS | Status: DC | PRN
Start: 2023-06-25 — End: 2023-06-26

## 2023-06-25 MED ORDER — LABETALOL HCL 200 MG PO TABS
400.0000 mg | ORAL_TABLET | Freq: Two times a day (BID) | ORAL | Status: DC
  Administered 2023-06-25 – 2023-06-27 (×5): 400 mg via ORAL
  Filled 2023-06-25 (×5): qty 2

## 2023-06-25 MED ORDER — LABETALOL HCL 200 MG PO TABS: 400.0000 mg | ORAL_TABLET | Freq: Two times a day (BID) | ORAL | Status: DC

## 2023-06-25 MED ORDER — SOD CITRATE-CITRIC ACID 500-334 MG/5ML PO SOLN: 30.0000 mL | ORAL | Status: DC | PRN

## 2023-06-25 MED ORDER — EPHEDRINE 5 MG/ML INJ: 10.0000 mg | INTRAVENOUS | Status: DC | PRN

## 2023-06-25 MED ORDER — SODIUM CHLORIDE 0.9 % IV SOLN
5.0000 10*6.[IU] | Freq: Once | INTRAVENOUS | Status: AC
  Administered 2023-06-25: 5 10*6.[IU] via INTRAVENOUS
  Filled 2023-06-25 (×2): qty 5

## 2023-06-25 MED ORDER — OXYCODONE-ACETAMINOPHEN 5-325 MG PO TABS: 1.0000 | ORAL_TABLET | ORAL | Status: DC | PRN

## 2023-06-25 MED ORDER — ACETAMINOPHEN 325 MG PO TABS: 650.0000 mg | ORAL_TABLET | ORAL | Status: DC | PRN

## 2023-06-25 MED ORDER — LACTATED RINGERS IV SOLN: INTRAVENOUS | Status: DC

## 2023-06-25 MED ORDER — PHENYLEPHRINE 80 MCG/ML (10ML) SYRINGE FOR IV PUSH (FOR BLOOD PRESSURE SUPPORT)
80.0000 ug | PREFILLED_SYRINGE | INTRAVENOUS | Status: DC | PRN
  Filled 2023-06-25: qty 10

## 2023-06-25 MED ORDER — LACTATED RINGERS IV SOLN
500.0000 mL | INTRAVENOUS | Status: DC | PRN
  Administered 2023-06-25: 500 mL via INTRAVENOUS

## 2023-06-25 MED ORDER — OXYTOCIN-SODIUM CHLORIDE 30-0.9 UT/500ML-% IV SOLN
2.5000 [IU]/h | INTRAVENOUS | Status: DC
  Filled 2023-06-25 (×2): qty 500

## 2023-06-25 NOTE — Progress Notes (Signed)
 Labor Progress Note  Patient doing well, has been changing positions, using ball.  Cervix 3.5 / 60 / -1, head well applied, which is was not at previous check.   AROM completed in standard fashion with return of clear fluid.   FHT cat 1  Continue IOL  Isidor Marek

## 2023-06-25 NOTE — Progress Notes (Signed)
 This patient's pitocin  was at a rate of 49mu/min since shift change (1910), changed by the previous nurse.

## 2023-06-25 NOTE — Progress Notes (Signed)
 Hypoglycemic Event  CBG: 57  Treatment: 4 oz juice/soda and 8 oz juice/soda  Symptoms: None  Follow-up CBG: Time:2202 CBG Result:76  Possible Reasons for Event: Inadequate meal intake     Kelli Mcpherson Kelli Mcpherson Kelli Mcpherson

## 2023-06-26 ENCOUNTER — Encounter (HOSPITAL_COMMUNITY): Payer: Self-pay | Admitting: Obstetrics and Gynecology

## 2023-06-26 DIAGNOSIS — Z3A37 37 weeks gestation of pregnancy: Secondary | ICD-10-CM | POA: Diagnosis not present

## 2023-06-26 LAB — CBC WITH DIFFERENTIAL/PLATELET
Abs Immature Granulocytes: 0.04 10*3/uL (ref 0.00–0.07)
Basophils Absolute: 0 10*3/uL (ref 0.0–0.1)
Basophils Relative: 0 %
Eosinophils Absolute: 0.1 10*3/uL (ref 0.0–0.5)
Eosinophils Relative: 1 %
HCT: 33.7 % — ABNORMAL LOW (ref 36.0–46.0)
Hemoglobin: 10.9 g/dL — ABNORMAL LOW (ref 12.0–15.0)
Immature Granulocytes: 0 %
Lymphocytes Relative: 21 %
Lymphs Abs: 2 10*3/uL (ref 0.7–4.0)
MCH: 27.7 pg (ref 26.0–34.0)
MCHC: 32.3 g/dL (ref 30.0–36.0)
MCV: 85.8 fL (ref 80.0–100.0)
Monocytes Absolute: 0.7 10*3/uL (ref 0.1–1.0)
Monocytes Relative: 8 %
Neutro Abs: 6.6 10*3/uL (ref 1.7–7.7)
Neutrophils Relative %: 70 %
Platelets: 188 10*3/uL (ref 150–400)
RBC: 3.93 MIL/uL (ref 3.87–5.11)
RDW: 13.4 % (ref 11.5–15.5)
WBC: 9.5 10*3/uL (ref 4.0–10.5)
nRBC: 0 % (ref 0.0–0.2)

## 2023-06-26 LAB — CBC
HCT: 32.6 % — ABNORMAL LOW (ref 36.0–46.0)
Hemoglobin: 10.5 g/dL — ABNORMAL LOW (ref 12.0–15.0)
MCH: 27.8 pg (ref 26.0–34.0)
MCHC: 32.2 g/dL (ref 30.0–36.0)
MCV: 86.2 fL (ref 80.0–100.0)
Platelets: 178 10*3/uL (ref 150–400)
RBC: 3.78 MIL/uL — ABNORMAL LOW (ref 3.87–5.11)
RDW: 13.5 % (ref 11.5–15.5)
WBC: 9.1 10*3/uL (ref 4.0–10.5)
nRBC: 0 % (ref 0.0–0.2)

## 2023-06-26 LAB — GLUCOSE, CAPILLARY
Glucose-Capillary: 160 mg/dL — ABNORMAL HIGH (ref 70–99)
Glucose-Capillary: 92 mg/dL (ref 70–99)
Glucose-Capillary: 116 mg/dL — ABNORMAL HIGH (ref 70–99)
Glucose-Capillary: 137 mg/dL — ABNORMAL HIGH (ref 70–99)

## 2023-06-26 MED ORDER — TRANEXAMIC ACID-NACL 1000-0.7 MG/100ML-% IV SOLN
1000.0000 mg | Freq: Once | INTRAVENOUS | Status: AC
  Administered 2023-06-26: 1000 mg via INTRAVENOUS
  Filled 2023-06-26: qty 100

## 2023-06-26 MED ORDER — ONDANSETRON HCL 4 MG/2ML IJ SOLN: 4.0000 mg | INTRAMUSCULAR | Status: DC | PRN

## 2023-06-26 MED ORDER — IBUPROFEN 600 MG PO TABS
600.0000 mg | ORAL_TABLET | Freq: Four times a day (QID) | ORAL | Status: DC
Start: 2023-06-26 — End: 2023-06-27
  Administered 2023-06-26 – 2023-06-27 (×6): 600 mg via ORAL
  Filled 2023-06-26 (×5): qty 1

## 2023-06-26 MED ORDER — PRENATAL MULTIVITAMIN CH
1.0000 | ORAL_TABLET | Freq: Every day | ORAL | Status: DC
  Administered 2023-06-26 – 2023-06-27 (×2): 1 via ORAL
  Filled 2023-06-26 (×2): qty 1

## 2023-06-26 MED ORDER — DIPHENHYDRAMINE HCL 25 MG PO CAPS
25.0000 mg | ORAL_CAPSULE | Freq: Four times a day (QID) | ORAL | Status: DC | PRN
Start: 2023-06-26 — End: 2023-06-27

## 2023-06-26 MED ORDER — WITCH HAZEL-GLYCERIN EX PADS
1.0000 | MEDICATED_PAD | CUTANEOUS | Status: DC | PRN
Start: 2023-06-26 — End: 2023-06-27

## 2023-06-26 MED ORDER — TETANUS-DIPHTH-ACELL PERTUSSIS 5-2.5-18.5 LF-MCG/0.5 IM SUSY: 0.5000 mL | PREFILLED_SYRINGE | Freq: Once | INTRAMUSCULAR | Status: DC

## 2023-06-26 MED ORDER — COCONUT OIL OIL
1.0000 | TOPICAL_OIL | Status: DC | PRN
  Administered 2023-06-27 (×2): 1 via TOPICAL

## 2023-06-26 MED ORDER — SIMETHICONE 80 MG PO CHEW
80.0000 mg | CHEWABLE_TABLET | ORAL | Status: DC | PRN
Start: 2023-06-26 — End: 2023-06-27

## 2023-06-26 MED ORDER — BENZOCAINE-MENTHOL 20-0.5 % EX AERO: 1.0000 | INHALATION_SPRAY | CUTANEOUS | Status: DC | PRN

## 2023-06-26 MED ORDER — ZOLPIDEM TARTRATE 5 MG PO TABS: 5.0000 mg | ORAL_TABLET | Freq: Every evening | ORAL | Status: DC | PRN

## 2023-06-26 MED ORDER — SENNOSIDES-DOCUSATE SODIUM 8.6-50 MG PO TABS
2.0000 | ORAL_TABLET | ORAL | Status: DC
  Administered 2023-06-26 – 2023-06-27 (×2): 2 via ORAL
  Filled 2023-06-26 (×2): qty 2

## 2023-06-26 MED ORDER — ONDANSETRON HCL 4 MG PO TABS: 4.0000 mg | ORAL_TABLET | ORAL | Status: DC | PRN

## 2023-06-26 MED ORDER — ACETAMINOPHEN 325 MG PO TABS
650.0000 mg | ORAL_TABLET | ORAL | Status: DC | PRN
  Administered 2023-06-26 (×2): 650 mg via ORAL
  Filled 2023-06-26 (×2): qty 2

## 2023-06-26 MED ORDER — DIBUCAINE (PERIANAL) 1 % EX OINT
1.0000 | TOPICAL_OINTMENT | CUTANEOUS | Status: DC | PRN
Start: 2023-06-26 — End: 2023-06-27

## 2023-06-26 NOTE — Lactation Note (Signed)
 This note was copied from a baby's chart. Lactation Consultation Note  Patient Name: Kelli Mcpherson [id number] Date: 06/26/2023 Age:27 hours Reason for consult: Initial assessment;Mother's request;Early term 37-38.6wks;Nipple pain/trauma;Maternal endocrine disorder (hx HSV)  P3- MOB requested a pump because it is painful when infant latches. MOB is concerned that infant may have a tongue tie because of how painful his latches are. MOB reports that her other two children would not cause pain like this when they latched. With MOB's permission, LC performed an oral assessment. LC did note that infant has a lingual frenulum that attaches almost to the tip of the tongue causing an indentation in the middle of the tip of his tongue. LC provided MOB with the physician resource. LC provided MOB with a manual pump and size 24 mm flanges to help with proper breast stimulation. LC encouraged MOB to pump after every other breastfeeding to help with stimulation. MOB denies having further questions or concerns. Infant had just nursed 1 hr ago and was uninterested in nursing at this time. LC encouraged MOB to call out for a latch assessment.   LC reviewed the first 24 hr birthday nap, day 2 cluster feeding, feeding infant on cue 8-12x in 24 hrs, not allowing infant to go over 3 hrs without a feeding, CDC milk storage guidelines, LC services handout and engorgement/breast care. LC encouraged MOB to call for further assistance as needed. LC sent [facility] referral today.  Maternal Data Has patient been taught Hand Expression?: No Does the patient have breastfeeding experience prior to this delivery?: Yes How long did the patient breastfeed?: a few weeks with first child and 11 months with second child  Feeding Mother's Current Feeding Choice: Breast Milk and Formula  Lactation Tools Discussed/Used Tools: Pump;Flanges;Coconut oil Flange Size: 24 Breast pump type: Manual Pump Education: Setup, frequency, and  cleaning;Milk Storage Reason for Pumping: MOB request Pumping frequency: 15-20 min every 3 hrs  Interventions Interventions: Breast feeding basics reviewed;Coconut oil;Hand pump;Education;LC Services brochure  Discharge Discharge Education: Engorgement and breast care;Warning signs for feeding baby;Outpatient recommendation Pump: Manual;Referral sent for [facility] Pump  Consult Status Consult Status: Follow-up Date: 06/27/23 Follow-up type: In-patient    Vernette Goo BS, IBCLC 06/26/2023, 5:53 PM

## 2023-06-26 NOTE — Progress Notes (Signed)
 Writer notified provider patients blood sugar was 160. She has eaten pizza 30 minutes prior. No new orders given .

## 2023-06-27 LAB — GLUCOSE, CAPILLARY
Glucose-Capillary: 114 mg/dL — ABNORMAL HIGH (ref 70–99)
Glucose-Capillary: 121 mg/dL — ABNORMAL HIGH (ref 70–99)

## 2023-06-27 NOTE — Progress Notes (Addendum)
 Doing well. No c/o. AFVSS Patient desires to go home today. BP has been normal. > 24 hours since delivery. BS are in MFM's requested range (<40-80). However, some elevations. Patient and Dr. Kandis Ormond have a pp plan to monitor this and she will follow her advice. D/W patient female infant circumcision, risks/benefits reviewed. All questions answered.   Kelli Lyons MD

## 2023-06-27 NOTE — Discharge Summary (Signed)
 Postpartum Discharge Summary  Date of Service updated 06/27/23     Patient Name: Kelli Mcpherson DOB: April 23, 1995 MRN: [id number]  Date of admission: 06/25/2023 Delivery date:06/26/2023 Delivering provider: Jeannett Million A Date of discharge: 06/27/2023  Admitting diagnosis: Hypertension in pregnancy [O16.9] Intrauterine pregnancy: [redacted]w[redacted]d     Secondary diagnosis:  Principal Problem:   Hypertension in pregnancy  Additional problems: T2DM    Discharge diagnosis: Term Pregnancy Delivered, CHTN, and Type 2 DM                                              Post partum procedures:None Augmentation: AROM, Pitocin , and Cytotec  Complications: None  Hospital course: Induction of Labor With Vaginal Delivery   28 y.o. yo [phone] at [redacted]w[redacted]d was admitted to the hospital 06/25/2023 for induction of labor.  Indication for induction: TYPE 2 DM and cHTN.  Patient had an labor course complicated by none Membrane Rupture Time/Date: 10:53 PM,06/25/2023  Delivery Method:Vaginal, Spontaneous Operative Delivery:N/A Episiotomy: None Lacerations:  None Details of delivery can be found in separate delivery note.  Patient had a postpartum course complicated by BS elevation not requiring treatment per Dr. Kandis Ormond. Patient is discharged home 06/27/23.  Newborn Data: Birth date:06/26/2023 Birth time:3:07 AM Gender:Female Living status:Living Apgars:8 ,9  Weight:3610 g  Magnesium  Sulfate received: No BMZ received: No Rhophylac:N/A MMR:N/A T-DaP:Given prenatally Immunizations administered: Immunization History  Administered Date(s) Administered   DTaP 01/26/1997, 03/29/1997, 06/05/1997, 02/27/1998   HIB (PRP-OMP) 01/26/1997, 03/29/1997, 06/05/1997, 02/27/1998   HPV Quadrivalent 10/01/2007, 07/05/2008, 02/09/2009   Hepatitis A 10/01/2007, 07/05/2008   Hepatitis B 11/10/96, 01/26/1997, 09/07/1997   IPV 01/26/1997, 03/29/1997, 11/24/1997, 10/08/2007   Influenza Nasal 12/09/2008, 11/17/2009, 10/26/2010,  11/13/2011   Influenza,inj,Quad PF,6+ Mos 10/05/2020, 02/14/2022   Influenza-Unspecified 01/02/2006, 12/29/2006, 10/23/2007   MMR 11/24/1997, 10/08/2007   Meningococcal Conjugate 07/05/2008, 08/01/2015   Td 10/01/2007   Tdap 10/01/2007, 02/14/2022   Varicella 10/24/1999, 10/08/2007    Physical exam  Vitals:   06/26/23 1851 06/26/23 1950 06/27/23 0626 06/27/23 0902  BP: 128/72 123/76 (!) 112/50 132/70  Pulse: 83 76 68 82  Resp: 18 18 18    Temp: 99.9 F (37.7 C) 98.9 F (37.2 C) 97.9 F (36.6 C)   TempSrc: Oral Oral Oral   SpO2: 98%  98%   Weight:      Height:       General: alert, cooperative, and no distress Lochia: appropriate Uterine Fundus: firm Incision: N/A DVT Evaluation: No evidence of DVT seen on physical exam. Labs: Lab Results  Component Value Date   WBC 9.1 06/26/2023   HGB 10.5 (L) 06/26/2023   HCT 32.6 (L) 06/26/2023   MCV 86.2 06/26/2023   PLT 178 06/26/2023      Latest Ref Rng & Units 06/25/2023    1:30 AM  CMP  Glucose 70 - 99 mg/dL 78   BUN 6 - 20 mg/dL 7   Creatinine 1.47 - 8.29 mg/dL 5.62   Sodium 130 - 865 mmol/L 136   Potassium 3.5 - 5.1 mmol/L 3.3   Chloride 98 - 111 mmol/L 103   CO2 22 - 32 mmol/L 20   Calcium  8.9 - 10.3 mg/dL 9.5   Total Protein 6.5 - 8.1 g/dL 6.7   Total Bilirubin 0.0 - 1.2 mg/dL 0.7   Alkaline Phos 38 - 126 U/L 116   AST 15 -  41 U/L 14   ALT 0 - 44 U/L 11    Edinburgh Score:    06/27/2023    1:01 AM  Edinburgh Postnatal Depression Scale Screening Tool  I have been able to laugh and see the funny side of things. 0  I have looked forward with enjoyment to things. 0  I have blamed myself unnecessarily when things went wrong. 0  I have been anxious or worried for no good reason. 0  I have felt scared or panicky for no good reason. 0  Things have been getting on top of me. 0  I have been so unhappy that I have had difficulty sleeping. 0  I have felt sad or miserable. 0  I have been so unhappy that I have been  crying. 0  The thought of harming myself has occurred to me. 0  Edinburgh Postnatal Depression Scale Total 0      After visit meds:  Allergies as of 06/27/2023   No Known Allergies      Medication List     STOP taking these medications    aspirin EC 81 MG tablet   insulin  glargine 100 UNIT/ML injection Commonly known as: LANTUS    insulin  lispro 100 UNIT/ML injection Commonly known as: HUMALOG    Insulin  Syringe-Needle U-100 29G X 1/2" 0.3 ML Misc   ondansetron  4 MG disintegrating tablet Commonly known as: ZOFRAN -ODT   promethazine  12.5 MG tablet Commonly known as: PHENERGAN    scopolamine  1 MG/3DAYS Commonly known as: TRANSDERM-SCOP       TAKE these medications    Baqsimi  One Pack 3 MG/DOSE Powd Generic drug: Glucagon  Place 1 Device into the nose as needed (Low blood sugar with impaired consciousness).   [facility] Prenatal Gummy 0.4 MG Chew Chew 1 Units by mouth daily.   Dexcom G7 Receiver Devi Use to monitor BG continuously   Dexcom G7 Sensor Misc CHANGE SENSOR EVERY 10 DAYS   labetalol  200 MG tablet Commonly known as: NORMODYNE  Take 1 tablet (200 mg total) by mouth 2 (two) times daily. What changed: how much to take   valACYclovir  500 MG tablet Commonly known as: Valtrex  Take 1 tablet (500 mg total) by mouth 2 (two) times daily. Take for 3 days with onset of outbreak         Discharge home in stable condition Infant Feeding: Bottle and Breast Infant Disposition:home with mother Discharge instruction: per After Visit Summary and Postpartum booklet. Activity: Advance as tolerated. Pelvic rest for 6 weeks.  Diet: carb modified diet Anticipated Birth Control: IUD Postpartum Appointment:6 weeks Additional Postpartum F/U: BP check 2-3 days Future Appointments: Future Appointments  Date Time Provider Department Center  09/16/2023 10:30 AM Dellar Fenton, DO [facility] None   Follow up Visit:      06/27/2023 Concepcion Deck, MD

## 2023-06-30 ENCOUNTER — Encounter (HOSPITAL_COMMUNITY): Payer: Self-pay | Admitting: Obstetrics and Gynecology

## 2023-06-30 ENCOUNTER — Inpatient Hospital Stay (HOSPITAL_COMMUNITY)
Admission: AD | Admit: 2023-06-30 | Discharge: 2023-06-30 | Disposition: A | Discharge status: Home / Self Care | Attending: Obstetrics and Gynecology | Admitting: Obstetrics and Gynecology

## 2023-06-30 DIAGNOSIS — O9123 Nonpurulent mastitis associated with lactation: Secondary | ICD-10-CM | POA: Diagnosis not present

## 2023-06-30 DIAGNOSIS — N644 Mastodynia: Secondary | ICD-10-CM | POA: Diagnosis present

## 2023-06-30 DIAGNOSIS — L22 Diaper dermatitis: Secondary | ICD-10-CM | POA: Diagnosis not present

## 2023-06-30 MED ORDER — DICLOXACILLIN SODIUM 500 MG PO CAPS: 500.0000 mg | ORAL_CAPSULE | Freq: Four times a day (QID) | ORAL | 0 refills | Status: AC

## 2023-06-30 MED ORDER — IBUPROFEN 800 MG PO TABS: 800.0000 mg | ORAL_TABLET | Freq: Three times a day (TID) | ORAL | 0 refills | Status: AC

## 2023-06-30 NOTE — MAU Note (Signed)
 Was induced on Thursday am  Pt says she  del vag on Fri 06-26-2023 [facility] home on Sat 06-27-2023- all ok Pt says she pumps and formula- baby is tongue tied - feed with bottle .  Both breast 2 oz each time .  She has sharp pain in left breast - started Sunday night . Says feet are swollen .

## 2023-06-30 NOTE — MAU Provider Note (Signed)
 History     CSN: [id number]  Arrival date and time: 06/30/23 0220   Event Date/Time   First Provider Initiated Contact with Patient 06/30/2023  3:02 AM   Chief Complaint  Patient presents with   Breast Pain    HPI  Kelli Mcpherson is a 28 y.o. G3P2103 at [redacted]w[redacted]d who presents to the MAU for left breast pain. Patient is PPD#4 after SVD to term infant on 6/6. Her baby has a tongue-tie so she has been exclusively pumping. She reports onset of left breast pain, which started 2 days ago. Reports complete emptying -- has been pumping and getting about 2 oz of thicker breast milk. She has had plugged ducts before, but didn't really feel like that is what she was feeling with this episode. No fevers, chills, malaise, body aches.  Past Medical History:  Diagnosis Date   Chlamydia    COVID-19    DKA (diabetic ketoacidosis) (HCC) 05/29/2019   Eczema    Essential hypertension, benign 09/30/2018   Genital herpes    Gestational diabetes    Gonorrhea    Pre-eclampsia 06/08/2019   Preeclampsia    Type 2 diabetes mellitus (HCC)    UTI (urinary tract infection)    Vaginal Pap smear, abnormal     Past Surgical History:  Procedure Laterality Date   WISDOM TOOTH EXTRACTION      Family History  Problem Relation Age of Onset   Diabetes Mother    Hypertension Mother    Hypertension Father    Asthma Neg Hx    Cancer Neg Hx    Birth defects Neg Hx    Heart disease Neg Hx    Stroke Neg Hx     Social History   Tobacco Use   Smoking status: Never   Smokeless tobacco: Never   Tobacco comments:    Patient occasionally smokes hookah   Vaping Use   Vaping status: Former   Substances: Nicotine, Flavoring  Substance Use Topics   Alcohol use: Not Currently    Comment: socially   Drug use: Never    Allergies: No Known Allergies  No medications prior to admission.    ROS reviewed and pertinent positives and negatives as documented in HPI.  Physical Exam   Blood pressure (!)  142/64, pulse 76, temperature 98.9 F (37.2 C), temperature source Oral, resp. rate 12, weight (!) 145.3 kg, last menstrual period 10/09/2022, unknown if currently breastfeeding.  Physical Exam Exam conducted with a chaperone present.  Constitutional:      General: She is not in acute distress.    Appearance: Normal appearance. She is not ill-appearing.  HENT:     Head: Normocephalic and atraumatic.  Cardiovascular:     Rate and Rhythm: Normal rate.  Pulmonary:     Effort: Pulmonary effort is normal.     Breath sounds: Normal breath sounds.  Abdominal:     Palpations: Abdomen is soft.     Tenderness: There is no abdominal tenderness. There is no guarding.  Musculoskeletal:        General: Normal range of motion.  Skin:    General: Skin is warm and dry.     Findings: No rash.     Comments: +warmth and pain of lateral aspect of left breast between 3:00 and 6:00, no nodules or masses noted  Neurological:     General: No focal deficit present.     Mental Status: She is alert and oriented to person, place, and time.  MAU Course  Procedures  MDM 28 y.o. [id number] at [redacted]w[redacted]d presenting for left breast pain postpartum, pt exclusively pumping. Exam c/w lactational mastitis. Her vital signs are stable. Discussed conservative management of lactational mastitis, importance of cleaning breast pump and supplies, role of abx. All questions answered. Incidentally pt noted to have mild range BP, though has cHTN, and is asymptomatic -- rec f/up w primary OB. Discharged in stable condition.    Assessment and Plan     ICD-10-CM   1. Mastitis associated with lactation  O91.23     Rx for Dicloxacillin  and Ibuprofen  sent Rec frequent pumping, complete emptying, cleaning breast pump supplies Consider sunflower lechitin to thin breastmilk Consider lactation consult if symptoms do not improve w above Rec close f/up w primary OB for further BP management and f/up of mastitis   Melanie Spires,  MD OB Fellow, Faculty Practice [facility], Center for [facility]  06/30/2023, 4:29 AM

## 2023-06-30 NOTE — Discharge Instructions (Signed)
 Ice NSAIDs Acetaminophen  Sunflower or soy lecithin 5 to 10 g daily by mouth can help reduce inflammation in ducts

## 2023-07-01 DIAGNOSIS — E119 Type 2 diabetes mellitus without complications: Secondary | ICD-10-CM | POA: Diagnosis not present

## 2023-07-01 DIAGNOSIS — Z8751 Personal history of pre-term labor: Secondary | ICD-10-CM | POA: Diagnosis not present

## 2023-07-01 DIAGNOSIS — Z8759 Personal history of other complications of pregnancy, childbirth and the puerperium: Secondary | ICD-10-CM | POA: Diagnosis not present

## 2023-07-01 DIAGNOSIS — O0990 Supervision of high risk pregnancy, unspecified, unspecified trimester: Secondary | ICD-10-CM | POA: Diagnosis not present

## 2023-07-01 DIAGNOSIS — I10 Essential (primary) hypertension: Secondary | ICD-10-CM | POA: Diagnosis not present

## 2023-07-08 ENCOUNTER — Telehealth (HOSPITAL_COMMUNITY): Payer: Self-pay | Admitting: *Deleted

## 2023-07-08 NOTE — Telephone Encounter (Signed)
 07/08/2023  Name: Kelli Mcpherson MRN: [id number] DOB: Feb 27, 1995  Reason for Call:  Transition of Care Hospital Discharge Call  Contact Status: Patient Contact Status: Complete  Language assistant needed:          Follow-Up Questions: Do You Have Any Concerns About Your Health As You Heal From Delivery?: No Do You Have Any Concerns About Your Infants Health?: No  Edinburgh Postnatal Depression Scale:  In the Past 7 Days: I have been able to laugh and see the funny side of things.: As much as I always could I have looked forward with enjoyment to things.: As much as I ever did I have blamed myself unnecessarily when things went wrong.: No, never I have been anxious or worried for no good reason.: No, not at all I have felt scared or panicky for no good reason.: No, not at all Things have been getting on top of me.: No, I have been coping as well as ever I have been so unhappy that I have had difficulty sleeping.: Not at all I have felt sad or miserable.: No, not at all I have been so unhappy that I have been crying.: No, never The thought of harming myself has occurred to me.: Never [name] Postnatal Depression Scale Total: 0  PHQ2-9 Depression Scale:     Discharge Follow-up: Edinburgh score requires follow up?: No Patient was advised of the following resources:: Breastfeeding Support Group, Support Group  Post-discharge interventions: Reviewed Newborn Safe Sleep Practices  Signature Julien Odor, RN, 07/08/23, [phone]

## 2023-07-31 DIAGNOSIS — I10 Essential (primary) hypertension: Secondary | ICD-10-CM | POA: Diagnosis not present

## 2023-07-31 DIAGNOSIS — O0990 Supervision of high risk pregnancy, unspecified, unspecified trimester: Secondary | ICD-10-CM | POA: Diagnosis not present

## 2023-07-31 DIAGNOSIS — Z8759 Personal history of other complications of pregnancy, childbirth and the puerperium: Secondary | ICD-10-CM | POA: Diagnosis not present

## 2023-07-31 DIAGNOSIS — E119 Type 2 diabetes mellitus without complications: Secondary | ICD-10-CM | POA: Diagnosis not present

## 2023-07-31 DIAGNOSIS — Z8751 Personal history of pre-term labor: Secondary | ICD-10-CM | POA: Diagnosis not present

## 2023-08-06 DIAGNOSIS — I10 Essential (primary) hypertension: Secondary | ICD-10-CM | POA: Diagnosis not present

## 2023-08-06 DIAGNOSIS — Z309 Encounter for contraceptive management, unspecified: Secondary | ICD-10-CM | POA: Diagnosis not present

## 2023-08-06 DIAGNOSIS — E119 Type 2 diabetes mellitus without complications: Secondary | ICD-10-CM | POA: Diagnosis not present

## 2023-08-06 DIAGNOSIS — Z1389 Encounter for screening for other disorder: Secondary | ICD-10-CM | POA: Diagnosis not present

## 2023-08-20 ENCOUNTER — Ambulatory Visit: Admitting: "Endocrinology

## 2023-09-02 DIAGNOSIS — N898 Other specified noninflammatory disorders of vagina: Secondary | ICD-10-CM | POA: Diagnosis not present

## 2023-09-02 DIAGNOSIS — Z3202 Encounter for pregnancy test, result negative: Secondary | ICD-10-CM | POA: Diagnosis not present

## 2023-09-02 DIAGNOSIS — I10 Essential (primary) hypertension: Secondary | ICD-10-CM | POA: Diagnosis not present

## 2023-09-16 ENCOUNTER — Ambulatory Visit: Admitting: Dermatology

## 2023-09-17 ENCOUNTER — Telehealth: Payer: Self-pay | Admitting: "Endocrinology

## 2023-09-17 DIAGNOSIS — E119 Type 2 diabetes mellitus without complications: Secondary | ICD-10-CM

## 2023-09-17 NOTE — Telephone Encounter (Signed)
 MEDICATION: Dexcom Sensors   PHARMACY:  [facility] [address]  HAS THE PATIENT CONTACTED THEIR PHARMACY?  Yes,stated it needed a PA from provider  LAST REFILL:  @@LASTREFILL @  IS THIS A 90 DAY SUPPLY : Yes  IS PATIENT OUT OF MEDICATION: Yes  IF NOT; HOW MUCH IS LEFT:   LAST APPOINTMENT DATE: @7 /31/2025  NEXT APPOINTMENT DATE:@9 /03/2023  DO WE HAVE YOUR PERMISSION TO LEAVE A DETAILED MESSAGE?: Yes  OTHER COMMENTS:    **Let patient know to contact pharmacy at the end of the day to make sure medication is ready. **  ** Please notify patient to allow 48-72 hours to process**  **Encourage patient to contact the pharmacy for refills or they can request refills through [facility]**

## 2023-09-18 MED ORDER — DEXCOM G7 SENSOR MISC: 2 refills | Status: AC

## 2023-09-18 NOTE — Telephone Encounter (Signed)
 Requested Prescriptions   Signed Prescriptions Disp Refills   Continuous Glucose Sensor (DEXCOM G7 SENSOR) MISC 3 each 2    Sig: Change sensor every 10 days    Authorizing Provider: DARTHA ERNST    Ordering User: ARLOA JEOFFREY SAILOR

## 2023-09-23 ENCOUNTER — Ambulatory Visit: Admitting: "Endocrinology

## 2023-11-19 DIAGNOSIS — N898 Other specified noninflammatory disorders of vagina: Secondary | ICD-10-CM | POA: Diagnosis not present

## 2023-11-19 DIAGNOSIS — Z131 Encounter for screening for diabetes mellitus: Secondary | ICD-10-CM | POA: Diagnosis not present

## 2023-11-24 DIAGNOSIS — L02215 Cutaneous abscess of perineum: Secondary | ICD-10-CM | POA: Diagnosis not present

## 2023-11-24 DIAGNOSIS — R300 Dysuria: Secondary | ICD-10-CM | POA: Diagnosis not present

## 2023-11-27 DIAGNOSIS — N9089 Other specified noninflammatory disorders of vulva and perineum: Secondary | ICD-10-CM | POA: Diagnosis not present

## 2023-12-08 DIAGNOSIS — N9089 Other specified noninflammatory disorders of vulva and perineum: Secondary | ICD-10-CM | POA: Diagnosis not present

## 2023-12-21 DIAGNOSIS — R1030 Lower abdominal pain, unspecified: Secondary | ICD-10-CM | POA: Diagnosis not present

## 2023-12-21 DIAGNOSIS — N898 Other specified noninflammatory disorders of vagina: Secondary | ICD-10-CM | POA: Diagnosis not present

## 2023-12-21 DIAGNOSIS — R82998 Other abnormal findings in urine: Secondary | ICD-10-CM | POA: Diagnosis not present

## 2023-12-30 DIAGNOSIS — L0292 Furuncle, unspecified: Secondary | ICD-10-CM | POA: Diagnosis not present

## 2023-12-30 DIAGNOSIS — N771 Vaginitis, vulvitis and vulvovaginitis in diseases classified elsewhere: Secondary | ICD-10-CM | POA: Diagnosis not present

## 2023-12-30 DIAGNOSIS — N760 Acute vaginitis: Secondary | ICD-10-CM | POA: Diagnosis not present

## 2023-12-30 DIAGNOSIS — Z3041 Encounter for surveillance of contraceptive pills: Secondary | ICD-10-CM | POA: Diagnosis not present
